# Patient Record
Sex: Male | Born: 1940 | Race: White | Hispanic: No | Marital: Married | Smoking: Former smoker
Health system: Southern US, Community
[De-identification: ages and names within clinical notes are randomized; demographics above are authoritative.]

## PROBLEM LIST (undated history)

## (undated) DIAGNOSIS — R339 Retention of urine, unspecified: Secondary | ICD-10-CM

## (undated) DIAGNOSIS — M7552 Bursitis of left shoulder: Secondary | ICD-10-CM

## (undated) DIAGNOSIS — R1900 Intra-abdominal and pelvic swelling, mass and lump, unspecified site: Secondary | ICD-10-CM

## (undated) DIAGNOSIS — I10 Essential (primary) hypertension: Secondary | ICD-10-CM

## (undated) DIAGNOSIS — M1990 Unspecified osteoarthritis, unspecified site: Secondary | ICD-10-CM

## (undated) DIAGNOSIS — C439 Malignant melanoma of skin, unspecified: Secondary | ICD-10-CM

## (undated) DIAGNOSIS — Z860100 Personal history of colon polyps, unspecified: Secondary | ICD-10-CM

## (undated) DIAGNOSIS — N390 Urinary tract infection, site not specified: Secondary | ICD-10-CM

## (undated) DIAGNOSIS — I639 Cerebral infarction, unspecified: Secondary | ICD-10-CM

## (undated) DIAGNOSIS — R3129 Other microscopic hematuria: Secondary | ICD-10-CM

## (undated) DIAGNOSIS — I472 Ventricular tachycardia, unspecified: Secondary | ICD-10-CM

## (undated) DIAGNOSIS — E785 Hyperlipidemia, unspecified: Secondary | ICD-10-CM

## (undated) DIAGNOSIS — G459 Transient cerebral ischemic attack, unspecified: Secondary | ICD-10-CM

## (undated) DIAGNOSIS — I509 Heart failure, unspecified: Secondary | ICD-10-CM

## (undated) DIAGNOSIS — N400 Enlarged prostate without lower urinary tract symptoms: Secondary | ICD-10-CM

## (undated) DIAGNOSIS — E119 Type 2 diabetes mellitus without complications: Secondary | ICD-10-CM

## (undated) DIAGNOSIS — R0609 Other forms of dyspnea: Secondary | ICD-10-CM

## (undated) DIAGNOSIS — R972 Elevated prostate specific antigen [PSA]: Secondary | ICD-10-CM

## (undated) HISTORY — DX: Heart failure, unspecified: I50.9

## (undated) HISTORY — DX: Essential (primary) hypertension: I10

## (undated) HISTORY — DX: Ventricular tachycardia: I47.2

## (undated) HISTORY — DX: Other forms of dyspnea: R06.09

## (undated) HISTORY — DX: Other microscopic hematuria: R31.29

## (undated) HISTORY — DX: Benign prostatic hyperplasia without lower urinary tract symptoms: N40.0

## (undated) HISTORY — DX: Malignant melanoma of skin, unspecified: C43.9

## (undated) HISTORY — DX: Type 2 diabetes mellitus without complications: E11.9

## (undated) HISTORY — DX: Hyperlipidemia, unspecified: E78.5

## (undated) HISTORY — DX: Transient cerebral ischemic attack, unspecified: G45.9

## (undated) HISTORY — DX: Personal history of colonic polyps: Z86.010

## (undated) HISTORY — PX: JOINT REPLACEMENT: SHX530

## (undated) HISTORY — DX: Personal history of colon polyps, unspecified: Z86.0100

## (undated) HISTORY — DX: Retention of urine, unspecified: R33.9

## (undated) HISTORY — DX: Bursitis of left shoulder: M75.52

## (undated) HISTORY — PX: REPLACEMENT TOTAL KNEE: SUR1224

## (undated) HISTORY — DX: Ventricular tachycardia, unspecified: I47.20

## (undated) HISTORY — PX: VASECTOMY: SHX75

## (undated) HISTORY — DX: Intra-abdominal and pelvic swelling, mass and lump, unspecified site: R19.00

## (undated) HISTORY — PX: CARDIOVERSION: SHX1299

## (undated) HISTORY — DX: Elevated prostate specific antigen (PSA): R97.20

## (undated) HISTORY — DX: Unspecified osteoarthritis, unspecified site: M19.90

---

## 2003-09-20 ENCOUNTER — Other Ambulatory Visit: Payer: Self-pay

## 2005-05-15 ENCOUNTER — Ambulatory Visit: Payer: Self-pay | Admitting: Internal Medicine

## 2005-05-24 ENCOUNTER — Ambulatory Visit: Payer: Self-pay | Admitting: Internal Medicine

## 2005-06-08 ENCOUNTER — Ambulatory Visit: Payer: Self-pay | Admitting: Internal Medicine

## 2005-06-19 ENCOUNTER — Ambulatory Visit: Payer: Self-pay | Admitting: Internal Medicine

## 2005-06-28 ENCOUNTER — Ambulatory Visit: Payer: Self-pay | Admitting: Internal Medicine

## 2005-07-16 ENCOUNTER — Ambulatory Visit: Payer: Self-pay | Admitting: Internal Medicine

## 2005-08-16 ENCOUNTER — Ambulatory Visit: Payer: Self-pay | Admitting: Internal Medicine

## 2005-11-05 ENCOUNTER — Ambulatory Visit: Payer: Self-pay | Admitting: Internal Medicine

## 2005-11-07 ENCOUNTER — Ambulatory Visit: Payer: Self-pay | Admitting: Internal Medicine

## 2005-11-13 ENCOUNTER — Ambulatory Visit: Payer: Self-pay | Admitting: Internal Medicine

## 2006-03-13 ENCOUNTER — Ambulatory Visit: Payer: Self-pay | Admitting: Internal Medicine

## 2006-03-14 ENCOUNTER — Ambulatory Visit: Payer: Self-pay | Admitting: Internal Medicine

## 2006-03-16 ENCOUNTER — Ambulatory Visit: Payer: Self-pay | Admitting: Internal Medicine

## 2006-04-30 ENCOUNTER — Other Ambulatory Visit: Payer: Self-pay

## 2006-04-30 ENCOUNTER — Inpatient Hospital Stay: Payer: Self-pay | Admitting: Internal Medicine

## 2006-07-02 ENCOUNTER — Ambulatory Visit: Payer: Self-pay | Admitting: Internal Medicine

## 2006-07-16 ENCOUNTER — Ambulatory Visit: Payer: Self-pay | Admitting: Internal Medicine

## 2006-10-15 ENCOUNTER — Ambulatory Visit: Payer: Self-pay | Admitting: Internal Medicine

## 2006-10-30 ENCOUNTER — Ambulatory Visit: Payer: Self-pay | Admitting: Internal Medicine

## 2006-11-14 ENCOUNTER — Ambulatory Visit: Payer: Self-pay | Admitting: Internal Medicine

## 2007-02-14 ENCOUNTER — Ambulatory Visit: Payer: Self-pay | Admitting: Internal Medicine

## 2007-02-26 ENCOUNTER — Ambulatory Visit: Payer: Self-pay | Admitting: Internal Medicine

## 2007-03-17 ENCOUNTER — Ambulatory Visit: Payer: Self-pay | Admitting: Internal Medicine

## 2007-06-16 ENCOUNTER — Ambulatory Visit: Payer: Self-pay | Admitting: Internal Medicine

## 2007-06-30 ENCOUNTER — Ambulatory Visit: Payer: Self-pay | Admitting: Internal Medicine

## 2007-07-04 ENCOUNTER — Ambulatory Visit: Payer: Self-pay | Admitting: Unknown Physician Specialty

## 2007-07-17 ENCOUNTER — Ambulatory Visit: Payer: Self-pay | Admitting: Internal Medicine

## 2007-12-15 ENCOUNTER — Ambulatory Visit: Payer: Self-pay | Admitting: Internal Medicine

## 2007-12-29 ENCOUNTER — Ambulatory Visit: Payer: Self-pay | Admitting: Internal Medicine

## 2008-01-14 ENCOUNTER — Ambulatory Visit: Payer: Self-pay | Admitting: Internal Medicine

## 2008-06-07 ENCOUNTER — Ambulatory Visit: Payer: Self-pay | Admitting: Internal Medicine

## 2008-07-06 ENCOUNTER — Ambulatory Visit: Payer: Self-pay | Admitting: Internal Medicine

## 2008-07-07 ENCOUNTER — Ambulatory Visit: Payer: Self-pay | Admitting: Internal Medicine

## 2008-07-16 ENCOUNTER — Ambulatory Visit: Payer: Self-pay | Admitting: Internal Medicine

## 2008-08-16 ENCOUNTER — Ambulatory Visit: Payer: Self-pay | Admitting: Internal Medicine

## 2008-12-14 ENCOUNTER — Ambulatory Visit: Payer: Self-pay | Admitting: Internal Medicine

## 2008-12-28 ENCOUNTER — Ambulatory Visit: Payer: Self-pay | Admitting: Internal Medicine

## 2008-12-31 ENCOUNTER — Ambulatory Visit: Payer: Self-pay | Admitting: Internal Medicine

## 2009-01-13 ENCOUNTER — Ambulatory Visit: Payer: Self-pay | Admitting: Internal Medicine

## 2009-06-15 ENCOUNTER — Ambulatory Visit: Payer: Self-pay | Admitting: Internal Medicine

## 2009-07-01 ENCOUNTER — Ambulatory Visit: Payer: Self-pay | Admitting: Internal Medicine

## 2009-07-16 ENCOUNTER — Ambulatory Visit: Payer: Self-pay | Admitting: Internal Medicine

## 2009-08-16 ENCOUNTER — Ambulatory Visit: Payer: Self-pay | Admitting: Internal Medicine

## 2009-12-14 ENCOUNTER — Ambulatory Visit: Payer: Self-pay | Admitting: Internal Medicine

## 2009-12-28 ENCOUNTER — Ambulatory Visit: Payer: Self-pay | Admitting: Internal Medicine

## 2010-01-13 ENCOUNTER — Ambulatory Visit: Payer: Self-pay | Admitting: Internal Medicine

## 2010-10-30 ENCOUNTER — Ambulatory Visit: Payer: Self-pay | Admitting: Dermatology

## 2010-11-05 ENCOUNTER — Inpatient Hospital Stay (HOSPITAL_COMMUNITY)
Admission: AD | Admit: 2010-11-05 | Discharge: 2010-11-08 | DRG: 282 | Disposition: A | Discharge status: Home / Self Care | Payer: Medicare Other | Source: Other Acute Inpatient Hospital | Attending: Cardiology | Admitting: Cardiology

## 2010-11-05 DIAGNOSIS — I4729 Other ventricular tachycardia: Principal | ICD-10-CM | POA: Diagnosis present

## 2010-11-05 DIAGNOSIS — N400 Enlarged prostate without lower urinary tract symptoms: Secondary | ICD-10-CM | POA: Diagnosis present

## 2010-11-05 DIAGNOSIS — I472 Ventricular tachycardia, unspecified: Principal | ICD-10-CM | POA: Diagnosis present

## 2010-11-05 DIAGNOSIS — M1990 Unspecified osteoarthritis, unspecified site: Secondary | ICD-10-CM | POA: Diagnosis present

## 2010-11-05 DIAGNOSIS — Z7982 Long term (current) use of aspirin: Secondary | ICD-10-CM

## 2010-11-05 DIAGNOSIS — I10 Essential (primary) hypertension: Secondary | ICD-10-CM | POA: Diagnosis present

## 2010-11-05 DIAGNOSIS — E785 Hyperlipidemia, unspecified: Secondary | ICD-10-CM | POA: Diagnosis present

## 2010-11-05 DIAGNOSIS — E119 Type 2 diabetes mellitus without complications: Secondary | ICD-10-CM | POA: Diagnosis present

## 2010-11-05 DIAGNOSIS — Z96659 Presence of unspecified artificial knee joint: Secondary | ICD-10-CM

## 2010-11-05 DIAGNOSIS — I214 Non-ST elevation (NSTEMI) myocardial infarction: Secondary | ICD-10-CM | POA: Diagnosis present

## 2010-11-05 DIAGNOSIS — R079 Chest pain, unspecified: Secondary | ICD-10-CM

## 2010-11-05 LAB — MRSA PCR SCREENING: MRSA by PCR: NEGATIVE

## 2010-11-05 LAB — CARDIAC PANEL(CRET KIN+CKTOT+MB+TROPI)
Relative Index: 3.4 — ABNORMAL HIGH (ref 0.0–2.5)
Troponin I: 0.3 ng/mL — ABNORMAL HIGH (ref 0.00–0.06)

## 2010-11-06 DIAGNOSIS — I059 Rheumatic mitral valve disease, unspecified: Secondary | ICD-10-CM

## 2010-11-06 LAB — COMPREHENSIVE METABOLIC PANEL
BUN: 11 mg/dL (ref 6–23)
CO2: 27 mEq/L (ref 19–32)
Calcium: 8.8 mg/dL (ref 8.4–10.5)
Chloride: 105 mEq/L (ref 96–112)
Creatinine, Ser: 0.84 mg/dL (ref 0.4–1.5)
GFR calc non Af Amer: >60 mL/min (ref >60)
Glucose, Bld: 122 mg/dL — ABNORMAL HIGH (ref 70–99)
Total Bilirubin: 0.9 mg/dL (ref 0.3–1.2)

## 2010-11-06 LAB — CBC
MCH: 32.5 pg (ref 26.0–34.0)
MCHC: 34.6 g/dL (ref 30.0–36.0)
MCV: 93.8 fL (ref 78.0–100.0)
Platelets: 132 10*3/uL — ABNORMAL LOW (ref 150–400)
RDW: 13.3 % (ref 11.5–15.5)
WBC: 6.4 10*3/uL (ref 4.0–10.5)

## 2010-11-06 LAB — GLUCOSE, CAPILLARY
Glucose-Capillary: 133 mg/dL — ABNORMAL HIGH (ref 70–99)
Glucose-Capillary: 154 mg/dL — ABNORMAL HIGH (ref 70–99)

## 2010-11-06 LAB — CARDIAC PANEL(CRET KIN+CKTOT+MB+TROPI)
CK, MB: 2.9 ng/mL (ref 0.3–4.0)
Relative Index: 2.8 — ABNORMAL HIGH (ref 0.0–2.5)
Total CK: 105 U/L (ref 7–232)
Troponin I: 0.23 ng/mL — ABNORMAL HIGH (ref 0.00–0.06)

## 2010-11-06 LAB — MAGNESIUM: Magnesium: 1.9 mg/dL (ref 1.5–2.5)

## 2010-11-07 ENCOUNTER — Inpatient Hospital Stay (HOSPITAL_COMMUNITY): Payer: Medicare Other

## 2010-11-07 LAB — BASIC METABOLIC PANEL
BUN: 11 mg/dL (ref 6–23)
CO2: 29 mEq/L (ref 19–32)
Calcium: 9.3 mg/dL (ref 8.4–10.5)
Chloride: 103 mEq/L (ref 96–112)
Creatinine, Ser: 0.89 mg/dL (ref 0.4–1.5)
GFR calc Af Amer: >60 mL/min (ref >60)

## 2010-11-07 LAB — GLUCOSE, CAPILLARY
Glucose-Capillary: 129 mg/dL — ABNORMAL HIGH (ref 70–99)
Glucose-Capillary: 92 mg/dL (ref 70–99)
Glucose-Capillary: 112 mg/dL — ABNORMAL HIGH (ref 70–99)

## 2010-11-07 LAB — CBC
MCH: 32.3 pg (ref 26.0–34.0)
MCHC: 34.7 g/dL (ref 30.0–36.0)
RDW: 13.2 % (ref 11.5–15.5)

## 2010-11-07 MED ORDER — GADOPENTETATE DIMEGLUMINE 469.01 MG/ML IV SOLN
45.0000 mL | Freq: Once | INTRAVENOUS | Status: AC
  Administered 2010-11-07: 45 mL via INTRAVENOUS

## 2010-11-08 LAB — GLUCOSE, CAPILLARY: Glucose-Capillary: 123 mg/dL — ABNORMAL HIGH (ref 70–99)

## 2010-11-08 NOTE — H&P (Signed)
Austin Warren, Austin Warren             ACCOUNT NO.:  [id number]  MEDICAL RECORD NO.:  [id number]           PATIENT TYPE:  I  LOCATION:  2912                         FACILITY:  [facility]  PHYSICIAN:  Austin Warren. Jens Som, MD, FACCDATE OF BIRTH:  05-Apr-1941  DATE OF ADMISSION:  11/05/2010 DATE OF DISCHARGE:                             HISTORY & PHYSICAL   The patient is a 70 year old male with past medical history of diabetes, hypertension, hyperlipidemia, benign prostatic hypertrophy, transferred from [facility] in [facility] for evaluation of ventricular tachycardia and chest pain.  He has no prior cardiac history.  Yesterday after fishing, the patient was walking up a hill.  He developed substernal chest pain which was described as indigestion.  The pain did not radiate.  It was not pleuritic.  There was associated lightheadedness, but there was no syncope.  There were no palpitations, nausea, or shortness of breath.  There was diaphoresis.  The pain persisted and his wife urged him to go to the emergency room, and when he arrived at [facility] he apparently was in ventricular tachycardia.  Note, I do not have those strips available or an electrocardiogram showing his ventricular tachycardia.  He had cardioversion and was placed on amiodarone.  Followup labs showed a mildly elevated troponin and he is now transferred for further evaluation and management.  Note, he is presently asymptomatic at present.  He denies any significant dyspnea on exertion, orthopnea, PND, or pedal edema.  He has no history of syncope.  His medications at the time of transfer include: 1. Cholecalciferol 1000 units by mouth daily. 2. Ascorbic acid 500 mg p.o. daily. 3. Aspirin 325 mg p.o. daily. 4. Finasteride 5 mg p.o. daily. 5. Fenofibrate 145 mg p.o. daily. 6. Enalapril 20 mg p.o. daily. 7. Flomax 0.4 mg p.o. daily. 8. Enoxaparin 100 mg subcu q.12 h. 9. Colace. 10.Amiodarone at 30 mg per  hour. 11.P.r.n. medications. 12.Metformin 500 mg p.o. b.i.d.  He has an allergy to CIPROFLOXACIN and LEVAQUIN.  SOCIAL HISTORY:  He does not smoke.  He states that he previously consumed 3-4 alcoholic beverages per day but has not consumed any in the past 3 months until yesterday.  His family history is significant for father who had a CVA.  There was no premature coronary disease by report.  His past medical history is significant for diabetes mellitus for approximately 6 months.  He has hypertension and hyperlipidemia.  He also has a history of polyps, benign prostatic hypertrophy with urinary tract infections, arthritis, melanoma which was removed from his arm, and he has had previous total knee replacement.  There is a question of an enlarged abdominal lymph node.  REVIEW OF SYSTEMS:  He denies any headaches or fevers or chills.  There is no productive cough or hemoptysis.  There is no dysphagia, odynophagia, melena, or hematochezia.  There is no dysuria or hematuria. There is no rash or seizure activity.  There is no orthopnea, PND, or pedal edema.  Remaining systems are negative.  Physical examination shows a pulse of 73.  His blood pressure is 115/52. He is well developed and well nourished, no  acute distress.  His skin is warm and dry.  He does not appear to be depressed.  There is no peripheral clubbing.  His back is normal.  His HEENT is normal, normal eyelids.  His neck is supple with normal upstrokes bilaterally.  No bruits heard.  There is no jugular venous distention.  I cannot appreciate thyromegaly.  His chest is clear to auscultation, normal expansion.  His cardiovascular exam reveals a regular rhythm.  Normal S1 and S2.  There are no murmurs, rubs, or gallops noted.  Abdominal exam is nontender, nondistended.  Positive bowel sounds.  No hepatosplenomegaly.  No masses appreciated.  There is no abdominal bruit.  He has 2+ femoral pulses bilaterally.  No bruits.   Extremities show no edema.  I can palpate no cords.  He has 2+ dorsalis pedis pulses bilaterally.  Neurologic exam is grossly intact.  Laboratories from the outside hospital show a sodium of 138 with potassium of 4.0.  BUN and creatinine are 18 and 0.9.  White blood cell count is 8 with a hemoglobin of 15.4, hematocrit of 45.6.  His platelet count is 157.  His D-dimer was normal.  His troponin-I is mildly elevated at 0.027, 0.591, and 0.522.  An electrocardiogram from this morning showed normal sinus rhythm with no ST changes.  Chest x-ray showed cardiac enlargement.  DIAGNOSES: 1. Chest pain - the patient presented to with an episode of     ventricular tachycardia by report requiring cardioversion as well     as chest pain.  His troponin-I is mildly increased, but certainly     this could be related to his cardioversion.  However, given his     ventricular arrhythmias and multiple risk factors he will require     cardiac catheterization.  The risk and benefits have been discussed     and the patient agrees to proceed.  We will also check an     echocardiogram to quantify LV function as well as check     electrolytes given his ventricular cardia.  He will be treated with     aspirin, beta blockade, and he will continue on his amiodarone and     Lovenox.  I will add a statin as well. 2. Ventricular tachycardia - he will continue on his amiodarone, and     we will add low-dose beta blockade.  We need his electrocardiogram     and strips from [facility] showing his ventricular     tachycardia for further identification of his rhythm. 3. Diabetes mellitus - we will check CBCs.  Given his upcoming     catheterization, his metformin will be placed on hold. 4. Hypertension - we will follow his blood pressure and adjust his     regimen as indicated. 5. Benign prostatic hypertrophy.     Austin Warren Jens Som, MD, [facility]     BSC/MEDQ  D:  11/05/2010  T:  11/05/2010  Job:   [id number]  Electronically Signed by [name] MD [facility] on 11/08/2010 01:35:18 PM

## 2010-11-12 ENCOUNTER — Observation Stay: Payer: Self-pay | Admitting: Internal Medicine

## 2010-11-16 NOTE — Cardiovascular Report (Signed)
Austin Warren, Austin Warren             ACCOUNT NO.:  [id number]  MEDICAL RECORD NO.:  [id number]           PATIENT TYPE:  LOCATION:                                 FACILITY:  PHYSICIAN:  Veverly Fells. Excell Seltzer, MD  DATE OF BIRTH:  [birth year]  DATE OF PROCEDURE:  11/06/2010 DATE OF DISCHARGE:                           CARDIAC CATHETERIZATION   PROCEDURES: 1. Left heart catheterization. 2. Selective coronary angiography. 3. Left ventricular angiography.  PROCEDURAL INDICATION:  Mr. [name] is a 70 year old gentleman who presented with chest pain and was in ventricular tachycardia.  He had elevated troponin following defibrillation.  He was initially cardioverted but then degenerated into V-fib and required defibrillation.  The patient has multiple cardiac risk factors and because of presentation with VT, he was referred for cardiac cath to rule out obstructive CAD and to rule out an ischemic substrate.  Risks and indications of the procedure were reviewed with the patient. Informed consent was obtained.  The right wrist was prepped, draped, and anesthetized with 1% lidocaine.  Using the modified Seldinger technique, a 5-French sheath was placed in the right radial artery.  Standard Judkins catheters were used for coronary angiography and left ventriculography.  I initially attempted to use a TIG catheter and I was able to image the left coronary artery, but I could not engage the right coronary artery with that catheter.  A JR-4 was utilized.  A pigtail catheter was used for left ventriculography.  The patient tolerated the procedure well.  There were no immediate complications.  A TR band was used for radial hemostasis.  PROCEDURAL FINDINGS:  Aortic pressure 133/70 with a mean of 96, left ventricular pressure of 128/21.  Left ventriculography shows low normal left ventricular systolic function.  The estimated left ventricular ejection fraction is 50-55%. There is no significant  mitral regurgitation. Coronary angiography:  Left mainstem is angiographically normal and divides into the LAD and left circumflex.  LAD:  The LAD reaches the left ventricular apex.  The vessel supplies a moderate-caliber diagonal branch with no obstructive disease.  The LAD proper also has no obstructive disease.  There is a moderate ramus intermedius branch with no obstructive disease.  The AV groove circumflex is fairly small and supplies two OM branches.  There is no obstructive disease in the left circumflex.  Right coronary artery:  The RCA is a large, dominant vessel.  The vessel was widely patent throughout its course.  The origin of a large posterolateral branch has minor nonobstructive stenosis but the vessel is widely patent throughout, the PDA is widely patent as well.  FINAL ASSESSMENT: 1. Widely patent coronary arteries with minimal nonobstructive disease     of the posterolateral branch. 2. Low normal left ventricular systolic function.  RECOMMENDATIONS:  The patient will have an EP evaluation for definitive treatment of his ventricular tachycardia.  He does not have significant CAD.     Veverly Fells. Excell Seltzer, MD     MDC/MEDQ  D:  11/06/2010  T:  11/07/2010  Job:  [id number]  cc:   Madolyn Frieze. Jens Som, MD, [facility]  Electronically Signed by Tonny Bollman MD on 11/16/2010 10:39:07  AM

## 2010-12-01 ENCOUNTER — Ambulatory Visit (INDEPENDENT_AMBULATORY_CARE_PROVIDER_SITE_OTHER): Payer: Medicare Other | Admitting: Internal Medicine

## 2010-12-01 ENCOUNTER — Encounter: Payer: Self-pay | Admitting: Internal Medicine

## 2010-12-01 DIAGNOSIS — I472 Ventricular tachycardia: Secondary | ICD-10-CM

## 2010-12-01 DIAGNOSIS — R9431 Abnormal electrocardiogram [ECG] [EKG]: Secondary | ICD-10-CM | POA: Insufficient documentation

## 2010-12-01 NOTE — Progress Notes (Signed)
HPI: Austin Warren is a 70 y.o. male Seen in followup for a ventricular tachycardia with a right bundle superior axis relatively narrow QRS complex and positive concordance suggesting a septal origin and possible verapamil sensitivity. Catheterization had demonstrated normal coronary arteries and normal left ventricular function. Signal average Electrocardiogram was markedly abnormal.MRI was normal.  We initially tried him on a beta blocker prescription. He had recurrent ventricular tachycardia. He was put on verapamil. Plans were made for referral to Dr. Johney Frame for catheter ablation. Something happened and that never happened.  However, the patient has had no recurrent VT  He also notes that he had significant alcohol prior to both of these events. He has been abstaining since and has had no more VT Current Outpatient Prescriptions  Medication Sig Dispense Refill  . Ascorbic Acid (VITAMIN C) 500 MG tablet Take 500 mg by mouth daily.        Marland Kitchen aspirin 81 MG tablet Take 81 mg by mouth daily.        . Cholecalciferol (VITAMIN D3) 1000 UNITS CAPS Take 1 capsule by mouth daily.        . enalapril (VASOTEC) 20 MG tablet Take 10 mg by mouth daily.        . fenofibrate (TRICOR) 145 MG tablet Take 145 mg by mouth daily.        . finasteride (PROSCAR) 5 MG tablet Take 5 mg by mouth daily.        . metFORMIN (GLUCOPHAGE) 500 MG tablet Take 500 mg by mouth 2 (two) times daily with a meal.        . Tamsulosin HCl (FLOMAX) 0.4 MG CAPS Take 0.4 mg by mouth daily.        . verapamil (CALAN-SR) 180 MG CR tablet Take 180 mg by mouth at bedtime.        Marland Kitchen DISCONTD: aspirin EC 325 MG EC tablet Take 325 mg by mouth daily.        Marland Kitchen DISCONTD: metoprolol (TOPROL-XL) 50 MG 24 hr tablet Take 50 mg by mouth daily. Start after atenolol for 2 weeks       . DISCONTD: propranolol (INDERAL LA) 80 MG 24 hr capsule Take 80 mg by mouth daily. X 14 days. Start after Toprol         Allergies  Allergen Reactions  . Levaquin  Anaphylaxis    "throat closes"  . Ciprofloxacin     Past Medical History  Diagnosis Date  . Diabetes mellitus   . Hypertension   . Hyperlipidemia   . History of colon polyps   . Benign prostatic hypertrophy   . Arthritis   . Ventricular tachycardia   . Melanoma     under arm  . Osteoarthritis     Past Surgical History  Procedure Date  . Replacement total knee     Family History  Problem Relation Age of Onset  . Stroke Father     History   Social History  . Marital Status: Married    Spouse Name: N/A    Number of Children: N/A  . Years of Education: N/A   Occupational History  . Not on file.   Social History Main Topics  . Smoking status: Never Smoker   . Smokeless tobacco: Never Used  . Alcohol Use: 0.0 oz/week    3-4 Cans of beer per week  . Drug Use: No  . Sexually Active:    Other Topics Concern  . Not on file  Social History Narrative  . No narrative on file    Fourteen point review of systems was negative except as noted in HPI and PMH   PHYSICAL EXAMINATION  Blood pressure 120/60, pulse 65, height 5\' 7"  (1.702 m), weight 216 lb (97.977 kg).   Well developed and obese Caucasian male in no acute distress HENT normal Neck supple with JVP-flat Carotids brisk and full without bruits Back without scoliosis or kyphosis Clear Regular rate and rhythm, no murmurs or gallops Abd-soft with active BS without hepatomegaly or midline pulsation Femoral pulses 2+ distal pulses intact No Clubbing cyanosis edema Skin-warm and clammy A & Oriented CN 3-12 normal   Grossly normal sensory and motor function Affect engaging .  Sinus rhythm at 65 Intervals 0.21/0.10/0.38 Axis is 65 Isolated PVC

## 2010-12-01 NOTE — Assessment & Plan Note (Signed)
The patient has had recurrent ventricular tachycardia on beta blockers but none since initiating verapamil and abstaining from alcohol. We discussed alternatives including proceed with catheter ablation or see how he does on the verapamil. He would like to do the latter and I think that that is reasonable. We'll see him again in 3 months time. He will continue to abstain from alcohol

## 2010-12-01 NOTE — Assessment & Plan Note (Signed)
This is the only evidence that we have structural disease. It does bother me. However, apart from falling it or something else that I would do a partial his MRI and his catheterization

## 2010-12-01 NOTE — Patient Instructions (Signed)
Your physician recommends that you schedule a follow-up appointment in: Change 6/4 appt to end of Sept. 2012

## 2010-12-04 ENCOUNTER — Telehealth: Payer: Self-pay | Admitting: Internal Medicine

## 2010-12-04 NOTE — Telephone Encounter (Signed)
verapamil 180 mg , uses [facility] in [place]

## 2010-12-05 MED ORDER — VERAPAMIL HCL 180 MG PO TBCR: 180.0000 mg | EXTENDED_RELEASE_TABLET | Freq: Every day | ORAL | Status: DC

## 2010-12-05 NOTE — Discharge Summary (Signed)
NAMEJOSHIAH, [name] NO.:  [id number]  MEDICAL RECORD NO.:  [id number]           PATIENT TYPE:  I  LOCATION:  2912                         FACILITY:  [facility]  PHYSICIAN:  Duke Salvia, MD, FACCDATE OF BIRTH:  07-Dec-1940  DATE OF ADMISSION:  11/05/2010 DATE OF DISCHARGE:  11/08/2010                              DISCHARGE SUMMARY   PRIMARY CARDIOLOGIST/ELECTROPHYSIOLOGIST:  Duke Salvia, MD, [facility]  PRIMARY CARE PHYSICIAN:  Delila Pereyra, MD, [facility], [place]  DISCHARGE DIAGNOSES: 1. Ventricular tachycardia.     a.     Widely patent coronaries per cardiac catheterization, November 06, 2010.  Low normal left ventricular systolic function per echo      and cardiac catheterization, normal cardiac MRI, abnormal signal      average ECG suggesting VT origin near annulus given precordial      positive concordance and negative in inferior leads.     b.     Beta blockade trial x3 (atenolol 50 mg p.o. daily x2 weeks,      Toprol 50 mg x2 weeks, Inderal LA 80 mg x2 weeks).     c.     Follow up with Dr. Graciela Husbands, [facility]      office on December 18, 2010 at 2:45 p.m. 2. Non-ST-elevation myocardial infarction secondary to ventricular     tachycardia.     a.     Widely patent coronaries on cardiac catheterization this      admission.  SECONDARY DIAGNOSES: 1. Non-insulin-dependent diabetes mellitus. 2. Hypertension. 3. Benign prostatic hypertrophy. 4. Hyperlipidemia. 5. Arthritis. 6. History of melanoma.     a.     Excision from arm. 7. Osteoarthritis.     a.     Status post total knee arthroplasty.  ALLERGIES AND INTOLERANCES:  LEVOFLOXACIN  (throat closes).  PROCEDURES: 1. EKG, November 05, 2010:  Normal sinus rhythm with no significant ST     changes. 2. Chest x-ray, November 05, 2010:  Cardiac enlargement without acute     processes. 3. 2-D echocardiogram, November 06, 2010:  Left ventricular cavity size     normal, mild left ventricular  hypertrophy, left ventricular     ejection fraction 55-60% with normal wall motion and grade 2     diastolic dysfunction.  Calcified mitral annulus.  Mild-to-moderate     left atrial enlargement.  Pulmonary artery peak pressure 49 mmHg. 4. Cardiac catheterization, November 06, 2010:  Widely patent coronary     arteries with minimal nonobstructive disease of posterolateral     branch.  Low normal left ventricular systolic function. 5. Cardiac MRI, November 07, 2010:  Mild left ventricular hypertrophy     with mild left ventricular enlargement.  Mild posterolateral wall     hypokinesis, no discrete regional wall motion abnormalities,     ejection fraction low normal at 51%.  No hyper enhancement, scar or     infiltration.  Moderate left atrial enlargement.  Normal right     ventricular size and function. 6. Signal average ECG:  Please see discharge diagnoses #1, subsection  A.  HISTORY OF PRESENT ILLNESS:  Austin Warren is a 70 year old Caucasian gentleman with the above-noted medical history who presented to [facility] at [facility] for evaluation of chest discomfort, subsequently found to be in ventricular tachycardia.  The patient was in his usual state of health until the date of his presentation when he was walking uphill after fishing and he developed substernal chest discomfort.  He initially thought this was indigestion but as it continued, his wife eventually persuaded him to present to the [facility] ED for further evaluation.  There he was noted to be in ventricular tachycardia and underwent DC CV to ventricular fibrillation requiring a second shock to normal sinus rhythm.  He was initiated on amiodarone therapy and transferred to [facility] for further evaluation.  The patient denies radiation of his chest discomfort, no pleuritic nature.  He was mildly lightheaded but no syncope, no palpitations, no nausea, no shortness of breath, and no diaphoresis.  He denies  dyspnea on exertion, orthopnea, PND, pedal edema, and has no history of syncope.  HOSPITAL COURSE:  The patient was admitted and continued on amiodarone therapy.  Cardiac enzymes were cycled which showed mild elevation of troponin and MB.  The patient underwent cardiac catheterization as well as 2-D echocardiogram on November 06, 2010 without significant findings. Electrophysiology was consulted and cardiac MRI and signal average ECG were completed.  MRI was negative for significant findings in regards to his ventricular tachycardia but signal average ECG suggested location of ventricular tachycardia to be near annulus given positive QRS complexes in precordial leads and negative in inferior leads.  Amiodarone therapy was discontinued and although verapamil was considered given the patient's right bundle-branch block, a trial of 3 different beta- blockers will be initiated outpatient and then the patient will follow up with his new cardiologist/electrophysiologist, Dr. Sherryl Manges.  The patient has been given 3 separate prescriptions to be taken in 2-week period, please see discharge medical section.  In order to allow forsufficient blood pressure, the patient's ACE inhibitor was decreased. The patient will follow up with his primary care provider regarding the question of why he is currently on a full strength aspirin, especially given his new diagnosis of near clean cardiac cath.  At the time of discharge, the patient was given his new medication list with extensive instructions on how to take beta-blockade therapy, prescriptions, followup instructions, postcath instructions, and all questions and concerns were addressed prior to his leaving the hospital.  DISCHARGE LABORATORY DATA:  WBC is 7.6, HGB 14.2, HCT 40.9, and PLT count is 133.  Pro-time 13.8 and INR 1.04.  Sodium 140, potassium 4.4, chloride 103, bicarb 29, BUN 11, creatinine 0.89, and glucose ranged from 92-154 this admission.   Liver function tests within normal limits. Magnesium 1.9.  First full set of cardiac enzymes, CK 119, MB 4.1 with relative index of 3.4, and troponin of 0.30.  Second full set, CK 105, MB 2.9 with relative index of 2.8, and troponin of 0.23.  FOLLOWUP PLANS AND APPOINTMENTS:  Dr. Sherryl Manges, [facility], [facility] office, December 18, 2010 at 2:45 p.m.  DISCHARGE MEDICATIONS: 1. Atenolol 50 mg p.o. daily x14 days. 2. Toprol-XL 50 mg 1 tablet p.o. daily x2 weeks (start after     atenolol). 3. Inderal LA 80 mg p.o. daily x14 days (start after Toprol). 4. Enalapril 20 mg 1/2 tablet p.o. daily. 5. Aspirin 325 mg 1 tablet p.o. daily (discuss with primary care     physician, question  indication?). 6. Finasteride 5 mg 1 tablet p.o. daily. 7. Flomax 0.4 mg 1 capsule p.o. daily. 8. Metformin 500 mg 1 tablet p.o. b.i.d. 9. TriCor 145 mg 1 tablet p.o. daily. 10.Vitamin C 500 mg 1 tablet p.o. daily. 11.Vitamin D3 1000 units 1 tablet daily.  DURATION OF DISCHARGE ENCOUNTER INCLUDING PHYSICIAN TIME:  35 minutes.     Jarrett Ables, PAC   ______________________________ Duke Salvia, MD, [facility]    MS/MEDQ  D:  11/08/2010  T:  11/09/2010  Job:  [id number]  cc:   Delila Pereyra, M.D.  Electronically Signed by Jarrett Ables PAC on 11/10/2010 02:37:26 PM Electronically Signed by Sherryl Manges MD [facility] on 12/05/2010 04:23:38 PM

## 2010-12-18 ENCOUNTER — Encounter: Payer: Medicare Other | Admitting: Internal Medicine

## 2011-01-15 ENCOUNTER — Ambulatory Visit: Payer: Self-pay | Admitting: Internal Medicine

## 2011-02-01 ENCOUNTER — Telehealth: Payer: Self-pay | Admitting: Internal Medicine

## 2011-02-01 NOTE — Telephone Encounter (Signed)
pts wife rtn call to debra re an appt with allred, they have been out of town and just got back today, he has an appt with dr Graciela Husbands in [place] 7-24 and wasn't sure why or when he needed to see allred

## 2011-02-01 NOTE — Telephone Encounter (Signed)
Patient's wife said [facility] Kidney had left a message regarding pt needing an appointment with Dr. Johney Frame for his arrhythmias and needing a procedure done. Pt's wife would like to know if pt needs to see Dr. Johney Frame before his appointment with Dr. Graciela Husbands on 02/04/11 at 9:15 PM in [place].

## 2011-02-05 NOTE — Telephone Encounter (Signed)
Per Dr. Graciela Husbands, f/u with Dr. Johney Frame regarding VT. Dr. Graciela Husbands called and spoke with Dr. Johney Frame about this patient. Appt is 02/15/11 with Dr. Johney Frame at 2:30pm. Patient verbalizes understanding.

## 2011-02-06 ENCOUNTER — Encounter: Payer: Medicare Other | Admitting: Internal Medicine

## 2011-02-14 ENCOUNTER — Ambulatory Visit: Payer: Self-pay | Admitting: Internal Medicine

## 2011-02-15 ENCOUNTER — Encounter: Payer: Self-pay | Admitting: Internal Medicine

## 2011-02-15 ENCOUNTER — Ambulatory Visit (INDEPENDENT_AMBULATORY_CARE_PROVIDER_SITE_OTHER): Payer: Medicare Other | Admitting: Internal Medicine

## 2011-02-15 ENCOUNTER — Encounter: Payer: Self-pay | Admitting: *Deleted

## 2011-02-15 DIAGNOSIS — Z01810 Encounter for preprocedural cardiovascular examination: Secondary | ICD-10-CM

## 2011-02-15 DIAGNOSIS — I472 Ventricular tachycardia: Secondary | ICD-10-CM

## 2011-02-15 MED ORDER — METOPROLOL TARTRATE 25 MG PO TABS: 12.5000 mg | ORAL_TABLET | Freq: Two times a day (BID) | ORAL | Status: DC

## 2011-02-15 NOTE — Patient Instructions (Signed)
The current medical regimen is effective;  continue present plan and medications.  You have been scheduled for a VT Ablation with Dr Hillis Range on August 29 th at 8:30 am.  Please follow the instruction sheet given.

## 2011-02-15 NOTE — Progress Notes (Signed)
Austin Warren is a pleasant 70 y.o. WM patient with a h/o ideopathic ventricular tachycardia who presents today for further evaluation.  He was initially diagnosed with VT 4/12 after presenting to [facility] in [place] with sustained tachypalpitations and dizziness.  He required cardioversion and was placed on amiodarone.  He was brought to [facility] where he had further workup including cath and cardiac MRI.   Catheterization demonstrated normal coronary arteries and normal left ventricular function. Cardiac MRI was normal. His EKG was reviewed and revealed a RBB/LAHB QRS VT morphology with a relatively narrow QRS.  This was felt to represent ideopathic VT and the patient was treated with verapamil.   He reports doing very well with verapamil until developed recurrent symptomatic VT while on a business trip with his spouse in [place].  He was evaluated at [facility] and again found to have VT.  This VT was felt to be fascicular in etiology.  The patient declined ablation (which was recommended) and was therefore discharged.  He has done well without further VT since that time.  Today, he denies symptoms of palpitations, chest pain, shortness of breath, orthopnea, PND, lower extremity edema, dizziness, presyncope, syncope, or neurologic sequela. The patient is tolerating medications without difficulties and is otherwise without complaint today.   Past Medical History  Diagnosis Date  . Diabetes mellitus   . Hypertension   . Hyperlipidemia   . History of colon polyps   . Benign prostatic hypertrophy   . Arthritis   . Ventricular tachycardia     RBB/LAHB ideopathic VT  . Melanoma     under arm  . Osteoarthritis    Past Surgical History  Procedure Date  . Replacement total knee     Current Outpatient Prescriptions  Medication Sig Dispense Refill  . amoxicillin (AMOXIL) 875 MG tablet As directed for infection      . Ascorbic Acid (VITAMIN C) 500  MG tablet Take 500 mg by mouth daily.        Marland Kitchen aspirin 81 MG tablet Take 81 mg by mouth daily.        . Cholecalciferol (VITAMIN D3) 1000 UNITS CAPS Take 1 capsule by mouth daily.        . enalapril (VASOTEC) 20 MG tablet Take 10 mg by mouth daily.        . fenofibrate (TRICOR) 145 MG tablet Take 145 mg by mouth daily.        . finasteride (PROSCAR) 5 MG tablet Take 5 mg by mouth daily.        . metFORMIN (GLUCOPHAGE) 500 MG tablet Take 500 mg by mouth 2 (two) times daily with a meal.        . metoprolol tartrate (LOPRESSOR) 25 MG tablet Take 12.5 mg by mouth 2 (two) times daily.        . multivitamin (THERAGRAN) per tablet Take 1 tablet by mouth daily.        . Tamsulosin HCl (FLOMAX) 0.4 MG CAPS Take 0.4 mg by mouth daily.        . verapamil (CALAN-SR) 180 MG CR tablet Take 1 tablet (180 mg total) by mouth at bedtime.  30 tablet  12    Allergies  Allergen Reactions  . Levaquin Anaphylaxis    "throat closes"  . Ciprofloxacin     History   Social History  . Marital Status: Married    Spouse Name: N/A    Number of Children: N/A  .  Years of Education: N/A   Occupational History  . Not on file.   Social History Main Topics  . Smoking status: Former Games developer  . Smokeless tobacco: Never Used  . Alcohol Use: 0.0 oz/week    3-4 Cans of beer per week     prior heavy  . Drug Use: No  . Sexually Active: Not on file   Other Topics Concern  . Not on file   Social History Narrative   retired    Family History  Problem Relation Age of Onset  . Stroke Father     ROS- All systems are reviewed and negative except as per the HPI above  Physical Exam: Filed Vitals:   02/15/11 1423  BP: 110/64  Pulse: 67  Height: 5\' 7"  (1.702 m)  Weight: 208 lb (94.348 kg)    GEN- The patient is well appearing, alert and oriented x 3 today.   Head- normocephalic, atraumatic Eyes-  Sclera clear, conjunctiva pink Ears- hearing intact Oropharynx- clear Neck- supple, no JVP Lymph- no  cervical lymphadenopathy Lungs- Clear to ausculation bilaterally, normal work of breathing Heart- Regular rate and rhythm, no murmurs, rubs or gallops, PMI not laterally displaced GI- soft, NT, ND, + BS Extremities- no clubbing, cyanosis, or edema MS- no significant deformity or atrophy Skin- no rash or lesion Psych- euthymic mood, full affect Neuro- strength and sensation are intact  Assessment and Plan:

## 2011-02-15 NOTE — Assessment & Plan Note (Addendum)
The patient has been docuemented to have RBB/LAHB VT with a relatively narrow QRS.  Unfortunately, his EKGs from [facility] are not available in [facility] for review today.  By description, he has ideopathic and likely fascicular/ papillary muscle VT. Therapeutic strategies for VT including medicine and ablation were discussed in detail with the patient today. Risk, benefits, and alternatives to EP study and radiofrequency ablation were also discussed in detail today. These risks include but are not limited to stroke, bleeding, vascular damage, tamponade, perforation, damage to the heart and other structures,AV block requiring pacemaker, worsening renal function, and death. The patient understands these risk and wishes to proceed.  We will therefore proceed with catheter ablation with anesthesia and carto when our schedule will allow. I have instructed the patient to hold verapamil and metoprolol for 72 hours prior to ablation.

## 2011-03-12 ENCOUNTER — Other Ambulatory Visit (INDEPENDENT_AMBULATORY_CARE_PROVIDER_SITE_OTHER): Payer: Medicare Other | Admitting: *Deleted

## 2011-03-12 DIAGNOSIS — I472 Ventricular tachycardia, unspecified: Secondary | ICD-10-CM

## 2011-03-12 DIAGNOSIS — Z01810 Encounter for preprocedural cardiovascular examination: Secondary | ICD-10-CM

## 2011-03-12 LAB — BASIC METABOLIC PANEL
CO2: 26 mEq/L (ref 19–32)
Chloride: 101 mEq/L (ref 96–112)
Potassium: 4.3 mEq/L (ref 3.5–5.1)
Sodium: 138 mEq/L (ref 135–145)

## 2011-03-12 LAB — CBC WITH DIFFERENTIAL/PLATELET
Basophils Absolute: 0 10*3/uL (ref 0.0–0.1)
Eosinophils Absolute: 0.2 10*3/uL (ref 0.0–0.7)
Hemoglobin: 15.5 g/dL (ref 13.0–17.0)
Lymphocytes Relative: 25.2 % (ref 12.0–46.0)
MCHC: 34 g/dL (ref 30.0–36.0)
Monocytes Relative: 8 % (ref 3.0–12.0)
Neutro Abs: 3.3 10*3/uL (ref 1.4–7.7)
Neutrophils Relative %: 61.9 % (ref 43.0–77.0)
RBC: 4.77 Mil/uL (ref 4.22–5.81)
RDW: 14.4 % (ref 11.5–14.6)

## 2011-03-12 LAB — APTT: aPTT: 26.9 s (ref 21.7–28.8)

## 2011-03-12 LAB — PROTIME-INR: INR: 1 ratio (ref 0.8–1.0)

## 2011-03-13 ENCOUNTER — Telehealth: Payer: Self-pay | Admitting: *Deleted

## 2011-03-13 NOTE — Telephone Encounter (Signed)
Please note platelets--are these too low to proceed with ablation ?--they have come up from last draw--nt

## 2011-03-14 ENCOUNTER — Ambulatory Visit (HOSPITAL_COMMUNITY)
Admission: RE | Admit: 2011-03-14 | Discharge: 2011-03-14 | Disposition: A | Discharge status: Home / Self Care | Payer: Medicare Other | Source: Ambulatory Visit | Attending: Internal Medicine | Admitting: Internal Medicine

## 2011-03-14 ENCOUNTER — Other Ambulatory Visit: Payer: Medicare Other | Admitting: *Deleted

## 2011-03-14 DIAGNOSIS — I472 Ventricular tachycardia, unspecified: Secondary | ICD-10-CM | POA: Insufficient documentation

## 2011-03-14 DIAGNOSIS — I10 Essential (primary) hypertension: Secondary | ICD-10-CM | POA: Insufficient documentation

## 2011-03-14 DIAGNOSIS — N400 Enlarged prostate without lower urinary tract symptoms: Secondary | ICD-10-CM | POA: Insufficient documentation

## 2011-03-14 DIAGNOSIS — M1990 Unspecified osteoarthritis, unspecified site: Secondary | ICD-10-CM | POA: Insufficient documentation

## 2011-03-14 DIAGNOSIS — I4729 Other ventricular tachycardia: Secondary | ICD-10-CM | POA: Insufficient documentation

## 2011-03-14 DIAGNOSIS — E119 Type 2 diabetes mellitus without complications: Secondary | ICD-10-CM | POA: Insufficient documentation

## 2011-03-14 LAB — GLUCOSE, CAPILLARY
Glucose-Capillary: 130 mg/dL — ABNORMAL HIGH (ref 70–99)
Glucose-Capillary: 118 mg/dL — ABNORMAL HIGH (ref 70–99)

## 2011-03-22 NOTE — Op Note (Signed)
NAMEARTRELL, LAWLESS NO.:  [id number]  MEDICAL RECORD NO.:  [id number]  LOCATION:  [facility]                         FACILITY:  [facility]  PHYSICIAN:  Hillis Range, MD       DATE OF BIRTH:  09-17-1940  DATE OF PROCEDURE: DATE OF DISCHARGE:                              OPERATIVE REPORT   SURGEON:  Hillis Range, MD  PREPROCEDURE DIAGNOSIS:  Ventricular tachycardia.  POSTPROCEDURE DIAGNOSIS:  Ventricular tachycardia.  PROCEDURES: 1. Comprehensive electrophysiologic study. 2. Coronary sinus pacing and recording. 3. Arterial blood pressure monitoring. 4. Arrhythmia induction with isoproterenol infused.  INTRODUCTION:  Mr. Nazareno is a very pleasant 70 year old gentleman with a history of idiopathic ventricular tachycardia who presents today for EP study and radiofrequency ablation.  He initially developed a right bundle-branch left anterior hemiblock QRS morphology ventricular tachycardia with a narrow QRS in April 2012.  A cardiac MRI revealed no evidence of structural heart disease.  He was felt to have probably verapamil sensitive ventricular tachycardia.  He was placed on verapamil and did very well.  Unfortunately, he subsequently developed recurrent ventricular tachycardia recently while on vacation in [place].  He therefore was placed on metoprolol in addition to his verapamil.  He has done well since that time.  He presents today for EP study and radiofrequency ablation.  DESCRIPTION OF PROCEDURE:  Informed written consent was obtained and the patient was brought to the electrophysiology lab after a 3-day washout of metoprolol and verapamil.  He was adequately sedated with intravenous medications as outlined in the anesthesia report.  His right groin was prepped and draped in the usual sterile fashion by the EP lab staff. Using a percutaneous Seldinger technique, one 6, one 7 and one 8-French hemostasis sheaths were placed in the right common femoral  vein.  An 8- [place] hemostasis sheath was placed in the right common femoral artery for blood pressure monitoring.  A 7-French Biosense Webster decapolar coronary sinus catheter was introduced through the right common femoral vein and advanced into the coronary sinus for recording and pacing from this location.  A 6-French quadripolar Josephson catheter was introduced through the right common femoral vein and advanced into the right ventricle for recording and pacing.  This catheter was then pulled back to the His bundle location.  The patient presented to the electrophysiology lab in sinus rhythm.  His AH interval measured 90 milliseconds with an HV interval of 58 milliseconds.  His PR interval was 199 milliseconds with a QRS duration of 94 milliseconds and a QT interval of 368 milliseconds.  Ventricular pacing was performed which revealed decremental concentric VA conduction with a VA block cycle length of 430 milliseconds with no arrhythmias observed.  Atrial pacing was then performed which revealed decremental AV conduction with an AVWenckebach cycle length of 440 milliseconds.  There was no evidence of PR greater than RR and no tachycardias were observed.  Atrial extra stimulus testing was then performed which revealed decremental AV conduction with an AH jump but no echo beats and no tachycardias observed.  The AV nodal ERP was 500/350 milliseconds.  This maneuver was repeated multiple times, however, despite an AH jump, there were no echo beats and tachycardia  could not be induced.  Rapid atrial pacing was again performed which revealed no evidence of PR greater than RR and no arrhythmias observed.  Ventricular pacing was then performed from the right ventricular base down to a cycle length of 260 milliseconds with no arrhythmias observed.  Ventricular extra stimulus testing was performed which revealed decremental AV conduction with no retrograde jumps, echo beats and no  arrhythmias observed.  Programmed extra stimulus testing was performed from the right ventricular base with a basic cycle length of 500 milliseconds with S1, S2, S3, S4 extrastimuli down to refractoriness (500/240/230/220 milliseconds) with no inducible tachycardia.  Programmed extrastimulus testing was repeated with a basic cycle length of 450 milliseconds with S1, S2, S3, S4 extrastimuli down to refractoriness (450/280/260/200 milliseconds) with no inducible ventricular tachycardia.  The quadripolar catheter was then advanced into the right ventricular apex.  In this location, programmed extrastimulus testing was again performed with a basic cycle length of 500 milliseconds down to S1, S2, S3, S4 extrastimuli refractoriness (500/260/250/210 milliseconds.  Isoproterenol was then infused at 2 mcg per minute.  During isoproterenol infusion, rapid atrial pacing was again performed.  This revealed decremental concentric VA conduction with a VA Wenckebach cycle length of 390 milliseconds.  With rapid ventricular pacing down to a cycle length of 330 milliseconds, no arrhythmias were observed.  Programmed extrastimulus testing was again performed from the right ventricular apex with a basic cycle length of 500 milliseconds with S1, S2, S3, S4 extrastimuli down to refractoriness during isoproterenol infusion (500/280/260/240 milliseconds) with no arrhythmias observed.  The catheter was then pulled back to the right ventricular base and programmed extrastimulus testing was again performed with a basic cycle length of 450 milliseconds during isoproterenol infusion at 1 mcg per minute down to refractoriness (450/290/270/210 milliseconds) with no ventricular tachycardia observed. Isoproterenol was therefore discontinued and allowed to wash out. During isoproterenol washout, atrial pacing was performed which revealed an AV Wenckebach cycle length of 390 milliseconds with no evidence of PR greater  than RR and no arrhythmias observed.  Atrial pacing was continued down to a cycle length of 300 milliseconds with no arrhythmias observed during isoproterenol washout.  Atrial extrastimulus testing was again performed during isoproterenol washout, which revealed an AH jump but no echo beats, no tachycardias observed.  The AV nodal ERP was 500/280 milliseconds.  As the patient did not have inducible ventricular tachycardia today, no ablation could be performed.  The procedure was therefore considered completed.  All catheters were removed and the sheaths were aspirated and flushed.  The sheaths were removed and hemostasis was assured.  There were no early apparent complications.  CONCLUSIONS: 1. Sinus rhythm upon presentation. 2. No evidence of accessory pathways. 3. The patient did have dual AV nodal physiology but no inducible AV     nodal reentrant tachycardia.  He has not clinically had AVNRT     previously and therefore slow pathway modification was not     performed today. 4. No inducible ventricular tachycardia both on and off of     isoproterenol. 5. Because the patient had a noninducible ventricular tachycardia     today, ablation could not be performed. 6. No early apparent complications.     Hillis Range, MD    JA/MEDQ  D:  03/14/2011  T:  03/14/2011  Job:  [id number]  cc:   Duke Salvia, MD, [facility]  Electronically Signed by Hillis Range MD on 03/22/2011 08:53:56 AM

## 2011-04-05 ENCOUNTER — Ambulatory Visit: Payer: Medicare Other | Admitting: Internal Medicine

## 2011-04-16 ENCOUNTER — Ambulatory Visit: Payer: Medicare Other | Admitting: Internal Medicine

## 2011-04-18 ENCOUNTER — Encounter: Payer: Self-pay | Admitting: Internal Medicine

## 2011-04-18 ENCOUNTER — Ambulatory Visit (INDEPENDENT_AMBULATORY_CARE_PROVIDER_SITE_OTHER): Payer: Medicare Other | Admitting: Internal Medicine

## 2011-04-18 VITALS — BP 146/87 | HR 67 | Ht 67.0 in | Wt 222.0 lb

## 2011-04-18 DIAGNOSIS — I472 Ventricular tachycardia, unspecified: Secondary | ICD-10-CM

## 2011-04-18 NOTE — Patient Instructions (Signed)
Your physician wants you to follow-up in: 6 months with Dr. Klein in [place]. You will receive a reminder letter in the mail two months in advance. If you don't receive a letter, please call our office to schedule the follow-up appointment. 

## 2011-04-18 NOTE — Assessment & Plan Note (Signed)
Prior RBB/LAHB VT, likely from posterior papillary muscle,  He presented for EP study 8/12 but unfortunately, VT was noninducible He will continue metoprolol and verapamil  Follow-up with Dr Graciela Husbands in [place]

## 2011-04-18 NOTE — Progress Notes (Signed)
The patient presents today for routine electrophysiology followup.  He presented for EP study/ ablation of VT 03/14/11.  Unfortunately, his VT was not inducible at that time.  HE has had no further arrhythmias since.  Today, he denies symptoms of palpitations, chest pain, shortness of breath, orthopnea, PND, lower extremity edema, dizziness, presyncope, syncope, or neurologic sequela.  The patient feels that he is tolerating medications without difficulties and is otherwise without complaint today.   Past Medical History  Diagnosis Date  . Diabetes mellitus   . Hypertension   . Hyperlipidemia   . History of colon polyps   . Benign prostatic hypertrophy   . Arthritis   . Ventricular tachycardia     RBB/LAHB ideopathic VT, 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  . Melanoma     under arm  . Osteoarthritis    Past Surgical History  Procedure Date  . Replacement total knee     Current Outpatient Prescriptions  Medication Sig Dispense Refill  . Ascorbic Acid (VITAMIN C) 500 MG tablet Take 500 mg by mouth daily.        Marland Kitchen aspirin 81 MG tablet Take 81 mg by mouth daily.        . Cholecalciferol (VITAMIN D3) 1000 UNITS CAPS Take 1 capsule by mouth daily.        . enalapril (VASOTEC) 20 MG tablet Take 20 mg by mouth daily.       . fenofibrate (TRICOR) 145 MG tablet Take 145 mg by mouth daily.        . finasteride (PROSCAR) 5 MG tablet Take 5 mg by mouth daily.        . metFORMIN (GLUCOPHAGE) 500 MG tablet Take 500 mg by mouth 2 (two) times daily with a meal.        . metoprolol tartrate (LOPRESSOR) 25 MG tablet Take 0.5 tablets (12.5 mg total) by mouth 2 (two) times daily.  30 tablet  11  . multivitamin (THERAGRAN) per tablet Take 1 tablet by mouth daily.        . Tamsulosin HCl (FLOMAX) 0.4 MG CAPS Take 0.4 mg by mouth daily.        . verapamil (CALAN-SR) 180 MG CR tablet Take 1 tablet (180 mg total) by mouth at bedtime.  30 tablet  12    Allergies  Allergen Reactions  . Levaquin Anaphylaxis   "throat closes"  . Ciprofloxacin     History   Social History  . Marital Status: Married    Spouse Name: N/A    Number of Children: N/A  . Years of Education: N/A   Occupational History  . Not on file.   Social History Main Topics  . Smoking status: Former Games developer  . Smokeless tobacco: Never Used  . Alcohol Use: 0.0 oz/week    3-4 Cans of beer per week     prior heavy  . Drug Use: No  . Sexually Active: Not on file   Other Topics Concern  . Not on file   Social History Narrative   retired    Family History  Problem Relation Age of Onset  . Stroke Father     ROS-  All systems are reviewed and are negative except as outlined in the HPI above   Physical Exam: Filed Vitals:   04/18/11 1451  BP: 146/87  Pulse: 67  Height: 5\' 7"  (1.702 m)  Weight: 222 lb (100.699 kg)    GEN- The patient is well appearing, alert and oriented x 3 today.  Head- normocephalic, atraumatic Eyes-  Sclera clear, conjunctiva pink Ears- hearing intact Oropharynx- clear Neck- supple, no JVP Lymph- no cervical lymphadenopathy Lungs- Clear to ausculation bilaterally, normal work of breathing Heart- Regular rate and rhythm, no murmurs, rubs or gallops, PMI not laterally displaced GI- soft, NT, ND, + BS Extremities- no clubbing, cyanosis, or edema MS- no significant deformity or atrophy Skin- no rash or lesion Psych- euthymic mood, full affect Neuro- strength and sensation are intact  ekg today reveals sinus rhythm 63 bpm, T wave flattening, otherwise normal ekg  Assessment and Plan:

## 2011-05-03 ENCOUNTER — Ambulatory Visit: Payer: Medicare Other | Admitting: Internal Medicine

## 2011-05-08 ENCOUNTER — Ambulatory Visit (INDEPENDENT_AMBULATORY_CARE_PROVIDER_SITE_OTHER): Payer: Medicare Other | Admitting: Infectious Diseases

## 2011-05-08 ENCOUNTER — Encounter: Payer: Self-pay | Admitting: Infectious Diseases

## 2011-05-08 VITALS — BP 153/84 | HR 78 | Temp 98.2°F | Ht 67.0 in | Wt 236.0 lb

## 2011-05-08 DIAGNOSIS — N390 Urinary tract infection, site not specified: Secondary | ICD-10-CM

## 2011-05-08 MED ORDER — CEFIXIME 400 MG PO TABS: 400.0000 mg | ORAL_TABLET | Freq: Every day | ORAL | Status: AC

## 2011-05-08 NOTE — Assessment & Plan Note (Signed)
He is moderately symptomatic at this point. We discussed several options (oral therapy with trim/sulfa previously gave him a rash vs sunburn) such as oral rx with 3rd generation cephalosporin, IM therapy daily with ceftriaxone, or IV therapy via a PIC or midline. He would like to try the oral therapy. This could be effective given that his bug is sensitive to ceftriaxone (although not a preferred regimen in [facility] guide or [address] guide). My concern is long term that he will develop resistance to this rx. He asks about repeating his UCx at the end of therapy but i suggested we do this only if he is symptomatic. He will return to clinic if he has further symptoms.

## 2011-05-08 NOTE — Progress Notes (Signed)
  Subjective:    Patient ID: Austin Warren, male    DOB: 02/03/1941, 70 y.o.   MRN: [id number]  HPI 70 yo M with a hx of DM2 (for  1 year) and UTI for "years". Has had intermittently for 5 years. Has previously been on amox with relief. Is symptomatic with frequency and burning when it flares. No fever or chills.  He has a hx of BPH with bladder outlet obstruction.    Review of Systems  Constitutional: Negative for fever, chills, activity change and unexpected weight change.  Gastrointestinal: Negative for diarrhea and constipation.  Genitourinary: Positive for dysuria.       Objective:   Physical Exam  Constitutional: He appears well-developed and well-nourished.  Eyes: EOM are normal. Pupils are equal, round, and reactive to light.  Neck: Neck supple.  Cardiovascular: Normal rate, regular rhythm and normal heart sounds.   Pulmonary/Chest: Effort normal and breath sounds normal.  Abdominal: Soft. Bowel sounds are normal. He exhibits no distension. There is no tenderness. There is no rebound and no CVA tenderness.  Lymphadenopathy:    He has no cervical adenopathy.          Assessment & Plan:

## 2011-05-24 ENCOUNTER — Telehealth: Payer: Self-pay | Admitting: Internal Medicine

## 2011-05-24 NOTE — Telephone Encounter (Signed)
Pt wife calling to get surgical clearance for knee surgery. Fax Dr Ernest Pine [phone]

## 2011-05-25 NOTE — Telephone Encounter (Signed)
He should be a t acceptable risk for surgery

## 2011-05-25 NOTE — Telephone Encounter (Signed)
Dr. Graciela Husbands, can we send clearance for knee surgery, pt not on blood thinner per medlist. Thanks.

## 2011-05-28 ENCOUNTER — Encounter: Payer: Self-pay | Admitting: *Deleted

## 2011-05-28 NOTE — Telephone Encounter (Signed)
Attempted to contact pt, LMOM TCB.  Faxed clearance to Dr. Elenor Legato office.

## 2011-05-29 NOTE — Telephone Encounter (Signed)
Attempted to contact pt, LMOM TCB.  

## 2011-05-31 ENCOUNTER — Ambulatory Visit: Payer: Self-pay | Admitting: General Practice

## 2011-06-04 ENCOUNTER — Encounter: Payer: Self-pay | Admitting: Internal Medicine

## 2011-06-04 ENCOUNTER — Telehealth: Payer: Self-pay | Admitting: *Deleted

## 2011-06-04 ENCOUNTER — Ambulatory Visit (INDEPENDENT_AMBULATORY_CARE_PROVIDER_SITE_OTHER): Payer: Medicare Other | Admitting: Internal Medicine

## 2011-06-04 DIAGNOSIS — N390 Urinary tract infection, site not specified: Secondary | ICD-10-CM

## 2011-06-04 MED ORDER — SULFAMETHOXAZOLE-TRIMETHOPRIM 800-160 MG PO TABS: 1.0000 | ORAL_TABLET | Freq: Two times a day (BID) | ORAL | Status: AC

## 2011-06-04 NOTE — Patient Instructions (Signed)
Call Wed if you are improving.

## 2011-06-04 NOTE — Assessment & Plan Note (Addendum)
He is currently having symptoms and he did try a trial of cephalosporins p.o. But has not completely resolved his symptoms. I did discuss the options which include IV therapy or p.o. With sulfa. In further discussion with him he does not seem to be a significant allergy to sulfa and therefore he is going to take Bactrim double strength twice a day for 5 days and if he develops any rash he is told to call us right away and stopped the medications but if he continues to do well he will call in 2 days to let us know that he is improving on the current therapy and I will at that time then send a message to Dr. Ernest Pine, his orthopedist, to let him know that he is being successfully treated for urinary tract infection.  This is an addendum on 1121 2012. The patient did start his Bactrim as prescribed and is having no side effects. His symptoms are improving and therefore his UTI is clearing and there is no contraindication to his surgery next week from an infectious disease standpoint. His orthopedists, Dr. Ernest Pine, will be advised of this.

## 2011-06-04 NOTE — Telephone Encounter (Signed)
He states he has finished the antibiotics as well as the refill on it. Symptoms of uti are back. States he is "uncomfortable" I checked with another rn & asked the front desk to put him in the schedule today even though it is not his usual md. Pt states he is drinking plenty of fluids.

## 2011-06-04 NOTE — Progress Notes (Signed)
  Subjective:    Patient ID: Austin Warren, male    DOB: 10-Dec-1940, 70 y.o.   MRN: [id number]  HPI follow-through to recurrence of his symptoms. He has had issues with UTIs in the past 5 years and was last seen as a new patient October 23 of this year do to his frequent symptoms. He has a definite allergy to fluoroquinolones from Levaquin which causes throat swelling and anaphylaxis but had a reaction to sulfa many years ago that in retrospect he feels it's not consistent with an allergy. He did have some redness of the skin but was also in conjunction with sun exposure and did not have a rash consistent with hives and had no gross swelling or anything else. In regards to his UTIs, he does get a burning sensation in sometimes gets increased frequency. He comes in here now because he is scheduled for left total knee arthropathy next Wednesday, the 28th, and has a current UTI. I have a urine culture available to me however I do not at this time have a UA. He has the same symptoms at this time including burning no fever or chills and he was given a prescription for Cipro but he is allergic to that and so presents here to get more definitive therapy for in time for his surgery    Review of Systems  Constitutional: Negative for fever, chills and activity change.  Gastrointestinal: Negative for nausea and diarrhea.  Genitourinary: Positive for dysuria. Negative for urgency, frequency and hematuria.  Skin: Negative for rash.       Objective:   Physical Exam  Constitutional: He appears well-developed and well-nourished. No distress.  Cardiovascular: Normal rate, regular rhythm and normal heart sounds.   No murmur heard. Pulmonary/Chest: Effort normal and breath sounds normal. No respiratory distress. He has no wheezes.  Abdominal: Soft. Bowel sounds are normal. There is no tenderness.  Musculoskeletal: He exhibits no edema.  Skin: Skin is warm and dry. No erythema.  Psychiatric: He has a normal  mood and affect. His behavior is normal.          Assessment & Plan:

## 2011-06-06 ENCOUNTER — Telehealth: Payer: Self-pay

## 2011-06-06 NOTE — Telephone Encounter (Signed)
Please send a copy of the note, that includes an addendum, to Dr. Ernest Pine, orthopedics, in [place]. His surgery is next week.  Thanks

## 2011-06-06 NOTE — Telephone Encounter (Signed)
Pt states he is feeling better. He would like to remind Dr Luciana Axe to send note to his orthopedist.  Laurell Josephs, RN

## 2011-06-11 ENCOUNTER — Telehealth: Payer: Self-pay | Admitting: *Deleted

## 2011-06-11 NOTE — Telephone Encounter (Signed)
Office note faxed to Dr. Ernest Pine orthopedic in [facility]

## 2011-06-11 NOTE — Telephone Encounter (Signed)
Office note faxed to Dr. Cornelious Bryant [facility]

## 2011-06-13 ENCOUNTER — Inpatient Hospital Stay: Payer: Self-pay | Admitting: General Practice

## 2011-07-22 ENCOUNTER — Emergency Department: Payer: Self-pay | Admitting: Emergency Medicine

## 2011-11-01 ENCOUNTER — Ambulatory Visit (INDEPENDENT_AMBULATORY_CARE_PROVIDER_SITE_OTHER): Payer: Medicare Other | Admitting: Internal Medicine

## 2011-11-01 ENCOUNTER — Encounter: Payer: Self-pay | Admitting: Internal Medicine

## 2011-11-01 VITALS — BP 152/72 | HR 69 | Ht 67.0 in | Wt 217.8 lb

## 2011-11-01 DIAGNOSIS — I472 Ventricular tachycardia: Secondary | ICD-10-CM

## 2011-11-01 DIAGNOSIS — R0989 Other specified symptoms and signs involving the circulatory and respiratory systems: Secondary | ICD-10-CM

## 2011-11-01 DIAGNOSIS — R0602 Shortness of breath: Secondary | ICD-10-CM | POA: Insufficient documentation

## 2011-11-01 DIAGNOSIS — R609 Edema, unspecified: Secondary | ICD-10-CM | POA: Insufficient documentation

## 2011-11-01 DIAGNOSIS — R0609 Other forms of dyspnea: Secondary | ICD-10-CM

## 2011-11-01 DIAGNOSIS — R0600 Dyspnea, unspecified: Secondary | ICD-10-CM

## 2011-11-01 HISTORY — DX: Dyspnea, unspecified: R06.00

## 2011-11-01 HISTORY — DX: Other forms of dyspnea: R06.09

## 2011-11-01 MED ORDER — FUROSEMIDE 20 MG PO TABS: 20.0000 mg | ORAL_TABLET | Freq: Every day | ORAL | Status: DC

## 2011-11-01 NOTE — Patient Instructions (Signed)
PLEASE MAKE APPT TO SEE DR. ANDERSON IN 3 WEEKS PER DR. [facility]  Your physician has requested that you have an echocardiogram DX 786.05 SOB TO BE DONE IN [facility] PER DR. KLEIN. Echocardiography is a painless test that uses sound waves to create images of your heart. It provides your doctor with information about the size and shape of your heart and how well your heart's chambers and valves are working. This procedure takes approximately one hour. There are no restrictions for this procedure.  Your physician has requested that you have a lower extremity venous duplex DX EDEMA THIS IS TO BE DONE IN 1 WEEK IN [facility] PER DR. KLEIN. This test is an ultrasound of the veins in the legs or arms. It looks at venous blood flow that carries blood from the heart to the legs or arms. Allow one hour for a Lower Venous exam. Allow thirty minutes for an Upper Venous exam. There are no restrictions or special instructions.  START LASIX 20 MG DAILY FOR 21 DAYS

## 2011-11-01 NOTE — Assessment & Plan Note (Signed)
He manifest right-sided volume overload. I suspect that this is all diastolic heart failure. Will obtain an echo to look at left ventricular function. He had a normal catheterization in the last couple of years. It is unlikely that he has Q waves or left ventricular dysfunction and that is the issue. The onset of his symptoms is quite remote from his knee surgery so I think the likelihood of pulmonary embolism small. Furthermore his edema is bilateral. We will review the Doppler just to make sure.  He is getting ready to drive to [place]. I think his pretest likelihood is sufficiently small and rhythm studies thereafter but advised him to get up and get out and walk the rest stops

## 2011-11-01 NOTE — Progress Notes (Signed)
  HPI  Austin Warren is a 71 y.o. male Seen in followup for a ventricular tachycardia with a right bundle superior axis relatively narrow QRS complex and positive concordance suggesting a septal origin and possible verapamil sensitivity. Catheterization had demonstrated normal coronary arteries and normal left ventricular function. Signal average Electrocardiogram was markedly abnormal.MRI was normal.  We initially tried him on a beta blocker prescription. He had recurrent ventricular tachycardia. He was put on verapamil.  Complaint of shortness of breath was worse over the last month or so. He has also noted some peripheral edema. This was also noted by Dr. Dareen Piano about a month or so ago.  He denies chest pain. He had a replacement surgery in December. He was on anticoagulation for a week thereafter.       Past Medical History  Diagnosis Date  . Diabetes mellitus   . Hypertension   . Hyperlipidemia   . History of colon polyps   . Benign prostatic hypertrophy   . Arthritis   . Ventricular tachycardia     RBB/LAHB ideopathic VT, 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  . Melanoma     under arm  . Osteoarthritis     Past Surgical History  Procedure Date  . Replacement total knee   . Vasectomy   . Joint replacement     R TKR    Current Outpatient Prescriptions  Medication Sig Dispense Refill  . Ascorbic Acid (VITAMIN C) 500 MG tablet Take 500 mg by mouth daily.        Marland Kitchen aspirin 81 MG tablet Take 81 mg by mouth daily.        . carvedilol (COREG) 6.25 MG tablet Take 6.25 mg by mouth 2 (two) times daily with a meal.      . Cholecalciferol (VITAMIN D3) 1000 UNITS CAPS Take 1 capsule by mouth daily.        . fenofibrate (TRICOR) 145 MG tablet Take 145 mg by mouth daily.        . finasteride (PROSCAR) 5 MG tablet Take 5 mg by mouth daily.        . metFORMIN (GLUCOPHAGE) 500 MG tablet Take 500 mg by mouth 2 (two) times daily with a meal.        . multivitamin (THERAGRAN) per tablet Take  1 tablet by mouth daily.        . Tamsulosin HCl (FLOMAX) 0.4 MG CAPS Take 0.4 mg by mouth daily.        . verapamil (CALAN-SR) 180 MG CR tablet Take 1 tablet (180 mg total) by mouth at bedtime.  30 tablet  12    Allergies  Allergen Reactions  . Levaquin Anaphylaxis    "throat closes"  . Ciprofloxacin     Review of Systems negative except from HPI and PMH  Physical Exam BP 152/72  Pulse 69  Ht 5\' 7"  (1.702 m)  Wt 217 lb 12 oz (98.771 kg)  BMI 34.10 kg/m2 Well developed and well nourished in no acute distress HENT normal E scleral and icterus clear Neck Supple JVP8-10; carotids brisk and full Clear to ausculation Regular rate and rhythm, no murmurs gallops or rub Soft with active bowel sounds No clubbing cyanosis 1+ Edema Alert and oriented, grossly normal motor and sensory function Skin Warm and Dry  Sinus rhythm at 69 of intervals 0.08/24/40 Right axis XCII Low-voltage limb leads   Assessment and  Plan

## 2011-11-01 NOTE — Assessment & Plan Note (Addendum)
He has had no intercurrent ventricular tachycardia which is rare. As an isolated PVC on his ecg

## 2011-11-08 ENCOUNTER — Encounter (INDEPENDENT_AMBULATORY_CARE_PROVIDER_SITE_OTHER): Payer: Medicare Other

## 2011-11-08 DIAGNOSIS — R609 Edema, unspecified: Secondary | ICD-10-CM

## 2011-11-08 DIAGNOSIS — R0602 Shortness of breath: Secondary | ICD-10-CM

## 2011-11-08 DIAGNOSIS — I472 Ventricular tachycardia: Secondary | ICD-10-CM

## 2011-11-22 ENCOUNTER — Other Ambulatory Visit: Payer: Self-pay

## 2011-11-22 ENCOUNTER — Other Ambulatory Visit (INDEPENDENT_AMBULATORY_CARE_PROVIDER_SITE_OTHER): Payer: Medicare Other

## 2011-11-22 DIAGNOSIS — R0602 Shortness of breath: Secondary | ICD-10-CM

## 2011-11-22 DIAGNOSIS — I472 Ventricular tachycardia: Secondary | ICD-10-CM

## 2011-11-27 ENCOUNTER — Other Ambulatory Visit: Payer: Medicare Other

## 2011-12-13 ENCOUNTER — Other Ambulatory Visit: Payer: Self-pay | Admitting: Internal Medicine

## 2012-07-29 ENCOUNTER — Other Ambulatory Visit: Payer: Self-pay | Admitting: Internal Medicine

## 2012-08-29 ENCOUNTER — Ambulatory Visit: Payer: Self-pay | Admitting: Unknown Physician Specialty

## 2012-11-28 DIAGNOSIS — R972 Elevated prostate specific antigen [PSA]: Secondary | ICD-10-CM | POA: Insufficient documentation

## 2013-01-19 ENCOUNTER — Other Ambulatory Visit: Payer: Self-pay | Admitting: Internal Medicine

## 2013-02-17 ENCOUNTER — Other Ambulatory Visit: Payer: Self-pay | Admitting: Internal Medicine

## 2013-03-05 ENCOUNTER — Encounter: Payer: Self-pay | Admitting: Internal Medicine

## 2013-03-05 ENCOUNTER — Ambulatory Visit (INDEPENDENT_AMBULATORY_CARE_PROVIDER_SITE_OTHER): Payer: Medicare Other | Admitting: Internal Medicine

## 2013-03-05 VITALS — BP 154/84 | HR 75 | Ht 67.0 in | Wt 241.5 lb

## 2013-03-05 DIAGNOSIS — G4710 Hypersomnia, unspecified: Secondary | ICD-10-CM

## 2013-03-05 DIAGNOSIS — R9431 Abnormal electrocardiogram [ECG] [EKG]: Secondary | ICD-10-CM | POA: Insufficient documentation

## 2013-03-05 DIAGNOSIS — E669 Obesity, unspecified: Secondary | ICD-10-CM | POA: Insufficient documentation

## 2013-03-05 DIAGNOSIS — R0609 Other forms of dyspnea: Secondary | ICD-10-CM

## 2013-03-05 DIAGNOSIS — I472 Ventricular tachycardia: Secondary | ICD-10-CM

## 2013-03-05 MED ORDER — VERAPAMIL HCL ER 180 MG PO TBCR: 180.0000 mg | EXTENDED_RELEASE_TABLET | Freq: Every day | ORAL | Status: DC

## 2013-03-05 NOTE — Patient Instructions (Signed)
Your physician wants you to follow-up in:  12 months.  You will receive a reminder letter in the mail two months in advance. If you don't receive a letter, please call our office to schedule the follow-up appointment.   

## 2013-03-05 NOTE — Assessment & Plan Note (Signed)
I suspect that he has sleep apnea additionally aggravated by his recent significant 10% weight gain. I suggested he have a sleep study. Improving his energy might help him with exercise.

## 2013-03-05 NOTE — Assessment & Plan Note (Signed)
20 minute discussion on this alone regarding obesity and the need for exercise and different strategies.

## 2013-03-05 NOTE — Assessment & Plan Note (Signed)
This is clinically quiet. We'll continue verapamil.

## 2013-03-05 NOTE — Assessment & Plan Note (Signed)
This is worsening as he gets heavier

## 2013-03-05 NOTE — Assessment & Plan Note (Signed)
I am bothered by the low-voltage EKG in the context of his hypertrophy on echo. Immunofixation and kappa/lambda light chain analysis are probably appropriate and he may well benefit from a fat pad biopsy versus MRI. There are no effusion might be evident on echo

## 2013-03-05 NOTE — Progress Notes (Signed)
Patient Care Team: Lauro Regulus, MD as PCP - General (Unknown Physician Specialty)   HPI  Austin Warren is a 72 y.o. male Seen in followup for a ventricular tachycardia with a right bundle superior axis relatively narrow QRS complex and positive concordance suggesting a septal origin and possible verapamil sensitivity. Catheterization had demonstrated normal coronary arteries and normal left ventricular function. Signal average Electrocardiogram was markedly abnormal.MRI was normal.  Echocardiogram 5/13 2 normal RV and LV function. We initially tried him on a beta blocker prescription. He had recurrent ventricular tachycardia. He was put on verapamil.  He has had no significant recurrences of arrhythmia.  He snores. He has daytime somnolence.  He is prone 25 pounds in the last 16 months   Past Medical History  Diagnosis Date  . Diabetes mellitus   . Hypertension   . Hyperlipidemia   . History of colon polyps   . Benign prostatic hypertrophy   . Arthritis   . Ventricular tachycardia     RBB/LAHB ideopathic VT, 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  . Melanoma     under arm  . Osteoarthritis   . Dyspnea on exertion 11/01/2011    Past Surgical History  Procedure Laterality Date  . Replacement total knee    . Vasectomy    . Joint replacement      R TKR  . Replacement total knee Left     Current Outpatient Prescriptions  Medication Sig Dispense Refill  . Ascorbic Acid (VITAMIN C) 500 MG tablet Take 500 mg by mouth daily.        Marland Kitchen aspirin 81 MG tablet Take 81 mg by mouth daily.        . carvedilol (COREG) 6.25 MG tablet Take 6.25 mg by mouth 2 (two) times daily with a meal.      . Cholecalciferol (VITAMIN D3) 1000 UNITS CAPS Take 1 capsule by mouth daily.        . fenofibrate (TRICOR) 145 MG tablet Take 145 mg by mouth daily.        . finasteride (PROSCAR) 5 MG tablet Take 5 mg by mouth daily.        . metFORMIN (GLUCOPHAGE) 500 MG tablet Take 500 mg by mouth 2 (two) times  daily with a meal.        . multivitamin (THERAGRAN) per tablet Take 1 tablet by mouth daily.        . Tamsulosin HCl (FLOMAX) 0.4 MG CAPS Take 0.4 mg by mouth daily.        Marland Kitchen torsemide (DEMADEX) 10 MG tablet Take 10 mg by mouth daily.       . verapamil (CALAN-SR) 180 MG CR tablet TAKE 1 TABLET AT EVERY BEDTIME  30 tablet  0   No current facility-administered medications for this visit.    Allergies  Allergen Reactions  . Levofloxacin Anaphylaxis    "throat closes"  . Ciprofloxacin     Review of Systems negative except from HPI and PMH  Physical Exam BP 154/84  Pulse 75  Ht 5\' 7"  (1.702 m)  Wt 241 lb 8 oz (109.544 kg)  BMI 37.82 kg/m2 Well developed and morbidly obese in no distress HENT normal E scleral and icterus clear Neck Supple JVP-Can't see Clear to ausculation  Regular rate and rhythm, no murmurs gallops or rub Soft with active bowel sounds No clubbing cyanosis Trace Edema Alert and oriented, grossly normal motor and sensory function Skin Warm and Dry  Sinus rhythm at 73 and  sentinel 20/10/41 Low-voltage limb leads Occasion ventricular ectopic beat  Assessment and  Plan

## 2013-09-04 ENCOUNTER — Emergency Department: Payer: Self-pay | Admitting: Emergency Medicine

## 2013-12-19 DIAGNOSIS — E119 Type 2 diabetes mellitus without complications: Secondary | ICD-10-CM | POA: Insufficient documentation

## 2013-12-19 DIAGNOSIS — R1900 Intra-abdominal and pelvic swelling, mass and lump, unspecified site: Secondary | ICD-10-CM | POA: Insufficient documentation

## 2013-12-19 DIAGNOSIS — N138 Other obstructive and reflux uropathy: Secondary | ICD-10-CM | POA: Insufficient documentation

## 2013-12-19 DIAGNOSIS — N401 Enlarged prostate with lower urinary tract symptoms: Secondary | ICD-10-CM

## 2013-12-19 DIAGNOSIS — E1169 Type 2 diabetes mellitus with other specified complication: Secondary | ICD-10-CM | POA: Insufficient documentation

## 2014-02-23 ENCOUNTER — Other Ambulatory Visit: Payer: Self-pay | Admitting: Internal Medicine

## 2014-03-09 ENCOUNTER — Other Ambulatory Visit: Payer: Self-pay | Admitting: Internal Medicine

## 2014-03-09 ENCOUNTER — Ambulatory Visit (INDEPENDENT_AMBULATORY_CARE_PROVIDER_SITE_OTHER): Payer: Medicare Other | Admitting: Internal Medicine

## 2014-03-09 ENCOUNTER — Encounter: Payer: Self-pay | Admitting: Internal Medicine

## 2014-03-09 VITALS — BP 144/88 | HR 59 | Ht 67.0 in | Wt 232.0 lb

## 2014-03-09 DIAGNOSIS — I4729 Other ventricular tachycardia: Secondary | ICD-10-CM

## 2014-03-09 DIAGNOSIS — I472 Ventricular tachycardia, unspecified: Secondary | ICD-10-CM

## 2014-03-09 DIAGNOSIS — E859 Amyloidosis, unspecified: Secondary | ICD-10-CM

## 2014-03-09 DIAGNOSIS — N289 Disorder of kidney and ureter, unspecified: Secondary | ICD-10-CM

## 2014-03-09 LAB — BASIC METABOLIC PANEL
ANION GAP: 8 (ref 7–16)
BUN: 15 mg/dL (ref 7–18)
CO2: 31 mmol/L (ref 21–32)
CREATININE: 0.9 mg/dL (ref 0.60–1.30)
Calcium, Total: 8.8 mg/dL (ref 8.5–10.1)
Chloride: 104 mmol/L (ref 98–107)
EGFR (African American): >60
EGFR (Non-African Amer.): >60
Glucose: 133 mg/dL — ABNORMAL HIGH (ref 65–99)
Osmolality: 288 (ref 275–301)
Potassium: 4.5 mmol/L (ref 3.5–5.1)
SODIUM: 143 mmol/L (ref 136–145)

## 2014-03-09 NOTE — Progress Notes (Signed)
Patient Care Team: Kirk Ruths, MD as PCP - General (Unknown Physician Specialty)   HPI  Austin Warren is a 73 y.o. male Seen in followup for a ventricular tachycardia with a right bundle superior axis relatively narrow QRS complex and positive concordance suggesting a septal origin and possible verapamil sensitivity. Catheterization had demonstrated normal coronary arteries and normal left ventricular function. Signal average Electrocardiogram was markedly abnormal.  MRI was normal in 2012 Echocardiogram 5/13 2 normal RV and LV function. We initially tried him on a beta blocker prescription. He had recurrent ventricular tachycardia. He was put on verapamil.  He has had no significant recurrences of arrhythmia.       Past Medical History  Diagnosis Date  . Diabetes mellitus   . Hypertension   . Hyperlipidemia   . History of colon polyps   . Benign prostatic hypertrophy   . Arthritis   . Ventricular tachycardia     RBB/LAHB ideopathic VT, 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  . Melanoma     under arm  . Osteoarthritis   . Dyspnea on exertion 11/01/2011    Past Surgical History  Procedure Laterality Date  . Replacement total knee    . Vasectomy    . Joint replacement      R TKR  . Replacement total knee Left     Current Outpatient Prescriptions  Medication Sig Dispense Refill  . Ascorbic Acid (VITAMIN C) 500 MG tablet Take 500 mg by mouth daily.        Marland Kitchen aspirin 81 MG tablet Take 81 mg by mouth daily.        . carvedilol (COREG) 6.25 MG tablet Take 6.25 mg by mouth 2 (two) times daily with a meal.      . Cholecalciferol (VITAMIN D3) 1000 UNITS CAPS Take 1 capsule by mouth daily.        . fenofibrate (TRICOR) 145 MG tablet Take 145 mg by mouth daily.        . finasteride (PROSCAR) 5 MG tablet Take 5 mg by mouth daily.        . metFORMIN (GLUCOPHAGE) 500 MG tablet Take 500 mg by mouth 2 (two) times daily with a meal.        . multivitamin (THERAGRAN) per tablet Take 1  tablet by mouth daily.        . Tamsulosin HCl (FLOMAX) 0.4 MG CAPS Take 0.4 mg by mouth daily.        Marland Kitchen torsemide (DEMADEX) 10 MG tablet Take 10 mg by mouth daily.       . verapamil (CALAN-SR) 180 MG CR tablet TAKE ONE TABLET AT BEDTIME  30 tablet  3   No current facility-administered medications for this visit.    Allergies  Allergen Reactions  . Levofloxacin Anaphylaxis    "throat closes"  . Ciprofloxacin     Review of Systems negative except from HPI and PMH  Physical Exam BP 144/88  Pulse 59  Ht 5\' 7"  (1.702 m)  Wt 232 lb (105.235 kg)  BMI 36.33 kg/m2 Well developed and morbidly obese in no distress HENT normal E scleral and icterus clear Neck Supple JVP-Can't see Clear to ausculation  Regular rate and rhythm, no murmurs gallops or rub Soft with active bowel sounds No clubbing cyanosis Trace Edema Alert and oriented, grossly normal motor and sensory function Skin Warm and mildly diaphoretic  Sinus rhythm at 59  21/1/41 Low-voltage limb leads    Assessment and  Plan  Ventricular  tachycardia  Low-voltage ECG  Obesity   I remain concerned about amyloid. We'll undertake serum free light chains. Will check renal function with the anticipation of receiving he is cardiac MRI. I will discuss with colleagues the role of cath had biopsies  No intercurrent ventricular tachycardia; continue verapamil

## 2014-03-09 NOTE — Patient Instructions (Addendum)
Your physician recommends that you return for lab work in:  Take lab orders to [facility] today  Serum free light chains  BMP    Your physician wants you to follow-up in: 1 year with Dr. Caryl Comes. You will receive a reminder letter in the mail two months in advance. If you don't receive a letter, please call our office to schedule the follow-up appointment.

## 2014-03-10 ENCOUNTER — Other Ambulatory Visit: Payer: Self-pay

## 2014-03-10 DIAGNOSIS — N289 Disorder of kidney and ureter, unspecified: Secondary | ICD-10-CM

## 2014-03-10 LAB — KAPPA/LAMBDA FREE LIGHT CHAINS (ARMC)

## 2014-03-12 ENCOUNTER — Other Ambulatory Visit: Payer: Self-pay

## 2014-03-12 DIAGNOSIS — E859 Amyloidosis, unspecified: Secondary | ICD-10-CM

## 2014-04-23 ENCOUNTER — Telehealth: Payer: Self-pay | Admitting: *Deleted

## 2014-04-23 NOTE — Telephone Encounter (Signed)
Reviewed preliminary results with patient  Will forward to Dr. Caryl Comes for final

## 2014-04-23 NOTE — Telephone Encounter (Signed)
Please call patient with lab results from August.

## 2014-04-30 ENCOUNTER — Ambulatory Visit: Payer: Self-pay | Admitting: Internal Medicine

## 2014-04-30 ENCOUNTER — Telehealth: Payer: Self-pay | Admitting: *Deleted

## 2014-04-30 ENCOUNTER — Other Ambulatory Visit: Payer: Self-pay

## 2014-04-30 DIAGNOSIS — R609 Edema, unspecified: Secondary | ICD-10-CM

## 2014-04-30 DIAGNOSIS — R9431 Abnormal electrocardiogram [ECG] [EKG]: Secondary | ICD-10-CM

## 2014-04-30 DIAGNOSIS — R0609 Other forms of dyspnea: Secondary | ICD-10-CM

## 2014-04-30 LAB — CBC WITH DIFFERENTIAL/PLATELET
BASOS PCT: 0.8 %
Basophil #: 0.1 10*3/uL (ref 0.0–0.1)
EOS PCT: 2.3 %
Eosinophil #: 0.2 10*3/uL (ref 0.0–0.7)
HCT: 48.1 % (ref 40.0–52.0)
HGB: 16 g/dL (ref 13.0–18.0)
LYMPHS PCT: 19.4 %
Lymphocyte #: 1.6 10*3/uL (ref 1.0–3.6)
MCH: 32.8 pg (ref 26.0–34.0)
MCHC: 33.3 g/dL (ref 32.0–36.0)
MCV: 98 fL (ref 80–100)
MONO ABS: 0.9 x10 3/mm (ref 0.2–1.0)
Monocyte %: 10.2 %
Neutrophil #: 5.6 10*3/uL (ref 1.4–6.5)
Neutrophil %: 67.3 %
PLATELETS: 135 10*3/uL — AB (ref 150–440)
RBC: 4.89 10*6/uL (ref 4.40–5.90)
RDW: 13.5 % (ref 11.5–14.5)
WBC: 8.4 10*3/uL (ref 3.8–10.6)

## 2014-04-30 LAB — URINALYSIS, COMPLETE
BILIRUBIN, UR: NEGATIVE
Blood: NEGATIVE
Glucose,UR: NEGATIVE mg/dL (ref 0–75)
KETONE: NEGATIVE
Leukocyte Esterase: NEGATIVE
Nitrite: NEGATIVE
PH: 6 (ref 4.5–8.0)
Protein: NEGATIVE
Specific Gravity: 1.01 (ref 1.003–1.030)
Squamous Epithelial: 1
WBC UR: 2 /HPF (ref 0–5)

## 2014-04-30 NOTE — Telephone Encounter (Signed)
Patient verbalized understanding  

## 2014-04-30 NOTE — Telephone Encounter (Signed)
Message copied by Tracie Harrier on Fri Apr 30, 2014 11:00 AM ------      Message from: Deboraha Sprang      Created: Wed Apr 28, 2014  8:03 AM       Plz let pt know that labs a little abnormal but spoke with the experts who felt unlikely to explain cardiac issues and no further workup indicated x plz check CBC and UA to make sure       ----- Message -----         From: Annia Belt, MD         Sent: 04/27/2014   6:44 PM           To: Deboraha Sprang, MD            Free light chain ratio only slightly above normal  i don!t see a current CBC  Last recorded was in 2012  15 gms.  Looks like normal renal function  i would not pursue amyloid work up unless a 24 hr urine has more than 1 gram of monoclonal protein  Doubt this will be the case w nl GFR  Hope this is helpful      Jannifer Hick      ----- Message -----         From: Deboraha Sprang, MD         Sent: 04/27/2014   9:58 AM           To: Annia Belt, MD            Clair Gulling can i ask you whether such a pts serum light chains with modest cardiac pretestprobability to pursue this abnormality in free Kappa cahins , incl  Fat pad biopsy  Thanks for your answer and willingness to answer      steve             ------

## 2014-05-03 NOTE — Telephone Encounter (Signed)
Patient picked up orders for CBC and UA

## 2014-05-16 DIAGNOSIS — I639 Cerebral infarction, unspecified: Secondary | ICD-10-CM

## 2014-05-16 HISTORY — DX: Cerebral infarction, unspecified: I63.9

## 2014-05-18 ENCOUNTER — Emergency Department (HOSPITAL_COMMUNITY): Payer: Medicare Other

## 2014-05-18 ENCOUNTER — Inpatient Hospital Stay (HOSPITAL_COMMUNITY)
Admission: EM | Admit: 2014-05-18 | Discharge: 2014-05-25 | DRG: 023 | Disposition: A | Discharge status: Skilled Nursing Facility | Payer: Medicare Other | Attending: Neurology | Admitting: Neurology

## 2014-05-18 ENCOUNTER — Encounter (HOSPITAL_COMMUNITY): Payer: Self-pay | Admitting: *Deleted

## 2014-05-18 ENCOUNTER — Inpatient Hospital Stay (HOSPITAL_COMMUNITY): Payer: Medicare Other

## 2014-05-18 ENCOUNTER — Encounter (HOSPITAL_COMMUNITY)
Admission: EM | Disposition: A | Discharge status: Skilled Nursing Facility | Payer: Self-pay | Source: Home / Self Care | Attending: Neurology

## 2014-05-18 ENCOUNTER — Emergency Department (HOSPITAL_COMMUNITY): Payer: Medicare Other | Admitting: Anesthesiology

## 2014-05-18 DIAGNOSIS — Z823 Family history of stroke: Secondary | ICD-10-CM | POA: Diagnosis not present

## 2014-05-18 DIAGNOSIS — I4891 Unspecified atrial fibrillation: Secondary | ICD-10-CM | POA: Diagnosis present

## 2014-05-18 DIAGNOSIS — G8191 Hemiplegia, unspecified affecting right dominant side: Secondary | ICD-10-CM | POA: Diagnosis present

## 2014-05-18 DIAGNOSIS — R319 Hematuria, unspecified: Secondary | ICD-10-CM | POA: Diagnosis not present

## 2014-05-18 DIAGNOSIS — I5031 Acute diastolic (congestive) heart failure: Secondary | ICD-10-CM | POA: Diagnosis present

## 2014-05-18 DIAGNOSIS — Z6836 Body mass index (BMI) 36.0-36.9, adult: Secondary | ICD-10-CM | POA: Diagnosis not present

## 2014-05-18 DIAGNOSIS — R1313 Dysphagia, pharyngeal phase: Secondary | ICD-10-CM | POA: Diagnosis present

## 2014-05-18 DIAGNOSIS — I10 Essential (primary) hypertension: Secondary | ICD-10-CM | POA: Diagnosis present

## 2014-05-18 DIAGNOSIS — Z87891 Personal history of nicotine dependence: Secondary | ICD-10-CM

## 2014-05-18 DIAGNOSIS — I472 Ventricular tachycardia, unspecified: Secondary | ICD-10-CM

## 2014-05-18 DIAGNOSIS — I6350 Cerebral infarction due to unspecified occlusion or stenosis of unspecified cerebral artery: Secondary | ICD-10-CM

## 2014-05-18 DIAGNOSIS — E119 Type 2 diabetes mellitus without complications: Secondary | ICD-10-CM | POA: Diagnosis present

## 2014-05-18 DIAGNOSIS — Z96653 Presence of artificial knee joint, bilateral: Secondary | ICD-10-CM | POA: Diagnosis present

## 2014-05-18 DIAGNOSIS — M1990 Unspecified osteoarthritis, unspecified site: Secondary | ICD-10-CM | POA: Diagnosis present

## 2014-05-18 DIAGNOSIS — R338 Other retention of urine: Secondary | ICD-10-CM | POA: Diagnosis not present

## 2014-05-18 DIAGNOSIS — G9340 Encephalopathy, unspecified: Secondary | ICD-10-CM | POA: Diagnosis present

## 2014-05-18 DIAGNOSIS — E785 Hyperlipidemia, unspecified: Secondary | ICD-10-CM | POA: Diagnosis present

## 2014-05-18 DIAGNOSIS — J9601 Acute respiratory failure with hypoxia: Secondary | ICD-10-CM

## 2014-05-18 DIAGNOSIS — H538 Other visual disturbances: Secondary | ICD-10-CM | POA: Diagnosis present

## 2014-05-18 DIAGNOSIS — Z881 Allergy status to other antibiotic agents status: Secondary | ICD-10-CM | POA: Diagnosis not present

## 2014-05-18 DIAGNOSIS — J9600 Acute respiratory failure, unspecified whether with hypoxia or hypercapnia: Secondary | ICD-10-CM | POA: Diagnosis present

## 2014-05-18 DIAGNOSIS — R1311 Dysphagia, oral phase: Secondary | ICD-10-CM | POA: Diagnosis present

## 2014-05-18 DIAGNOSIS — R29810 Facial weakness: Secondary | ICD-10-CM | POA: Diagnosis present

## 2014-05-18 DIAGNOSIS — I639 Cerebral infarction, unspecified: Secondary | ICD-10-CM | POA: Insufficient documentation

## 2014-05-18 DIAGNOSIS — Z85820 Personal history of malignant melanoma of skin: Secondary | ICD-10-CM | POA: Diagnosis not present

## 2014-05-18 DIAGNOSIS — I63412 Cerebral infarction due to embolism of left middle cerebral artery: Secondary | ICD-10-CM | POA: Diagnosis present

## 2014-05-18 DIAGNOSIS — I63031 Cerebral infarction due to thrombosis of right carotid artery: Secondary | ICD-10-CM

## 2014-05-18 DIAGNOSIS — I429 Cardiomyopathy, unspecified: Secondary | ICD-10-CM | POA: Diagnosis present

## 2014-05-18 DIAGNOSIS — I4821 Permanent atrial fibrillation: Secondary | ICD-10-CM | POA: Insufficient documentation

## 2014-05-18 DIAGNOSIS — E669 Obesity, unspecified: Secondary | ICD-10-CM | POA: Diagnosis present

## 2014-05-18 DIAGNOSIS — J811 Chronic pulmonary edema: Secondary | ICD-10-CM

## 2014-05-18 DIAGNOSIS — N401 Enlarged prostate with lower urinary tract symptoms: Secondary | ICD-10-CM | POA: Diagnosis present

## 2014-05-18 DIAGNOSIS — R4701 Aphasia: Secondary | ICD-10-CM | POA: Diagnosis present

## 2014-05-18 DIAGNOSIS — Z7982 Long term (current) use of aspirin: Secondary | ICD-10-CM

## 2014-05-18 DIAGNOSIS — Z01818 Encounter for other preprocedural examination: Secondary | ICD-10-CM

## 2014-05-18 DIAGNOSIS — K5900 Constipation, unspecified: Secondary | ICD-10-CM | POA: Diagnosis not present

## 2014-05-18 HISTORY — PX: RADIOLOGY WITH ANESTHESIA: SHX6223

## 2014-05-18 LAB — POCT I-STAT 3, ART BLOOD GAS (G3+)
ACID-BASE DEFICIT: 3 mmol/L — AB (ref 0.0–2.0)
Bicarbonate: 23.3 mEq/L (ref 20.0–24.0)
O2 Saturation: 97 %
PH ART: 7.335 — AB (ref 7.350–7.450)
TCO2: 25 mmol/L (ref 0–100)
pCO2 arterial: 43.3 mmHg (ref 35.0–45.0)
pO2, Arterial: 90 mmHg (ref 80.0–100.0)

## 2014-05-18 LAB — I-STAT CHEM 8, ED
BUN: 18 mg/dL (ref 6–23)
CREATININE: 1 mg/dL (ref 0.50–1.35)
Calcium, Ion: 1.16 mmol/L (ref 1.13–1.30)
Chloride: 102 mEq/L (ref 96–112)
Glucose, Bld: 117 mg/dL — ABNORMAL HIGH (ref 70–99)
HCT: 46 % (ref 39.0–52.0)
Hemoglobin: 15.6 g/dL (ref 13.0–17.0)
POTASSIUM: 3.9 meq/L (ref 3.7–5.3)
Sodium: 138 mEq/L (ref 137–147)
TCO2: 23 mmol/L (ref 0–100)

## 2014-05-18 LAB — COMPREHENSIVE METABOLIC PANEL
ALBUMIN: 3.9 g/dL (ref 3.5–5.2)
ALT: 41 U/L (ref 0–53)
AST: 38 U/L — AB (ref 0–37)
Alkaline Phosphatase: 44 U/L (ref 39–117)
Anion gap: 14 (ref 5–15)
BUN: 18 mg/dL (ref 6–23)
CHLORIDE: 101 meq/L (ref 96–112)
CO2: 24 meq/L (ref 19–32)
Calcium: 9.4 mg/dL (ref 8.4–10.5)
Creatinine, Ser: 0.87 mg/dL (ref 0.50–1.35)
GFR calc Af Amer: >90 mL/min (ref >90)
GFR, EST NON AFRICAN AMERICAN: 82 mL/min — AB (ref >90)
Glucose, Bld: 115 mg/dL — ABNORMAL HIGH (ref 70–99)
Potassium: 4.3 mEq/L (ref 3.7–5.3)
SODIUM: 139 meq/L (ref 137–147)
Total Bilirubin: 0.5 mg/dL (ref 0.3–1.2)
Total Protein: 6.9 g/dL (ref 6.0–8.3)

## 2014-05-18 LAB — ETHANOL: Alcohol, Ethyl (B): <11 mg/dL (ref 0–11)

## 2014-05-18 LAB — CBG MONITORING, ED: GLUCOSE-CAPILLARY: 107 mg/dL — AB (ref 70–99)

## 2014-05-18 LAB — CBC
HCT: 43.7 % (ref 39.0–52.0)
Hemoglobin: 14.8 g/dL (ref 13.0–17.0)
MCH: 32.5 pg (ref 26.0–34.0)
MCHC: 33.9 g/dL (ref 30.0–36.0)
MCV: 96 fL (ref 78.0–100.0)
PLATELETS: 167 10*3/uL (ref 150–400)
RBC: 4.55 MIL/uL (ref 4.22–5.81)
RDW: 12.8 % (ref 11.5–15.5)
WBC: 6.2 10*3/uL (ref 4.0–10.5)

## 2014-05-18 LAB — DIFFERENTIAL
BASOS ABS: 0 10*3/uL (ref 0.0–0.1)
Basophils Relative: 1 % (ref 0–1)
EOS PCT: 3 % (ref 0–5)
Eosinophils Absolute: 0.2 10*3/uL (ref 0.0–0.7)
Lymphocytes Relative: 21 % (ref 12–46)
Lymphs Abs: 1.3 10*3/uL (ref 0.7–4.0)
Monocytes Absolute: 0.5 10*3/uL (ref 0.1–1.0)
Monocytes Relative: 8 % (ref 3–12)
NEUTROS ABS: 4.2 10*3/uL (ref 1.7–7.7)
Neutrophils Relative %: 67 % (ref 43–77)

## 2014-05-18 LAB — I-STAT TROPONIN, ED: Troponin i, poc: 0.03 ng/mL (ref 0.00–0.08)

## 2014-05-18 LAB — GLUCOSE, CAPILLARY: Glucose-Capillary: 143 mg/dL — ABNORMAL HIGH (ref 70–99)

## 2014-05-18 LAB — PROTIME-INR
INR: 1.11 (ref 0.00–1.49)
PROTHROMBIN TIME: 14.4 s (ref 11.6–15.2)

## 2014-05-18 LAB — APTT: APTT: 30 s (ref 24–37)

## 2014-05-18 LAB — TRIGLYCERIDES: Triglycerides: 183 mg/dL — ABNORMAL HIGH (ref <150)

## 2014-05-18 SURGERY — RADIOLOGY WITH ANESTHESIA
Anesthesia: General

## 2014-05-18 MED ORDER — TAMSULOSIN HCL 0.4 MG PO CAPS: 0.4000 mg | ORAL_CAPSULE | Freq: Every day | ORAL | Status: DC

## 2014-05-18 MED ORDER — SODIUM CHLORIDE 0.9 % IV SOLN
INTRAVENOUS | Status: DC
  Administered 2014-05-19 (×2): via INTRAVENOUS

## 2014-05-18 MED ORDER — ACETAMINOPHEN 650 MG RE SUPP
650.0000 mg | Freq: Four times a day (QID) | RECTAL | Status: DC | PRN
Start: 2014-05-18 — End: 2014-05-21

## 2014-05-18 MED ORDER — INSULIN ASPART 100 UNIT/ML ~~LOC~~ SOLN
0.0000 [IU] | SUBCUTANEOUS | Status: DC
  Administered 2014-05-18 – 2014-05-20 (×8): 2 [IU] via SUBCUTANEOUS

## 2014-05-18 MED ORDER — IOHEXOL 300 MG/ML  SOLN
125.0000 mL | Freq: Once | INTRAMUSCULAR | Status: AC | PRN
  Administered 2014-05-18: 125 mL via INTRAVENOUS

## 2014-05-18 MED ORDER — NITROGLYCERIN 1 MG/10 ML FOR IR/CATH LAB
INTRA_ARTERIAL | Status: AC
  Filled 2014-05-18: qty 10

## 2014-05-18 MED ORDER — LIDOCAINE HCL 1 % IJ SOLN
INTRAMUSCULAR | Status: AC
  Filled 2014-05-18: qty 20

## 2014-05-18 MED ORDER — SUCCINYLCHOLINE CHLORIDE 20 MG/ML IJ SOLN
INTRAMUSCULAR | Status: DC | PRN
  Administered 2014-05-18: 140 mg via INTRAVENOUS

## 2014-05-18 MED ORDER — FENTANYL CITRATE 0.05 MG/ML IJ SOLN
INTRAMUSCULAR | Status: DC | PRN
  Administered 2014-05-18: 50 ug via INTRAVENOUS
  Administered 2014-05-18 (×2): 100 ug via INTRAVENOUS

## 2014-05-18 MED ORDER — VECURONIUM BROMIDE 10 MG IV SOLR
INTRAVENOUS | Status: DC | PRN
  Administered 2014-05-18: 5 mg via INTRAVENOUS
  Administered 2014-05-18: 10 mg via INTRAVENOUS

## 2014-05-18 MED ORDER — FINASTERIDE 5 MG PO TABS: 5.0000 mg | ORAL_TABLET | Freq: Every day | ORAL | Status: DC

## 2014-05-18 MED ORDER — ACETAMINOPHEN 325 MG PO TABS: 650.0000 mg | ORAL_TABLET | ORAL | Status: DC | PRN

## 2014-05-18 MED ORDER — CHLORHEXIDINE GLUCONATE 0.12 % MT SOLN
15.0000 mL | Freq: Two times a day (BID) | OROMUCOSAL | Status: DC
  Administered 2014-05-18 – 2014-05-20 (×5): 15 mL via OROMUCOSAL
  Filled 2014-05-18 (×5): qty 15

## 2014-05-18 MED ORDER — FENTANYL CITRATE 0.05 MG/ML IJ SOLN: 50.0000 ug | INTRAMUSCULAR | Status: DC | PRN

## 2014-05-18 MED ORDER — PANTOPRAZOLE SODIUM 40 MG IV SOLR
40.0000 mg | Freq: Every day | INTRAVENOUS | Status: DC
  Administered 2014-05-18 – 2014-05-20 (×3): 40 mg via INTRAVENOUS
  Filled 2014-05-18 (×3): qty 40

## 2014-05-18 MED ORDER — ALTEPLASE 30 MG/30 ML FOR INTERV. RAD
1.0000 mg | INTRA_ARTERIAL | Status: AC | PRN
  Administered 2014-05-18: 5 mg via INTRA_ARTERIAL
  Administered 2014-05-18: 7 mg via INTRA_ARTERIAL
  Filled 2014-05-18: qty 30

## 2014-05-18 MED ORDER — PROPOFOL 10 MG/ML IV BOLUS
INTRAVENOUS | Status: DC | PRN
  Administered 2014-05-18: 200 mg via INTRAVENOUS

## 2014-05-18 MED ORDER — CHLORHEXIDINE GLUCONATE 0.12 % MT SOLN: 15.0000 mL | Freq: Two times a day (BID) | OROMUCOSAL | Status: DC

## 2014-05-18 MED ORDER — ACETAMINOPHEN 500 MG PO TABS: 1000.0000 mg | ORAL_TABLET | Freq: Four times a day (QID) | ORAL | Status: DC | PRN

## 2014-05-18 MED ORDER — METFORMIN HCL 500 MG PO TABS: 500.0000 mg | ORAL_TABLET | Freq: Two times a day (BID) | ORAL | Status: DC

## 2014-05-18 MED ORDER — PANTOPRAZOLE SODIUM 40 MG IV SOLR: 40.0000 mg | Freq: Every day | INTRAVENOUS | Status: DC

## 2014-05-18 MED ORDER — SENNOSIDES-DOCUSATE SODIUM 8.6-50 MG PO TABS
1.0000 | ORAL_TABLET | Freq: Every evening | ORAL | Status: DC | PRN
  Filled 2014-05-18: qty 1

## 2014-05-18 MED ORDER — LIDOCAINE HCL (CARDIAC) 20 MG/ML IV SOLN
INTRAVENOUS | Status: DC | PRN
  Administered 2014-05-18: 50 mg via INTRAVENOUS

## 2014-05-18 MED ORDER — LABETALOL HCL 5 MG/ML IV SOLN: 10.0000 mg | INTRAVENOUS | Status: DC | PRN

## 2014-05-18 MED ORDER — STROKE: EARLY STAGES OF RECOVERY BOOK
Freq: Once | Status: DC
  Filled 2014-05-18: qty 1

## 2014-05-18 MED ORDER — SODIUM CHLORIDE 0.9 % IV SOLN
INTRAVENOUS | Status: DC | PRN
  Administered 2014-05-18: 16:00:00 via INTRAVENOUS

## 2014-05-18 MED ORDER — ALTEPLASE 30 MG/30 ML FOR INTERV. RAD
1.0000 mg | INTRA_ARTERIAL | Status: AC | PRN
  Filled 2014-05-18: qty 30

## 2014-05-18 MED ORDER — ALTEPLASE (STROKE) FULL DOSE INFUSION
90.0000 mg | Freq: Once | INTRAVENOUS | Status: AC
  Administered 2014-05-18: 90 mg via INTRAVENOUS
  Filled 2014-05-18: qty 90

## 2014-05-18 MED ORDER — DEXTROSE 5 % IV SOLN
10.0000 mg | INTRAVENOUS | Status: DC | PRN
Start: 2014-05-18 — End: 2014-05-18
  Administered 2014-05-18: 5 ug/min via INTRAVENOUS

## 2014-05-18 MED ORDER — CEFAZOLIN SODIUM-DEXTROSE 2-3 GM-% IV SOLR
INTRAVENOUS | Status: DC | PRN
  Administered 2014-05-18: 2 g via INTRAVENOUS

## 2014-05-18 MED ORDER — CETYLPYRIDINIUM CHLORIDE 0.05 % MT LIQD: 7.0000 mL | Freq: Four times a day (QID) | OROMUCOSAL | Status: DC

## 2014-05-18 MED ORDER — CETYLPYRIDINIUM CHLORIDE 0.05 % MT LIQD
7.0000 mL | Freq: Four times a day (QID) | OROMUCOSAL | Status: DC
  Administered 2014-05-18 – 2014-05-21 (×10): 7 mL via OROMUCOSAL

## 2014-05-18 MED ORDER — CEFAZOLIN SODIUM-DEXTROSE 2-3 GM-% IV SOLR
INTRAVENOUS | Status: AC
  Filled 2014-05-18: qty 50

## 2014-05-18 MED ORDER — ACETAMINOPHEN 650 MG RE SUPP: 650.0000 mg | RECTAL | Status: DC | PRN

## 2014-05-18 MED ORDER — NITROGLYCERIN 5 MG/ML IV SOLN
1.5000 mg | INTRAVENOUS | Status: AC
  Administered 2014-05-18: 2.5 mg via INTRA_ARTERIAL
  Administered 2014-05-18 (×2): 25 ug via INTRA_ARTERIAL

## 2014-05-18 MED ORDER — SODIUM CHLORIDE 0.9 % IV SOLN: INTRAVENOUS | Status: DC

## 2014-05-18 MED ORDER — NICARDIPINE HCL IN NACL 20-0.86 MG/200ML-% IV SOLN
5.0000 mg/h | INTRAVENOUS | Status: DC
  Administered 2014-05-18 – 2014-05-19 (×2): 5 mg/h via INTRAVENOUS
  Filled 2014-05-18 (×2): qty 200

## 2014-05-18 MED ORDER — PROPOFOL 10 MG/ML IV EMUL
0.0000 ug/kg/min | INTRAVENOUS | Status: DC
  Administered 2014-05-18: 45 ug/kg/min via INTRAVENOUS
  Administered 2014-05-18: 35 ug/kg/min via INTRAVENOUS
  Administered 2014-05-19 (×2): 20 ug/kg/min via INTRAVENOUS
  Administered 2014-05-19: 35 ug/kg/min via INTRAVENOUS
  Filled 2014-05-18 (×5): qty 100

## 2014-05-18 MED ORDER — TORSEMIDE 10 MG PO TABS: 10.0000 mg | ORAL_TABLET | Freq: Every day | ORAL | Status: DC

## 2014-05-18 MED ORDER — VERAPAMIL HCL ER 180 MG PO TBCR: 180.0000 mg | EXTENDED_RELEASE_TABLET | Freq: Every day | ORAL | Status: DC

## 2014-05-18 MED ORDER — PROPOFOL INFUSION 10 MG/ML OPTIME
INTRAVENOUS | Status: DC | PRN
  Administered 2014-05-18: 25 ug/kg/min via INTRAVENOUS

## 2014-05-18 MED ORDER — FENOFIBRATE 54 MG PO TABS: 54.0000 mg | ORAL_TABLET | Freq: Every day | ORAL | Status: DC

## 2014-05-18 MED ORDER — CARVEDILOL 6.25 MG PO TABS: 6.2500 mg | ORAL_TABLET | Freq: Two times a day (BID) | ORAL | Status: DC

## 2014-05-18 MED ORDER — ONDANSETRON HCL 4 MG/2ML IJ SOLN: 4.0000 mg | Freq: Four times a day (QID) | INTRAMUSCULAR | Status: DC | PRN

## 2014-05-18 NOTE — Procedures (Signed)
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

## 2014-05-18 NOTE — H&P (Signed)
History and physical     HPI:                                                                                                                                         Austin Warren is an 73 y.o. male who was normal this AM .  He went to the bank at 12:30 and noted he had blurred vision.  When he came home he mentioned this to his wife and she called his PCP. They were told to take his BP. As they were taking his BP he noted he could not get up from the chair and was unable to move his right side.  EMS was called and patient was brought to ED as a code stroke. On arrival he showed a left gaze gaze preference, right facial droop, right sided plegia.  Initial CT head showed no acute infarct but did demonstrate a large dense left MCA sign.  IR was contacted.  IV tPA was started and patient was brought back to IR for cerebral angiogram. NIH stroke score was 23. EKG showed atrial fibrillation, which had not previously been known.  Date last known well: Date: 05/18/2014 Time last known well: Time: 12:30 tPA Given: Yes  Past Medical History  Diagnosis Date  . Diabetes mellitus   . Hypertension   . Hyperlipidemia   . History of colon polyps   . Benign prostatic hypertrophy   . Arthritis   . Ventricular tachycardia     RBB/LAHB ideopathic VT, 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  . Melanoma     under arm  . Osteoarthritis   . Dyspnea on exertion 11/01/2011    Past Surgical History  Procedure Laterality Date  . Replacement total knee    . Vasectomy    . Joint replacement      R TKR  . Replacement total knee Left     Family History  Problem Relation Age of Onset  . Stroke Father    Social History:  reports that he has quit smoking. He has never used smokeless tobacco. He reports that he drinks alcohol. He reports that he does not use illicit drugs.  Allergies:  Allergies  Allergen Reactions  . Levofloxacin  Anaphylaxis    "throat closes"  . Ciprofloxacin     Medications:  Current Facility-Administered Medications  Medication Dose Route Frequency Provider Last Rate Last Dose  . alteplase (ACTIVASE) 1 mg/mL infusion 90 mg  90 mg Intravenous Once Blanch Media, MD 90 mL/hr at 05/18/14 1536 90 mg at 05/18/14 1536   Current Outpatient Prescriptions  Medication Sig Dispense Refill  . Ascorbic Acid (VITAMIN C) 500 MG tablet Take 500 mg by mouth daily.      Marland Kitchen aspirin 81 MG tablet Take 81 mg by mouth daily.      . carvedilol (COREG) 6.25 MG tablet Take 6.25 mg by mouth 2 (two) times daily with a meal.    . Cholecalciferol (VITAMIN D3) 1000 UNITS CAPS Take 1 capsule by mouth daily.      . fenofibrate (TRICOR) 145 MG tablet Take 145 mg by mouth daily.      . finasteride (PROSCAR) 5 MG tablet Take 5 mg by mouth daily.      . metFORMIN (GLUCOPHAGE) 500 MG tablet Take 500 mg by mouth 2 (two) times daily with a meal.      . multivitamin (THERAGRAN) per tablet Take 1 tablet by mouth daily.      . Tamsulosin HCl (FLOMAX) 0.4 MG CAPS Take 0.4 mg by mouth daily.      Marland Kitchen torsemide (DEMADEX) 10 MG tablet Take 10 mg by mouth daily.     . verapamil (CALAN-SR) 180 MG CR tablet TAKE ONE TABLET AT BEDTIME 30 tablet 3     ROS:                                                                                                                                       History obtained from the patient and wife  General ROS: negative for - chills, fatigue, fever, night sweats, weight gain or weight loss Psychological ROS: negative for - behavioral disorder, hallucinations, memory difficulties, mood swings or suicidal ideation Ophthalmic ROS: negative for - blurry vision, double vision, eye pain or loss of vision ENT ROS: negative for - epistaxis, nasal discharge, oral lesions, sore throat, tinnitus or  vertigo Allergy and Immunology ROS: negative for - hives or itchy/watery eyes Hematological and Lymphatic ROS: negative for - bleeding problems, bruising or swollen lymph nodes Endocrine ROS: negative for - galactorrhea, hair pattern changes, polydipsia/polyuria or temperature intolerance Respiratory ROS: negative for - cough, hemoptysis, shortness of breath or wheezing Cardiovascular ROS: negative for - chest pain, dyspnea on exertion, edema or irregular heartbeat Gastrointestinal ROS: negative for - abdominal pain, diarrhea, hematemesis, nausea/vomiting or stool incontinence Genito-Urinary ROS: negative for - dysuria, hematuria, incontinence or urinary frequency/urgency Musculoskeletal ROS: negative for - joint swelling or muscular weakness Neurological ROS: as noted in HPI Dermatological ROS: negative for rash and skin lesion changes  Physical Examination: Blood pressure 155/90, pulse 74, resp. rate 20, weight 107 kg (235 lb 14.3 oz), SpO2 99 %.  HEENT-  Normocephalic, no lesions, without obvious abnormality.  Normal external eye and conjunctiva.  Normal TM's bilaterally.  Normal auditory canals and external ears. Normal external nose, mucus membranes and septum.  Normal pharynx. Neck supple with no masses, nodes, nodules or enlargement. Cardiovascular - irregularly irregular rhythm, S1, S2 normal and no S3 or S4 Lungs - chest clear, no wheezing, rales, normal symmetric air entry, Heart exam - S1, S2 normal, no murmur, no gallop, rate regular Abdomen - soft, non-tender; bowel sounds normal; no masses,  no organomegaly Extremities - no joint deformities, effusion, or inflammation; pitting edema of both feet   Neurologic Examination:                                                                                                      Weight 109.1 kg (240 lb 8.4 oz). General:NAD Mental Status: Alert, oriented to hospital.  Speech dysarthric with evidence of both receptive and expressive  aphasia.  Able to follow commands on the left. Cranial Nerves: II: Discs flat bilaterally; dense right hemianopsia, pupils equal, round, reactive to light and accommodation III,IV, VI: ptosis not present, forced left gaze prefence V,VII: smile asymmetric on the right, facial light touch sensation decreased on the right VIII: hearing normal bilaterally IX,X: gag reflex present XI: bilateral shoulder shrug XII: midline tongue extension without atrophy or fasciculations  Motor: Right : Upper extremity   0/5    Left:     Upper extremity   5/5  Lower extremity   0/5     Lower extremity   5/5 Tone and bulk:normal tone throughout; no atrophy noted Sensory: Pinprick and light touch decreased on the right Deep Tendon Reflexes:  Right: Upper Extremity   Left: Upper extremity   biceps (C-5 to C-6) 2/4   biceps (C-5 to C-6) 2/4 tricep (C7) 2/4    triceps (C7) 2/4 Brachioradialis (C6) 2/4  Brachioradialis (C6) 2/4  Lower Extremity Lower Extremity  quadriceps (L-2 to L-4) 1/4   quadriceps (L-2 to L-4) 1/4 Achilles (S1) 0/4   Achilles (S1) 0/4  Plantars: Right: mute   Left: downgoing Cerebellar: normal finger-to-nose on the left,  normal heel-to-shin test on the left Gait: not tested CV: pulses palpable throughout    Lab Results: Basic Metabolic Panel:  Recent Labs Lab 05/18/14 1516  NA 138  K 3.9  CL 102  GLUCOSE 117*  BUN 18  CREATININE 1.00    Liver Function Tests: No results for input(s): AST, ALT, ALKPHOS, BILITOT, PROT, ALBUMIN in the last 168 hours. No results for input(s): LIPASE, AMYLASE in the last 168 hours. No results for input(s): AMMONIA in the last 168 hours.  CBC:  Recent Labs Lab 05/18/14 1504 05/18/14 1516  WBC 6.2  --   NEUTROABS 4.2  --   HGB 14.8 15.6  HCT 43.7 46.0  MCV 96.0  --   PLT 167  --     Cardiac Enzymes: No results for input(s): CKTOTAL, CKMB, CKMBINDEX, TROPONINI in the last 168 hours.  Lipid Panel: No results for input(s): CHOL,  TRIG, HDL, CHOLHDL, VLDL, LDLCALC in the last 168 hours.  CBG:  No results for input(s): GLUCAP in the last 168 hours.  Microbiology: Results for orders placed or performed during the hospital encounter of 11/05/10  MRSA PCR Screening     Status: None   Collection Time: 11/05/10  4:00 PM  Result Value Ref Range Status   MRSA by PCR  NEGATIVE Final    NEGATIVE        The GeneXpert MRSA Assay (FDA approved for NASAL specimens only), is one component of a comprehensive MRSA colonization surveillance program. It is not intended to diagnose MRSA infection nor to guide or monitor treatment for MRSA infections.    Coagulation Studies: No results for input(s): LABPROT, INR in the last 72 hours.  Imaging: No results found.    Etta Quill PA-C [facility] [phone]  05/18/2014, 3:34 PM   Assessment: 73 y.o. 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  tPA was initiated and patient was brought back to IR for cerebral angiogram and revascularization procedures.   Stroke Risk Factors - diabetes mellitus, hyperlipidemia and hypertension, atrial fibrillation  Plan:  1. Admission to intensive care unit following interventional radiology procedures 2. Consult CCM for ventilator and medical management 3. Continued telemetry monitoring 4. Prophylaxis after 24 hours to be determined by Stroke Team 5. Hemoglobin A1c and fasting lipid panel 6. 2-D echocardiogram 7. Physical therapy, occupational therapy and SLP consults  Patient's admission evaluation and management required complex decision-making, as well as emergency and administration of thrombolytic therapy with TPA, consultation with interventional radiologist, family counseling and admission to neuro intensive care unit. Total critical care time was 90 minutes.  Rush Farmer M.D. [facility] [phone]

## 2014-05-18 NOTE — Transfer of Care (Signed)
Immediate Anesthesia Transfer of Care Note  Patient: Austin Warren  Procedure(s) Performed: Procedure(s): RADIOLOGY WITH ANESTHESIA (N/A)  Patient Location: PACU  Anesthesia Type:General  Level of Consciousness: sedated  Airway & Oxygen Therapy: Patient remains intubated per anesthesia plan and Patient placed on Ventilator (see vital sign flow sheet for setting)  Post-op Assessment: Report given to PACU RN and Post -op Vital signs reviewed and stable  Post vital signs: Reviewed and stable  Complications: No apparent anesthesia complications

## 2014-05-18 NOTE — Anesthesia Postprocedure Evaluation (Signed)
Anesthesia Post Note  Patient: Austin Warren  Procedure(s) Performed: Procedure(s) (LRB): RADIOLOGY WITH ANESTHESIA (N/A)  Anesthesia type: general  Patient location: PACU  Post pain: Pain level controlled  Post assessment: Patient's Cardiovascular Status Stable  Last Vitals:  Filed Vitals:   05/18/14 2001  BP:   Pulse:   Temp: 36.6 C  Resp:     Post vital signs: Reviewed and stable  Level of consciousness: sedated  Complications: No apparent anesthesia complications

## 2014-05-18 NOTE — Sedation Documentation (Signed)
Right groin accessed by Dr Estanislado Pandy

## 2014-05-18 NOTE — Sedation Documentation (Signed)
Procedure complete, pt transported to CT scan via stretcher.  After CT scan, pt transported to 3100 and report given to ICU nurse.  Pulses palpated +2 bilateral pedal and post tib pulses.  Bilateral groin sites, C/D/I at this time, sheaths still in place.  Family updated regarding pt status per Dr D.

## 2014-05-18 NOTE — Progress Notes (Signed)
1747- Complete recannulization.

## 2014-05-18 NOTE — Sedation Documentation (Addendum)
Peripheral edema noted 2+

## 2014-05-18 NOTE — Progress Notes (Signed)
1734- recannulization of Left MCA

## 2014-05-18 NOTE — ED Notes (Signed)
After leaving bank, pt. C/o blurred vision; after getting home, pt. Developed rt. Sided weakness and garbled speech. vss enroute.

## 2014-05-18 NOTE — Consult Note (Signed)
PULMONARY / CRITICAL CARE MEDICINE   Name: Austin Warren MRN: [id number] DOB: 27-Jan-1941    ADMISSION DATE:  05/18/2014 CONSULTATION DATE:  05/18/2014  REFERRING MD :  Austin Warren  CHIEF COMPLAINT:  CVA s/p IR revascularization  INITIAL PRESENTATION: 73 y.o. Austin Warren brought to [facility] ED on 11/3 as code stroke.  In ED, found to have dense left MCA sign.  tPA administered and pt taken to IR for revascularization.  Pt returned to ICU on vent.     STUDIES:  CT Head 11/3 >>> dense left MCA sign. CT Head 11/3 (post IR) >>> Echo 11/4 >>>  SIGNIFICANT EVENTS: 11/3 - underwent revascularization by IR, admitted to neuro ICU   HISTORY OF PRESENT ILLNESS:  Pt is encephalopathic; therefore, this HPI is obtained from chart review. Austin Warren is a 73 y.o. M with PMH as outlined below, who was brought to [facility] ED via EMS on 11/3 for sudden onset blurred vision and right sided weakness.  Pt apparently went to the bank and while there, began having blurred vision.  Upon returning home, wife called PCP who instructed them to take pt's BP.  While taking BP, pt's symptoms progressed to right sided weakness, right facial droop, dysarthria.  EMS was dispatched. In ED, CT showed no acute infarct but did demonstrate large dense left MCA sign.  IV tPA was administered and pt was taken to IR for cerebral angiogram and revascularization.  He was intubated for the procedure and later returned to the ICU on the ventilator.  PCCM was consulted.   PAST MEDICAL HISTORY :   has a past medical history of Diabetes mellitus; Hypertension; Hyperlipidemia; History of colon polyps; Benign prostatic hypertrophy; Arthritis; Ventricular tachycardia; Melanoma; Osteoarthritis; and Dyspnea on exertion (11/01/2011).  has past surgical history that includes Replacement total knee; Vasectomy; Joint replacement; and Replacement total knee (Left). Prior to Admission medications   Medication Sig Start Date End Date Taking? Authorizing Provider   Ascorbic Acid (VITAMIN C) 500 MG tablet Take 500 mg by mouth daily.      Historical Provider, MD  aspirin 81 MG tablet Take 81 mg by mouth daily.      Historical Provider, MD  carvedilol (COREG) 6.25 MG tablet Take 6.25 mg by mouth 2 (two) times daily with a meal.    Historical Provider, MD  Cholecalciferol (VITAMIN D3) 1000 UNITS CAPS Take 1 capsule by mouth daily.      Historical Provider, MD  fenofibrate (TRICOR) 145 MG tablet Take 145 mg by mouth daily.      Historical Provider, MD  finasteride (PROSCAR) 5 MG tablet Take 5 mg by mouth daily.      Historical Provider, MD  metFORMIN (GLUCOPHAGE) 500 MG tablet Take 500 mg by mouth 2 (two) times daily with a meal.      Historical Provider, MD  multivitamin (THERAGRAN) per tablet Take 1 tablet by mouth daily.      Historical Provider, MD  Tamsulosin HCl (FLOMAX) 0.4 MG CAPS Take 0.4 mg by mouth daily.      Historical Provider, MD  torsemide (DEMADEX) 10 MG tablet Take 10 mg by mouth daily.  02/17/13   Historical Provider, MD  verapamil (CALAN-SR) 180 MG CR tablet TAKE ONE TABLET AT BEDTIME 02/23/14   Deboraha Sprang, MD   Allergies  Allergen Reactions  . Levofloxacin Anaphylaxis    "throat closes"  . Ciprofloxacin     FAMILY HISTORY:  Family History  Problem Relation Age of Onset  . Stroke  Father     SOCIAL HISTORY:  reports that he has quit smoking. He has never used smokeless tobacco. He reports that he drinks alcohol. He reports that he does not use illicit drugs.  REVIEW OF SYSTEMS:  Unable to obtain as pt is encephalopathic.  SUBJECTIVE: S/p complete revascularization of left ICA terminus, left MCA and left ACA.  VITAL SIGNS: Pulse Rate:  [74] 74 (11/03 1525) Resp:  [20] 20 (11/03 1525) BP: (155)/(90) 155/90 mmHg (11/03 1525) SpO2:  [99 %] 99 % (11/03 1525) Weight:  [107 kg (235 lb 14.3 oz)-109.1 kg (240 lb 8.4 oz)] 107 kg (235 lb 14.3 oz) (11/03 1525) HEMODYNAMICS:   VENTILATOR SETTINGS:   INTAKE / OUTPUT: Intake/Output       11/02 0701 - 11/03 0700 11/03 0701 - 11/04 0700   I.V. (mL/kg)  500 (4.7)   Total Intake(mL/kg)  500 (4.7)   Net   +500          PHYSICAL EXAMINATION: General: WDWN male, sedated on vent. Neuro: Sedated. HEENT: [name]/AT. PERRL, sclerae anicteric. Cardiovascular: RRR, no M/R/G.  Lungs: Respirations even and unlabored.  CTA bilaterally, No W/R/R.  Abdomen: BS x 4, soft, NT/ND.  Musculoskeletal: No gross deformities, no edema.  Skin: Intact, warm, no rashes.  LABS:  CBC  Recent Labs Lab 05/18/14 1504 05/18/14 1516  WBC 6.2  --   HGB 14.8 15.6  HCT 43.7 46.0  PLT 167  --    Coag's  Recent Labs Lab 05/18/14 1504  APTT 30  INR 1.11   BMET  Recent Labs Lab 05/18/14 1504 05/18/14 1516  NA 139 138  K 4.3 3.9  CL 101 102  CO2 24  --   BUN 18 18  CREATININE 0.87 1.00  GLUCOSE 115* 117*   Electrolytes  Recent Labs Lab 05/18/14 1504  CALCIUM 9.4   Sepsis Markers No results for input(s): LATICACIDVEN, PROCALCITON, O2SATVEN in the last 168 hours. ABG No results for input(s): PHART, PCO2ART, PO2ART in the last 168 hours. Liver Enzymes  Recent Labs Lab 05/18/14 1504  AST 38*  ALT 41  ALKPHOS 44  BILITOT 0.5  ALBUMIN 3.9   Cardiac Enzymes No results for input(s): TROPONINI, PROBNP in the last 168 hours. Glucose  Recent Labs Lab 05/18/14 1511  GLUCAP 107*    Imaging No results found.   ASSESSMENT / PLAN:  NEUROLOGIC A:   Acute CVA - s/p complete revascularization of total occlusion of left ICA / MCA / ACA Acute encephalopathy - due to above + sedation P:   Additional management per neuro. Sedation:  Propofol gtt / Fentanyl PRN. RASS goal: 0 to -1. Daily WUA.  PULMONARY OETT 11/3 >>> A: Acute respiratory failure - in setting of acute CVA P:   Full mechanical support, wean as able. VAP bundle. SBT in AM if able. ABG and CXR in AM.  CARDIOVASCULAR A:  New onset A.Fib ? - rate controlled Hx HTN, HLD, Vtach P:  Cards consult  in AM. No anticoagulation for now (pt received tPA). BP goals per neuro. Hold outpatient aspirin, carvedilol, fenofibrate, torsemide, verapamil.  RENAL A:   No acute issues P:   NS @ 75. BMP in AM.  GASTROINTESTINAL A:   GI prophylaxis Nutrition P:   SUP: Pantoprazole. NPO. SLP eval in AM if extubated. TF if remains NPO > 24 hours.  HEMATOLOGIC A:   VTE Prophylaxis P:  SCD's only. CBC in AM.  INFECTIOUS A:   No evidence of infection  P:   Monitor clinically.  ENDOCRINE A:   DM P:   CBG's q4hr. SSI. Hold outpatient metformin.   Family updated: At bedside.  Interdisciplinary Family Meeting v Palliative Care Meeting:  Due by: 11/10.   Montey Hora, [place] Pgr: [phone]  or [phone] 05/18/2014, 6:45 PM  Intubated for IR procedure, hemodynamic recommendations per IR, PCCM will manage vent, anticipate extubation in AM.  CC time 40 min.  Patient seen and examined, agree with above note.  I dictated the care and orders written for this patient under my direction.  Rush Farmer, MD [phone]

## 2014-05-18 NOTE — Progress Notes (Signed)
Right groin accessed by Dr. Estanislado Pandy

## 2014-05-18 NOTE — Anesthesia Procedure Notes (Signed)
Procedure Name: Intubation Date/Time: 05/18/2014 4:19 PM Performed by: Maude Leriche D Pre-anesthesia Checklist: Patient identified, Emergency Drugs available, Suction available, Patient being monitored and Timeout performed Patient Re-evaluated:Patient Re-evaluated prior to inductionOxygen Delivery Method: Circle system utilized Preoxygenation: Pre-oxygenation with 100% oxygen Intubation Type: IV induction, Rapid sequence and Cricoid Pressure applied Laryngoscope Size: Mac and 3 Grade View: Grade III Tube type: Subglottic suction tube Tube size: 7.5 mm Number of attempts: 2 (DL x 2. First attempt by MDA esophageal intubation- recognized quickly. Second DL by MDA successful with blue stylet) Airway Equipment and Method: Stylet and Bougie stylet Placement Confirmation: ETT inserted through vocal cords under direct vision,  positive ETCO2 and breath sounds checked- equal and bilateral Secured at: 24 cm Tube secured with: Tape Dental Injury: Teeth and Oropharynx as per pre-operative assessment

## 2014-05-18 NOTE — ED Provider Notes (Signed)
The patient presents to the emergency room with symptoms of an acute stroke.  A code stroke was initiated in the field by EMS. The stroke team was evaluating the patient in the emergency department on arrival Physical Exam  Wt 240 lb 8.4 oz (109.1 kg)  Physical Exam  Constitutional: No distress.  obese  HENT:  Head: Normocephalic and atraumatic.  Right Ear: External ear normal.  Left Ear: External ear normal.  Eyes: Conjunctivae are normal. Right eye exhibits no discharge. Left eye exhibits no discharge. No scleral icterus.  Neck: Neck supple. No tracheal deviation present.  Cardiovascular: Normal rate.   Pulmonary/Chest: Effort normal. No stridor. No respiratory distress.  Musculoskeletal: He exhibits no edema.  Neurological: He is alert. Cranial nerve deficit: no gross deficits.  Patient has a dense right-sided hemiparesis with aphasia and neglect, please see the full evaluation from the stroke team  Skin: Skin is warm and dry. No rash noted.  Psychiatric: He has a normal mood and affect.  Nursing note and vitals reviewed.   ED Course  Procedures  MDM Patient presented to the emergency department with an acute stroke.  Patient is a code stroke  candidate. The plan is for administration of TPA and possible vascular intervention. Patient appears to have a clot within the left MCA.  Further care per the Stroke team.  I saw and evaluated the patient, reviewed the resident's note and I agree with the findings and plan.          Dorie Rank, MD 05/18/14 [phone]

## 2014-05-18 NOTE — Sedation Documentation (Signed)
Code Stroke, to IR, anesthesia present.

## 2014-05-18 NOTE — Anesthesia Preprocedure Evaluation (Signed)
Anesthesia Evaluation  Patient identified by MRN, date of birth, ID bandPreop documentation limited or incomplete due to emergent nature of procedure.  Airway Mallampati: III  TM Distance: >3 FB   Mouth opening: Limited Mouth Opening  Dental  (+) Teeth Intact   Pulmonary former smoker,          Cardiovascular hypertension,     Neuro/Psych    GI/Hepatic   Endo/Other  diabetes  Renal/GU      Musculoskeletal   Abdominal   Peds  Hematology   Anesthesia Other Findings   Reproductive/Obstetrics                             Anesthesia Physical Anesthesia Plan  ASA: III and emergent  Anesthesia Plan: General   Post-op Pain Management:    Induction: Intravenous and Cricoid pressure planned  Airway Management Planned: Oral ETT  Additional Equipment:   Intra-op Plan:   Post-operative Plan: Post-operative intubation/ventilation  Informed Consent: I have reviewed the patients History and Physical, chart, labs and discussed the procedure including the risks, benefits and alternatives for the proposed anesthesia with the patient or authorized representative who has indicated his/her understanding and acceptance.   History available from chart only and Only emergency history available  Plan Discussed with: Anesthesiologist, CRNA and Surgeon  Anesthesia Plan Comments:         Anesthesia Quick Evaluation

## 2014-05-18 NOTE — Progress Notes (Signed)
Pt to room 3M12. Stable on vent. Propofol infusing. Pupils 2 sluggish. Left groin oozing blood. Pt in A Fib. Box notified of arrival and need for consultation.

## 2014-05-18 NOTE — ED Provider Notes (Signed)
CSN: [id number]     Arrival date & time 05/18/14  1507 History   First MD Initiated Contact with Patient 05/18/14 1525     No chief complaint on file.    (Consider location/radiation/quality/duration/timing/severity/associated sxs/prior Treatment) Patient is a 73 y.o. male presenting with Acute Neurological Problem. The history is provided by the patient and the EMS personnel. No language interpreter was used.  Cerebrovascular Accident This is a new problem. The current episode started today. The problem occurs constantly. The problem has been unchanged. Associated symptoms include a visual change and weakness. Pertinent negatives include no chest pain or nausea. Nothing aggravates the symptoms. He has tried nothing for the symptoms.    Past Medical History  Diagnosis Date  . Diabetes mellitus   . Hypertension   . Hyperlipidemia   . History of colon polyps   . Benign prostatic hypertrophy   . Arthritis   . Ventricular tachycardia     RBB/LAHB ideopathic VT, 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  . Melanoma     under arm  . Osteoarthritis   . Dyspnea on exertion 11/01/2011   Past Surgical History  Procedure Laterality Date  . Replacement total knee    . Vasectomy    . Joint replacement      R TKR  . Replacement total knee Left    Family History  Problem Relation Age of Onset  . Stroke Father    History  Substance Use Topics  . Smoking status: Former Research scientist (life sciences)  . Smokeless tobacco: Never Used  . Alcohol Use: 0.0 oz/week    3-4 Cans of beer per week     Comment: prior heavy    Review of Systems  HENT: Negative for trouble swallowing.   Eyes: Positive for visual disturbance.  Respiratory: Negative for chest tightness and shortness of breath.   Cardiovascular: Negative for chest pain.  Gastrointestinal: Negative for nausea.  Musculoskeletal: Positive for gait problem.  Neurological: Positive for speech difficulty and weakness.  All other systems reviewed and are  negative.     Allergies  Levofloxacin and Ciprofloxacin  Home Medications   Prior to Admission medications   Medication Sig Start Date End Date Taking? Authorizing Provider  Ascorbic Acid (VITAMIN C) 500 MG tablet Take 500 mg by mouth daily.      Historical Provider, MD  aspirin 81 MG tablet Take 81 mg by mouth daily.      Historical Provider, MD  carvedilol (COREG) 6.25 MG tablet Take 6.25 mg by mouth 2 (two) times daily with a meal.    Historical Provider, MD  Cholecalciferol (VITAMIN D3) 1000 UNITS CAPS Take 1 capsule by mouth daily.      Historical Provider, MD  fenofibrate (TRICOR) 145 MG tablet Take 145 mg by mouth daily.      Historical Provider, MD  finasteride (PROSCAR) 5 MG tablet Take 5 mg by mouth daily.      Historical Provider, MD  metFORMIN (GLUCOPHAGE) 500 MG tablet Take 500 mg by mouth 2 (two) times daily with a meal.      Historical Provider, MD  multivitamin (THERAGRAN) per tablet Take 1 tablet by mouth daily.      Historical Provider, MD  Tamsulosin HCl (FLOMAX) 0.4 MG CAPS Take 0.4 mg by mouth daily.      Historical Provider, MD  torsemide (DEMADEX) 10 MG tablet Take 10 mg by mouth daily.  02/17/13   Historical Provider, MD  verapamil (CALAN-SR) 180 MG CR tablet TAKE ONE TABLET AT BEDTIME  02/23/14   Deboraha Sprang, MD   Wt 240 lb 8.4 oz (109.1 kg) Physical Exam  Constitutional: He appears well-developed.  HENT:  Head: Atraumatic.  Eyes: Pupils are equal, round, and reactive to light.  Left gaze preference  Neck: Neck supple. Carotid bruit is not present.  Cardiovascular: Normal rate.  An irregular rhythm present.  No murmur heard. Pulmonary/Chest: Effort normal and breath sounds normal.  Abdominal: Soft.  Neurological: GCS eye subscore is 4. GCS verbal subscore is 3. GCS motor subscore is 6.  RUE/RLE 0/5 LUE/LLE 5/5  Skin: Skin is warm.  Vitals reviewed.   ED Course  Procedures (including critical care time) Labs Review Labs Reviewed  COMPREHENSIVE  METABOLIC PANEL - Abnormal; Notable for the following:    Glucose, Bld 115 (*)    AST 38 (*)    GFR calc non Af Amer 82 (*)    All other components within normal limits  URINALYSIS, ROUTINE W REFLEX MICROSCOPIC - Abnormal; Notable for the following:    Hgb urine dipstick SMALL (*)    All other components within normal limits  CBC WITH DIFFERENTIAL - Abnormal; Notable for the following:    RBC 4.17 (*)    Platelets 135 (*)    Neutrophils Relative % 83 (*)    Lymphocytes Relative 10 (*)    All other components within normal limits  BASIC METABOLIC PANEL - Abnormal; Notable for the following:    Glucose, Bld 146 (*)    Calcium 7.9 (*)    All other components within normal limits  TRIGLYCERIDES - Abnormal; Notable for the following:    Triglycerides 183 (*)    All other components within normal limits  GLUCOSE, CAPILLARY - Abnormal; Notable for the following:    Glucose-Capillary 143 (*)    All other components within normal limits  LIPID PANEL - Abnormal; Notable for the following:    HDL 38 (*)    All other components within normal limits  GLUCOSE, CAPILLARY - Abnormal; Notable for the following:    Glucose-Capillary 132 (*)    All other components within normal limits  GLUCOSE, CAPILLARY - Abnormal; Notable for the following:    Glucose-Capillary 122 (*)    All other components within normal limits  GLUCOSE, CAPILLARY - Abnormal; Notable for the following:    Glucose-Capillary 128 (*)    All other components within normal limits  I-STAT CHEM 8, ED - Abnormal; Notable for the following:    Glucose, Bld 117 (*)    All other components within normal limits  CBG MONITORING, ED - Abnormal; Notable for the following:    Glucose-Capillary 107 (*)    All other components within normal limits  POCT I-STAT 3, ART BLOOD GAS (G3+) - Abnormal; Notable for the following:    pH, Arterial 7.335 (*)    Acid-base deficit 3.0 (*)    All other components within normal limits  MRSA PCR  SCREENING  ETHANOL  PROTIME-INR  APTT  CBC  DIFFERENTIAL  URINE RAPID DRUG SCREEN (HOSP PERFORMED)  URINE MICROSCOPIC-ADD ON  HEMOGLOBIN A1C  BLOOD GAS, ARTERIAL  MAGNESIUM  I-STAT TROPOININ, ED  I-STAT TROPOININ, ED    Imaging Review Ct Head Wo Contrast  05/18/2014   CLINICAL DATA:  Followup evaluation status post interventional procedure  EXAM: CT HEAD WITHOUT CONTRAST  TECHNIQUE: Contiguous axial images were obtained from the base of the skull through the vertex without intravenous contrast.  COMPARISON:  CT brain 06/07/2014  FINDINGS: Ventricles and  sulci are prominent, compatible with atrophy. No definite evidence for acute intracranial hemorrhage, mass lesion or significant mass effect. Increased density along the falx and tentorium favored to represent sequelae of contrast administration. Minimal hypodensity within the left basal ganglia region however relative preservation of the gray-white differentiation within the left MCA territory. Orbits are unremarkable. Ethmoid sinus mucosal thickening. Mastoid air cells are unremarkable. Calvarium is intact.  IMPRESSION: Suggestion of minimal hypodensity within the left basal ganglia region possibly representing age indeterminate ischemic change. Overall however, there is relative preservation of the gray-white differentiation within the left MCA territory without suggestion of a large infarct visible at this time.  Increased density along the falx and tentorium is favored to represent sequelae of contrast administration.   Electronically Signed   By: Lovey Newcomer M.D.   On: 05/18/2014 19:37   Ct Head Wo Contrast  05/18/2014   CLINICAL DATA:  Code stroke.  Right-sided weakness.  Blurred vision  EXAM: CT HEAD WITHOUT CONTRAST  TECHNIQUE: Contiguous axial images were obtained from the base of the skull through the vertex without intravenous contrast.  COMPARISON:  CT head 09/04/2013  FINDINGS: Generalized atrophy. Mild chronic microvascular ischemic  change in the periventricular white matter.  Negative for acute infarct. Negative for hemorrhage or mass. Calvarium intact.  Increased density in the left terminal internal carotid artery and left MCA most compatible with thrombosis. No evidence of acute infarct.  IMPRESSION: Atrophy and minimal chronic microvascular ischemia. No acute abnormality.  Dense left MCA compatible with thrombosis.  Critical Value/emergent results were called by telephone at the time of interpretation on 05/18/2014 at 3:34 pm to Dr. Wallie Char , who verbally acknowledged these results.   Electronically Signed   By: Franchot Gallo M.D.   On: 05/18/2014 15:36   Portable Chest Xray  05/19/2014   CLINICAL DATA:  Intubated patient  EXAM: PORTABLE CHEST - 1 VIEW  COMPARISON:  Chest x-ray dated November 12, 2010  FINDINGS: The lungs are mildly hypoinflated. There is increased density in the left upper lobe. The cardiac silhouette is enlarged. The pulmonary vascularity is not engorged. There is no pleural effusion or pneumothorax. The endotracheal tube tip lies approximately 3.5 cm above the crotch of the carina. The observed bony thorax is unremarkable.  IMPRESSION: Left upper lobe infiltrate or atelectasis. Cardiomegaly without evidence of pulmonary edema.   Electronically Signed   By: David  Martinique   On: 05/19/2014 08:30     EKG Interpretation None      MDM   Final diagnoses:  Blurred vision  Stroke    73 y/o male code stroke. Last normal 12:30 pm. Onset of visual disturbance, then R sided weakness and R facial droop. Airway stable on arrival, taken to CT. CT with M1 occlusion. Neurology consented for tPA. Also noting new onset atrial fibrillation on EKG. Pt admitted to Neurology    Amparo Bristol, MD 05/19/14 1026

## 2014-05-18 NOTE — Sedation Documentation (Signed)
Report off to Vladimir Faster RN

## 2014-05-18 NOTE — Code Documentation (Signed)
73yo male arriving to [facility] via [place] at [phone].  EMS reports that the patient went to the bank and began having blurred vision. He returned home where he developed right sided weakness.  EMS activated Code Stroke.  Patient cleared by Dr. Vanita Panda on arrival and labs obtained.  Patient taken to CT.  Patient weighed and pharmacy notified to mix tPA at 1520.  Initial NIHSS 23, see documentation for details and times.  Patient with right hemianopsia and left partial gaze, right hemiplegia, right facial droop, aphasia and dysarthria, right side sensory loss and neglect.  IR notified that patient is an interventional candidate.  Pharmacy delivered tPA to bedside at 1529.  ED RN attempted foley, but unable to pass, condom cath placed.  Patient's wife reports that he was LKW at 1215 before the blurred vision occurred.  Patient had no weakness at that time.  Patient was at home playing computer games when he informed his wife that he could not get up from the chair.  Dr. Nicole Kindred discussed plan of care with patient's wife.  tPA started at 1536.  Patient transported to IR with IR staff at 1541.  Handoff with IR staff.

## 2014-05-19 ENCOUNTER — Encounter (HOSPITAL_COMMUNITY): Payer: Self-pay | Admitting: Interventional Radiology

## 2014-05-19 ENCOUNTER — Inpatient Hospital Stay (HOSPITAL_COMMUNITY): Payer: Medicare Other

## 2014-05-19 DIAGNOSIS — J9601 Acute respiratory failure with hypoxia: Secondary | ICD-10-CM

## 2014-05-19 DIAGNOSIS — I639 Cerebral infarction, unspecified: Secondary | ICD-10-CM | POA: Insufficient documentation

## 2014-05-19 DIAGNOSIS — I059 Rheumatic mitral valve disease, unspecified: Secondary | ICD-10-CM

## 2014-05-19 LAB — URINALYSIS, ROUTINE W REFLEX MICROSCOPIC
BILIRUBIN URINE: NEGATIVE
Glucose, UA: NEGATIVE mg/dL
KETONES UR: NEGATIVE mg/dL
Leukocytes, UA: NEGATIVE
Nitrite: NEGATIVE
PROTEIN: NEGATIVE mg/dL
SPECIFIC GRAVITY, URINE: 1.027 (ref 1.005–1.030)
UROBILINOGEN UA: 0.2 mg/dL (ref 0.0–1.0)
pH: 6 (ref 5.0–8.0)

## 2014-05-19 LAB — GLUCOSE, CAPILLARY
GLUCOSE-CAPILLARY: 122 mg/dL — AB (ref 70–99)
Glucose-Capillary: 120 mg/dL — ABNORMAL HIGH (ref 70–99)
Glucose-Capillary: 121 mg/dL — ABNORMAL HIGH (ref 70–99)
Glucose-Capillary: 125 mg/dL — ABNORMAL HIGH (ref 70–99)
Glucose-Capillary: 128 mg/dL — ABNORMAL HIGH (ref 70–99)
Glucose-Capillary: 132 mg/dL — ABNORMAL HIGH (ref 70–99)

## 2014-05-19 LAB — CBC WITH DIFFERENTIAL/PLATELET
BASOS PCT: 0 % (ref 0–1)
Basophils Absolute: 0 10*3/uL (ref 0.0–0.1)
Eosinophils Absolute: 0 10*3/uL (ref 0.0–0.7)
Eosinophils Relative: 0 % (ref 0–5)
HCT: 39.9 % (ref 39.0–52.0)
HEMOGLOBIN: 13.4 g/dL (ref 13.0–17.0)
Lymphocytes Relative: 10 % — ABNORMAL LOW (ref 12–46)
Lymphs Abs: 0.9 10*3/uL (ref 0.7–4.0)
MCH: 32.1 pg (ref 26.0–34.0)
MCHC: 33.6 g/dL (ref 30.0–36.0)
MCV: 95.7 fL (ref 78.0–100.0)
MONOS PCT: 7 % (ref 3–12)
Monocytes Absolute: 0.6 10*3/uL (ref 0.1–1.0)
NEUTROS ABS: 7.1 10*3/uL (ref 1.7–7.7)
Neutrophils Relative %: 83 % — ABNORMAL HIGH (ref 43–77)
Platelets: 135 10*3/uL — ABNORMAL LOW (ref 150–400)
RBC: 4.17 MIL/uL — ABNORMAL LOW (ref 4.22–5.81)
RDW: 12.7 % (ref 11.5–15.5)
WBC: 8.6 10*3/uL (ref 4.0–10.5)

## 2014-05-19 LAB — BASIC METABOLIC PANEL
Anion gap: 12 (ref 5–15)
BUN: 11 mg/dL (ref 6–23)
CHLORIDE: 102 meq/L (ref 96–112)
CO2: 23 mEq/L (ref 19–32)
CREATININE: 0.6 mg/dL (ref 0.50–1.35)
Calcium: 7.9 mg/dL — ABNORMAL LOW (ref 8.4–10.5)
GFR calc non Af Amer: >90 mL/min (ref >90)
Glucose, Bld: 146 mg/dL — ABNORMAL HIGH (ref 70–99)
Potassium: 3.8 mEq/L (ref 3.7–5.3)
Sodium: 137 mEq/L (ref 137–147)

## 2014-05-19 LAB — RAPID URINE DRUG SCREEN, HOSP PERFORMED
AMPHETAMINES: NOT DETECTED
BARBITURATES: NOT DETECTED
Benzodiazepines: NOT DETECTED
COCAINE: NOT DETECTED
Opiates: NOT DETECTED
TETRAHYDROCANNABINOL: NOT DETECTED

## 2014-05-19 LAB — LIPID PANEL
CHOL/HDL RATIO: 3.3 ratio
Cholesterol: 124 mg/dL (ref 0–200)
HDL: 38 mg/dL — ABNORMAL LOW (ref >39)
LDL CALC: 58 mg/dL (ref 0–99)
Triglycerides: 142 mg/dL (ref <150)
VLDL: 28 mg/dL (ref 0–40)

## 2014-05-19 LAB — MAGNESIUM: Magnesium: 1.8 mg/dL (ref 1.5–2.5)

## 2014-05-19 LAB — HEMOGLOBIN A1C
Hgb A1c MFr Bld: 6.3 % — ABNORMAL HIGH (ref <5.7)
MEAN PLASMA GLUCOSE: 134 mg/dL — AB (ref <117)

## 2014-05-19 LAB — MRSA PCR SCREENING: MRSA by PCR: NEGATIVE

## 2014-05-19 LAB — URINE MICROSCOPIC-ADD ON

## 2014-05-19 MED ORDER — ASPIRIN 300 MG RE SUPP
300.0000 mg | Freq: Every day | RECTAL | Status: DC
  Filled 2014-05-19 (×2): qty 1

## 2014-05-19 MED ORDER — ASPIRIN 300 MG RE SUPP
300.0000 mg | Freq: Every day | RECTAL | Status: DC
  Administered 2014-05-19 – 2014-05-20 (×2): 300 mg via RECTAL
  Filled 2014-05-19: qty 1

## 2014-05-19 NOTE — Progress Notes (Signed)
STROKE TEAM PROGRESS NOTE   HISTORY Austin WAGGLE is an 73 y.o. male who was normal this AM 05/18/2014. He went to the bank at 12:30 and noted he had blurred vision. When he came home he mentioned this to his wife and she called his PCP. They were told to take his BP. As they were taking his BP he noted he could not get up from the chair and was unable to move his right side. EMS was called and patient was brought to ED as a code stroke. On arrival he showed a left gaze gaze preference, right facial droop, right sided plegia. Initial CT head showed no acute infarct but did demonstrate a large dense left MCA sign. IR was contacted. IV tPA was started and patient was brought back to IR for cerebral angiogram. NIH stroke score was 23. EKG showed atrial fibrillation, which had not previously been known. Patient was administered IV TPA followed by cerebral angio in IR where he was found to have a T occlusion with complete revascularization of Lt ICA terminus, Lt MCA and Lt ACA with 12mg  of IA TPA, 1 pass with Trevoprovue, and 1 pass with Solitaire withTICI 3 revascularization. He was admitted to the neuro ICU for further evaluation and treatment.   SUBJECTIVE (INTERVAL HISTORY) His wife and daughter is at the bedside.  Overall he feels his condition is gradually improving.  BP adequately controlled overnight and neuro exam stable.   OBJECTIVE Temp:  [97.2 F (36.2 C)-97.9 F (36.6 C)] 97.5 F (36.4 C) (11/04 0830) Pulse Rate:  [40-117] 67 (11/04 0830) Cardiac Rhythm:  [-] Atrial fibrillation (11/03 2000) Resp:  [14-21] 16 (11/04 0831) BP: (85-155)/(55-90) 112/64 mmHg (11/04 0831) SpO2:  [93 %-100 %] 99 % (11/04 0831) Arterial Line BP: (97-145)/(54-75) 125/61 mmHg (11/04 0830) FiO2 (%):  [40 %-50 %] 40 % (11/04 0831) Weight:  [107 kg (235 lb 14.3 oz)-109.3 kg (240 lb 15.4 oz)] 109.3 kg (240 lb 15.4 oz) (11/03 2000)   Recent Labs Lab 05/18/14 1511 05/18/14 2032 05/18/14 2348  05/19/14 0347 05/19/14 0748  GLUCAP 107* 143* 132* 122* 128*    Recent Labs Lab 05/18/14 1504 05/18/14 1516 05/19/14 0510  NA 139 138 137  K 4.3 3.9 3.8  CL 101 102 102  CO2 24  --  23  GLUCOSE 115* 117* 146*  BUN 18 18 11   CREATININE 0.87 1.00 0.60  CALCIUM 9.4  --  7.9*    Recent Labs Lab 05/18/14 1504  AST 38*  ALT 41  ALKPHOS 44  BILITOT 0.5  PROT 6.9  ALBUMIN 3.9    Recent Labs Lab 05/18/14 1504 05/18/14 1516 05/19/14 0510  WBC 6.2  --  8.6  NEUTROABS 4.2  --  7.1  HGB 14.8 15.6 13.4  HCT 43.7 46.0 39.9  MCV 96.0  --  95.7  PLT 167  --  135*   No results for input(s): CKTOTAL, CKMB, CKMBINDEX, TROPONINI in the last 168 hours.  Recent Labs  05/18/14 1504  LABPROT 14.4  INR 1.11    Recent Labs  05/19/14 0605  COLORURINE YELLOW  LABSPEC 1.027  PHURINE 6.0  GLUCOSEU NEGATIVE  HGBUR SMALL*  BILIRUBINUR NEGATIVE  KETONESUR NEGATIVE  PROTEINUR NEGATIVE  UROBILINOGEN 0.2  NITRITE NEGATIVE  LEUKOCYTESUR NEGATIVE       Component Value Date/Time   CHOL 124 05/19/2014 0510   TRIG 142 05/19/2014 0510   HDL 38* 05/19/2014 0510   CHOLHDL 3.3 05/19/2014 0510   VLDL  28 05/19/2014 0510   LDLCALC 58 05/19/2014 0510   No results found for: HGBA1C    Component Value Date/Time   LABOPIA NONE DETECTED 05/19/2014 0605   COCAINSCRNUR NONE DETECTED 05/19/2014 0605   LABBENZ NONE DETECTED 05/19/2014 0605   AMPHETMU NONE DETECTED 05/19/2014 0605   THCU NONE DETECTED 05/19/2014 0605   LABBARB NONE DETECTED 05/19/2014 0605     Recent Labs Lab 05/18/14 1504  ETH <11    Ct Head Wo Contrast 05/18/2014 Suggestion of minimal hypodensity within the left basal ganglia region possibly representing age indeterminate ischemic change. Overall however, there is relative preservation of the gray-white differentiation within the left MCA territory without suggestion of a large infarct visible at this time. Increased density along the falx and tentorium is  favored to represent sequelae of contrast administration.  05/18/2014 Atrophy and minimal chronic microvascular ischemia. No acute abnormality. Dense left MCA compatible with thrombosis.   Cerebral Angio  05/18/2014 S/P bilateral common carotid arteriograms , followed by complete revascularization of T occlusion of Lt ICA terminus ,Lt MCA and Lt ACA with 12mg  of superselective IA TPA , and xi pass with Trevoprovue 4 x 30 mm ,and x 1 pass with Solitaire FR 4 mm x 40 mm with TICI 3 revascularization   PHYSICAL EXAM Elderly Caucasian male intubated sedated on propofol drip  Afebrile. Head is nontraumatic. Neck is supple without bruit.  . Cardiac exam no murmur or gallop. Lungs are clear to auscultation. Distal pulses are well felt. Bilateral groin arterial sheaths present. Neurological Exam ; drowsy but can be aroused. Opens eyes. Extraocular movements are sluggish but full range. Blinks to threat on both sides. Mild right lower facial asymmetry. Tongue midline. Follows commands consistently in midline and on the left but not on the right side. Moves left upper extremity against gravity without drift. Both lower extremity exam is limited due to arterial groin sheaths but has antigravity strength on the left. Right lower extremity is weak but can move side to side at least. Right upper extremity is flaccid hypotonic and plegic. Decreased sensation on the right hemibody. Right plantar upgoing left downgoing. Coordination and gait cannot be tested. NIHSS 23 ASSESSMENT/PLAN Austin Warren is a 73 y.o. male presenting with left gaze preference, right facial droop, right sided plegia. He did receive IV t-PA 05/18/2014 at 1536. He was then taken to IR for a bilateral cerebral angio where he was found to have a T occlusion with complete revascularization of Lt ICA terminus, Lt MCA and Lt ACA with 12mg  of IA TPA, 1 pass with Trevoprovue, and 1 pass with Solitaire with resultantTICI 3  revascularization.  Stroke: Dominant left MCA infarct s/p IV tPA, IA tPA and mechanical revasculaization of T occlusion. Infarct embolic secondary to newly diagnosed atrial fibrillation  S/P bilateral common carotid arteriograms , followed by  Resultant VDRF, left gaze preference, right hemiparesis  MRI pending  Canceled MRA and Carotid Doppler given cerebral angio  2D Echo pending  SCDs for VTE prophylaxis  Diet NPO time specified   aspirin 81 mg orally every day prior to admission, now on no antithrombotics as within 24h of tPA  Ongoing aggressive risk factor management  Therapy recommendations:  pending   Disposition:  pending   Hypertension  Home meds:   Verapamil, coreg  Stable  Goal per Dr. Estanislado Pandy post intervention  Hyperlipidemia  Home meds:  Tricor, order resumed in hospital, however pt unable to swallow at this time  LDL 58, at  goal < 70  Continue statin at discharge  Diabetes  Home meds:  Glucophage  HgbA1c pending  goal < 7.0  Other Stroke Risk Factors Advanced age ETOH use . Obesity, Body mass index is 36.65 kg/(m^2).  Marland Kitchen Family hx stroke (father)  Other Active Problems  BPH - on mult home meds. Has foley now.  Ventricular tachycardia  Other Pertinent History  Hx melanoma  Hospital day # 1  Plan Dc groin sheaths then mobilize up in bed. Check MRI or Ct this afternoon and consider extubation if tolerated. Aspirin if no bleed. Anticoagulation with NOAC in a few days when able to swallow. I had a long discussion with patient and his wife and daughter regarding his stroke, examination findings, personally reviewed and discussed imaging studies, plan for further evaluation, treatment and answered questions. This patient is critically ill and at significant risk of neurological worsening, death and care requires constant monitoring of vital signs, hemodynamics,respiratory and cardiac monitoring,review of multiple databases, neurological  assessment, discussion with family, other specialists and medical decision making of high complexity.I have made any additions or clarifications directly to the above note.  I spent 30 minutes of neurocritical care time  in the care of  this patient. Antony Contras, MD   To contact Stroke Continuity provider, please refer to [email or web address] After hours, contact General Neurology

## 2014-05-19 NOTE — Progress Notes (Signed)
[facility] Progress Note Patient Name: Austin Warren DOB: 1941-07-07 MRN: [id number]   Date of Service  05/19/2014  HPI/Events of Note  Tolerating 5/5, good TV, MRI reviewed  eICU Interventions  Extubated to Page, breathing OK on rpt camera chk     Intervention Category Major Interventions: Respiratory failure - evaluation and management  ALVA,RAKESH V. 05/19/2014, 4:55 PM

## 2014-05-19 NOTE — Progress Notes (Signed)
SLP Cancellation Note  Patient Details Name: Austin Warren MRN: [id number] DOB: 16-Aug-1940   Cancelled treatment:       Reason Eval/Treat Not Completed: Patient not medically ready. Pt is intubated, will follow for readiness.    Finlay Godbee, Katherene Ponto 05/19/2014, 7:20 AM

## 2014-05-19 NOTE — Procedures (Signed)
Extubation Procedure Note  Patient Details:   Name: LIONEL WOODBERRY DOB: 1941/07/14 MRN: [id number]   Airway Documentation:     Evaluation  O2 sats: stable throughout Complications: No apparent complications Patient did tolerate procedure well. Bilateral Breath Sounds: Clear, Diminished Suctioning: Oral, Airway Yes  Pt was extubated to 4 LPM nasal cannula with humidification. Positive cuff leak before extubation. Pt had a good strong productive cough. Pt has clear; diminished BBS. Pt did have slight wheezing in upper airway but no stridor noted. Pt tolerated well. RT will continue to monitor. RN at bedside.  Omaree Fuqua M 05/19/2014, 4:54 PM

## 2014-05-19 NOTE — Consult Note (Signed)
Cardiologist:  Caryl Comes.  Last seen 03/09/14 Reason for Consult:  New Afib Referring Physician:   JEFFREY GRAEFE is an 73 y.o. male.  HPI:   The patient is a 73 yo male with a history of VT.  He had a cardiac cath which revealed normal coronary arteries and LV function.  He was initially started on beta blocker but was changed to verapamil without significant recurrence.  His history also includes DM, HTN, HLD, melanoma, OA, BPH.  His echocardiogram this admission reveals EF of 50-55%, Normal wall motion, mild concentric hypertrophy, mild AI, mild LA dilation, peak PA pressure 51mHg.   He presentd with a stroke.  CT head revealed dense left MCA compatible with thrombosis.  He was given IV tPA.   We are asked to evaluate for new atrial fib.  Review of telemetry reveals he is mostly in atrial flutter with some afib.  He also had a 32 beat run of wide complex VT at 0659hrs this morning.  His wife was present and answered questions.  She said prior to him developing blurry vision yesterday, which then progressed to severe weakness, he did not complain of anything in particular.  He did get SOB but no more than usual. No N,V, CP or dizziness.    Past Medical History  Diagnosis Date  . Diabetes mellitus   . Hypertension   . Hyperlipidemia   . History of colon polyps   . Benign prostatic hypertrophy   . Arthritis   . Ventricular tachycardia     RBB/LAHB ideopathic VT, 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  . Melanoma     under arm  . Osteoarthritis   . Dyspnea on exertion 11/01/2011    Past Surgical History  Procedure Laterality Date  . Replacement total knee    . Vasectomy    . Joint replacement      R TKR  . Replacement total knee Left     Family History  Problem Relation Age of Onset  . Stroke Father     Social History:  reports that he has quit smoking. He has never used smokeless tobacco. He reports that he drinks alcohol. He reports that he does not use illicit drugs.  Allergies:    Allergies  Allergen Reactions  . Ciprofloxacin Anaphylaxis  . Levofloxacin Anaphylaxis    "throat closes"  . Sulfa Antibiotics Rash    Medications: Scheduled Meds: .  stroke: mapping our early stages of recovery book   Does not apply Once  . antiseptic oral rinse  7 mL Mouth Rinse QID  . chlorhexidine  15 mL Mouth Rinse BID  . insulin aspart  0-15 Units Subcutaneous 6 times per day  . pantoprazole (PROTONIX) IV  40 mg Intravenous Daily   Continuous Infusions: . sodium chloride 75 mL/hr at 05/19/14 0603  . sodium chloride    . niCARDipine 5 mg/hr (05/19/14 0949)  . propofol 20 mcg/kg/min (05/19/14 0923)   PRN Meds:.acetaminophen **OR** acetaminophen, fentaNYL, labetalol, ondansetron (ZOFRAN) IV, senna-docusate  Results for orders placed or performed during the hospital encounter of 05/18/14 (from the past 48 hour(s))  Ethanol     Status: None   Collection Time: 05/18/14  3:04 PM  Result Value Ref Range   Alcohol, Ethyl (B) <11 0 - 11 mg/dL    Comment:        LOWEST DETECTABLE LIMIT FOR SERUM ALCOHOL IS 11 mg/dL FOR MEDICAL PURPOSES ONLY   Protime-INR     Status: None  Collection Time: 05/18/14  3:04 PM  Result Value Ref Range   Prothrombin Time 14.4 11.6 - 15.2 seconds   INR 1.11 0.00 - 1.49  APTT     Status: None   Collection Time: 05/18/14  3:04 PM  Result Value Ref Range   aPTT 30 24 - 37 seconds  CBC     Status: None   Collection Time: 05/18/14  3:04 PM  Result Value Ref Range   WBC 6.2 4.0 - 10.5 K/uL   RBC 4.55 4.22 - 5.81 MIL/uL   Hemoglobin 14.8 13.0 - 17.0 g/dL   HCT 43.7 39.0 - 52.0 %   MCV 96.0 78.0 - 100.0 fL   MCH 32.5 26.0 - 34.0 pg   MCHC 33.9 30.0 - 36.0 g/dL   RDW 12.8 11.5 - 15.5 %   Platelets 167 150 - 400 K/uL  Differential     Status: None   Collection Time: 05/18/14  3:04 PM  Result Value Ref Range   Neutrophils Relative % 67 43 - 77 %   Neutro Abs 4.2 1.7 - 7.7 K/uL   Lymphocytes Relative 21 12 - 46 %   Lymphs Abs 1.3 0.7 - 4.0  K/uL   Monocytes Relative 8 3 - 12 %   Monocytes Absolute 0.5 0.1 - 1.0 K/uL   Eosinophils Relative 3 0 - 5 %   Eosinophils Absolute 0.2 0.0 - 0.7 K/uL   Basophils Relative 1 0 - 1 %   Basophils Absolute 0.0 0.0 - 0.1 K/uL  Comprehensive metabolic panel     Status: Abnormal   Collection Time: 05/18/14  3:04 PM  Result Value Ref Range   Sodium 139 137 - 147 mEq/L   Potassium 4.3 3.7 - 5.3 mEq/L   Chloride 101 96 - 112 mEq/L   CO2 24 19 - 32 mEq/L   Glucose, Bld 115 (H) 70 - 99 mg/dL   BUN 18 6 - 23 mg/dL   Creatinine, Ser 0.87 0.50 - 1.35 mg/dL   Calcium 9.4 8.4 - 10.5 mg/dL   Total Protein 6.9 6.0 - 8.3 g/dL   Albumin 3.9 3.5 - 5.2 g/dL   AST 38 (H) 0 - 37 U/L   ALT 41 0 - 53 U/L   Alkaline Phosphatase 44 39 - 117 U/L   Total Bilirubin 0.5 0.3 - 1.2 mg/dL   GFR calc non Af Amer 82 (L) >90 mL/min   GFR calc Af Amer >90 >90 mL/min    Comment: (NOTE) The eGFR has been calculated using the CKD EPI equation. This calculation has not been validated in all clinical situations. eGFR's persistently <90 mL/min signify possible Chronic Kidney Disease.    Anion gap 14 5 - 15  CBG monitoring, ED     Status: Abnormal   Collection Time: 05/18/14  3:11 PM  Result Value Ref Range   Glucose-Capillary 107 (H) 70 - 99 mg/dL  I-Stat Troponin, ED (not at [facility])     Status: None   Collection Time: 05/18/14  3:14 PM  Result Value Ref Range   Troponin i, poc 0.03 0.00 - 0.08 ng/mL   Comment 3            Comment: Due to the release kinetics of cTnI, a negative result within the first hours of the onset of symptoms does not rule out myocardial infarction with certainty. If myocardial infarction is still suspected, repeat the test at appropriate intervals.   I-Stat Chem 8, ED  Status: Abnormal   Collection Time: 05/18/14  3:16 PM  Result Value Ref Range   Sodium 138 137 - 147 mEq/L   Potassium 3.9 3.7 - 5.3 mEq/L   Chloride 102 96 - 112 mEq/L   BUN 18 6 - 23 mg/dL   Creatinine, Ser 1.00  0.50 - 1.35 mg/dL   Glucose, Bld 117 (H) 70 - 99 mg/dL   Calcium, Ion 1.16 1.13 - 1.30 mmol/L   TCO2 23 0 - 100 mmol/L   Hemoglobin 15.6 13.0 - 17.0 g/dL   HCT 46.0 39.0 - 52.0 %  I-STAT 3, arterial blood gas (G3+)     Status: Abnormal   Collection Time: 05/18/14  8:11 PM  Result Value Ref Range   pH, Arterial 7.335 (L) 7.350 - 7.450   pCO2 arterial 43.3 35.0 - 45.0 mmHg   pO2, Arterial 90.0 80.0 - 100.0 mmHg   Bicarbonate 23.3 20.0 - 24.0 mEq/L   TCO2 25 0 - 100 mmol/L   O2 Saturation 97.0 %   Acid-base deficit 3.0 (H) 0.0 - 2.0 mmol/L   Patient temperature 97.5 F    Collection site ARTERIAL LINE    Drawn by RT    Sample type ARTERIAL   Glucose, capillary     Status: Abnormal   Collection Time: 05/18/14  8:32 PM  Result Value Ref Range   Glucose-Capillary 143 (H) 70 - 99 mg/dL  Triglycerides     Status: Abnormal   Collection Time: 05/18/14  9:07 PM  Result Value Ref Range   Triglycerides 183 (H) <150 mg/dL  MRSA PCR Screening     Status: None   Collection Time: 05/18/14  9:08 PM  Result Value Ref Range   MRSA by PCR NEGATIVE NEGATIVE    Comment:        The GeneXpert MRSA Assay (FDA approved for NASAL specimens only), is one component of a comprehensive MRSA colonization surveillance program. It is not intended to diagnose MRSA infection nor to guide or monitor treatment for MRSA infections.   Glucose, capillary     Status: Abnormal   Collection Time: 05/18/14 11:48 PM  Result Value Ref Range   Glucose-Capillary 132 (H) 70 - 99 mg/dL  Glucose, capillary     Status: Abnormal   Collection Time: 05/19/14  3:47 AM  Result Value Ref Range   Glucose-Capillary 122 (H) 70 - 99 mg/dL  CBC WITH DIFFERENTIAL     Status: Abnormal   Collection Time: 05/19/14  5:10 AM  Result Value Ref Range   WBC 8.6 4.0 - 10.5 K/uL   RBC 4.17 (L) 4.22 - 5.81 MIL/uL   Hemoglobin 13.4 13.0 - 17.0 g/dL   HCT 39.9 39.0 - 52.0 %   MCV 95.7 78.0 - 100.0 fL   MCH 32.1 26.0 - 34.0 pg   MCHC  33.6 30.0 - 36.0 g/dL   RDW 12.7 11.5 - 15.5 %   Platelets 135 (L) 150 - 400 K/uL   Neutrophils Relative % 83 (H) 43 - 77 %   Neutro Abs 7.1 1.7 - 7.7 K/uL   Lymphocytes Relative 10 (L) 12 - 46 %   Lymphs Abs 0.9 0.7 - 4.0 K/uL   Monocytes Relative 7 3 - 12 %   Monocytes Absolute 0.6 0.1 - 1.0 K/uL   Eosinophils Relative 0 0 - 5 %   Eosinophils Absolute 0.0 0.0 - 0.7 K/uL   Basophils Relative 0 0 - 1 %   Basophils Absolute 0.0 0.0 - 0.1  K/uL  Basic metabolic panel     Status: Abnormal   Collection Time: 05/19/14  5:10 AM  Result Value Ref Range   Sodium 137 137 - 147 mEq/L   Potassium 3.8 3.7 - 5.3 mEq/L   Chloride 102 96 - 112 mEq/L   CO2 23 19 - 32 mEq/L   Glucose, Bld 146 (H) 70 - 99 mg/dL   BUN 11 6 - 23 mg/dL   Creatinine, Ser 0.60 0.50 - 1.35 mg/dL    Comment: DELTA CHECK NOTED   Calcium 7.9 (L) 8.4 - 10.5 mg/dL   GFR calc non Af Amer >90 >90 mL/min   GFR calc Af Amer >90 >90 mL/min    Comment: (NOTE) The eGFR has been calculated using the CKD EPI equation. This calculation has not been validated in all clinical situations. eGFR's persistently <90 mL/min signify possible Chronic Kidney Disease.    Anion gap 12 5 - 15  Lipid panel     Status: Abnormal   Collection Time: 05/19/14  5:10 AM  Result Value Ref Range   Cholesterol 124 0 - 200 mg/dL   Triglycerides 142 <150 mg/dL   HDL 38 (L) >39 mg/dL   Total CHOL/HDL Ratio 3.3 RATIO   VLDL 28 0 - 40 mg/dL   LDL Cholesterol 58 0 - 99 mg/dL    Comment:        Total Cholesterol/HDL:CHD Risk Coronary Heart Disease Risk Table                     Men   Women  1/2 Average Risk   3.4   3.3  Average Risk       5.0   4.4  2 X Average Risk   9.6   7.1  3 X Average Risk  23.4   11.0        Use the calculated Patient Ratio above and the CHD Risk Table to determine the patient's CHD Risk.        ATP III CLASSIFICATION (LDL):  <100     mg/dL   Optimal  100-129  mg/dL   Near or Above                    Optimal  130-159   mg/dL   Borderline  160-189  mg/dL   High  >190     mg/dL   Very High   Urine Drug Screen     Status: None   Collection Time: 05/19/14  6:05 AM  Result Value Ref Range   Opiates NONE DETECTED NONE DETECTED   Cocaine NONE DETECTED NONE DETECTED   Benzodiazepines NONE DETECTED NONE DETECTED   Amphetamines NONE DETECTED NONE DETECTED   Tetrahydrocannabinol NONE DETECTED NONE DETECTED   Barbiturates NONE DETECTED NONE DETECTED    Comment:        DRUG SCREEN FOR MEDICAL PURPOSES ONLY.  IF CONFIRMATION IS NEEDED FOR ANY PURPOSE, NOTIFY LAB WITHIN 5 DAYS.        LOWEST DETECTABLE LIMITS FOR URINE DRUG SCREEN Drug Class       Cutoff (ng/mL) Amphetamine      1000 Barbiturate      200 Benzodiazepine   678 Tricyclics       938 Opiates          300 Cocaine          300 THC              50  Urinalysis, Routine w reflex microscopic     Status: Abnormal   Collection Time: 05/19/14  6:05 AM  Result Value Ref Range   Color, Urine YELLOW YELLOW   APPearance CLEAR CLEAR   Specific Gravity, Urine 1.027 1.005 - 1.030   pH 6.0 5.0 - 8.0   Glucose, UA NEGATIVE NEGATIVE mg/dL   Hgb urine dipstick SMALL (A) NEGATIVE   Bilirubin Urine NEGATIVE NEGATIVE   Ketones, ur NEGATIVE NEGATIVE mg/dL   Protein, ur NEGATIVE NEGATIVE mg/dL   Urobilinogen, UA 0.2 0.0 - 1.0 mg/dL   Nitrite NEGATIVE NEGATIVE   Leukocytes, UA NEGATIVE NEGATIVE  Urine microscopic-add on     Status: None   Collection Time: 05/19/14  6:05 AM  Result Value Ref Range   Squamous Epithelial / LPF RARE RARE   RBC / HPF 7-10 <3 RBC/hpf   Bacteria, UA RARE RARE  Glucose, capillary     Status: Abnormal   Collection Time: 05/19/14  7:48 AM  Result Value Ref Range   Glucose-Capillary 128 (H) 70 - 99 mg/dL   Comment 1 Notify RN    Comment 2 Documented in Chart     Ct Head Wo Contrast  05/18/2014   CLINICAL DATA:  Followup evaluation status post interventional procedure  EXAM: CT HEAD WITHOUT CONTRAST  TECHNIQUE: Contiguous axial  images were obtained from the base of the skull through the vertex without intravenous contrast.  COMPARISON:  CT brain 06/07/2014  FINDINGS: Ventricles and sulci are prominent, compatible with atrophy. No definite evidence for acute intracranial hemorrhage, mass lesion or significant mass effect. Increased density along the falx and tentorium favored to represent sequelae of contrast administration. Minimal hypodensity within the left basal ganglia region however relative preservation of the gray-white differentiation within the left MCA territory. Orbits are unremarkable. Ethmoid sinus mucosal thickening. Mastoid air cells are unremarkable. Calvarium is intact.  IMPRESSION: Suggestion of minimal hypodensity within the left basal ganglia region possibly representing age indeterminate ischemic change. Overall however, there is relative preservation of the gray-white differentiation within the left MCA territory without suggestion of a large infarct visible at this time.  Increased density along the falx and tentorium is favored to represent sequelae of contrast administration.   Electronically Signed   By: Lovey Newcomer M.D.   On: 05/18/2014 19:37   Ct Head Wo Contrast  05/18/2014   CLINICAL DATA:  Code stroke.  Right-sided weakness.  Blurred vision  EXAM: CT HEAD WITHOUT CONTRAST  TECHNIQUE: Contiguous axial images were obtained from the base of the skull through the vertex without intravenous contrast.  COMPARISON:  CT head 09/04/2013  FINDINGS: Generalized atrophy. Mild chronic microvascular ischemic change in the periventricular white matter.  Negative for acute infarct. Negative for hemorrhage or mass. Calvarium intact.  Increased density in the left terminal internal carotid artery and left MCA most compatible with thrombosis. No evidence of acute infarct.  IMPRESSION: Atrophy and minimal chronic microvascular ischemia. No acute abnormality.  Dense left MCA compatible with thrombosis.  Critical Value/emergent  results were called by telephone at the time of interpretation on 05/18/2014 at 3:34 pm to Dr. Wallie Char , who verbally acknowledged these results.   Electronically Signed   By: Franchot Gallo M.D.   On: 05/18/2014 15:36   Portable Chest Xray  05/19/2014   CLINICAL DATA:  Intubated patient  EXAM: PORTABLE CHEST - 1 VIEW  COMPARISON:  Chest x-ray dated November 12, 2010  FINDINGS: The lungs are mildly hypoinflated. There is increased density  in the left upper lobe. The cardiac silhouette is enlarged. The pulmonary vascularity is not engorged. There is no pleural effusion or pneumothorax. The endotracheal tube tip lies approximately 3.5 cm above the crotch of the carina. The observed bony thorax is unremarkable.  IMPRESSION: Left upper lobe infiltrate or atelectasis. Cardiomegaly without evidence of pulmonary edema.   Electronically Signed   By: David  [place]   On: 05/19/2014 08:30    Review of Systems  Unable to perform ROS: medical condition   Blood pressure 112/64, pulse 67, temperature 97.5 F (36.4 C), temperature source Axillary, resp. rate 16, height 5' 8"  (1.727 m), weight 240 lb 15.4 oz (109.3 kg), SpO2 99 %. Physical Exam  Constitutional: He appears well-developed.  obese  HENT:  Head: Normocephalic and atraumatic.  Cardiovascular: Normal rate, S1 normal and S2 normal.  An irregularly irregular rhythm present.  No murmur heard. Pulses:      Radial pulses are 2+ on the right side, and 2+ on the left side.  Respiratory: He has no wheezes. He has no rales.  +Rhonchi  GI: Soft. Bowel sounds are normal. He exhibits no distension.  Musculoskeletal: He exhibits no edema.  Neurological:  Intubated and sedated  Skin: Skin is warm and dry.  He has been intercurrently extubated with RHP and some speech difficulties  Assessment/Plan: Active Problems:   Atrial fibrillation, new onset   CVA (cerebral vascular accident)   Essential hypertension   Diabetes mellitus   Stroke    VT  Plan:   73 yo male with a history of VT controlled on verapamil, DM, HTN, HLD, melanoma, OA, BPH.  He had a cardiac cath April 2012 which revealed normal coronary arteries and LV function.  Presented with CVA.   His echocardiogram this admission reveals EF of 50-55%, normal wall motion.  He is mostly in atrial flutter(~5:1 on the EKG) but now more like 8:1.  Ventricular rate is controlled-70's.  He also had a 32 beat run of wide VT at 0659hrs this morning.  Will check a mag.  On IV Nicardipine.   CHADSVASC = 6.  MRI pending.  He will need anticoagulation with NOAC and results will determine when.  TEE if cardioversion.    BP controlled.    Tarri Fuller, [place] 05/19/2014, 9:04 AM   We should resume verapamil when possible  NOACs per neuro Sx of SOB which have been present for bourt aa month may be 2/2 afib and his functional status may drive need for cardioversion prior to 3 weeks of anticoagulation, althugh i doubt it

## 2014-05-19 NOTE — Progress Notes (Signed)
  Echocardiogram 2D Echocardiogram has been performed.  Darlina Sicilian M 05/19/2014, 9:41 AM

## 2014-05-19 NOTE — Progress Notes (Signed)
05/19/14 1015 Interventional Radiology here at bedside at remove bilateral groin sheaths.  Pt id'ed by name and DOB.  Rt 9Fr groin sheath pulled at 1015, ExoSeal used and pressure held for 34mins until hemostasis achieved.  V-Pad applied with tegaderm, pressure dressing and sandbag also applied.  Lt 4Fr groin sheath pulled at 1045, V-Pad applied , pressure held for 20 mins hemostasis achieved, tegaderm applied along with pressure dressing.  Bethany RN at bedside. DP distal pulses +3 bilat.  jkc/kgh

## 2014-05-19 NOTE — Progress Notes (Signed)
Referring Phy sician(s): Dr Nicole Kindred  Subjective:  CVA L ICA; L MCA; L ACA recanalization IA tpa/clot retrieval Intubated Responding to my voice follows some commands No movement on Rt for me  Allergies: Ciprofloxacin; Levofloxacin; and Sulfa antibiotics  Medications: Prior to Admission medications   Medication Sig Start Date End Date Taking? Authorizing Provider  Ascorbic Acid (VITAMIN C) 500 MG tablet Take 500 mg by mouth daily.     Yes Historical Provider, MD  aspirin EC 81 MG tablet Take 81 mg by mouth daily.   Yes Historical Provider, MD  carvedilol (COREG) 6.25 MG tablet Take 6.25 mg by mouth 2 (two) times daily with a meal.   Yes Historical Provider, MD  Cholecalciferol (VITAMIN D3) 1000 UNITS CAPS Take 1,000 Units by mouth daily.    Yes Historical Provider, MD  fenofibrate (TRICOR) 145 MG tablet Take 145 mg by mouth daily.     Yes Historical Provider, MD  finasteride (PROSCAR) 5 MG tablet Take 5 mg by mouth daily.     Yes Historical Provider, MD  metFORMIN (GLUCOPHAGE) 500 MG tablet Take 500 mg by mouth 2 (two) times daily with a meal.     Yes Historical Provider, MD  Multiple Vitamin (MULTIVITAMIN WITH MINERALS) TABS tablet Take 1 tablet by mouth daily. One A Day 50+   Yes Historical Provider, MD  Tamsulosin HCl (FLOMAX) 0.4 MG CAPS Take 0.4 mg by mouth daily after breakfast.    Yes Historical Provider, MD  torsemide (DEMADEX) 10 MG tablet Take 10 mg by mouth daily.  02/17/13  Yes Historical Provider, MD  verapamil (CALAN-SR) 180 MG CR tablet Take 180 mg by mouth at bedtime.   Yes Historical Provider, MD    Review of Systems  Vital Signs: BP 121/75 mmHg  Pulse 69  Temp(Src) 97.5 F (36.4 C) (Axillary)  Resp 18  Ht 5\' 8"  (1.727 m)  Wt 109.3 kg (240 lb 15.4 oz)  BMI 36.65 kg/m2  SpO2 95%  Physical Exam  Abdominal:  B groin sites with sheaths intact B soft; NT; no bleeding Clean and dry No hematoma   Musculoskeletal:  L side has movement to command Both  foot and arm R side has no movement that I can see B feet with 2+ pulses  Opens eyes to command intubated     Imaging: Ct Head Wo Contrast  05/18/2014   CLINICAL DATA:  Followup evaluation status post interventional procedure  EXAM: CT HEAD WITHOUT CONTRAST  TECHNIQUE: Contiguous axial images were obtained from the base of the skull through the vertex without intravenous contrast.  COMPARISON:  CT brain 06/07/2014  FINDINGS: Ventricles and sulci are prominent, compatible with atrophy. No definite evidence for acute intracranial hemorrhage, mass lesion or significant mass effect. Increased density along the falx and tentorium favored to represent sequelae of contrast administration. Minimal hypodensity within the left basal ganglia region however relative preservation of the gray-white differentiation within the left MCA territory. Orbits are unremarkable. Ethmoid sinus mucosal thickening. Mastoid air cells are unremarkable. Calvarium is intact.  IMPRESSION: Suggestion of minimal hypodensity within the left basal ganglia region possibly representing age indeterminate ischemic change. Overall however, there is relative preservation of the gray-white differentiation within the left MCA territory without suggestion of a large infarct visible at this time.  Increased density along the falx and tentorium is favored to represent sequelae of contrast administration.   Electronically Signed   By: Lovey Newcomer M.D.   On: 05/18/2014 19:37   Ct Head  Wo Contrast  05/18/2014   CLINICAL DATA:  Code stroke.  Right-sided weakness.  Blurred vision  EXAM: CT HEAD WITHOUT CONTRAST  TECHNIQUE: Contiguous axial images were obtained from the base of the skull through the vertex without intravenous contrast.  COMPARISON:  CT head 09/04/2013  FINDINGS: Generalized atrophy. Mild chronic microvascular ischemic change in the periventricular white matter.  Negative for acute infarct. Negative for hemorrhage or mass. Calvarium  intact.  Increased density in the left terminal internal carotid artery and left MCA most compatible with thrombosis. No evidence of acute infarct.  IMPRESSION: Atrophy and minimal chronic microvascular ischemia. No acute abnormality.  Dense left MCA compatible with thrombosis.  Critical Value/emergent results were called by telephone at the time of interpretation on 05/18/2014 at 3:34 pm to Dr. Wallie Char , who verbally acknowledged these results.   Electronically Signed   By: Franchot Gallo M.D.   On: 05/18/2014 15:36    Labs:  CBC:  Recent Labs  05/18/14 1504 05/18/14 1516 05/19/14 0510  WBC 6.2  --  8.6  HGB 14.8 15.6 13.4  HCT 43.7 46.0 39.9  PLT 167  --  135*    COAGS:  Recent Labs  05/18/14 1504  INR 1.11  APTT 30    BMP:  Recent Labs  05/18/14 1504 05/18/14 1516 05/19/14 0510  NA 139 138 137  K 4.3 3.9 3.8  CL 101 102 102  CO2 24  --  23  GLUCOSE 115* 117* 146*  BUN 18 18 11   CALCIUM 9.4  --  7.9*  CREATININE 0.87 1.00 0.60  GFRNONAA 82*  --  >90  GFRAA >90  --  >90    LIVER FUNCTION TESTS:  Recent Labs  05/18/14 1504  BILITOT 0.5  AST 38*  ALT 41  ALKPHOS 44  PROT 6.9  ALBUMIN 3.9    Assessment and Plan:  CVA Post recanalization of L ICA;L MCA; L ACA artery after IA tpa and clot retrieval in IR Pt on vent Following movement commands on Left No movement on Rt Will report to Dr Estanislado Pandy    I spent a total of 15 minutes face to face in clinical consultation/evaluation, greater than 50% of which was counseling/coordinating care for IA tpa;clot retrieval with recanalization of L ICA; L MCA; L ACA  Signed: Shery Waun[place]a A 05/19/2014, 8:01 AM

## 2014-05-19 NOTE — Progress Notes (Addendum)
PULMONARY / CRITICAL CARE MEDICINE   Name: Austin Warren MRN: [id number] DOB: [birth year]    ADMISSION DATE:  05/18/2014 CONSULTATION DATE:  05/19/2014  REFERRING MD :  Austin Warren  CHIEF COMPLAINT:  CVA s/p IR revascularization  INITIAL PRESENTATION: 73 y.o. Austin Warren brought to [facility] ED on 11/3 as code stroke.  In ED, found to have dense left MCA sign.  tPA administered and pt taken to IR for revascularization.  Pt returned to ICU on vent.     STUDIES:  CT Head 11/3 >>> dense left MCA sign. CT Head 11/3 (post IR) >>> suggestion of minimal hypodensity within the left basal ganglia region possibly representing age indeterminate ischemic change; left MCA gray white differentiation Echo 11/4 >>> CT head 11/4 afternoon >>  SIGNIFICANT EVENTS: 11/3 - underwent revascularization by IR, admitted to neuro ICU   SUBJECTIVE: Bladder scan shows 750cc urine in bladder, no foley, some hematuria, otherwise no acute events  VITAL SIGNS: Temp:  [97.5 F (36.4 C)-97.9 F (36.6 C)] 97.6 F (36.4 C) (11/03 2350) Pulse Rate:  [52-117] 69 (11/04 0333) Resp:  [14-21] 19 (11/04 0333) BP: (85-155)/(55-90) 145/73 mmHg (11/04 0333) SpO2:  [93 %-100 %] 97 % (11/04 0333) Arterial Line BP: (97-140)/(54-75) 120/60 mmHg (11/04 0230) FiO2 (%):  [40 %-50 %] 40 % (11/04 0333) Weight:  [107 kg (235 lb 14.3 oz)-109.3 kg (240 lb 15.4 oz)] 109.3 kg (240 lb 15.4 oz) (11/03 2000) HEMODYNAMICS:   VENTILATOR SETTINGS: Vent Mode:  [-] PRVC FiO2 (%):  [40 %-50 %] 40 % Set Rate:  [14 bmp] 14 bmp Vt Set:  [500 mL-550 mL] 500 mL PEEP:  [5 cmH20] 5 cmH20 Plateau Pressure:  [20 cmH20-25 cmH20] 23 cmH20 INTAKE / OUTPUT: Intake/Output      11/03 0701 - 11/04 0700   I.V. (mL/kg) 1227.6 (11.2)   Total Intake(mL/kg) 1227.6 (11.2)   Blood 100   Total Output 100   Net +1127.6         PHYSICAL EXAMINATION: General: sedated on vent, moves L arm and leg purposefully HEENT NCAT, ETT PULM: CTA B CV: Irreg irreg AB: BS+ soft,  nontender Ext: warm Neuro: moves L arm and leg spontaneously, moves R foot to pain (twitch)  LABS:  CBC  Recent Labs Lab 05/18/14 1504 05/18/14 1516  WBC 6.2  --   HGB 14.8 15.6  HCT 43.7 46.0  PLT 167  --    Coag's  Recent Labs Lab 05/18/14 1504  APTT 30  INR 1.11   BMET  Recent Labs Lab 05/18/14 1504 05/18/14 1516  NA 139 138  K 4.3 3.9  CL 101 102  CO2 24  --   BUN 18 18  CREATININE 0.87 1.00  GLUCOSE 115* 117*   Electrolytes  Recent Labs Lab 05/18/14 1504  CALCIUM 9.4   Sepsis Markers No results for input(s): LATICACIDVEN, PROCALCITON, O2SATVEN in the last 168 hours. ABG  Recent Labs Lab 05/18/14 2011  PHART 7.335*  PCO2ART 43.3  PO2ART 90.0   Liver Enzymes  Recent Labs Lab 05/18/14 1504  AST 38*  ALT 41  ALKPHOS 44  BILITOT 0.5  ALBUMIN 3.9   Cardiac Enzymes No results for input(s): TROPONINI, PROBNP in the last 168 hours. Glucose  Recent Labs Lab 05/18/14 1511 05/18/14 2032 05/18/14 2348 05/19/14 0347  GLUCAP 107* 143* 132* 122*    Imaging Ct Head Wo Contrast  05/18/2014   CLINICAL DATA:  Followup evaluation status post interventional procedure  EXAM: CT HEAD WITHOUT CONTRAST  TECHNIQUE: Contiguous axial images were obtained from the base of the skull through the vertex without intravenous contrast.  COMPARISON:  CT brain 06/07/2014  FINDINGS: Ventricles and sulci are prominent, compatible with atrophy. No definite evidence for acute intracranial hemorrhage, mass lesion or significant mass effect. Increased density along the falx and tentorium favored to represent sequelae of contrast administration. Minimal hypodensity within the left basal ganglia region however relative preservation of the gray-white differentiation within the left MCA territory. Orbits are unremarkable. Ethmoid sinus mucosal thickening. Mastoid air cells are unremarkable. Calvarium is intact.  IMPRESSION: Suggestion of minimal hypodensity within the left  basal ganglia region possibly representing age indeterminate ischemic change. Overall however, there is relative preservation of the gray-white differentiation within the left MCA territory without suggestion of a large infarct visible at this time.  Increased density along the falx and tentorium is favored to represent sequelae of contrast administration.   Electronically Signed   By: Lovey Newcomer M.D.   On: 05/18/2014 19:37   Ct Head Wo Contrast  05/18/2014   CLINICAL DATA:  Code stroke.  Right-sided weakness.  Blurred vision  EXAM: CT HEAD WITHOUT CONTRAST  TECHNIQUE: Contiguous axial images were obtained from the base of the skull through the vertex without intravenous contrast.  COMPARISON:  CT head 09/04/2013  FINDINGS: Generalized atrophy. Mild chronic microvascular ischemic change in the periventricular white matter.  Negative for acute infarct. Negative for hemorrhage or mass. Calvarium intact.  Increased density in the left terminal internal carotid artery and left MCA most compatible with thrombosis. No evidence of acute infarct.  IMPRESSION: Atrophy and minimal chronic microvascular ischemia. No acute abnormality.  Dense left MCA compatible with thrombosis.  Critical Value/emergent results were called by telephone at the time of interpretation on 05/18/2014 at 3:34 pm to Dr. Wallie Warren , who verbally acknowledged these results.   Electronically Signed   By: Franchot Gallo M.D.   On: 05/18/2014 15:36     ASSESSMENT / PLAN:  NEUROLOGIC A:   Acute CVA - s/p complete revascularization of total occlusion of left ICA / MCA / ACA Acute encephalopathy - due to above + sedation P:   Additional management per neuro. Sedation:  Propofol gtt / Fentanyl PRN. RASS goal: 0 to -1. CXR this AM  PULMONARY OETT 11/3 >>> A: Acute respiratory failure - in setting of acute CVA P:   Full mechanical support, wean as able. VAP bundle. SBT when OK by neurology (will re-assess after 11/4 CT  head)  CARDIOVASCULAR A:  New onset A.Fib, rate controlled Hx HTN, HLD, Vtach P:  Hold b-blocker considering normal HR No anticoagulation for now (pt received tPA). BP goals per neuro. Hold outpatient aspirin, carvedilol, fenofibrate, torsemide, verapamil.  RENAL A:   No acute issues P:   NS @ 75. BMP in AM.  GASTROINTESTINAL A:   GI prophylaxis Nutrition P:   SUP: Pantoprazole. NPO. SLP eval in AM if extubated. TF if remains extubated after CT head today  HEMATOLOGIC A:   VTE Prophylaxis P:  SCD's only. CBC in AM.  INFECTIOUS A:   No evidence of infection P:   Monitor clinically.  ENDOCRINE A:   DM P:   CBG's q4hr. SSI. Hold outpatient metformin.  Family updated: At bedside on 11/4  Interdisciplinary Family Meeting v Palliative Care Meeting:  Due by: 11/10.  CC time 35 minutes  Roselie Awkward, MD [place] [facility] Pager: [phone] Cell: [phone] If no response, call [phone]

## 2014-05-19 NOTE — Progress Notes (Signed)
OT Cancellation Note  Patient Details Name: Austin Warren MRN: [id number] DOB: 1941/02/18   Cancelled Treatment:    Reason Eval/Treat Not Completed: Patient not medically ready (Remains on bedrest.  Will continue to follow.)  Malka So 05/19/2014, 1:20 PM

## 2014-05-19 NOTE — Progress Notes (Signed)
PT Cancellation Note  Patient Details Name: Austin Warren MRN: [id number] DOB: [birth year]   Cancelled Treatment:    Reason Eval/Treat Not Completed: Patient not medically ready.  Pt on bedrest until 1648.  Please advance activity order if pt will be appropriate for mobility tomorrow.  Will f/u tomorrow.     Catalia Massett, Thornton Papas 05/19/2014, 11:46 AM

## 2014-05-19 NOTE — Progress Notes (Signed)
[place] Progress Note Patient Name: Austin Warren DOB: Nov 29, 1940 MRN: [id number]   Date of Service  05/19/2014  HPI/Events of Note  Bladder scan > 750 cc urine It has been approx 12 hrs since received tPA  eICU Interventions  Foley catheter ordered     Intervention Category Major Interventions: Other:  Merton Border 05/19/2014, 3:29 AM

## 2014-05-19 NOTE — Progress Notes (Signed)
62fr Coude cath inserted at 530am. Patient stable, tolerated output noted.

## 2014-05-19 NOTE — Progress Notes (Signed)
UR completed.  Drequan Ironside, RN BSN [facility] CCM Trauma/Neuro ICU Case Manager [phone]  

## 2014-05-19 NOTE — Progress Notes (Signed)
32 beat run of VTach. Patient asymptomatic with history of VTach. Dr. Lake Bells notified. No new orders. Cardiology to be consulted. Will continue to monitor.

## 2014-05-20 ENCOUNTER — Inpatient Hospital Stay (HOSPITAL_COMMUNITY): Payer: Medicare Other

## 2014-05-20 DIAGNOSIS — I5031 Acute diastolic (congestive) heart failure: Secondary | ICD-10-CM

## 2014-05-20 DIAGNOSIS — I6350 Cerebral infarction due to unspecified occlusion or stenosis of unspecified cerebral artery: Secondary | ICD-10-CM

## 2014-05-20 LAB — CBC WITH DIFFERENTIAL/PLATELET
BASOS PCT: 0 % (ref 0–1)
Basophils Absolute: 0 10*3/uL (ref 0.0–0.1)
Eosinophils Absolute: 0 10*3/uL (ref 0.0–0.7)
Eosinophils Relative: 0 % (ref 0–5)
HEMATOCRIT: 43.3 % (ref 39.0–52.0)
HEMOGLOBIN: 14.3 g/dL (ref 13.0–17.0)
LYMPHS ABS: 0.8 10*3/uL (ref 0.7–4.0)
Lymphocytes Relative: 9 % — ABNORMAL LOW (ref 12–46)
MCH: 32.7 pg (ref 26.0–34.0)
MCHC: 33 g/dL (ref 30.0–36.0)
MCV: 99.1 fL (ref 78.0–100.0)
MONOS PCT: 10 % (ref 3–12)
Monocytes Absolute: 1 10*3/uL (ref 0.1–1.0)
NEUTROS PCT: 81 % — AB (ref 43–77)
Neutro Abs: 7.6 10*3/uL (ref 1.7–7.7)
Platelets: 144 10*3/uL — ABNORMAL LOW (ref 150–400)
RBC: 4.37 MIL/uL (ref 4.22–5.81)
RDW: 13.1 % (ref 11.5–15.5)
WBC: 9.4 10*3/uL (ref 4.0–10.5)

## 2014-05-20 LAB — BASIC METABOLIC PANEL
Anion gap: 13 (ref 5–15)
BUN: 8 mg/dL (ref 6–23)
CHLORIDE: 105 meq/L (ref 96–112)
CO2: 23 meq/L (ref 19–32)
Calcium: 8.2 mg/dL — ABNORMAL LOW (ref 8.4–10.5)
Creatinine, Ser: 0.64 mg/dL (ref 0.50–1.35)
GFR calc non Af Amer: >90 mL/min (ref >90)
GLUCOSE: 135 mg/dL — AB (ref 70–99)
POTASSIUM: 3.6 meq/L — AB (ref 3.7–5.3)
Sodium: 141 mEq/L (ref 137–147)

## 2014-05-20 LAB — GLUCOSE, CAPILLARY
GLUCOSE-CAPILLARY: 117 mg/dL — AB (ref 70–99)
GLUCOSE-CAPILLARY: 119 mg/dL — AB (ref 70–99)
GLUCOSE-CAPILLARY: 139 mg/dL — AB (ref 70–99)
GLUCOSE-CAPILLARY: 140 mg/dL — AB (ref 70–99)
Glucose-Capillary: 112 mg/dL — ABNORMAL HIGH (ref 70–99)
Glucose-Capillary: 123 mg/dL — ABNORMAL HIGH (ref 70–99)

## 2014-05-20 LAB — MAGNESIUM: Magnesium: 1.8 mg/dL (ref 1.5–2.5)

## 2014-05-20 LAB — PHOSPHORUS: Phosphorus: 2.7 mg/dL (ref 2.3–4.6)

## 2014-05-20 MED ORDER — LEVALBUTEROL HCL 0.63 MG/3ML IN NEBU
0.6300 mg | INHALATION_SOLUTION | Freq: Four times a day (QID) | RESPIRATORY_TRACT | Status: DC | PRN
  Administered 2014-05-20: 0.63 mg via RESPIRATORY_TRACT

## 2014-05-20 MED ORDER — TAMSULOSIN HCL 0.4 MG PO CAPS
0.4000 mg | ORAL_CAPSULE | Freq: Every day | ORAL | Status: DC
  Administered 2014-05-21 – 2014-05-25 (×5): 0.4 mg via ORAL
  Filled 2014-05-20 (×5): qty 1

## 2014-05-20 MED ORDER — INSULIN ASPART 100 UNIT/ML ~~LOC~~ SOLN
0.0000 [IU] | Freq: Three times a day (TID) | SUBCUTANEOUS | Status: DC
  Administered 2014-05-21: 2 [IU] via SUBCUTANEOUS
  Administered 2014-05-21 (×2): 3 [IU] via SUBCUTANEOUS
  Administered 2014-05-22 – 2014-05-25 (×5): 2 [IU] via SUBCUTANEOUS

## 2014-05-20 MED ORDER — FUROSEMIDE 10 MG/ML IJ SOLN
40.0000 mg | Freq: Once | INTRAMUSCULAR | Status: AC
  Administered 2014-05-20: 40 mg via INTRAVENOUS
  Filled 2014-05-20: qty 4

## 2014-05-20 MED ORDER — ATORVASTATIN CALCIUM 10 MG PO TABS
20.0000 mg | ORAL_TABLET | Freq: Every day | ORAL | Status: DC
  Administered 2014-05-21 – 2014-05-24 (×4): 20 mg via ORAL
  Filled 2014-05-20 (×2): qty 2
  Filled 2014-05-20: qty 1
  Filled 2014-05-20 (×2): qty 2

## 2014-05-20 MED ORDER — ASPIRIN EC 325 MG PO TBEC
325.0000 mg | DELAYED_RELEASE_TABLET | Freq: Every day | ORAL | Status: DC
  Administered 2014-05-21 – 2014-05-23 (×3): 325 mg via ORAL
  Filled 2014-05-20 (×3): qty 1

## 2014-05-20 MED ORDER — ADULT MULTIVITAMIN W/MINERALS CH
1.0000 | ORAL_TABLET | Freq: Every day | ORAL | Status: DC
  Administered 2014-05-21 – 2014-05-25 (×5): 1 via ORAL
  Filled 2014-05-20 (×5): qty 1

## 2014-05-20 MED ORDER — PANTOPRAZOLE SODIUM 40 MG PO TBEC
40.0000 mg | DELAYED_RELEASE_TABLET | Freq: Every day | ORAL | Status: DC
  Administered 2014-05-21 – 2014-05-25 (×5): 40 mg via ORAL
  Filled 2014-05-20 (×5): qty 1

## 2014-05-20 MED ORDER — LEVALBUTEROL HCL 0.63 MG/3ML IN NEBU
INHALATION_SOLUTION | RESPIRATORY_TRACT | Status: AC
  Filled 2014-05-20: qty 3

## 2014-05-20 MED ORDER — FINASTERIDE 5 MG PO TABS
5.0000 mg | ORAL_TABLET | Freq: Every day | ORAL | Status: DC
  Administered 2014-05-21 – 2014-05-25 (×5): 5 mg via ORAL
  Filled 2014-05-20 (×5): qty 1

## 2014-05-20 MED ORDER — METFORMIN HCL 500 MG PO TABS
500.0000 mg | ORAL_TABLET | Freq: Two times a day (BID) | ORAL | Status: DC
  Administered 2014-05-21 – 2014-05-25 (×9): 500 mg via ORAL
  Filled 2014-05-20 (×11): qty 1

## 2014-05-20 MED ORDER — CARVEDILOL 6.25 MG PO TABS
6.2500 mg | ORAL_TABLET | Freq: Two times a day (BID) | ORAL | Status: DC
  Administered 2014-05-21 – 2014-05-25 (×10): 6.25 mg via ORAL
  Filled 2014-05-20 (×12): qty 1

## 2014-05-20 MED ORDER — TORSEMIDE 20 MG PO TABS
10.0000 mg | ORAL_TABLET | Freq: Every day | ORAL | Status: DC
  Administered 2014-05-21 – 2014-05-22 (×2): 10 mg via ORAL
  Filled 2014-05-20: qty 0.5
  Filled 2014-05-20: qty 1
  Filled 2014-05-20: qty 0.5

## 2014-05-20 NOTE — Progress Notes (Signed)
Patient Name: Austin Warren      SUBJECTIVE:* SOB and CXR consistent with CHF and this correlates with his antecedent symptoms; receioed lasix last night  More alert;  With aphasia variable answers as to SOB   Hx of verapamil sensitive VT but also with MRI with DE  Past Medical History  Diagnosis Date  . Diabetes mellitus   . Hypertension   . Hyperlipidemia   . History of colon polyps   . Benign prostatic hypertrophy   . Arthritis   . Ventricular tachycardia     RBB/LAHB ideopathic VT, 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  . Melanoma     under arm  . Osteoarthritis   . Dyspnea on exertion 11/01/2011    Scheduled Meds:  Scheduled Meds: .  stroke: mapping our early stages of recovery book   Does not apply Once  . antiseptic oral rinse  7 mL Mouth Rinse QID  . aspirin  300 mg Rectal Daily  . chlorhexidine  15 mL Mouth Rinse BID  . insulin aspart  0-15 Units Subcutaneous 6 times per day  . pantoprazole (PROTONIX) IV  40 mg Intravenous Daily   Continuous Infusions: . sodium chloride 10 mL/hr at 05/20/14 0131  . sodium chloride    . niCARDipine Stopped (05/19/14 1100)   acetaminophen **OR** acetaminophen, labetalol, levalbuterol, ondansetron (ZOFRAN) IV, senna-docusate    PHYSICAL EXAM Filed Vitals:   05/20/14 0346 05/20/14 0400 05/20/14 0500 05/20/14 0600  BP:  123/92 136/80 134/75  Pulse:  56 97 92  Temp: 98.4 F (36.9 C)     TempSrc: Oral     Resp:  26 19 25   Height:      Weight:      SpO2:  94% 95% 94%  Well developed and nourished in nio obvious resp diistress HENT normal Neck supple   unale to discern JVP Clear Irregularly irregular rate and rhythm with controled  ventricular response, no murmurs or gallops Abd-soft with active BS without hepatomegaly No Clubbing cyanosis edema Skin-warm and dry A & Oriented  Grossly normal sensory and motor function  TELEMETRY: Reviewed telemetry pt in afib:    Intake/Output Summary (Last 24 hours) at  05/20/14 0653 Last data filed at 05/20/14 0600  Gross per 24 hour  Intake   1308 ml  Output   1755 ml  Net   -447 ml    LABS: Basic Metabolic Panel:  Recent Labs Lab 05/18/14 1504 05/18/14 1516 05/19/14 0510 05/19/14 1100 05/20/14 0235 05/20/14 0239  NA 139 138 137  --  141  --   K 4.3 3.9 3.8  --  3.6*  --   CL 101 102 102  --  105  --   CO2 24  --  23  --  23  --   GLUCOSE 115* 117* 146*  --  135*  --   BUN 18 18 11   --  8  --   CREATININE 0.87 1.00 0.60  --  0.64  --   CALCIUM 9.4  --  7.9*  --  8.2*  --   MG  --   --   --  1.8  --  1.8  PHOS  --   --   --   --   --  2.7   Cardiac Enzymes: No results for input(s): CKTOTAL, CKMB, CKMBINDEX, TROPONINI in the last 72 hours. CBC:  Recent Labs Lab 05/18/14 1504 05/18/14 1516 05/19/14 0510 05/20/14 0235  WBC  6.2  --  8.6 9.4  NEUTROABS 4.2  --  7.1 7.6  HGB 14.8 15.6 13.4 14.3  HCT 43.7 46.0 39.9 43.3  MCV 96.0  --  95.7 99.1  PLT 167  --  135* 144*   PROTIME:  Recent Labs  05/18/14 1504  LABPROT 14.4  INR 1.11   Liver Function Tests:  Recent Labs  05/18/14 1504  AST 38*  ALT 41  ALKPHOS 44  BILITOT 0.5  PROT 6.9  ALBUMIN 3.9   No results for input(s): LIPASE, AMYLASE in the last 72 hours. BNP: BNP (last 3 results) No results for input(s): PROBNP in the last 8760 hours. D-Dimer: No results for input(s): DDIMER in the last 72 hours. Hemoglobin A1C:  Recent Labs  05/19/14 0510  HGBA1C 6.3*   Fasting Lipid Panel:  Recent Labs  05/19/14 0510  CHOL 124  HDL 38*  LDLCALC 58  TRIG 142  CHOLHDL 3.3   T    ASSESSMENT AND PLAN:  Active Problems:   Atrial fibrillation, new onset   CVA (cerebral vascular accident)   Essential hypertension   Diabetes mellitus   Stroke   Acute diastolic CHF (congestive heart failure)  Previously described VT (in house)  Aberration Newly identified mild cardiomypathy withsuggestion of wallmotion abnormality Defer testing for now Would add ACE if  BP allows     anticoag per neuro Verapamil when able to take PO for VT Lasix IV x one more dose    Signed, Virl Axe MD  05/20/2014

## 2014-05-20 NOTE — Evaluation (Addendum)
Clinical/Bedside Swallow Evaluation Patient Details  Name: Austin Warren MRN: [id number] Date of Birth: 1941-02-12  Today's Date: 05/20/2014 Time: 0626-9485 SLP Time Calculation (min): 42 min  Past Medical History:  Past Medical History  Diagnosis Date  . Diabetes mellitus   . Hypertension   . Hyperlipidemia   . History of colon polyps   . Benign prostatic hypertrophy   . Arthritis   . Ventricular tachycardia     RBB/LAHB ideopathic VT, 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  . Melanoma     under arm  . Osteoarthritis   . Dyspnea on exertion 11/01/2011   Past Surgical History:  Past Surgical History  Procedure Laterality Date  . Replacement total knee    . Vasectomy    . Joint replacement      R TKR  . Replacement total knee Left   . Radiology with anesthesia N/A 05/18/2014    Procedure: RADIOLOGY WITH ANESTHESIA;  Surgeon: Rob Hickman, MD;  Location: [facility];  Service: Radiology;  Laterality: N/A;   HPI:  Austin Warren is an 73 y.o. male who arrived via EMS as a code stroke with left gaze preference, right facial droop, right sided plegia. Patient was administered IV TPA followed by cerebral angio in IR where he was found to have a T occlusion with complete revascularization of Lt ICA terminus, Lt MCA and Lt ACA with 12mg  of IA TPA. MRI shows areas of acute infarction remain following revascularization of the LEFT ICA, ACA, and MCA, involving the medial LEFT temporal lobe uncus and LEFT basal ganglia. CXR shows cardiac enlargement with increasing pulmonary vascular congestion   Assessment / Plan / Recommendation Clinical Impression  Pt is at an increased risk of silent aspiration due to recent CVA and intubation. His swallow appears to be WNL with ice chips and puree, but signs of aspiration were evident with thin (delayed cough). Due to risk of decreased airway protection, concern for neuromuscular impairment, and impaired respiratory function, recommend FEES to objectively  evaluate swallowing function and make appropriate diet recommendation.    Aspiration Risk  Moderate    Diet Recommendation NPO        Other  Recommendations Oral Care Recommendations: Oral care Q4 per protocol   Follow Up Recommendations  Inpatient Rehab    Frequency and Duration min 2x/week      Pertinent Vitals/Pain none    SLP Swallow Goals     Swallow Study Prior Functional Status  Cognitive/Linguistic Baseline: Within functional limits Type of Home: House  Lives With: Spouse Available Help at Discharge: Family    General Date of Onset: 05/18/14 HPI: Austin Warren is an 73 y.o. male who arrived via EMS as a code stroke with left gaze preference, right facial droop, right sided plegia. Patient was administered IV TPA followed by cerebral angio in IR where he was found to have a T occlusion with complete revascularization of Lt ICA terminus, Lt MCA and Lt ACA with 12mg  of IA TPA. MRI shows areas of acute infarction remain following revascularization of the LEFT ICA, ACA, and MCA, involving the medial LEFT temporal lobe uncus and LEFT basal ganglia. CXR shows cardiac enlargement with increasing pulmonary vascular congestion Type of Study: Bedside swallow evaluation Diet Prior to this Study: NPO Temperature Spikes Noted: No Respiratory Status: Nasal cannula History of Recent Intubation: Yes Length of Intubations (days): 1 days Date extubated: 05/19/14 Behavior/Cognition: Alert;Cooperative;Pleasant mood Oral Cavity - Dentition: Adequate natural dentition Self-Feeding Abilities: Able to feed self  Patient Positioning: Upright in bed Baseline Vocal Quality: Wet;Hoarse Volitional Cough: Weak;Congested Volitional Swallow: Able to elicit    Oral/Motor/Sensory Function Overall Oral Motor/Sensory Function: Impaired (difficulty assessing due to aphasia and apraxia)   Ice Chips Ice chips: Within functional limits Presentation: Spoon;Self Fed   Thin Liquid Thin Liquid:  Impaired Presentation: Cup;Straw;Self Fed Pharyngeal  Phase Impairments: Suspected delayed Swallow;Cough - Delayed    Nectar Thick     Honey Thick     Puree Puree: Within functional limits Presentation: Spoon   Solid   GO            [name] 05/20/2014,10:34 AM

## 2014-05-20 NOTE — Progress Notes (Signed)
[place] Progress Note Patient Name: Austin Warren DOB: 12/12/1940 MRN: [id number]   Date of Service  05/20/2014  HPI/Events of Note  Rn concerned as pt complained of mild SOB, remains rate controlled, am pcxr edema,,small voolumes,. Linear rul infiltrate / atx likely mores o so   eICU Interventions  Camera eval: no distress, speaking well rn reports wheezing  xop x 1, kvo, pcxr May need lasix     Intervention Category Intermediate Interventions: Respiratory distress - evaluation and management  FEINSTEIN,DANIEL J. 05/20/2014, 1:33 AM

## 2014-05-20 NOTE — Progress Notes (Signed)
STROKE TEAM PROGRESS NOTE   HISTORY Austin Warren is an 73 y.o. male who was normal this AM 05/18/2014. He went to the bank at 12:30 and noted he had blurred vision. When he came home he mentioned this to his wife and she called his PCP. They were told to take his BP. As they were taking his BP he noted he could not get up from the chair and was unable to move his right side. EMS was called and patient was brought to ED as a code stroke. On arrival he showed a left gaze gaze preference, right facial droop, right sided plegia. Initial CT head showed no acute infarct but did demonstrate a large dense left MCA sign. IR was contacted. IV tPA was started and patient was brought back to IR for cerebral angiogram. NIH stroke score was 23. EKG showed atrial fibrillation, which had not previously been known. Patient was administered IV TPA followed by cerebral angio in IR where he was found to have a T occlusion with complete revascularization of Lt ICA terminus, Lt MCA and Lt ACA with 12mg  of IA TPA, 1 pass with Trevoprovue, and 1 pass with Solitaire withTICI 3 revascularization. He was admitted to the neuro ICU for further evaluation and treatment.   SUBJECTIVE (INTERVAL HISTORY) His wife and daughter are at the bedside.  Overall he feels his condition is gradually improving.  BP adequately controlled overnight and extubated last night. MRI shows nonhemorrhagic left uncus,basal ganglia and periventricular patchy infarcts.   OBJECTIVE Temp:  [97.7 F (36.5 C)-98.4 F (36.9 C)] 97.9 F (36.6 C) (11/05 0738) Pulse Rate:  [44-103] 71 (11/05 0900) Cardiac Rhythm:  [-] Atrial fibrillation (11/05 0800) Resp:  [15-37] 29 (11/05 0900) BP: (110-156)/(61-103) 145/85 mmHg (11/05 0900) SpO2:  [91 %-100 %] 91 % (11/05 0900) Arterial Line BP: (113-119)/(54-63) 119/63 mmHg (11/04 1030) FiO2 (%):  [40 %] 40 % (11/04 1630)   Recent Labs Lab 05/19/14 1611 05/19/14 1919 05/20/14 0039 05/20/14 0344  05/20/14 0736  GLUCAP 120* 121* 117* 119* 140*    Recent Labs Lab 05/18/14 1504 05/18/14 1516 05/19/14 0510 05/19/14 1100 05/20/14 0235 05/20/14 0239  NA 139 138 137  --  141  --   K 4.3 3.9 3.8  --  3.6*  --   CL 101 102 102  --  105  --   CO2 24  --  23  --  23  --   GLUCOSE 115* 117* 146*  --  135*  --   BUN 18 18 11   --  8  --   CREATININE 0.87 1.00 0.60  --  0.64  --   CALCIUM 9.4  --  7.9*  --  8.2*  --   MG  --   --   --  1.8  --  1.8  PHOS  --   --   --   --   --  2.7    Recent Labs Lab 05/18/14 1504  AST 38*  ALT 41  ALKPHOS 44  BILITOT 0.5  PROT 6.9  ALBUMIN 3.9    Recent Labs Lab 05/18/14 1504 05/18/14 1516 05/19/14 0510 05/20/14 0235  WBC 6.2  --  8.6 9.4  NEUTROABS 4.2  --  7.1 7.6  HGB 14.8 15.6 13.4 14.3  HCT 43.7 46.0 39.9 43.3  MCV 96.0  --  95.7 99.1  PLT 167  --  135* 144*   No results for input(s): CKTOTAL, CKMB, CKMBINDEX, TROPONINI in the  last 168 hours.  Recent Labs  05/18/14 1504  LABPROT 14.4  INR 1.11    Recent Labs  05/19/14 0605  COLORURINE YELLOW  LABSPEC 1.027  PHURINE 6.0  GLUCOSEU NEGATIVE  HGBUR SMALL*  BILIRUBINUR NEGATIVE  KETONESUR NEGATIVE  PROTEINUR NEGATIVE  UROBILINOGEN 0.2  NITRITE NEGATIVE  LEUKOCYTESUR NEGATIVE       Component Value Date/Time   CHOL 124 05/19/2014 0510   TRIG 142 05/19/2014 0510   HDL 38* 05/19/2014 0510   CHOLHDL 3.3 05/19/2014 0510   VLDL 28 05/19/2014 0510   LDLCALC 58 05/19/2014 0510   Lab Results  Component Value Date   HGBA1C 6.3* 05/19/2014      Component Value Date/Time   LABOPIA NONE DETECTED 05/19/2014 0605   COCAINSCRNUR NONE DETECTED 05/19/2014 0605   LABBENZ NONE DETECTED 05/19/2014 0605   AMPHETMU NONE DETECTED 05/19/2014 0605   THCU NONE DETECTED 05/19/2014 0605   LABBARB NONE DETECTED 05/19/2014 0605     Recent Labs Lab 05/18/14 1504  ETH <11    Ct Head Wo Contrast 05/18/2014 Suggestion of minimal hypodensity within the left basal ganglia  region possibly representing age indeterminate ischemic change. Overall however, there is relative preservation of the gray-white differentiation within the left MCA territory without suggestion of a large infarct visible at this time. Increased density along the falx and tentorium is favored to represent sequelae of contrast administration.  05/18/2014 Atrophy and minimal chronic microvascular ischemia. No acute abnormality. Dense left MCA compatible with thrombosis.   Cerebral Angio  05/18/2014 S/P bilateral common carotid arteriograms , followed by complete revascularization of T occlusion of Lt ICA terminus ,Lt MCA and Lt ACA with 12mg  of superselective IA TPA , and xi pass with Trevoprovue 4 x 30 mm ,and x 1 pass with Solitaire FR 4 mm x 40 mm with TICI 3 revascularization   PHYSICAL EXAM Elderly Caucasian male    Afebrile. Head is nontraumatic. Neck is supple without bruit.  . Cardiac exam no murmur or gallop. Lungs are clear to auscultation. Distal pulses are well felt. Bilateral groin arterial sheaths present. Neurological Exam ; Awake alert mild expressive aphasia with nonfluent speech with naming ,comprehension and repitition difficulties. Follows 2 step commands. Extraocular movements are  full range. Blinks to threat on both sides. Mild right lower facial weakness. Tongue midline.Mild right hemiparesis 3/5. With mild drift. Good left sided strengthc. Decreased sensation on the right hemibody. Right plantar upgoing left downgoing. Coordination and gait cannot be tested. NIHSS 8 ASSESSMENT/PLAN Austin Warren is a 73 y.o. male presenting with left gaze preference, right facial droop, right sided plegia. He did receive IV t-PA 05/18/2014 at 1536. He was then taken to IR for a bilateral cerebral angio where he was found to have a T occlusion with complete revascularization of Lt ICA terminus, Lt MCA and Lt ACA with 12mg  of IA TPA, 1 pass with Trevoprovue, and 1 pass with  Solitaire with resultantTICI 3 revascularization.  Stroke: Dominant left MCA infarct s/p IV tPA, IA tPA and mechanical revasculaization of T occlusion. Infarct embolic secondary to newly diagnosed atrial fibrillation  S/P bilateral common carotid arteriograms , followed by  Resultant VDRF, left gaze preference, right hemiparesis MRI  Areas of acute infarction remain following revascularization of the LEFT ICA, ACA, and MCA, involving the medial LEFT temporal lobe  uncus and LEFT basal ganglia. No hemorrhagic transformation  Canceled MRA and Carotid Doppler given cerebral angio  2D Echo Left ventricle: The cavity size was  mildly dilated. Wall thickness was increased in a pattern of moderate LVH. There was mild concentric hypertrophy. Systolic function was mildly reduced. The estimated ejection fraction was in the range of 45% to 50%. There is hypokinesis of the inferior and apical myocardium.  SCDs for VTE prophylaxis  Diet NPO time specified   aspirin 81 mg orally every day prior to admission, now on no antithrombotics as within 24h of tPA  Ongoing aggressive risk factor management  Therapy recommendations:  pending   Disposition:  pending   Hypertension  Home meds:   Verapamil, coreg  Stable  Goal per Dr. Estanislado Pandy post intervention  Hyperlipidemia  Home meds:  Tricor, order resumed in hospital, however pt unable to swallow at this time  LDL 58, at goal < 70  Continue statin at discharge  Diabetes  Home meds:  Glucophage  HgbA1c pending  goal < 7.0  Other Stroke Risk Factors Advanced age ETOH use . Obesity, Body mass index is 36.65 kg/(m^2).  Marland Kitchen Family hx stroke (father)  Other Active Problems  BPH - on mult home meds. Has foley now.  Ventricular tachycardia  Other Pertinent History  Hx melanoma  Hospital day # 2  Plan Mobilize out of bed. PT/OT/ST consults Anticoagulation with NOAC in a few days when able to swallow. I had a long  discussion with patient and his wife and daughter regarding his stroke, examination findings, personally reviewed and discussed imaging studies, plan for further evaluation, treatment and answered questions. This patient is critically ill and at significant risk of neurological worsening, death and care requires constant monitoring of vital signs, hemodynamics,respiratory and cardiac monitoring,review of multiple databases, neurological assessment, discussion with family, other specialists and medical decision making of high complexity.I have made any additions or clarifications directly to the above note.  I spent 30 minutes of neurocritical care time  in the care of  this patient. Antony Contras, MD   To contact Stroke Continuity provider, please refer to [email or web address] After hours, contact General Neurology

## 2014-05-20 NOTE — Evaluation (Signed)
Physical Therapy Evaluation Patient Details Name: LEMARCUS BAGGERLY MRN: [id number] DOB: September 14, 1940 Today's Date: 05/20/2014   History of Present Illness  73 y.o. [name] brought to [facility] ED on 11/3 as code stroke.  In ED, found to have dense left MCA sign.  tPA administered and pt taken to IR for revascularization.  Pt returned to ICU on vent, extubated 11/4.  Clinical Impression  Patient demonstrates deficits in functional mobility as indicated below. Will need continued skilled PT to address deficits and maximize function. Will see as indicated and progress as tolerated. Recommend CIR consult. At this time patient with increased expressive difficulties, poor historian.  VS assessed throughout session, BP 735H systolic.    Follow Up Recommendations CIR;Supervision/Assistance - 24 hour    Equipment Recommendations  Other (comment) (TBD)    Recommendations for Other Services Rehab consult     Precautions / Restrictions Precautions Precautions: Fall Precaution Comments: expressive difficulties Restrictions Weight Bearing Restrictions: No      Mobility  Bed Mobility Overal bed mobility: Needs Assistance Bed Mobility: Supine to Sit     Supine to sit: Mod assist        Transfers Overall transfer level: Needs assistance Equipment used: Rolling walker (2 wheeled) Transfers: Sit to/from [facility] Sit to Stand: Min assist;+2 physical assistance Stand pivot transfers: Min assist;+2 physical assistance          Ambulation/Gait Ambulation/Gait assistance: Min assist;+2 physical assistance Ambulation Distance (Feet): 8 Feet         General Gait Details: pivotal steps to chair  Stairs            Wheelchair Mobility    Modified Rankin (Stroke Patients Only)       Balance Overall balance assessment: Needs assistance Sitting-balance support: Feet supported Sitting balance-Leahy Scale: Fair Sitting balance - Comments: difficulty accepting  challenge     Standing balance-Leahy Scale: Poor Standing balance comment: assist for stability                             Pertinent Vitals/Pain Pain Assessment: No/denies pain    Home Living Family/patient expects to be discharged to:: Inpatient rehab   Available Help at Discharge: Family Type of Home: House                Prior Function                 Hand Dominance   Dominant Hand: Right    Extremity/Trunk Assessment   Upper Extremity Assessment: Defer to OT evaluation             RLE Deficits / Details: slight weakness noted during functional mobility        Communication   Communication: Expressive difficulties  Cognition Arousal/Alertness: Awake/alert Behavior During Therapy: WFL for tasks assessed/performed Overall Cognitive Status: Impaired/Different from baseline Area of Impairment: Orientation;Memory;Safety/judgement;Awareness;Problem solving Orientation Level: Disoriented to;Place;Time;Situation   Memory: Decreased short-term memory   Safety/Judgement: Decreased awareness of safety Awareness: Intellectual Problem Solving: Slow processing;Decreased initiation;Difficulty sequencing;Requires verbal cues;Requires tactile cues      General Comments      Exercises        Assessment/Plan    PT Assessment Patient needs continued PT services  PT Diagnosis Difficulty walking;Abnormality of gait;Altered mental status   PT Problem List Decreased strength;Decreased activity tolerance;Decreased balance;Decreased mobility;Decreased coordination;Decreased cognition;Decreased safety awareness  PT Treatment Interventions DME instruction;Gait training;Functional mobility training;Therapeutic activities;Therapeutic exercise;Balance training;Patient/family education  PT Goals (Current goals can be found in the Care Plan section) Acute Rehab PT Goals Patient Stated Goal: to get up PT Goal Formulation: With patient Time For Goal  Achievement: 06/03/14 Potential to Achieve Goals: Good    Frequency Min 3X/week   Barriers to discharge        Co-evaluation               End of Session Equipment Utilized During Treatment: Gait belt Activity Tolerance: Patient tolerated treatment well Patient left: in chair;with call bell/phone within reach Nurse Communication: Mobility status         Time: 1420-1446 PT Time Calculation (min): 26 min   Charges:   PT Evaluation $Initial PT Evaluation Tier I: 1 Procedure PT Treatments $Therapeutic Activity: 23-37 mins   PT G CodesDuncan Dull 05/20/2014, 4:10 PM Alben Deeds, Elizabeth DPT  [phone]

## 2014-05-20 NOTE — Procedures (Signed)
Objective Swallowing Evaluation: Fiberoptic Endoscopic Evaluation of Swallowing  Patient Details  Name: Austin Warren MRN: [id number] Date of Birth: 09/19/1940  Today's Date: 05/20/2014 Time: 1310-1345 SLP Time Calculation (min): 35 min  Past Medical History:  Past Medical History  Diagnosis Date  . Diabetes mellitus   . Hypertension   . Hyperlipidemia   . History of colon polyps   . Benign prostatic hypertrophy   . Arthritis   . Ventricular tachycardia     RBB/LAHB ideopathic VT, 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  . Melanoma     under arm  . Osteoarthritis   . Dyspnea on exertion 11/01/2011   Past Surgical History:  Past Surgical History  Procedure Laterality Date  . Replacement total knee    . Vasectomy    . Joint replacement      R TKR  . Replacement total knee Left   . Radiology with anesthesia N/A 05/18/2014    Procedure: RADIOLOGY WITH ANESTHESIA;  Surgeon: Rob Hickman, MD;  Location: [place];  Service: Radiology;  Laterality: N/A;   HPI:  Austin Warren is an 73 y.o. male who arrived via EMS as a code stroke with left gaze preference, right facial droop, right sided plegia. Patient was administered IV TPA followed by cerebral angio in IR where he was found to have a T occlusion with complete revascularization of Lt ICA terminus, Lt MCA and Lt ACA with 12mg  of IA TPA. MRI shows areas of acute infarction remain following revascularization of the LEFT ICA, ACA, and MCA, involving the medial LEFT temporal lobe uncus and LEFT basal ganglia. CXR shows cardiac enlargement with increasing pulmonary vascular congestion     Assessment / Plan / Recommendation Clinical Impression  Dysphagia Diagnosis: Mild oral phase dysphagia;Moderate pharyngeal phase dysphagia Clinical impression: Pt demonstrates a mild oral dysphagia with oral residuals present with large thin bolus and puree that fall to airway post swallow. There is an intermittent delay in swallow, evident with larger  thin boluses and resulting in aspiration with late sensation, only with straw x1. Pt again did have some spillage of oral residuals post swallow warranting cues for a second swallow if possible. Pt does have difficulty following oral motor commands. Recommend pt intiaite a dys 3 thin diet with full suerpvision for small sips and a second swallow. SLP will follow for tolerance.     Treatment Recommendation  Therapy as outlined in treatment plan below    Diet Recommendation Dysphagia 3 (Mechanical Soft);Thin liquid   Liquid Administration via: Cup;No straw Medication Administration: Whole meds with puree Supervision: Patient able to self feed;Full supervision/cueing for compensatory strategies Compensations: Slow rate;Small sips/bites;Multiple dry swallows after each bite/sip Postural Changes and/or Swallow Maneuvers: Seated upright 90 degrees;Upright 30-60 min after meal    Other  Recommendations Oral Care Recommendations: Oral care BID   Follow Up Recommendations  Inpatient Rehab    Frequency and Duration min 2x/week  2 weeks   Pertinent Vitals/Pain NA    SLP Swallow Goals     General Date of Onset: 05/18/14 HPI: Austin Warren is an 73 y.o. male who arrived via EMS as a code stroke with left gaze preference, right facial droop, right sided plegia. Patient was administered IV TPA followed by cerebral angio in IR where he was found to have a T occlusion with complete revascularization of Lt ICA terminus, Lt MCA and Lt ACA with 12mg  of IA TPA. MRI shows areas of acute infarction remain following revascularization of the LEFT  ICA, ACA, and MCA, involving the medial LEFT temporal lobe uncus and LEFT basal ganglia. CXR shows cardiac enlargement with increasing pulmonary vascular congestion Type of Study: Fiberoptic Endoscopic Evaluation of Swallowing Reason for Referral: Objectively evaluate swallowing function Diet Prior to this Study: NPO Temperature Spikes Noted: No Respiratory  Status: Nasal cannula History of Recent Intubation: Yes Length of Intubations (days): 1 days Date extubated: 05/19/14 Behavior/Cognition: Alert;Cooperative;Pleasant mood Oral Cavity - Dentition: Adequate natural dentition Self-Feeding Abilities: Able to feed self;Needs assist Patient Positioning: Upright in bed Baseline Vocal Quality: Wet;Hoarse Volitional Cough: Weak;Congested Volitional Swallow: Able to elicit Anatomy:  (mild ulcerations on tip of epiglottis) Pharyngeal Secretions: Normal    Reason for Referral Objectively evaluate swallowing function   Oral Phase Oral Preparation/Oral Phase Oral Phase: Impaired Oral - Thin Oral - Thin Cup: Within functional limits Oral - Thin Straw: Lingual/palatal residue Oral - Solids Oral - Puree: Lingual/palatal residue   Pharyngeal Phase Pharyngeal Phase Pharyngeal Phase: Impaired Pharyngeal - Thin Pharyngeal - Thin Cup: Within functional limits Pharyngeal - Thin Straw: Delayed swallow initiation;Penetration/Aspiration before swallow;Penetration/Aspiration after swallow;Moderate aspiration Penetration/Aspiration details (thin straw): Material enters airway, passes BELOW cords without attempt by patient to eject out (silent aspiration);Material enters airway, passes BELOW cords then ejected out;Material does not enter airway Pharyngeal - Solids Pharyngeal - Puree: Pharyngeal residue - valleculae;Pharyngeal residue - pyriform sinuses Pharyngeal - Mechanical Soft: Within functional limits  Cervical Esophageal Phase    GO             Herbie Baltimore, MA CCC-SLP [phone]  Lynann Beaver 05/20/2014, 2:33 PM

## 2014-05-20 NOTE — Plan of Care (Signed)
Problem: Consults Goal: Ischemic Stroke Patient Education See Patient Education Module for education specifics.  Outcome: Progressing Goal: Diabetes Guidelines if Diabetic/Glucose > 140 If diabetic or lab glucose is > 140 mg/dl - Initiate Diabetes/Hyperglycemia Guidelines & Document Interventions  Outcome: Progressing  Problem: tPA Day Progression Outcomes-Only if tPA administered Goal: Inclusion criteria for tPA STANDARD: < 3hours from symptoms onset: - Diagnosis of ischemic stroke causing measureable neurological deficit - Neurological signs shold not be minor & isolated - Symptoms of stroke should not be sugestive of SAH - No head trauma or prior stroke in previous 3 months - No gastrointestinal or urinary tract hemorrhage in previous 21 days - No arterial puncture at a noncompressible site in previous 7 days - BP not elevated [systolic <771 mmHg, diastolic < 165 mmHg] - Not taking oral anticoagulant or if being taken INR < or equal 1.7 - Platelet count > or equal 100,000 mm3 - No seizure w/postictal residual neurological impairments - Pt/family understand potential risks/benefits from treatment - Caution in pts with NIHSS > 22 - Seizure at stroke onset eligible for tPA if residual impairments due to stroke and not the seizures. - Neurological signs should not be clearing spontaneously - Exercise caution in treating pt with major deficits - Symptoms onset < 3hrs before beginning treatment - No MI in previous 3 months - No major surgery in previous 14 days - No history previous intracranial hemorrhage - No evidence active bleeding/acute trauma (fracture) on examinations - If receiving heparin in previous 48 hrs, aPTT must be normal range - Abnormal Blood Glucose < 50 or > 400 mg/dL - CT does not show multilobar infarction [hypodensity >1/3 cerebral hemisphere] EXTENDED: 3-4.5 hrous from symptom onset - Age > 80 years - History of prior stroke and diabetes - Any anticoagulant  use prior to admission (even if INR < 1.7) - NIHSS > 25"  Outcome: Completed/Met Date Met:  05/20/14

## 2014-05-20 NOTE — Plan of Care (Signed)
Problem: SLP Dysphagia Goals Goal: Patient will utilize recommended strategies Patient will utilize recommended strategies during swallow to increase swallowing safety with  Outcome: Progressing     

## 2014-05-20 NOTE — Progress Notes (Signed)
0130 hit Elink button. Patient complaining of difficulty breathing. Patient has weak cough and unable to clear throat. Tried incentive spirometer, but patient was unable to take deep breaths. Orders given for breathing treatment and stat chest xray.

## 2014-05-20 NOTE — Plan of Care (Signed)
Problem: tPA Day Progression Outcomes-Only if tPA administered Goal: Pre tPA NIH Stroke Scale documented Outcome: Completed/Met Date Met:  05/20/14 Goal: Pre tPA BP < 185/110 - see orders for mgmt Outcome: Completed/Met Date Met:  05/20/14 Goal: Pre tPA two IV sites established Outcome: Completed/Met Date Met:  05/20/14 Goal: Pre tPA foley catheter inserted Outcome: Not Met (add Reason) ED was unable to insert a foley Goal: Pre tPA panda/NGT inserted if need anticipated Outcome: Not Applicable Date Met:  98/26/41 Goal: Post tPA no anticoagulants/antiplatelets 24 hrs Outcome: Completed/Met Date Met:  05/20/14 Goal: Post tPA VS, neuro checks Outcome: Completed/Met Date Met:  05/20/14 Goal: Post tPA keep BP </equal 180/105 - see orders for mgmt Outcome: Completed/Met Date Met:  05/20/14 Goal: Post tPA call MD if neuro decline - plan for STAT CT Outcome: Completed/Met Date Met:  05/20/14 Goal: Post tPA CT brain w/o contrast 24 hrs post tPA Outcome: Completed/Met Date Met:  05/20/14 Goal: Post tPA bedrest for 24 hours Outcome: Completed/Met Date Met:  05/20/14

## 2014-05-20 NOTE — Evaluation (Signed)
Speech Language Pathology Evaluation Patient Details Name: Austin Warren MRN: [id number] DOB: 05/05/1941 Today's Date: 05/20/2014 Time: 8315-1761 SLP Time Calculation (min): 42 min  Problem List:  Patient Active Problem List   Diagnosis Date Noted  . Acute diastolic CHF (congestive heart failure) 05/20/2014  . Stroke   . Atrial fibrillation, new onset 05/18/2014  . CVA (cerebral vascular accident) 05/18/2014  . Essential hypertension 05/18/2014  . Diabetes mellitus 05/18/2014  . Acute ischemic stroke   . low-voltage ECG 03/05/2013  . Hypersomnolence 03/05/2013  . Morbid obesity 03/05/2013  . Obesity (BMI 30-39.9) 03/05/2013  . Dyspnea on exertion 11/01/2011  . Edema 11/01/2011  . Ventricular tachycardia 12/01/2010  . Abnormal  signal averaged electrocardiogram 12/01/2010   Past Medical History:  Past Medical History  Diagnosis Date  . Diabetes mellitus   . Hypertension   . Hyperlipidemia   . History of colon polyps   . Benign prostatic hypertrophy   . Arthritis   . Ventricular tachycardia     RBB/LAHB ideopathic VT, 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  . Melanoma     under arm  . Osteoarthritis   . Dyspnea on exertion 11/01/2011   Past Surgical History:  Past Surgical History  Procedure Laterality Date  . Replacement total knee    . Vasectomy    . Joint replacement      R TKR  . Replacement total knee Left   . Radiology with anesthesia N/A 05/18/2014    Procedure: RADIOLOGY WITH ANESTHESIA;  Surgeon: Rob Hickman, MD;  Location: [facility];  Service: Radiology;  Laterality: N/A;   HPI:  Austin Warren is an 73 y.o. male who arrived via EMS as a code stroke with left gaze preference, right facial droop, right sided plegia. Patient was administered IV TPA followed by cerebral angio in IR where he was found to have a T occlusion with complete revascularization of Lt ICA terminus, Lt MCA and Lt ACA with 12mg  of IA TPA. MRI shows areas of acute infarction remain  following revascularization of the LEFT ICA, ACA, and MCA, involving the medial LEFT temporal lobe uncus and LEFT basal ganglia. 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 He has difficulty following commands due to apraxia. SLP provided maximum verbal, visual, and contextual cueing for word finding and repetition of single words which the pt was not able to produce. Given contextual cueing pt is able to complete functional tasks with minimal verbal direction. Recommend speech therapy services to focus on expressive and receptive language deficits.     SLP Assessment  Patient needs continued Speech Lanaguage Pathology Services    Follow Up Recommendations  Inpatient Rehab    Frequency and Duration min 2x/week  2 weeks   Pertinent Vitals/Pain Pain Assessment: No/denies pain   SLP Goals  Potential to Achieve Goals: Good  SLP Evaluation Prior Functioning  Cognitive/Linguistic Baseline: Within functional limits Type of Home: House  Lives With: Spouse Available Help at Discharge: Family   Cognition  Overall Cognitive Status: Difficult to assess Arousal/Alertness: Awake/alert Orientation Level: Oriented to person;Oriented to place;Oriented to time;Oriented to situation Attention: Focused;Sustained;Selective Focused Attention: Appears intact Sustained Attention: Appears intact Selective Attention: Appears intact Memory:  (difficult to asses due to language deficits) Awareness: Impaired Awareness Impairment: Anticipatory impairment Problem Solving: Appears intact    Comprehension  Auditory Comprehension Overall Auditory Comprehension: Impaired Yes/No Questions:  Not tested Commands: Impaired One Step Basic Commands: 0-24% accurate Conversation: Simple Visual Recognition/Discrimination Discrimination: Exceptions to  [facility] Common Objects: Unable to indentify Reading Comprehension Reading Status: Within funtional limits    Expression Expression Primary Mode of Expression: Verbal Verbal Expression Overall Verbal Expression: Impaired Initiation: No impairment Automatic Speech: Month of year Level of Generative/Spontaneous Verbalization: Phrase Repetition: Impaired Level of Impairment: Word level Naming: Impairment Responsive: 0-25% accurate Confrontation: Impaired Common Objects: Unable to indentify Verbal Errors: Semantic paraphasias;Phonemic paraphasias;Aware of errors;Not aware of errors (aware of some errors) Pragmatics: No impairment Written Expression Dominant Hand: Right Written Expression: Exceptions to [employer] Copy Ability: Letter Self Formulation Ability: Letter   Oral / Motor Oral Motor/Sensory Function Overall Oral Motor/Sensory Function: Impaired (weakness, unable to assess due to inability to follow comman) Motor Speech Overall Motor Speech: Impaired Respiration: Within functional limits Phonation: Hoarse;Wet Resonance: Within functional limits Articulation: Impaired Level of Impairment: Word Intelligibility: Intelligible Motor Planning: Impaired Level of Impairment: Word Motor Speech Errors: Aware;Groping for words;Consistent   GO     [name] 05/20/2014, 10:14 AM

## 2014-05-20 NOTE — Plan of Care (Signed)
Problem: Consults Goal: Ischemic Stroke Patient Education See Patient Education Module for education specifics.  Outcome: Completed/Met Date Met:  05/20/14 Goal: Skin Care Protocol Initiated - if Braden Score 18 or less If consults are not indicated, leave blank or document N/A  Outcome: Completed/Met Date Met:  05/20/14 Goal: Nutrition Consult-if indicated Outcome: Progressing Goal: Diabetes Guidelines if Diabetic/Glucose > 140 If diabetic or lab glucose is > 140 mg/dl - Initiate Diabetes/Hyperglycemia Guidelines & Document Interventions  Outcome: Completed/Met Date Met:  05/20/14  Problem: tPA Day Progression Outcomes-Only if tPA administered Goal: Post tPA fall precautions Outcome: Completed/Met Date Met:  05/20/14 Goal: Post tPA monitor for S/S of bleeding Outcome: Completed/Met Date Met:  05/20/14 Goal: Post tPA image without hemorrhage Outcome: Completed/Met Date Met:  05/20/14 Goal: Post tPA no S/S of hemorrhage elsewhere Outcome: Completed/Met Date Met:  05/20/14 Goal: Post tPA neurologically at baseline or improved CRITERIA FOR NEUROLOGICALLY AT BASELINE OR IMPROVED: - NO S/S OF INC. ICP - AWAKE, ALERT, ORIENTED X3 - SPEECH CLEAR, APPROPRIATE - PERRL - EOMS, BLINK INTACT - FACE SYMMETRICAL - TONGUE/TRACH MIDLINE - GRIPS, PUSH/PULL EQUAL - NO PRONATOR DRIFT - DORSIPLANTAR FLEXION EQUAL - MOVES ALL EXTREMITIES - SENSATION INTACT - NO NUCHAL RIGIDITY OR PHOTOPHOBIA  Outcome: Progressing Goal: Post tPA neuro decline/bleeding complications guide BLEEDING COMPLICATION GUIDE: - STOP TPA - NOTIFY MD ASAP - IF COMPRESSIBLE, HOLD DIRECT PRESSURE - IF NONCOMPRESSIBLE OR UNCONTROLLED BLEEDS, ANTICIPATE MD MAY ORDER STAT:  --- 5 UNITS CRYOPRECIPITATE,  --- 2-3 UNITS FFP IF NEEDED,  --- PLATELET TRANSFUSION IF NEEDED,  --- PRBCS IF NEEDED  Outcome: Completed/Met Date Met:  05/20/14

## 2014-05-20 NOTE — Progress Notes (Signed)
Rehab Admissions Coordinator Note:  Patient was screened by Savir Blanke L for appropriateness for an Inpatient Acute Rehab Consult.  At this time, we are recommending Inpatient Rehab consult.  Trejuan Matherne, PT Rehabilitation Admissions Coordinator [phone]  

## 2014-05-20 NOTE — Progress Notes (Signed)
PULMONARY / CRITICAL CARE MEDICINE   Name: Austin DAUGHTRIDGE MRN: [id number] DOB: 06/25/1941    ADMISSION DATE:  05/18/2014 CONSULTATION DATE:  05/20/2014  REFERRING MD :  Nicole Kindred  CHIEF COMPLAINT:  CVA s/p IR revascularization  INITIAL PRESENTATION: 73 y.o. Austin Warren brought to [facility] ED on 11/3 as code stroke.  In ED, found to have dense left MCA sign.  tPA administered and pt taken to IR for revascularization.  Pt returned to ICU on vent.     STUDIES:  CT Head 11/3 >>> dense left MCA sign. CT Head 11/3 (post IR) >>> suggestion of minimal hypodensity within the left basal ganglia region possibly representing age indeterminate ischemic change; left MCA gray white differentiation Echo 11/4 >>> LVEF 45-50%, inferior and apical akinesis, mild MR, LAE, PA pressure up 74mmHg  SIGNIFICANT EVENTS: 11/3 - underwent revascularization by IR, admitted to neuro ICU 11/4 extubated  SUBJECTIVE: extubated yesterday moving R side more  VITAL SIGNS: Temp:  [97.7 F (36.5 C)-98.4 F (36.9 C)] 97.9 F (36.6 C) (11/05 0738) Pulse Rate:  [44-106] 106 (11/05 1000) Resp:  [15-37] 22 (11/05 1000) BP: (118-156)/(61-103) 143/76 mmHg (11/05 1000) SpO2:  [91 %-100 %] 94 % (11/05 1000) FiO2 (%):  [40 %] 40 % (11/04 1630) HEMODYNAMICS:   VENTILATOR SETTINGS: Vent Mode:  [-] PSV;CPAP FiO2 (%):  [40 %] 40 % Set Rate:  [14 bmp] 14 bmp Vt Set:  [550 mL] 550 mL PEEP:  [5 cmH20] 5 cmH20 Pressure Support:  [5 cmH20] 5 cmH20 Plateau Pressure:  [13 cmH20] 13 cmH20 INTAKE / OUTPUT: Intake/Output      11/04 0701 - 11/05 0700 11/05 0701 - 11/06 0700   I.V. (mL/kg) 1318 (12.1) 30 (0.3)   Total Intake(mL/kg) 1318 (12.1) 30 (0.3)   Urine (mL/kg/hr) 4479 (1.7) 1400 (3.4)   Blood     Total Output 4479 1400   Net -3161 -1370          PHYSICAL EXAMINATION: General: awake, interactive, following commands HEENT NCAT, EOMi, OP clear PULM: CTA B CV: Irreg irreg AB: BS+ soft, nontender Ext: warm Neuro: left upper and  lower ext 5/5, R side> 3/5 arm and leg strength LABS:  CBC  Recent Labs Lab 05/18/14 1504 05/18/14 1516 05/19/14 0510 05/20/14 0235  WBC 6.2  --  8.6 9.4  HGB 14.8 15.6 13.4 14.3  HCT 43.7 46.0 39.9 43.3  PLT 167  --  135* 144*   Coag's  Recent Labs Lab 05/18/14 1504  APTT 30  INR 1.11   BMET  Recent Labs Lab 05/18/14 1504 05/18/14 1516 05/19/14 0510 05/20/14 0235  NA 139 138 137 141  K 4.3 3.9 3.8 3.6*  CL 101 102 102 105  CO2 24  --  23 23  BUN 18 18 11 8   CREATININE 0.87 1.00 0.60 0.64  GLUCOSE 115* 117* 146* 135*   Electrolytes  Recent Labs Lab 05/18/14 1504 05/19/14 0510 05/19/14 1100 05/20/14 0235 05/20/14 0239  CALCIUM 9.4 7.9*  --  8.2*  --   MG  --   --  1.8  --  1.8  PHOS  --   --   --   --  2.7   Sepsis Markers No results for input(s): LATICACIDVEN, PROCALCITON, O2SATVEN in the last 168 hours. ABG  Recent Labs Lab 05/18/14 2011  PHART 7.335*  PCO2ART 43.3  PO2ART 90.0   Liver Enzymes  Recent Labs Lab 05/18/14 1504  AST 38*  ALT 41  ALKPHOS 44  BILITOT 0.5  ALBUMIN 3.9   Cardiac Enzymes No results for input(s): TROPONINI, PROBNP in the last 168 hours. Glucose  Recent Labs Lab 05/19/14 1141 05/19/14 1611 05/19/14 1919 05/20/14 0039 05/20/14 0344 05/20/14 0736  GLUCAP 125* 120* 121* 117* 119* 140*    Imaging Mr Brain Wo Contrast  05/19/2014   CLINICAL DATA:  LEFT MCA infarct status post IV tPA and mechanical revascularization. Embolus secondary to newly diagnosed atrial fibrillation. RIGHT hemiparesis. Initial encounter.  EXAM: MRI HEAD WITHOUT CONTRAST  TECHNIQUE: Multiplanar, multiecho pulse sequences of the brain and surrounding structures were obtained without intravenous contrast.  COMPARISON:  CT head 05/18/2014.  FINDINGS: There is restricted diffusion in the LEFT basal ganglia, LEFT periventricular white matter, and medial LEFT temporal lobe (uncus) consistent with areas of acute infarction related to LEFT MCA  occlusion with subsequent revascularization. No insular, frontal, lateral temporal, or parietal cortical infarction. No RIGHT hemisphere, brainstem or cerebellar acute infarction.  Generalized atrophy. Mild white matter signal abnormality, probable chronic microvascular ischemic change. No hemorrhagic transformation. Flow voids proximally have been reestablished including LEFT MCA M1 segment. No midline shift. Calvarium intact. Minor sinus fluid.  IMPRESSION: Areas of acute infarction remain following revascularization of the LEFT ICA, ACA, and MCA, involving the medial LEFT temporal lobe uncus and LEFT basal ganglia. No hemorrhagic transformation. No other areas of convexity cortical infarction are observed.  Atrophy and small vessel disease, similar to priors.   Electronically Signed   By: Rolla Flatten M.D.   On: 05/19/2014 15:42   Portable Chest Xray  05/19/2014   CLINICAL DATA:  Intubated patient  EXAM: PORTABLE CHEST - 1 VIEW  COMPARISON:  Chest x-ray dated November 12, 2010  FINDINGS: The lungs are mildly hypoinflated. There is increased density in the left upper lobe. The cardiac silhouette is enlarged. The pulmonary vascularity is not engorged. There is no pleural effusion or pneumothorax. The endotracheal tube tip lies approximately 3.5 cm above the crotch of the carina. The observed bony thorax is unremarkable.  IMPRESSION: Left upper lobe infiltrate or atelectasis. Cardiomegaly without evidence of pulmonary edema.   Electronically Signed   By: David  Martinique   On: 05/19/2014 08:30     ASSESSMENT / PLAN:  NEUROLOGIC A:   Acute CVA - s/p complete revascularization of total occlusion of left ICA / MCA / ACA Acute encephalopathy - resolved P:   Per neurology  PULMONARY OETT 11/3 >>>11/4 A: Acute respiratory failure - resolved P:   Aspiration precautions O2 as needed for O2 > 92% Incentive spirometry Flutter  CARDIOVASCULAR A:  New onset A.Fib, rate controlled Hx HTN, HLD, Vtach P:   Per neurology and cardiology Lasix x1 today  RENAL A:   No acute issues P:   KVO fluids BMP in AM.  GASTROINTESTINAL A:   GI prophylaxis Nutrition P:   SLP eval  HEMATOLOGIC A:   VTE Prophylaxis P:  SCD's only. Cbc intermittently   INFECTIOUS A:   No evidence of infection P:   Monitor clinically.  ENDOCRINE A:   DM P:   CBG's q4hr. SSI. Hold outpatient metformin.  Family updated: At bedside on 11/5   Interdisciplinary Family Meeting v Palliative Care Meeting:  Due by: 11/10.  PCCM will sign off, call if questions

## 2014-05-21 ENCOUNTER — Encounter (HOSPITAL_COMMUNITY): Payer: Self-pay | Admitting: *Deleted

## 2014-05-21 LAB — CBC WITH DIFFERENTIAL/PLATELET
Basophils Absolute: 0 10*3/uL (ref 0.0–0.1)
Basophils Relative: 0 % (ref 0–1)
EOS ABS: 0 10*3/uL (ref 0.0–0.7)
Eosinophils Relative: 0 % (ref 0–5)
HCT: 43.9 % (ref 39.0–52.0)
HEMOGLOBIN: 14.8 g/dL (ref 13.0–17.0)
LYMPHS ABS: 1.3 10*3/uL (ref 0.7–4.0)
Lymphocytes Relative: 12 % (ref 12–46)
MCH: 32.7 pg (ref 26.0–34.0)
MCHC: 33.7 g/dL (ref 30.0–36.0)
MCV: 96.9 fL (ref 78.0–100.0)
MONOS PCT: 11 % (ref 3–12)
Monocytes Absolute: 1.2 10*3/uL — ABNORMAL HIGH (ref 0.1–1.0)
NEUTROS ABS: 8.2 10*3/uL — AB (ref 1.7–7.7)
NEUTROS PCT: 77 % (ref 43–77)
Platelets: 165 10*3/uL (ref 150–400)
RBC: 4.53 MIL/uL (ref 4.22–5.81)
RDW: 13.1 % (ref 11.5–15.5)
WBC: 10.7 10*3/uL — AB (ref 4.0–10.5)

## 2014-05-21 LAB — BASIC METABOLIC PANEL
ANION GAP: 18 — AB (ref 5–15)
BUN: 13 mg/dL (ref 6–23)
CHLORIDE: 100 meq/L (ref 96–112)
CO2: 24 mEq/L (ref 19–32)
Calcium: 8.8 mg/dL (ref 8.4–10.5)
Creatinine, Ser: 0.65 mg/dL (ref 0.50–1.35)
Glucose, Bld: 125 mg/dL — ABNORMAL HIGH (ref 70–99)
POTASSIUM: 3.2 meq/L — AB (ref 3.7–5.3)
SODIUM: 142 meq/L (ref 137–147)

## 2014-05-21 LAB — GLUCOSE, CAPILLARY
Glucose-Capillary: 160 mg/dL — ABNORMAL HIGH (ref 70–99)
Glucose-Capillary: 148 mg/dL — ABNORMAL HIGH (ref 70–99)
Glucose-Capillary: 153 mg/dL — ABNORMAL HIGH (ref 70–99)
Glucose-Capillary: 110 mg/dL — ABNORMAL HIGH (ref 70–99)

## 2014-05-21 MED ORDER — VERAPAMIL HCL ER 180 MG PO TBCR
180.0000 mg | EXTENDED_RELEASE_TABLET | Freq: Every day | ORAL | Status: DC
  Administered 2014-05-21 – 2014-05-24 (×4): 180 mg via ORAL
  Filled 2014-05-21 (×5): qty 1

## 2014-05-21 MED ORDER — HEPARIN SODIUM (PORCINE) 5000 UNIT/ML IJ SOLN
5000.0000 [IU] | Freq: Three times a day (TID) | INTRAMUSCULAR | Status: DC
  Administered 2014-05-21 – 2014-05-24 (×8): 5000 [IU] via SUBCUTANEOUS
  Filled 2014-05-21 (×7): qty 1

## 2014-05-21 MED ORDER — POTASSIUM CHLORIDE CRYS ER 20 MEQ PO TBCR
40.0000 meq | EXTENDED_RELEASE_TABLET | ORAL | Status: AC
Start: 2014-05-21 — End: 2014-05-21
  Administered 2014-05-21 (×3): 40 meq via ORAL
  Filled 2014-05-21 (×3): qty 2

## 2014-05-21 NOTE — Progress Notes (Signed)
Physical Therapy Treatment Patient Details Name: Austin Warren MRN: [id number] DOB: 07-17-1940 Today's Date: 05/21/2014    History of Present Illness 73 y.o. Austin Warren brought to [facility] ED on 11/3 as code stroke.  In ED, found to have dense left MCA sign.  tPA administered and pt taken to IR for revascularization.  Pt returned to ICU on vent, extubated 11/4.    PT Comments    Pt having difficulty following directions today and needs verbal and visual cueing.  Pt insistent that he is 73 yrs old despite orienting to birth date and today's date.  Feel pt will need CIR at D/C to maximize independence prior to returning to home.    Follow Up Recommendations  CIR     Equipment Recommendations   (TBD)    Recommendations for Other Services       Precautions / Restrictions Precautions Precautions: Fall Precaution Comments: expressive difficulties Restrictions Weight Bearing Restrictions: No    Mobility  Bed Mobility Overal bed mobility: Needs Assistance Bed Mobility: Supine to Sit     Supine to sit: Min assist;HOB elevated     General bed mobility comments: pt attempts  to use bed rail, though over shoots with R UE.  Needs A bringing trunk up to sitting.    Transfers Overall transfer level: Needs assistance Equipment used: None Transfers: Sit to/from [facility] Sit to Stand: Min assist Stand pivot transfers: Min assist       General transfer comment: pt with R LE weakness and needs cueing to get closer to chair prior to sitting.  pt with posterior lean needing MinA to correct LOB.    Ambulation/Gait                 Stairs            Wheelchair Mobility    Modified Rankin (Stroke Patients Only)       Balance Overall balance assessment: Needs assistance Sitting-balance support: Feet supported;No upper extremity supported Sitting balance-Leahy Scale: Fair Sitting balance - Comments: difficulty accepting challenge Postural control: Posterior  lean Standing balance support: During functional activity Standing balance-Leahy Scale: Poor Standing balance comment: pt leans posteriorly and needs MinA to prevent LOB.                      Cognition Arousal/Alertness: Awake/alert Behavior During Therapy: WFL for tasks assessed/performed Overall Cognitive Status: Impaired/Different from baseline Area of Impairment: Orientation;Attention;Memory;Following commands;Safety/judgement;Awareness;Problem solving Orientation Level: Disoriented to;Time;Situation Current Attention Level: Sustained Memory: Decreased short-term memory Following Commands: Follows one step commands inconsistently Safety/Judgement: Decreased awareness of safety;Decreased awareness of deficits Awareness: Intellectual Problem Solving: Slow processing;Decreased initiation;Requires verbal cues;Difficulty sequencing;Requires tactile cues General Comments: pt having difficulty following simple directions even when given demonstration.  pt insistant that he is only 73yrs old.      Exercises      General Comments        Pertinent Vitals/Pain Pain Assessment: No/denies pain    Home Living                      Prior Function            PT Goals (current goals can now be found in the care plan section) Acute Rehab PT Goals Patient Stated Goal: to get up PT Goal Formulation: With patient Time For Goal Achievement: 06/03/14 Potential to Achieve Goals: Good Progress towards PT goals: Progressing toward goals    Frequency  Min 3X/week  PT Plan Current plan remains appropriate    Co-evaluation             End of Session Equipment Utilized During Treatment: Gait belt;Oxygen Activity Tolerance: Patient tolerated treatment well Patient left: in chair;with call bell/phone within reach;with nursing/sitter in room;with family/visitor present     Time: 7169-6789 PT Time Calculation (min): 25 min  Charges:  $Therapeutic Activity: 23-37  mins                    G CodesCatarina Hartshorn, [place] 05/21/2014, 8:40 AM

## 2014-05-21 NOTE — Progress Notes (Signed)
STROKE TEAM PROGRESS NOTE   HISTORY Austin Warren is an 73 y.o. male who was normal this AM 05/18/2014. He went to the bank at 12:30 and noted he had blurred vision. When he came home he mentioned this to his wife and she called his PCP. They were told to take his BP. As they were taking his BP he noted he could not get up from the chair and was unable to move his right side. EMS was called and patient was brought to ED as a code stroke. On arrival he showed a left gaze gaze preference, right facial droop, right sided plegia. Initial CT head showed no acute infarct but did demonstrate a large dense left MCA sign. IR was contacted. IV tPA was started and patient was brought back to IR for cerebral angiogram. NIH stroke score was 23. EKG showed atrial fibrillation, which had not previously been known. Patient was administered IV TPA followed by cerebral angio in IR where he was found to have a T occlusion with complete revascularization of Lt ICA terminus, Lt MCA and Lt ACA with 12mg  of IA TPA, 1 pass with Trevoprovue, and 1 pass with Solitaire withTICI 3 revascularization. He was admitted to the neuro ICU for further evaluation and treatment.   SUBJECTIVE (INTERVAL HISTORY) No family is at the bedside.  Overall his condition is gradually improving.  S/p tPA and IR, extubated 2 days ago. BP adequately controlled and MRI showed left BG and temporal small stroke.     OBJECTIVE Temp:  [97.4 F (36.3 C)-99.2 F (37.3 C)] 98.6 F (37 C) (11/06 1831) Pulse Rate:  [55-108] 82 (11/06 1831) Cardiac Rhythm:  [-] Atrial fibrillation (11/06 1400) Resp:  [16-24] 20 (11/06 1831) BP: (133-157)/(67-109) 141/67 mmHg (11/06 1831) SpO2:  [92 %-100 %] 98 % (11/06 1831)   Recent Labs Lab 05/20/14 1139 05/20/14 1719 05/20/14 2146 05/21/14 0834 05/21/14 1120  GLUCAP 139* 112* 123* 160* 148*    Recent Labs Lab 05/18/14 1504 05/18/14 1516 05/19/14 0510 05/19/14 1100 05/20/14 0235  05/20/14 0239 05/21/14 0148  NA 139 138 137  --  141  --  142  K 4.3 3.9 3.8  --  3.6*  --  3.2*  CL 101 102 102  --  105  --  100  CO2 24  --  23  --  23  --  24  GLUCOSE 115* 117* 146*  --  135*  --  125*  BUN 18 18 11   --  8  --  13  CREATININE 0.87 1.00 0.60  --  0.64  --  0.65  CALCIUM 9.4  --  7.9*  --  8.2*  --  8.8  MG  --   --   --  1.8  --  1.8  --   PHOS  --   --   --   --   --  2.7  --     Recent Labs Lab 05/18/14 1504  AST 38*  ALT 41  ALKPHOS 44  BILITOT 0.5  PROT 6.9  ALBUMIN 3.9    Recent Labs Lab 05/18/14 1504 05/18/14 1516 05/19/14 0510 05/20/14 0235 05/21/14 0148  WBC 6.2  --  8.6 9.4 10.7*  NEUTROABS 4.2  --  7.1 7.6 8.2*  HGB 14.8 15.6 13.4 14.3 14.8  HCT 43.7 46.0 39.9 43.3 43.9  MCV 96.0  --  95.7 99.1 96.9  PLT 167  --  135* 144* 165   No results for  input(s): CKTOTAL, CKMB, CKMBINDEX, TROPONINI in the last 168 hours. No results for input(s): LABPROT, INR in the last 72 hours.  Recent Labs  05/19/14 0605  COLORURINE YELLOW  LABSPEC 1.027  PHURINE 6.0  GLUCOSEU NEGATIVE  HGBUR SMALL*  BILIRUBINUR NEGATIVE  KETONESUR NEGATIVE  PROTEINUR NEGATIVE  UROBILINOGEN 0.2  NITRITE NEGATIVE  LEUKOCYTESUR NEGATIVE       Component Value Date/Time   CHOL 124 05/19/2014 0510   TRIG 142 05/19/2014 0510   HDL 38* 05/19/2014 0510   CHOLHDL 3.3 05/19/2014 0510   VLDL 28 05/19/2014 0510   LDLCALC 58 05/19/2014 0510   Lab Results  Component Value Date   HGBA1C 6.3* 05/19/2014      Component Value Date/Time   LABOPIA NONE DETECTED 05/19/2014 0605   COCAINSCRNUR NONE DETECTED 05/19/2014 0605   LABBENZ NONE DETECTED 05/19/2014 0605   AMPHETMU NONE DETECTED 05/19/2014 0605   THCU NONE DETECTED 05/19/2014 0605   LABBARB NONE DETECTED 05/19/2014 0605     Recent Labs Lab 05/18/14 1504  ETH <11    Ct Head Wo Contrast 05/18/2014 Suggestion of minimal hypodensity within the left basal ganglia region possibly representing age  indeterminate ischemic change. Overall however, there is relative preservation of the gray-white differentiation within the left MCA territory without suggestion of a large infarct visible at this time. Increased density along the falx and tentorium is favored to represent sequelae of contrast administration.  05/18/2014 Atrophy and minimal chronic microvascular ischemia. No acute abnormality. Dense left MCA compatible with thrombosis.   Cerebral Angio  05/18/2014 S/P bilateral common carotid arteriograms , followed by complete revascularization of T occlusion of Lt ICA terminus ,Lt MCA and Lt ACA with 12mg  of superselective IA TPA , and xi pass with Trevoprovue 4 x 30 mm ,and x 1 pass with Solitaire FR 4 mm x 40 mm with TICI 3 revascularization  MRI head Areas of acute infarction remain following revascularization of the LEFT ICA, ACA, and MCA, involving the medial LEFT temporal lobe uncus and LEFT basal ganglia. No hemorrhagic transformation. No other areas of convexity cortical infarction are observed. Atrophy and small vessel disease, similar to priors.  2D echo - Left ventricle: The cavity size was mildly dilated. Wall thickness was increased in a pattern of moderate LVH. There was mild concentric hypertrophy. Systolic function was mildly reduced. The estimated ejection fraction was in the range of 45% to 50%. There is hypokinesis of the inferior and apical myocardium. Doppler parameters are consistent with high ventricular filling pressure. - Mitral valve: Calcified annulus. Mildly thickened leaflets . There was mild regurgitation. - Left atrium: The atrium was mildly dilated. - Right ventricle: The cavity size was at the upper limits of normal. Wall thickness was mildly increased. - Right atrium: The atrium was mildly dilated. - Pulmonary arteries: Systolic pressure was mildly to moderately increased. PA peak pressure: 47 mm Hg (S).  PHYSICAL  EXAM  Temp:  [97.4 F (36.3 C)-99.2 F (37.3 C)] 98.6 F (37 C) (11/06 1831) Pulse Rate:  [55-108] 82 (11/06 1831) Resp:  [16-24] 20 (11/06 1831) BP: (133-157)/(67-109) 141/67 mmHg (11/06 1831) SpO2:  [92 %-100 %] 98 % (11/06 1831)  General - Well nourished, well developed, in no apparent distress.  Ophthalmologic - not able to see through.  Cardiovascular - Irregularly irregular rate and rhythm   Mental Status -  Level of arousal and orientation to place, and person were intact, but not to time. Language including mild expressive aphasia and difficulty with  naming but intact repetition and comprehension.  Cranial Nerves II - XII - II - Visual field intact OU. III, IV, VI - Extraocular movements intact. V - Facial sensation intact bilaterally. VII - right facial droop. VIII - Hearing & vestibular intact bilaterally. X - Palate elevates symmetrically. XI - Chin turning & shoulder shrug intact bilaterally. XII - Tongue protrusion intact.  Motor Strength - The patient's strength was right UE 4-/5 with drift and right LE 4/5 with mild drift, LUE and LLE 5/5.  Bulk was normal and fasciculations were absent.   Motor Tone - Muscle tone was assessed at the neck and appendages and was normal.  Reflexes - The patient's reflexes were decreased in all extremities and he had right babinski.  Sensory - Light touch, temperature/pinprick were assessed and were normal.    Coordination - The patient had normal movements in the hands with no ataxia or dysmetria.  Tremor was absent.  Gait and Station - not tested.   ASSESSMENT/PLAN [name] is a 73 y.o. male with PMH of DM, HTN, HLD, VT, BPH presenting with left gaze preference, right facial droop, right sided plegia. He did receive IV t-PA 05/18/2014 at 1536. He was then taken to IR for a bilateral cerebral angio where he was found to have a T occlusion with complete revascularization of Lt ICA terminus, Lt MCA and Lt ACA with  12mg  of IA TPA, 1 pass with Trevoprovue, and 1 pass with Solitaire with resultantTICI 3 revascularization.  Stroke: Dominant left MCA infarct s/p IV tPA, IA tPA and mechanical revasculaization of T occlusion. Infarct embolic secondary to newly diagnosed atrial fibrillation  S/P bilateral common carotid arteriograms , followed by  Resultant right facial droop, dysarthria and right hemiparesis  MRI- acute infarction remain following revascularization of the LEFT ICA, ACA, and MCA, involving the medial LEFT temporal lobe uncus and LEFT basal ganglia. No hemorrhagic transformation  Canceled MRA and Carotid Doppler given cerebral angio  2D Echo ejection fraction was in the range of 45 to 50%.   Heparin subq for VTE prophylaxis  DIET DYS 3   aspirin 81 mg orally every day prior to admission, now on ASA 325mg   Due to new onset afib, will need NOAC. Will start NOAC on Monday to avoid high risk of hemorrhagic transformation.  Ongoing aggressive risk factor management  Therapy recommendations:  CIR consult  Disposition:  CIR pending for bed   New onset afib - on ASA 325mg  - start NOAC on Monday to avoid risk of hemorrhagic transformation - tele monitor - cardiology on board - resume coreg, verapamil, and torsemide  BPH - had urinary retention - put on special foley catheter - remove today - watch for urinary retention - resume tamsulosin and proscar  Hypertension  Home meds:   Verapamil, coreg  Stable  Resume home meds  Permissive hypertension for 24-48 hours and then gradually normalized in 5-7 days.  Hyperlipidemia  Home meds:  Tricor, order resumed in hospital, however pt unable to swallow at this time  LDL 58, at goal < 70  On lipitor 20mg   Continue statin at discharge  Diabetes  Home meds:  Glucophage  HgbA1c 6.3 on the  goal < 6.5  SSI  Other Stroke Risk Factors Advanced age ETOH use . Obesity, Body mass index is 36.65 kg/(m^2).  Marland Kitchen Family hx  stroke (father)  Other Active Problems  Ventricular tachycardia - resumed verapamil  Other Pertinent History  Hx melanoma  Hospital day #  [place], MD PhD Stroke Neurology 05/21/2014 7:51 PM     To contact Stroke Continuity provider, please refer to [email or web address] After hours, contact General Neurology

## 2014-05-21 NOTE — Progress Notes (Signed)
Rehab admissions - Evaluated for possible admission.  Currently rehab beds are full.  Awaiting OT eval and then I will open the case with [facility] to request acute inpatient rehab admission.  I will follow up Monday am for progress and plans.  Call me for questions.  #[phone]

## 2014-05-21 NOTE — Progress Notes (Signed)
Pt arrived to 4N15. Alert and united x 4. Pt placed on tele box 16. Oriented to unit. Call bell and phone within reach. Bed alarm on. Will continue to monitor.

## 2014-05-21 NOTE — Plan of Care (Signed)
Problem: tPA Day Progression Outcomes-Only if tPA administered Goal: Post tPA neurologically at baseline or improved CRITERIA FOR NEUROLOGICALLY AT BASELINE OR IMPROVED: - NO S/S OF INC. ICP - AWAKE, ALERT, ORIENTED X3 - SPEECH CLEAR, APPROPRIATE - PERRL - EOMS, BLINK INTACT - FACE SYMMETRICAL - TONGUE/TRACH MIDLINE - GRIPS, PUSH/PULL EQUAL - NO PRONATOR DRIFT - DORSIPLANTAR FLEXION EQUAL - MOVES ALL EXTREMITIES - SENSATION INTACT - NO NUCHAL RIGIDITY OR PHOTOPHOBIA  Outcome: Progressing  Problem: Acute Treatment Outcomes Goal: BP within ordered parameters Outcome: Completed/Met Date Met:  05/21/14 Goal: tPA Patient w/o S&S of bleeding Outcome: Completed/Met Date Met:  05/21/14  Problem: Progression Outcomes Goal: Communication method established Outcome: Progressing Goal: If vent dependent, tolerates weaning Outcome: Not Applicable Date Met:  00/16/42 Goal: Rehab Team goals identified Outcome: Progressing Goal: Progressive activity as tolerated Outcome: Progressing Goal: Tolerating diet/TF at goal rate Outcome: Progressing

## 2014-05-21 NOTE — Consult Note (Signed)
Physical Medicine and Rehabilitation Consult   Reason for Consult: Right sided weakness, left gaze preference, right facial droop Referring Physician: Dr. Leonie Man   HPI: Austin Warren is a 73 y.o. male with history of DM type 2, V tach, HTN, who was admitted on 05/18/14 with right sided weakness, right facial droop and left gaze preference. CT of head with large dense L-MCA sign and he received IV tPA and underwent cerebral angio with complete revascularization of occlusion of L-ICA terminus, L -MCA and L-ACA recanalization with IA tPA and clot retrieval. Intubated post procedure thorough 11/04.  A Flutter noted on telemetry and Dr. Caryl Comes consulted for input. He recommended NOAC, verapamil when able to take po's as well as questioned need for cardioversion.  Acute diastolic CHF treated with IV diuresis. 2D echo with EF 45-50% with hypokinesis of inferior and apical myocardium, moderate LVH and mild MVR.   MRI brain done yesterday revealing areas of acute infarct involving medial LEFT temporal lobe uncus and LEFT basal ganglia and no hemorrhagic transformation. Speech therapy evaluation done yesterday revealing expressive and receptive deficits with apraxia, paraphasias and poor awareness of errors. FEES with mild oral dysphagia and patient started on Dysphagia 3 with thin liquids. Patient with resultant right hemiparesis, left gaze preference, right facial droop as well as aphasia. PT evaluation done and CIR recommended by MD and rehab team.    Review of Systems  HENT: Negative for hearing loss.   Eyes: Positive for blurred vision.  Respiratory: Positive for shortness of breath (for past few months). Negative for hemoptysis.   Cardiovascular: Negative for chest pain and palpitations.  Gastrointestinal: Negative for heartburn, nausea and abdominal pain.  Genitourinary: Positive for urgency (due to BPH) and frequency.  Musculoskeletal: Negative for myalgias and back pain.  Neurological:  Positive for speech change and focal weakness. Negative for dizziness, tingling and headaches.     Past Medical History  Diagnosis Date  . Diabetes mellitus   . Hypertension   . Hyperlipidemia   . History of colon polyps   . Benign prostatic hypertrophy   . Arthritis   . Ventricular tachycardia     RBB/LAHB ideopathic VT, 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  . Melanoma     under arm  . Osteoarthritis   . Dyspnea on exertion 11/01/2011    Past Surgical History  Procedure Laterality Date  . Replacement total knee    . Vasectomy    . Joint replacement      R TKR  . Replacement total knee Left   . Radiology with anesthesia N/A 05/18/2014    Procedure: RADIOLOGY WITH ANESTHESIA;  Surgeon: Rob Hickman, MD;  Location: [facility];  Service: Radiology;  Laterality: N/A;    Family History  Problem Relation Age of Onset  . Stroke Father     Social History:   Married. Independent PTA. He reports that he has quit smoking. He has never used smokeless tobacco. He reports that he drinks alcohol 3-4 cans of beer. He reports that he does not use illicit drugs.    Allergies  Allergen Reactions  . Ciprofloxacin Anaphylaxis  . Levofloxacin Anaphylaxis    "throat closes"  . Sulfa Antibiotics Rash    Medications Prior to Admission  Medication Sig Dispense Refill  . Ascorbic Acid (VITAMIN C) 500 MG tablet Take 500 mg by mouth daily.      . carvedilol (COREG) 6.25 MG tablet Take 6.25 mg by mouth 2 (two) times daily with  a meal.    . Cholecalciferol (VITAMIN D3) 1000 UNITS CAPS Take 1,000 Units by mouth daily.     . fenofibrate (TRICOR) 145 MG tablet Take 145 mg by mouth daily.      . finasteride (PROSCAR) 5 MG tablet Take 5 mg by mouth daily.      . metFORMIN (GLUCOPHAGE) 500 MG tablet Take 500 mg by mouth 2 (two) times daily with a meal.      . Multiple Vitamin (MULTIVITAMIN WITH MINERALS) TABS tablet Take 1 tablet by mouth daily. One A Day 50+    . Tamsulosin HCl (FLOMAX) 0.4 MG CAPS Take  0.4 mg by mouth daily after breakfast.     . torsemide (DEMADEX) 10 MG tablet Take 10 mg by mouth daily.     . verapamil (CALAN-SR) 180 MG CR tablet Take 180 mg by mouth at bedtime.    . [DISCONTINUED] aspirin EC 81 MG tablet Take 81 mg by mouth daily.    . [DISCONTINUED] verapamil (CALAN-SR) 180 MG CR tablet TAKE ONE TABLET AT BEDTIME (Patient not taking: Reported on 05/18/2014) 30 tablet 3    Home: Home Living Family/patient expects to be discharged to:: Inpatient rehab Available Help at Discharge: Family Type of Home: House  Lives With: Spouse  Functional History:   Functional Status:  Mobility: Bed Mobility Overal bed mobility: Needs Assistance Bed Mobility: Supine to Sit Supine to sit: Mod assist Transfers Overall transfer level: Needs assistance Equipment used: Rolling walker (2 wheeled) Transfers: Sit to/from Stand, [employer] Transfers Sit to Stand: Min assist, +2 physical assistance Stand pivot transfers: Min assist, +2 physical assistance Ambulation/Gait Ambulation/Gait assistance: Min assist, +2 physical assistance Ambulation Distance (Feet): 8 Feet General Gait Details: pivotal steps to chair    ADL:    Cognition: Cognition Overall Cognitive Status: Impaired/Different from baseline Arousal/Alertness: Awake/alert Orientation Level: Oriented X4 Attention: Focused, Sustained, Selective Focused Attention: Appears intact Sustained Attention: Appears intact Selective Attention: Appears intact Memory:  (difficult to asses due to language deficits) Awareness: Impaired Awareness Impairment: Anticipatory impairment Problem Solving: Appears intact Cognition Arousal/Alertness: Awake/alert Behavior During Therapy: WFL for tasks assessed/performed Overall Cognitive Status: Impaired/Different from baseline Area of Impairment: Orientation, Memory, Safety/judgement, Awareness, Problem solving Orientation Level: Disoriented to, Place, Time, Situation Memory: Decreased  short-term memory Safety/Judgement: Decreased awareness of safety Awareness: Intellectual Problem Solving: Slow processing, Decreased initiation, Difficulty sequencing, Requires verbal cues, Requires tactile cues  Blood pressure 151/93, pulse 93, temperature 99.2 F (37.3 C), temperature source Oral, resp. rate 22, height 5\' 8"  (1.727 m), weight 109.3 kg (240 lb 15.4 oz), SpO2 98 %. Physical Exam  Nursing note and vitals reviewed. Constitutional: He is oriented to person, place, and time. He appears well-developed and well-nourished.  HENT:  Head: Normocephalic and atraumatic.  Eyes: Conjunctivae are normal. Pupils are equal, round, and reactive to light.  Neck: Normal range of motion. Neck supple.  Cardiovascular: An irregularly irregular rhythm present. Tachycardia present.   Respiratory: Effort normal. No accessory muscle usage. No respiratory distress. He has decreased breath sounds in the right lower field and the left lower field. He has no wheezes.  GI: Soft. Bowel sounds are normal. He exhibits no distension. There is no tenderness.  Musculoskeletal: He exhibits no edema or tenderness.  Neurological: He is alert and oriented to person, place, and time.  Right facial weakness. Mild dysarthria with slow measured speech. Expressive deficits with occasional paraphrasia's. Able to follow one step commands but breaks down with two step and occasional perseverative  behaviors.  Right inattention with question right field cut. Right hemiparesis with sensory deficits.    Skin: Skin is warm and dry.    Results for orders placed or performed during the hospital encounter of 05/18/14 (from the past 24 hour(s))  Glucose, capillary     Status: Abnormal   Collection Time: 05/20/14 11:39 AM  Result Value Ref Range   Glucose-Capillary 139 (H) 70 - 99 mg/dL   Comment 1 Notify RN    Comment 2 Documented in Chart   Glucose, capillary     Status: Abnormal   Collection Time: 05/20/14  5:19 PM    Result Value Ref Range   Glucose-Capillary 112 (H) 70 - 99 mg/dL   Comment 1 Notify RN    Comment 2 Documented in Chart   Glucose, capillary     Status: Abnormal   Collection Time: 05/20/14  9:46 PM  Result Value Ref Range   Glucose-Capillary 123 (H) 70 - 99 mg/dL  Basic metabolic panel     Status: Abnormal   Collection Time: 05/21/14  1:48 AM  Result Value Ref Range   Sodium 142 137 - 147 mEq/L   Potassium 3.2 (L) 3.7 - 5.3 mEq/L   Chloride 100 96 - 112 mEq/L   CO2 24 19 - 32 mEq/L   Glucose, Bld 125 (H) 70 - 99 mg/dL   BUN 13 6 - 23 mg/dL   Creatinine, Ser 0.65 0.50 - 1.35 mg/dL   Calcium 8.8 8.4 - 10.5 mg/dL   GFR calc non Af Amer >90 >90 mL/min   GFR calc Af Amer >90 >90 mL/min   Anion gap 18 (H) 5 - 15  CBC with Differential     Status: Abnormal   Collection Time: 05/21/14  1:48 AM  Result Value Ref Range   WBC 10.7 (H) 4.0 - 10.5 K/uL   RBC 4.53 4.22 - 5.81 MIL/uL   Hemoglobin 14.8 13.0 - 17.0 g/dL   HCT 43.9 39.0 - 52.0 %   MCV 96.9 78.0 - 100.0 fL   MCH 32.7 26.0 - 34.0 pg   MCHC 33.7 30.0 - 36.0 g/dL   RDW 13.1 11.5 - 15.5 %   Platelets 165 150 - 400 K/uL   Neutrophils Relative % 77 43 - 77 %   Neutro Abs 8.2 (H) 1.7 - 7.7 K/uL   Lymphocytes Relative 12 12 - 46 %   Lymphs Abs 1.3 0.7 - 4.0 K/uL   Monocytes Relative 11 3 - 12 %   Monocytes Absolute 1.2 (H) 0.1 - 1.0 K/uL   Eosinophils Relative 0 0 - 5 %   Eosinophils Absolute 0.0 0.0 - 0.7 K/uL   Basophils Relative 0 0 - 1 %   Basophils Absolute 0.0 0.0 - 0.1 K/uL   Mr Brain Wo Contrast  05/19/2014   CLINICAL DATA:  LEFT MCA infarct status post IV tPA and mechanical revascularization. Embolus secondary to newly diagnosed atrial fibrillation. RIGHT hemiparesis. Initial encounter.  EXAM: MRI HEAD WITHOUT CONTRAST  TECHNIQUE: Multiplanar, multiecho pulse sequences of the brain and surrounding structures were obtained without intravenous contrast.  COMPARISON:  CT head 05/18/2014.  FINDINGS: There is restricted  diffusion in the LEFT basal ganglia, LEFT periventricular white matter, and medial LEFT temporal lobe (uncus) consistent with areas of acute infarction related to LEFT MCA occlusion with subsequent revascularization. No insular, frontal, lateral temporal, or parietal cortical infarction. No RIGHT hemisphere, brainstem or cerebellar acute infarction.  Generalized atrophy. Mild white matter signal abnormality, probable  chronic microvascular ischemic change. No hemorrhagic transformation. Flow voids proximally have been reestablished including LEFT MCA M1 segment. No midline shift. Calvarium intact. Minor sinus fluid.  IMPRESSION: Areas of acute infarction remain following revascularization of the LEFT ICA, ACA, and MCA, involving the medial LEFT temporal lobe uncus and LEFT basal ganglia. No hemorrhagic transformation. No other areas of convexity cortical infarction are observed.  Atrophy and small vessel disease, similar to priors.   Electronically Signed   By: Rolla Flatten M.D.   On: 05/19/2014 15:42   Dg Chest Port 1 View  05/20/2014   CLINICAL DATA:  Pulmonary edema.  Shortness of breath.  EXAM: PORTABLE CHEST - 1 VIEW  COMPARISON:  05/19/2014  FINDINGS: Interval removal of endotracheal tube. Shallow inspiration. Cardiac enlargement with pulmonary vascular congestion and perihilar airspace and interstitial edema. Changes are increasing since prior study. Probable small bilateral pleural effusions. No pneumothorax. Calcified and tortuous aorta.  IMPRESSION: Cardiac enlargement with increasing pulmonary vascular congestion and perihilar edema since previous study. Probable small bilateral pleural effusions.   Electronically Signed   By: Lucienne Capers M.D.   On: 05/20/2014 01:51    Assessment/Plan: Diagnosis: left MCA infarct 1. Does the need for close, 24 hr/day medical supervision in concert with the patient's rehab needs make it unreasonable for this patient to be served in a less intensive setting?  Yes 2. Co-Morbidities requiring supervision/potential complications: dm, htn afib 3. Due to bladder management, bowel management, safety, skin/wound care, disease management, medication administration, pain management and patient education, does the patient require 24 hr/day rehab nursing? Yes 4. Does the patient require coordinated care of a physician, rehab nurse, PT (1-2 hrs/day, 5 days/week), OT (1-2 hrs/day, 5 days/week) and SLP (1-2 hrs/day, 5 days/week) to address physical and functional deficits in the context of the above medical diagnosis(es)? Yes Addressing deficits in the following areas: balance, endurance, locomotion, strength, transferring, bowel/bladder control, bathing, dressing, feeding, grooming, toileting, cognition, speech, language, swallowing and psychosocial support 5. Can the patient actively participate in an intensive therapy program of at least 3 hrs of therapy per day at least 5 days per week? Yes 6. The potential for patient to make measurable gains while on inpatient rehab is excellent 7. Anticipated functional outcomes upon discharge from inpatient rehab are supervision and min assist  with PT, supervision and min assist with OT, supervision, min assist and mod assist with SLP. 8. Estimated rehab length of stay to reach the above functional goals is: 20-25 days 9. Does the patient have adequate social supports to accommodate these discharge functional goals? Yes 10. Anticipated D/C setting: Home 11. Anticipated post D/C treatments: HH therapy and Outpatient therapy 12. Overall Rehab/Functional Prognosis: excellent  RECOMMENDATIONS: This patient's condition is appropriate for continued rehabilitative care in the following setting: CIR Patient has agreed to participate in recommended program. n/a Note that insurance prior authorization may be required for reimbursement for recommended care.  Comment: Rehab Admissions Coordinator to follow up.  Thanks,  Mcgrail Staggers, MD, Mellody Drown     05/21/2014

## 2014-05-21 NOTE — Progress Notes (Signed)
Speech Language Pathology Treatment: Dysphagia;Cognitive-Linquistic  Patient Details Name: Austin Warren MRN: [id number] DOB: [birth year] Today's Date: 05/21/2014 Time: 0836-     Assessment / Plan / Recommendation Clinical Impression  Pt seen with am meal, requires moderate cues to correct oral apraxic and aphasic word finding errors. Pt still particularly struggling with commands for compensatory strategies for swallowing - throat clear or second swallow when oral residue or wet vocal quality noted. Provided visual feedback with mirror for improved awareness.  Increased cueing often results in increased difficulty, though with max cues pt corrected semantic paraphasias x3. Receptive language with phrase and conversation improving. Pt also responding at phrase level with 75% accuracy. Recommend f/u with CIR. Continue diet.    HPI HPI: Austin Warren is an 73 y.o. male who arrived via EMS as a code stroke with left gaze preference, right facial droop, right sided plegia. Patient was administered IV TPA followed by cerebral angio in IR where he was found to have a T occlusion with complete revascularization of Lt ICA terminus, Lt MCA and Lt ACA with 12mg  of IA TPA. MRI shows areas of acute infarction remain following revascularization of the LEFT ICA, ACA, and MCA, involving the medial LEFT temporal lobe uncus and LEFT basal ganglia. CXR shows cardiac enlargement with increasing pulmonary vascular congestion   Pertinent Vitals Pain Assessment: No/denies pain  SLP Plan  Continue with current plan of care    Recommendations Diet recommendations: Dysphagia 3 (mechanical soft);Thin liquid Liquids provided via: Cup;No straw Medication Administration: Whole meds with liquid Supervision: Patient able to self feed;Staff to assist with self feeding;Full supervision/cueing for compensatory strategies Compensations: Slow rate;Small sips/bites;Check for pocketing;Multiple dry swallows after each  bite/sip Postural Changes and/or Swallow Maneuvers: Seated upright 90 degrees;Upright 30-60 min after meal              General recommendations: Rehab consult Oral Care Recommendations: Oral care BID Follow up Recommendations: Inpatient Rehab Plan: Continue with current plan of care    GO    [facility], MA CCC-SLP [phone]  Austin Warren 05/21/2014, 8:51 AM

## 2014-05-21 NOTE — Progress Notes (Signed)
Referring Physician(s): Dr Nicole Kindred  Subjective:  CVA L IVA; MCA; ACA iatpa and clot retrieval 11/3 Doing well Moving Rt side better daily Plan for Rehab transfer soon  Allergies: Ciprofloxacin; Levofloxacin; and Sulfa antibiotics  Medications: Prior to Admission medications   Medication Sig Start Date End Date Taking? Authorizing Provider  Ascorbic Acid (VITAMIN C) 500 MG tablet Take 500 mg by mouth daily.     Yes Historical Provider, MD  carvedilol (COREG) 6.25 MG tablet Take 6.25 mg by mouth 2 (two) times daily with a meal.   Yes Historical Provider, MD  Cholecalciferol (VITAMIN D3) 1000 UNITS CAPS Take 1,000 Units by mouth daily.    Yes Historical Provider, MD  fenofibrate (TRICOR) 145 MG tablet Take 145 mg by mouth daily.     Yes Historical Provider, MD  finasteride (PROSCAR) 5 MG tablet Take 5 mg by mouth daily.     Yes Historical Provider, MD  metFORMIN (GLUCOPHAGE) 500 MG tablet Take 500 mg by mouth 2 (two) times daily with a meal.     Yes Historical Provider, MD  Multiple Vitamin (MULTIVITAMIN WITH MINERALS) TABS tablet Take 1 tablet by mouth daily. One A Day 50+   Yes Historical Provider, MD  Tamsulosin HCl (FLOMAX) 0.4 MG CAPS Take 0.4 mg by mouth daily after breakfast.    Yes Historical Provider, MD  torsemide (DEMADEX) 10 MG tablet Take 10 mg by mouth daily.  02/17/13  Yes Historical Provider, MD  verapamil (CALAN-SR) 180 MG CR tablet Take 180 mg by mouth at bedtime.   Yes Historical Provider, MD    Review of Systems  Vital Signs: BP 151/93 mmHg  Pulse 93  Temp(Src) 97.8 F (36.6 C) (Axillary)  Resp 22  Ht 5\' 8"  (1.727 m)  Wt 109.3 kg (240 lb 15.4 oz)  BMI 36.65 kg/m2  SpO2 98%  Physical Exam  Constitutional: He appears well-developed and well-nourished.  A/O Appropriate Slight Rt sided facial droop Sluggish slightly--sleepy   Musculoskeletal:  Lt arm and leg with good movement and sensation Good strength Moves to command well  Rt arm leg both move  to command Much weaker than left---but better     Imaging: Ct Head Wo Contrast  05/18/2014   CLINICAL DATA:  Followup evaluation status post interventional procedure  EXAM: CT HEAD WITHOUT CONTRAST  TECHNIQUE: Contiguous axial images were obtained from the base of the skull through the vertex without intravenous contrast.  COMPARISON:  CT brain 06/07/2014  FINDINGS: Ventricles and sulci are prominent, compatible with atrophy. No definite evidence for acute intracranial hemorrhage, mass lesion or significant mass effect. Increased density along the falx and tentorium favored to represent sequelae of contrast administration. Minimal hypodensity within the left basal ganglia region however relative preservation of the gray-white differentiation within the left MCA territory. Orbits are unremarkable. Ethmoid sinus mucosal thickening. Mastoid air cells are unremarkable. Calvarium is intact.  IMPRESSION: Suggestion of minimal hypodensity within the left basal ganglia region possibly representing age indeterminate ischemic change. Overall however, there is relative preservation of the gray-white differentiation within the left MCA territory without suggestion of a large infarct visible at this time.  Increased density along the falx and tentorium is favored to represent sequelae of contrast administration.   Electronically Signed   By: Lovey Newcomer M.D.   On: 05/18/2014 19:37   Ct Head Wo Contrast  05/18/2014   CLINICAL DATA:  Code stroke.  Right-sided weakness.  Blurred vision  EXAM: CT HEAD WITHOUT CONTRAST  TECHNIQUE: Contiguous  axial images were obtained from the base of the skull through the vertex without intravenous contrast.  COMPARISON:  CT head 09/04/2013  FINDINGS: Generalized atrophy. Mild chronic microvascular ischemic change in the periventricular white matter.  Negative for acute infarct. Negative for hemorrhage or mass. Calvarium intact.  Increased density in the left terminal internal carotid  artery and left MCA most compatible with thrombosis. No evidence of acute infarct.  IMPRESSION: Atrophy and minimal chronic microvascular ischemia. No acute abnormality.  Dense left MCA compatible with thrombosis.  Critical Value/emergent results were called by telephone at the time of interpretation on 05/18/2014 at 3:34 pm to Dr. Wallie Char , who verbally acknowledged these results.   Electronically Signed   By: Franchot Gallo M.D.   On: 05/18/2014 15:36   Mr Brain Wo Contrast  05/19/2014   CLINICAL DATA:  LEFT MCA infarct status post IV tPA and mechanical revascularization. Embolus secondary to newly diagnosed atrial fibrillation. RIGHT hemiparesis. Initial encounter.  EXAM: MRI HEAD WITHOUT CONTRAST  TECHNIQUE: Multiplanar, multiecho pulse sequences of the brain and surrounding structures were obtained without intravenous contrast.  COMPARISON:  CT head 05/18/2014.  FINDINGS: There is restricted diffusion in the LEFT basal ganglia, LEFT periventricular white matter, and medial LEFT temporal lobe (uncus) consistent with areas of acute infarction related to LEFT MCA occlusion with subsequent revascularization. No insular, frontal, lateral temporal, or parietal cortical infarction. No RIGHT hemisphere, brainstem or cerebellar acute infarction.  Generalized atrophy. Mild white matter signal abnormality, probable chronic microvascular ischemic change. No hemorrhagic transformation. Flow voids proximally have been reestablished including LEFT MCA M1 segment. No midline shift. Calvarium intact. Minor sinus fluid.  IMPRESSION: Areas of acute infarction remain following revascularization of the LEFT ICA, ACA, and MCA, involving the medial LEFT temporal lobe uncus and LEFT basal ganglia. No hemorrhagic transformation. No other areas of convexity cortical infarction are observed.  Atrophy and small vessel disease, similar to priors.   Electronically Signed   By: Rolla Flatten M.D.   On: 05/19/2014 15:42   Dg Chest  Port 1 View  05/20/2014   CLINICAL DATA:  Pulmonary edema.  Shortness of breath.  EXAM: PORTABLE CHEST - 1 VIEW  COMPARISON:  05/19/2014  FINDINGS: Interval removal of endotracheal tube. Shallow inspiration. Cardiac enlargement with pulmonary vascular congestion and perihilar airspace and interstitial edema. Changes are increasing since prior study. Probable small bilateral pleural effusions. No pneumothorax. Calcified and tortuous aorta.  IMPRESSION: Cardiac enlargement with increasing pulmonary vascular congestion and perihilar edema since previous study. Probable small bilateral pleural effusions.   Electronically Signed   By: Lucienne Capers M.D.   On: 05/20/2014 01:51   Portable Chest Xray  05/19/2014   CLINICAL DATA:  Intubated patient  EXAM: PORTABLE CHEST - 1 VIEW  COMPARISON:  Chest x-ray dated November 12, 2010  FINDINGS: The lungs are mildly hypoinflated. There is increased density in the left upper lobe. The cardiac silhouette is enlarged. The pulmonary vascularity is not engorged. There is no pleural effusion or pneumothorax. The endotracheal tube tip lies approximately 3.5 cm above the crotch of the carina. The observed bony thorax is unremarkable.  IMPRESSION: Left upper lobe infiltrate or atelectasis. Cardiomegaly without evidence of pulmonary edema.   Electronically Signed   By: David  [place]   On: 05/19/2014 08:30    Labs:  CBC:  Recent Labs  05/18/14 1504 05/18/14 1516 05/19/14 0510 05/20/14 0235 05/21/14 0148  WBC 6.2  --  8.6 9.4 10.7*  HGB 14.8 15.6 13.4  14.3 14.8  HCT 43.7 46.0 39.9 43.3 43.9  PLT 167  --  135* 144* 165    COAGS:  Recent Labs  05/18/14 1504  INR 1.11  APTT 30    BMP:  Recent Labs  05/18/14 1504 05/18/14 1516 05/19/14 0510 05/20/14 0235 05/21/14 0148  NA 139 138 137 141 142  K 4.3 3.9 3.8 3.6* 3.2*  CL 101 102 102 105 100  CO2 24  --  23 23 24   GLUCOSE 115* 117* 146* 135* 125*  BUN 18 18 11 8 13   CALCIUM 9.4  --  7.9* 8.2* 8.8    CREATININE 0.87 1.00 0.60 0.64 0.65  GFRNONAA 82*  --  >90 >90 >90  GFRAA >90  --  >90 >90 >90    LIVER FUNCTION TESTS:  Recent Labs  05/18/14 1504  BILITOT 0.5  AST 38*  ALT 41  ALKPHOS 44  PROT 6.9  ALBUMIN 3.9    Assessment and Plan:  CVA L ICA;MCA;ACA ia tpa and clot retrieval 11/3 in IR with Dr Estanislado Pandy Better daily Plan for Rehab transfer soon   I spent a total of 15 minutes face to face in clinical consultation/evaluation, greater than 50% of which was counseling/coordinating care for CVA; ia tpa/clot retrieval 11/3  Signed: TURPIN,PAMELA A 05/21/2014, 9:35 AM

## 2014-05-21 NOTE — Progress Notes (Signed)
Transfer order received. Report given to Manus Gunning, Therapist, sports. Patient transferred to room 4N15 via wheelchair with RN with no difficulties. Wife at bedside updated of patients new room. Selisa Tensley, [place] Marie, RN 05/21/2014 1450

## 2014-05-22 LAB — URINE MICROSCOPIC-ADD ON

## 2014-05-22 LAB — URINALYSIS, ROUTINE W REFLEX MICROSCOPIC
BILIRUBIN URINE: NEGATIVE
GLUCOSE, UA: NEGATIVE mg/dL
Ketones, ur: 15 mg/dL — AB
Nitrite: NEGATIVE
PH: 5.5 (ref 5.0–8.0)
Protein, ur: 30 mg/dL — AB
Specific Gravity, Urine: 1.02 (ref 1.005–1.030)
Urobilinogen, UA: 1 mg/dL (ref 0.0–1.0)

## 2014-05-22 LAB — GLUCOSE, CAPILLARY
GLUCOSE-CAPILLARY: 129 mg/dL — AB (ref 70–99)
Glucose-Capillary: 120 mg/dL — ABNORMAL HIGH (ref 70–99)
Glucose-Capillary: 126 mg/dL — ABNORMAL HIGH (ref 70–99)

## 2014-05-22 MED ORDER — POTASSIUM CHLORIDE CRYS ER 20 MEQ PO TBCR
20.0000 meq | EXTENDED_RELEASE_TABLET | Freq: Every day | ORAL | Status: DC
  Administered 2014-05-22 – 2014-05-25 (×4): 20 meq via ORAL
  Filled 2014-05-22 (×4): qty 1

## 2014-05-22 NOTE — Progress Notes (Signed)
STROKE TEAM PROGRESS NOTE   HISTORY Austin Warren is an 73 y.o. male who was normal this AM 05/18/2014. He went to the bank at 12:30 and noted he had blurred vision. When he came home he mentioned this to his wife and she called his PCP. They were told to take his BP. As they were taking his BP he noted he could not get up from the chair and was unable to move his right side. EMS was called and patient was brought to ED as a code stroke. On arrival he showed a left gaze gaze preference, right facial droop, right sided plegia. Initial CT head showed no acute infarct but did demonstrate a large dense left MCA sign. IR was contacted. IV tPA was started and patient was brought back to IR for cerebral angiogram. NIH stroke score was 23. EKG showed atrial fibrillation, which had not previously been known. Patient was administered IV TPA followed by cerebral angio in IR where he was found to have a T occlusion with complete revascularization of Lt ICA terminus, Lt MCA and Lt ACA with 12mg  of IA TPA, 1 pass with Trevoprovue, and 1 pass with Solitaire withTICI 3 revascularization. He was admitted to the neuro ICU for further evaluation and treatment.   SUBJECTIVE (INTERVAL HISTORY) The patient's wife was at the bedside. The patient has been experiencing urinary retention. He has a history of BPH.    OBJECTIVE Temp:  [97.4 F (36.3 C)-98.8 F (37.1 C)] 98.3 F (36.8 C) (11/07 0558) Pulse Rate:  [55-107] 73 (11/07 0558) Cardiac Rhythm:  [-] Atrial fibrillation;Atrial flutter (11/06 2000) Resp:  [16-24] 18 (11/07 0558) BP: (123-157)/(67-97) 138/91 mmHg (11/07 0558) SpO2:  [94 %-100 %] 97 % (11/07 0558)   Recent Labs Lab 05/21/14 0834 05/21/14 1120 05/21/14 1718 05/21/14 2147 05/22/14 0601  GLUCAP 160* 148* 153* 110* 120*    Recent Labs Lab 05/18/14 1504 05/18/14 1516 05/19/14 0510 05/19/14 1100 05/20/14 0235 05/20/14 0239 05/21/14 0148  NA 139 138 137  --  141  --  142  K  4.3 3.9 3.8  --  3.6*  --  3.2*  CL 101 102 102  --  105  --  100  CO2 24  --  23  --  23  --  24  GLUCOSE 115* 117* 146*  --  135*  --  125*  BUN 18 18 11   --  8  --  13  CREATININE 0.87 1.00 0.60  --  0.64  --  0.65  CALCIUM 9.4  --  7.9*  --  8.2*  --  8.8  MG  --   --   --  1.8  --  1.8  --   PHOS  --   --   --   --   --  2.7  --     Recent Labs Lab 05/18/14 1504  AST 38*  ALT 41  ALKPHOS 44  BILITOT 0.5  PROT 6.9  ALBUMIN 3.9    Recent Labs Lab 05/18/14 1504 05/18/14 1516 05/19/14 0510 05/20/14 0235 05/21/14 0148  WBC 6.2  --  8.6 9.4 10.7*  NEUTROABS 4.2  --  7.1 7.6 8.2*  HGB 14.8 15.6 13.4 14.3 14.8  HCT 43.7 46.0 39.9 43.3 43.9  MCV 96.0  --  95.7 99.1 96.9  PLT 167  --  135* 144* 165   No results for input(s): CKTOTAL, CKMB, CKMBINDEX, TROPONINI in the last 168 hours. No results for input(s):  LABPROT, INR in the last 72 hours. No results for input(s): COLORURINE, LABSPEC, Rake, GLUCOSEU, HGBUR, BILIRUBINUR, KETONESUR, PROTEINUR, UROBILINOGEN, NITRITE, LEUKOCYTESUR in the last 72 hours.  Invalid input(s): APPERANCEUR     Component Value Date/Time   CHOL 124 05/19/2014 0510   TRIG 142 05/19/2014 0510   HDL 38* 05/19/2014 0510   CHOLHDL 3.3 05/19/2014 0510   VLDL 28 05/19/2014 0510   LDLCALC 58 05/19/2014 0510   Lab Results  Component Value Date   HGBA1C 6.3* 05/19/2014      Component Value Date/Time   LABOPIA NONE DETECTED 05/19/2014 0605   COCAINSCRNUR NONE DETECTED 05/19/2014 0605   LABBENZ NONE DETECTED 05/19/2014 0605   AMPHETMU NONE DETECTED 05/19/2014 0605   THCU NONE DETECTED 05/19/2014 0605   LABBARB NONE DETECTED 05/19/2014 0605     Recent Labs Lab 05/18/14 1504  ETH <11    Ct Head Wo Contrast 05/18/2014  Suggestion of minimal hypodensity within the left basal ganglia region possibly representing age indeterminate ischemic change. Overall however, there is relative preservation of the gray-white differentiation within the  left MCA territory without suggestion of a large infarct visible at this time. Increased density along the falx and tentorium is favored to represent sequelae of contrast administration.  05/18/2014  Atrophy and minimal chronic microvascular ischemia. No acute abnormality. Dense left MCA compatible with thrombosis.   Cerebral Angio  05/18/2014  S/P bilateral common carotid arteriograms , followed by complete revascularization of T occlusion of Lt ICA terminus ,Lt MCA and Lt ACA with 12mg  of superselective IA TPA , and xi pass with Trevoprovue 4 x 30 mm ,and x 1 pass with Solitaire FR 4 mm x 40 mm with TICI 3 revascularization  MRI head Areas of acute infarction remain following revascularization of the LEFT ICA, ACA, and MCA, involving the medial LEFT temporal lobe uncus and LEFT basal ganglia. No hemorrhagic transformation. No other areas of convexity cortical infarction are observed. Atrophy and small vessel disease, similar to priors.  2D echo - Left ventricle: The cavity size was mildly dilated. Wall thickness was increased in a pattern of moderate LVH. There was mild concentric hypertrophy. Systolic function was mildly reduced. The estimated ejection fraction was in the range of 45% to 50%. There is hypokinesis of the inferior and apical myocardium. Doppler parameters are consistent with high ventricular filling pressure. - Mitral valve: Calcified annulus. Mildly thickened leaflets . There was mild regurgitation. - Left atrium: The atrium was mildly dilated. - Right ventricle: The cavity size was at the upper limits of normal. Wall thickness was mildly increased. - Right atrium: The atrium was mildly dilated. - Pulmonary arteries: Systolic pressure was mildly to moderately increased. PA peak pressure: 47 mm Hg (S).  PHYSICAL EXAM  Temp:  [97.4 F (36.3 C)-98.8 F (37.1 C)] 98.3 F (36.8 C) (11/07 0558) Pulse Rate:  [55-107] 73 (11/07 0558) Resp:   [16-24] 18 (11/07 0558) BP: (123-157)/(67-97) 138/91 mmHg (11/07 0558) SpO2:  [94 %-100 %] 97 % (11/07 0558)  General - Well nourished, well developed, in no apparent distress.  Ophthalmologic - not able to see through.  Cardiovascular - Irregularly irregular rate and rhythm   Mental Status -  Level of arousal and orientation to place, and person were intact, but not to time. Language including mild expressive aphasia and difficulty with naming but intact repetition and comprehension.  Cranial Nerves II - XII - II - Visual field intact OU. III, IV, VI - Extraocular movements intact. V -  Facial sensation intact bilaterally. VII - right facial droop. VIII - Hearing & vestibular intact bilaterally. X - Palate elevates symmetrically. XI - Chin turning & shoulder shrug intact bilaterally. XII - Tongue protrusion intact.  Motor Strength - The patient's strength was right UE 4-/5 with drift and right LE 4/5 with mild drift, LUE and LLE 5/5.  Bulk was normal and fasciculations were absent.   Motor Tone - Muscle tone was assessed at the neck and appendages and was normal.  Reflexes - The patient's reflexes were decreased in all extremities and he had right babinski.  Sensory - Light touch, temperature/pinprick were assessed and were normal.    Coordination - The patient had normal movements in the hands with no ataxia or dysmetria.  Tremor was absent.  Gait and Station - not tested.   ASSESSMENT/PLAN Austin Warren is a 73 y.o. male with PMH of DM, HTN, HLD, VT, BPH and new-onset atrial fibrillation presenting with left gaze preference, right facial droop, right sided plegia. He did receive IV t-PA 05/18/2014 at 1536. He was then taken to IR for a bilateral cerebral angio where he was found to have a T occlusion with complete revascularization of Lt ICA terminus, Lt MCA and Lt ACA with 12mg  of IA TPA, 1 pass with Trevoprovue, and 1 pass with Solitaire with resultantTICI 3  revascularization.  Stroke: Dominant left MCA infarct s/p IV tPA, IA tPA and mechanical revasculaization of T occlusion. Infarct embolic secondary to newly diagnosed atrial fibrillation  S/P bilateral common carotid arteriograms , followed by  Resultant right facial droop, dysarthria and right hemiparesis  MRI- acute infarction remain following revascularization of the LEFT ICA, ACA, and MCA, involving the medial LEFT temporal lobe uncus and LEFT basal ganglia. No hemorrhagic transformation  Canceled MRA and Carotid Doppler given cerebral angio  2D Echo ejection fraction was in the range of 45 to 50%.   Heparin subq for VTE prophylaxis  DIET DYS 3   aspirin 81 mg orally every day prior to admission, now on ASA 325mg   Due to new onset afib, will need NOAC. Will start NOAC on Monday to avoid high risk of hemorrhagic transformation.  Ongoing aggressive risk factor management  Therapy recommendations:  CIR consult  Disposition:  CIR pending for bed   New onset afib - on ASA 325mg  - start NOAC on Monday to avoid risk of hemorrhagic transformation - tele monitor - cardiology on board - resume coreg, verapamil, and torsemide  BPH - had urinary retention - put on special foley catheter - remove today - watch for urinary retention - resume tamsulosin and proscar - patient required coud catheter today for urinary retention. 565 mL were obtained. Foley left in place for now. - urinalysis pending  Hypertension  Home meds:   Verapamil, coreg  Stable  Resume home meds  Permissive hypertension for 24-48 hours and then gradually normalized in 5-7 days.  Hyperlipidemia  Home meds:  Tricor, order resumed in hospital, however pt unable to swallow at this time  LDL 58, at goal < 70  On lipitor 20mg   Continue statin at discharge  Diabetes  Home meds:  Glucophage  HgbA1c 6.3 on the  goal < 6.5  SSI  Other Stroke Risk Factors Advanced age ETOH use . Obesity,  Body mass index is 36.65 kg/(m^2).  Marland Kitchen Family hx stroke (father)  Other Active Problems  Ventricular tachycardia - resumed verapamil  Other Pertinent History  Hx melanoma  Hospital day # 4  Mikey Bussing PA-C [facility] Pager [phone] 05/22/2014, 8:55 AM     To contact Stroke Continuity provider, please refer to [email or web address] After hours, contact General Neurology

## 2014-05-22 NOTE — Plan of Care (Signed)
Problem: tPA Day Progression Outcomes-Only if tPA administered Goal: Other tPA Day Goals/Outcomes Outcome: Completed/Met Date Met:  05/22/14

## 2014-05-22 NOTE — Progress Notes (Signed)
Patient reported to have not been urinating much, bladder scanned patient, 565 ml obtained. Tried in and out and was not successful. MD notified.

## 2014-05-22 NOTE — Plan of Care (Signed)
Problem: Progression Outcomes Goal: Communication method established Outcome: Completed/Met Date Met:  05/22/14

## 2014-05-22 NOTE — Plan of Care (Signed)
Problem: Progression Outcomes Goal: Tolerating diet/TF at goal rate Outcome: Completed/Met Date Met:  05/22/14     

## 2014-05-22 NOTE — Plan of Care (Signed)
Problem: Progression Outcomes Goal: Pain controlled Outcome: Completed/Met Date Met:  05/22/14

## 2014-05-22 NOTE — Progress Notes (Signed)
Primary cardiologist: Dr. Virl Axe  Seen for followup: Cardiomyopathy, history of VT, atrial fibrillation  Subjective:    Right sided weakness persists. No chest pain or palpitations.  Objective:   Temp:  [97.4 F (36.3 C)-98.8 F (37.1 C)] 98.3 F (36.8 C) (11/07 0558) Pulse Rate:  [55-107] 73 (11/07 0558) Resp:  [16-24] 18 (11/07 0558) BP: (123-157)/(67-97) 138/91 mmHg (11/07 0558) SpO2:  [94 %-100 %] 97 % (11/07 0558) Last BM Date: 05/21/14  Filed Weights   05/18/14 1500 05/18/14 1525 05/18/14 2000  Weight: 240 lb 8.4 oz (109.1 kg) 235 lb 14.3 oz (107 kg) 240 lb 15.4 oz (109.3 kg)    Intake/Output Summary (Last 24 hours) at 05/22/14 0759 Last data filed at 05/21/14 1200  Gross per 24 hour  Intake    120 ml  Output    650 ml  Net   -530 ml    Telemetry: Atrial fibrillation with PVCs versus aberration.  Exam:  General: Obese, no distress.  Lungs: Clear, nonlabored.  Cardiac: Irregularly irregular.  Extremities: Trace edema.   Lab Results:  Basic Metabolic Panel:  Recent Labs Lab 05/19/14 0510 05/19/14 1100 05/20/14 0235 05/20/14 0239 05/21/14 0148  NA 137  --  141  --  142  K 3.8  --  3.6*  --  3.2*  CL 102  --  105  --  100  CO2 23  --  23  --  24  GLUCOSE 146*  --  135*  --  125*  BUN 11  --  8  --  13  CREATININE 0.60  --  0.64  --  0.65  CALCIUM 7.9*  --  8.2*  --  8.8  MG  --  1.8  --  1.8  --     Liver Function Tests:  Recent Labs Lab 05/18/14 1504  AST 38*  ALT 41  ALKPHOS 44  BILITOT 0.5  PROT 6.9  ALBUMIN 3.9    CBC:  Recent Labs Lab 05/19/14 0510 05/20/14 0235 05/21/14 0148  WBC 8.6 9.4 10.7*  HGB 13.4 14.3 14.8  HCT 39.9 43.3 43.9  MCV 95.7 99.1 96.9  PLT 135* 144* 165    Coagulation:  Recent Labs Lab 05/18/14 1504  INR 1.11     Medications:   Scheduled Medications: .  stroke: mapping our early stages of recovery book   Does not apply Once  . aspirin EC  325 mg Oral Daily  . atorvastatin   20 mg Oral q1800  . carvedilol  6.25 mg Oral BID WC  . finasteride  5 mg Oral Daily  . heparin subcutaneous  5,000 Units Subcutaneous 3 times per day  . insulin aspart  0-15 Units Subcutaneous TID WC  . metFORMIN  500 mg Oral BID WC  . multivitamin with minerals  1 tablet Oral Daily  . pantoprazole  40 mg Oral Daily  . tamsulosin  0.4 mg Oral Daily  . torsemide  10 mg Oral Daily  . verapamil  180 mg Oral QHS    Infusions: . sodium chloride      PRN Medications: labetalol, levalbuterol, ondansetron (ZOFRAN) IV, senna-docusate   Assessment:   1. New onset atrial fibrillation, rate controlled. Starting NOAC on Monday per Neurology.  2. History of verapamil sensitive VT - actually on both beta blocker and calcium channel blocker per Dr. Olin Pia last note August 2015.  3. Cardiomyopathy, LVEF 45-50% with described wall motion abnormalities by recent echocardiogram. Previously normal LVEF  and normal coronaries (2012). No further ischemic testing right now. Troponin I negative.  4. LDL 58 on statin.  5. Left MCA infarct.   Plan/Discussion:    Continue Coreg, Verapamil, Lipitor, Demadex. Needs potassium supplement. Change from ASA to NOAC on Monday. Consider ACE-I or ARB later if blood pressure allows.   Satira Sark, M.D., F.A.C.C.

## 2014-05-22 NOTE — Plan of Care (Signed)
Problem: Progression Outcomes Goal: Progressive activity as tolerated Outcome: Completed/Met Date Met:  05/22/14

## 2014-05-22 NOTE — Progress Notes (Signed)
Pt was due to void at 1900. Unable to void. Bladder scanned 346ml. Was about to do in and out cath when we found patient to have dried blood around penis and moderate amount of bruising on scrotum. On call was paged and was given orders to in and out cath. We were unable to get urine return due to enlarged prostate. Shortly after, patient had an incontinent episode and also passed quarter sized clots. Pt is also currently still bleeding a small amount through urethra. MD was made aware. No new further orders. Will hold putting in caude cath until reassesment tomorrow.

## 2014-05-22 NOTE — Plan of Care (Signed)
Problem: tPA Day Progression Outcomes-Only if tPA administered Goal: Post tPA neurologically at baseline or improved CRITERIA FOR NEUROLOGICALLY AT BASELINE OR IMPROVED: - NO S/S OF INC. ICP - AWAKE, ALERT, ORIENTED X3 - SPEECH CLEAR, APPROPRIATE - PERRL - EOMS, BLINK INTACT - FACE SYMMETRICAL - TONGUE/TRACH MIDLINE - GRIPS, PUSH/PULL EQUAL - NO PRONATOR DRIFT - DORSIPLANTAR FLEXION EQUAL - MOVES ALL EXTREMITIES - SENSATION INTACT - NO NUCHAL RIGIDITY OR PHOTOPHOBIA  Outcome: Completed/Met Date Met:  05/22/14     

## 2014-05-23 LAB — BASIC METABOLIC PANEL
Anion gap: 17 — ABNORMAL HIGH (ref 5–15)
BUN: 22 mg/dL (ref 6–23)
CALCIUM: 9 mg/dL (ref 8.4–10.5)
CO2: 21 mEq/L (ref 19–32)
Chloride: 102 mEq/L (ref 96–112)
Creatinine, Ser: 0.62 mg/dL (ref 0.50–1.35)
Glucose, Bld: 120 mg/dL — ABNORMAL HIGH (ref 70–99)
Potassium: 3.8 mEq/L (ref 3.7–5.3)
Sodium: 140 mEq/L (ref 137–147)

## 2014-05-23 LAB — GLUCOSE, CAPILLARY
GLUCOSE-CAPILLARY: 103 mg/dL — AB (ref 70–99)
Glucose-Capillary: 104 mg/dL — ABNORMAL HIGH (ref 70–99)
Glucose-Capillary: 119 mg/dL — ABNORMAL HIGH (ref 70–99)
Glucose-Capillary: 120 mg/dL — ABNORMAL HIGH (ref 70–99)
Glucose-Capillary: 107 mg/dL — ABNORMAL HIGH (ref 70–99)

## 2014-05-23 MED ORDER — TORSEMIDE 10 MG PO TABS
10.0000 mg | ORAL_TABLET | Freq: Every day | ORAL | Status: DC
  Administered 2014-05-23 – 2014-05-25 (×3): 10 mg via ORAL
  Filled 2014-05-23 (×4): qty 1

## 2014-05-23 NOTE — Progress Notes (Signed)
STROKE TEAM PROGRESS NOTE   HISTORY ZADOK HOLAWAY is an 73 y.o. male who was normal this AM 05/18/2014. He went to the bank at 12:30 and noted he had blurred vision. When he came home he mentioned this to his wife and she called his PCP. They were told to take his BP. As they were taking his BP he noted he could not get up from the chair and was unable to move his right side. EMS was called and patient was brought to ED as a code stroke. On arrival he showed a left gaze gaze preference, right facial droop, right sided plegia. Initial CT head showed no acute infarct but did demonstrate a large dense left MCA sign. IR was contacted. IV tPA was started and patient was brought back to IR for cerebral angiogram. NIH stroke score was 23. EKG showed atrial fibrillation, which had not previously been known. Patient was administered IV TPA followed by cerebral angio in IR where he was found to have a T occlusion with complete revascularization of Lt ICA terminus, Lt MCA and Lt ACA with 12mg  of IA TPA, 1 pass with Trevoprovue, and 1 pass with Solitaire withTICI 3 revascularization. He was admitted to the neuro ICU for further evaluation and treatment.   SUBJECTIVE (INTERVAL HISTORY) The patient's wife was at the bedside. The nursing staff was able to use a coud catheter yesterday to reinsert the Foley. His nurse reports that he still has some blood oozing from the urethra. Urinalysis sent. The patient has a urologist in [place]; however, he may need a urology consult here. The plan is for inpatient rehabilitation - possibly Monday.   OBJECTIVE Temp:  [97.4 F (36.3 C)-98.6 F (37 C)] 98.5 F (36.9 C) (11/08 1408) Pulse Rate:  [51-85] 79 (11/08 1408) Cardiac Rhythm:  [-] Atrial fibrillation;Atrial flutter (11/08 0800) Resp:  [20] 20 (11/08 1408) BP: (109-143)/(52-84) 109/52 mmHg (11/08 1408) SpO2:  [95 %-100 %] 100 % (11/08 1408)   Recent Labs Lab 05/22/14 1159 05/22/14 1628  05/22/14 2143 05/23/14 0601 05/23/14 1146  GLUCAP 129* 126* 104* 119* 120*    Recent Labs Lab 05/18/14 1504 05/18/14 1516 05/19/14 0510 05/19/14 1100 05/20/14 0235 05/20/14 0239 05/21/14 0148 05/23/14 0625  NA 139 138 137  --  141  --  142 140  K 4.3 3.9 3.8  --  3.6*  --  3.2* 3.8  CL 101 102 102  --  105  --  100 102  CO2 24  --  23  --  23  --  24 21  GLUCOSE 115* 117* 146*  --  135*  --  125* 120*  BUN 18 18 11   --  8  --  13 22  CREATININE 0.87 1.00 0.60  --  0.64  --  0.65 0.62  CALCIUM 9.4  --  7.9*  --  8.2*  --  8.8 9.0  MG  --   --   --  1.8  --  1.8  --   --   PHOS  --   --   --   --   --  2.7  --   --     Recent Labs Lab 05/18/14 1504  AST 38*  ALT 41  ALKPHOS 44  BILITOT 0.5  PROT 6.9  ALBUMIN 3.9    Recent Labs Lab 05/18/14 1504 05/18/14 1516 05/19/14 0510 05/20/14 0235 05/21/14 0148  WBC 6.2  --  8.6 9.4 10.7*  NEUTROABS 4.2  --  7.1 7.6 8.2*  HGB 14.8 15.6 13.4 14.3 14.8  HCT 43.7 46.0 39.9 43.3 43.9  MCV 96.0  --  95.7 99.1 96.9  PLT 167  --  135* 144* 165   No results for input(s): CKTOTAL, CKMB, CKMBINDEX, TROPONINI in the last 168 hours. No results for input(s): LABPROT, INR in the last 72 hours.  Recent Labs  05/22/14 1908  COLORURINE AMBER*  LABSPEC 1.020  PHURINE 5.5  GLUCOSEU NEGATIVE  HGBUR LARGE*  BILIRUBINUR NEGATIVE  KETONESUR 15*  PROTEINUR 30*  UROBILINOGEN 1.0  NITRITE NEGATIVE  LEUKOCYTESUR SMALL*       Component Value Date/Time   CHOL 124 05/19/2014 0510   TRIG 142 05/19/2014 0510   HDL 38* 05/19/2014 0510   CHOLHDL 3.3 05/19/2014 0510   VLDL 28 05/19/2014 0510   LDLCALC 58 05/19/2014 0510   Lab Results  Component Value Date   HGBA1C 6.3* 05/19/2014      Component Value Date/Time   LABOPIA NONE DETECTED 05/19/2014 0605   COCAINSCRNUR NONE DETECTED 05/19/2014 0605   LABBENZ NONE DETECTED 05/19/2014 0605   AMPHETMU NONE DETECTED 05/19/2014 0605   THCU NONE DETECTED 05/19/2014 0605   LABBARB NONE  DETECTED 05/19/2014 0605     Recent Labs Lab 05/18/14 1504  ETH <11    Ct Head Wo Contrast 05/18/2014  Suggestion of minimal hypodensity within the left basal ganglia region possibly representing age indeterminate ischemic change. Overall however, there is relative preservation of the gray-white differentiation within the left MCA territory without suggestion of a large infarct visible at this time. Increased density along the falx and tentorium is favored to represent sequelae of contrast administration.  05/18/2014  Atrophy and minimal chronic microvascular ischemia. No acute abnormality. Dense left MCA compatible with thrombosis.   Cerebral Angio  05/18/2014  S/P bilateral common carotid arteriograms , followed by complete revascularization of T occlusion of Lt ICA terminus ,Lt MCA and Lt ACA with 12mg  of superselective IA TPA , and xi pass with Trevoprovue 4 x 30 mm ,and x 1 pass with Solitaire FR 4 mm x 40 mm with TICI 3 revascularization  MRI head Areas of acute infarction remain following revascularization of the LEFT ICA, ACA, and MCA, involving the medial LEFT temporal lobe uncus and LEFT basal ganglia. No hemorrhagic transformation. No other areas of convexity cortical infarction are observed. Atrophy and small vessel disease, similar to priors.  2D echo - Left ventricle: The cavity size was mildly dilated. Wall thickness was increased in a pattern of moderate LVH. There was mild concentric hypertrophy. Systolic function was mildly reduced. The estimated ejection fraction was in the range of 45% to 50%. There is hypokinesis of the inferior and apical myocardium. Doppler parameters are consistent with high ventricular filling pressure. - Mitral valve: Calcified annulus. Mildly thickened leaflets . There was mild regurgitation. - Left atrium: The atrium was mildly dilated. - Right ventricle: The cavity size was at the upper limits of normal.  Wall thickness was mildly increased. - Right atrium: The atrium was mildly dilated. - Pulmonary arteries: Systolic pressure was mildly to moderately increased. PA peak pressure: 47 mm Hg (S).  PHYSICAL EXAM  Temp:  [97.4 F (36.3 C)-98.6 F (37 C)] 98.5 F (36.9 C) (11/08 1408) Pulse Rate:  [51-85] 79 (11/08 1408) Resp:  [20] 20 (11/08 1408) BP: (109-143)/(52-84) 109/52 mmHg (11/08 1408) SpO2:  [95 %-100 %] 100 % (11/08 1408)  General - Well nourished, well developed, in no apparent distress.  Ophthalmologic -  not able to see through.  Cardiovascular - Irregularly irregular rate and rhythm   Mental Status -  Level of arousal and orientation to place, and person were intact, but not to time. Language including mild expressive aphasia and difficulty with naming but intact repetition and comprehension.  Cranial Nerves II - XII - II - Visual field intact OU. III, IV, VI - Extraocular movements intact. V - Facial sensation intact bilaterally. VII - right facial droop. VIII - Hearing & vestibular intact bilaterally. X - Palate elevates symmetrically. XI - Chin turning & shoulder shrug intact bilaterally. XII - Tongue protrusion intact.  Motor Strength - The patient's strength was right UE 4-/5 with drift and right LE 4/5 with mild drift, LUE and LLE 5/5.  Bulk was normal and fasciculations were absent.   Motor Tone - Muscle tone was assessed at the neck and appendages and was normal.  Reflexes - The patient's reflexes were decreased in all extremities and he had right babinski.  Sensory - Light touch, temperature/pinprick were assessed and were normal.    Coordination - The patient had normal movements in the hands with no ataxia or dysmetria.  Tremor was absent.  Gait and Station - not tested.   ASSESSMENT/PLAN Mr. BRAELYN BORDONARO is a 73 y.o. male with PMH of DM, HTN, HLD, VT, BPH and new-onset atrial fibrillation presenting with left gaze preference, right facial  droop, right sided plegia. He did receive IV t-PA 05/18/2014 at 1536. He was then taken to IR for a bilateral cerebral angio where he was found to have a T occlusion with complete revascularization of Lt ICA terminus, Lt MCA and Lt ACA with 12mg  of IA TPA, 1 pass with Trevoprovue, and 1 pass with Solitaire with resultantTICI 3 revascularization.  Stroke: Dominant left MCA infarct s/p IV tPA, IA tPA and mechanical revasculaization of T occlusion. Infarct embolic secondary to newly diagnosed atrial fibrillation  S/P bilateral common carotid arteriograms , followed by  Resultant right facial droop, dysarthria and right hemiparesis  MRI- acute infarction remain following revascularization of the LEFT ICA, ACA, and MCA, involving the medial LEFT temporal lobe uncus and LEFT basal ganglia. No hemorrhagic transformation  Canceled MRA and Carotid Doppler given cerebral angio  2D Echo ejection fraction was in the range of 45 to 50%.   Heparin subq for VTE prophylaxis  DIET DYS 3   aspirin 81 mg orally every day prior to admission, now on ASA 325mg   Due to new onset afib, will need NOAC. Will start NOAC on Monday to avoid high risk of hemorrhagic transformation.  Ongoing aggressive risk factor management  Therapy recommendations:  CIR consult  Disposition:  CIR pending for bed   New onset afib - on ASA 325mg  - start NOAC on Monday to avoid risk of hemorrhagic transformation - tele monitor - cardiology on board - resume coreg, verapamil, and torsemide  BPH - had urinary retention - put on special foley catheter - remove today - watch for urinary retention - resume tamsulosin and proscar - patient required coud catheter today for urinary retention. 565 mL were obtained. Foley left in place for now. - urinalysis consistent with hematuria but not infection. Suspect traumatic Foley insertion.  Hypertension  Home meds:   Verapamil, coreg  Stable  Resume home meds  Permissive  hypertension for 24-48 hours and then gradually normalized in 5-7 days.  Hyperlipidemia  Home meds:  Tricor, order resumed in hospital, however pt unable to swallow at this time  LDL 58, at goal < 70  On lipitor 20mg   Continue statin at discharge  Diabetes  Home meds:  Glucophage  HgbA1c 6.3 on the  goal < 6.5  SSI  Other Stroke Risk Factors Advanced age ETOH use . Obesity, Body mass index is 36.65 kg/(m^2).  Marland Kitchen Family hx stroke (father)  Other Active Problems  Ventricular tachycardia - resumed verapamil - occasional PVCs noted on telemetry but no wide complex tachycardia.  Other Pertinent History  Hx melanoma  Hospital day # [facility] Pager [phone] 05/23/2014, 4:24 PM     To contact Stroke Continuity provider, please refer to [email or web address] After hours, contact General Neurology

## 2014-05-23 NOTE — Plan of Care (Signed)
Problem: Progression Outcomes Goal: Educational plan initiated Outcome: Completed/Met Date Met:  05/23/14

## 2014-05-23 NOTE — Progress Notes (Signed)
Primary cardiologist: Dr. Virl Axe  Seen for followup: Cardiomyopathy, history of VT, atrial fibrillation  Subjective:    Right sided weakness. Has had some trouble with urination.  Objective:   Temp:  [97.4 F (36.3 C)-98.6 F (37 C)] 97.9 F (36.6 C) (11/08 0707) Pulse Rate:  [51-85] 51 (11/08 0707) Resp:  [18-20] 20 (11/08 0707) BP: (117-143)/(60-87) 133/63 mmHg (11/08 0707) SpO2:  [95 %-100 %] 95 % (11/08 0707) Last BM Date: 05/21/14  Filed Weights   05/18/14 1500 05/18/14 1525 05/18/14 2000  Weight: 240 lb 8.4 oz (109.1 kg) 235 lb 14.3 oz (107 kg) 240 lb 15.4 oz (109.3 kg)    Intake/Output Summary (Last 24 hours) at 05/23/14 0729 Last data filed at 05/23/14 0714  Gross per 24 hour  Intake      0 ml  Output   1125 ml  Net  -1125 ml    Telemetry: Atrial fibrillation with occasional PVCs versus aberration.  Exam:  General: Obese, no distress.  Lungs: Clear, nonlabored.  Cardiac: Irregularly irregular.  Extremities: Trace edema.   Lab Results:  Basic Metabolic Panel:  Recent Labs Lab 05/19/14 1100 05/20/14 0235 05/20/14 0239 05/21/14 0148 05/23/14 0625  NA  --  141  --  142 140  K  --  3.6*  --  3.2* 3.8  CL  --  105  --  100 102  CO2  --  23  --  24 21  GLUCOSE  --  135*  --  125* 120*  BUN  --  8  --  13 22  CREATININE  --  0.64  --  0.65 0.62  CALCIUM  --  8.2*  --  8.8 9.0  MG 1.8  --  1.8  --   --     Liver Function Tests:  Recent Labs Lab 05/18/14 1504  AST 38*  ALT 41  ALKPHOS 44  BILITOT 0.5  PROT 6.9  ALBUMIN 3.9    CBC:  Recent Labs Lab 05/19/14 0510 05/20/14 0235 05/21/14 0148  WBC 8.6 9.4 10.7*  HGB 13.4 14.3 14.8  HCT 39.9 43.3 43.9  MCV 95.7 99.1 96.9  PLT 135* 144* 165    Coagulation:  Recent Labs Lab 05/18/14 1504  INR 1.11     Medications:   Scheduled Medications: .  stroke: mapping our early stages of recovery book   Does not apply Once  . aspirin EC  325 mg Oral Daily  .  atorvastatin  20 mg Oral q1800  . carvedilol  6.25 mg Oral BID WC  . finasteride  5 mg Oral Daily  . heparin subcutaneous  5,000 Units Subcutaneous 3 times per day  . insulin aspart  0-15 Units Subcutaneous TID WC  . metFORMIN  500 mg Oral BID WC  . multivitamin with minerals  1 tablet Oral Daily  . pantoprazole  40 mg Oral Daily  . potassium chloride  20 mEq Oral Daily  . tamsulosin  0.4 mg Oral Daily  . torsemide  10 mg Oral Daily  . verapamil  180 mg Oral QHS    Infusions: . sodium chloride      PRN Medications: labetalol, levalbuterol, ondansetron (ZOFRAN) IV, senna-docusate   Assessment:   1. New onset atrial fibrillation, rate controlled. Starting NOAC on Monday per Neurology.  2. History of verapamil sensitive VT - actually on both beta blocker and calcium channel blocker per Dr. Olin Pia last note August 2015. Occasional PVCs versus aberration.  3.  Cardiomyopathy, LVEF 45-50% with described wall motion abnormalities by recent echocardiogram. Previously normal LVEF and normal coronaries (2012). No further ischemic testing right now. Troponin I negative.  4. LDL 58 on statin.  5. Left MCA infarct.   Plan/Discussion:    Potassium now normal on supplements. Continue Coreg, Verapamil, Lipitor, Demadex, KCL. Change from ASA to NOAC on Monday. Consider ACE-I or ARB later if blood pressure allows.   Satira Sark, M.D., F.A.C.C.

## 2014-05-24 DIAGNOSIS — I472 Ventricular tachycardia: Secondary | ICD-10-CM

## 2014-05-24 LAB — GLUCOSE, CAPILLARY
Glucose-Capillary: 114 mg/dL — ABNORMAL HIGH (ref 70–99)
Glucose-Capillary: 128 mg/dL — ABNORMAL HIGH (ref 70–99)
Glucose-Capillary: 142 mg/dL — ABNORMAL HIGH (ref 70–99)
Glucose-Capillary: 101 mg/dL — ABNORMAL HIGH (ref 70–99)

## 2014-05-24 MED ORDER — APIXABAN 5 MG PO TABS
5.0000 mg | ORAL_TABLET | Freq: Two times a day (BID) | ORAL | Status: DC
  Administered 2014-05-24 – 2014-05-25 (×3): 5 mg via ORAL
  Filled 2014-05-24 (×3): qty 1

## 2014-05-24 NOTE — Evaluation (Signed)
Occupational Therapy Evaluation Patient Details Name: ATHEN RIEL MRN: [id number] DOB: 23-Jan-1941 Today's Date: 05/24/2014    History of Present Illness 73 y.o. [name] brought to [facility] ED on 11/3 as code stroke.  In ED, found to have dense left MCA sign.  tPA administered and pt taken to IR for revascularization.  Pt returned to ICU on vent, extubated 11/4.   Clinical Impression   PT admitted with L MCA. Pt currently with functional limitiations due to the deficits listed below (see OT problem list). PTA independent now with expressive deficits and STM deficits Pt will benefit from skilled OT to increase their independence and safety with adls and balance to allow discharge CIR. OT to follow acutely for adl retraining with dynamic standing balance. Pt could greatly benefit from CIR for balance, cognitive and language deficits.   Family requesting [facility] if denied CIR due to [facility] -- placement. Wife educated on OT role at [facility] due to questions asked during session.   Follow Up Recommendations  CIR    Equipment Recommendations       Recommendations for Other Services Rehab consult     Precautions / Restrictions Precautions Precautions: Fall Precaution Comments: expressive difficulties      Mobility Bed Mobility Overal bed mobility: Needs Assistance Bed Mobility: Supine to Sit     Supine to sit: Supervision     General bed mobility comments: requires use of bed rail  Transfers Overall transfer level: Needs assistance   Transfers: Sit to/from Stand Sit to Stand: Min guard         General transfer comment: pt with bil LE brace against bed surface and needed min (A) to correct    Balance             Standing balance-Leahy Scale: Fair                              ADL Overall ADL's : Needs assistance/impaired     Grooming: Oral care;Wash/dry face;Minimal assistance;Standing Grooming Details (indicate cue type and reason): requires L UE on  sink for support with hip flexion. pt unable to static stand without UE support. Pt pocketing oral secretions with lack of awareness. pt with tooth paste on face and needed cues to use mirror to correct                 Toilet Transfer: Minimal assistance;Ambulation   Toileting- Clothing Manipulation and Hygiene: Maximal assistance;Sit to/from stand Toileting - Clothing Manipulation Details (indicate cue type and reason): pt unable to problem solve where to put dirty toilet paper. Pt needed (A) to complete peri hygiene. Pt x2 incontinent events in bed per Tech.     Functional mobility during ADLs: Minimal assistance General ADL Comments: Pt demonstrates cogntiive balance and speech deficits this session. pt unable to report wifes name. pt needed max cues to rotate neck to the right for visual attention to task. pt with decr neck rotation. Pt unable x2 attempts beginning and end of session to state wifes name. pt able to select correct name with 2 options. Pt demonstrates sequencing deficits     Vision Eye Alignment: Impaired (comment)   Ocular Range of Motion: Impaired-to be further tested in functional context Tracking/Visual Pursuits: Impaired - to be further tested in functional context;Requires cues, head turns, or add eye shifts to track   Convergence: Impaired (comment)   Depth Perception: Overshoots Additional Comments: Pt demostrates deficits with  follow commands. pt with focused attention. pt needed auditory input to track fingers in all 4 quadrants and verbal cues to redirect to task. Vision to be futher assessed due to cognitive deficits. Pt unable to locate wash cloth to the L during sink task.    Perception     Praxis      Pertinent Vitals/Pain Pain Assessment: No/denies pain     Hand Dominance Right   Extremity/Trunk Assessment Upper Extremity Assessment Upper Extremity Assessment: LUE deficits/detail;Difficult to assess due to impaired cognition;RUE  deficits/detail RUE Deficits / Details: decr fine motor and return demo of pointer finger isolation RUE Coordination: decreased gross motor;decreased fine motor LUE Deficits / Details: shoulder flexion 3 out 5   Lower Extremity Assessment Lower Extremity Assessment: Defer to PT evaluation   Cervical / Trunk Assessment Cervical / Trunk Assessment: Normal   Communication Communication Communication: Expressive difficulties   Cognition Arousal/Alertness: Awake/alert Behavior During Therapy: WFL for tasks assessed/performed Overall Cognitive Status: Impaired/Different from baseline Area of Impairment: Orientation;Attention;Memory;Following commands;Safety/judgement;Awareness;Problem solving Orientation Level: Disoriented to;Place;Time Current Attention Level: Sustained Memory: Decreased recall of precautions;Decreased short-term memory Following Commands: Follows one step commands inconsistently Safety/Judgement: Decreased awareness of safety;Decreased awareness of deficits Awareness: Intellectual Problem Solving: Slow processing;Difficulty sequencing General Comments: Pt unable to follow finger to nose test. Pt with poor return demo with visual demo of single digit isolation for "pointer" index finger. pt unabel to sustain. Pt demonstrates poor 2 step command. Pt with lack of awareness to pocketing during oral care, lack of sensation to L side of mouth, decr sequence with oral care, lack of awareness to incontinence of bowel   General Comments       Exercises       Shoulder Instructions      Home Living Family/patient expects to be discharged to:: Inpatient rehab                                        Prior Functioning/Environment Level of Independence: Independent             OT Diagnosis: Generalized weakness;Cognitive deficits   OT Problem List: Decreased strength;Decreased activity tolerance;Impaired balance (sitting and/or standing);Impaired  vision/perception;Decreased coordination;Decreased cognition;Decreased safety awareness;Decreased knowledge of use of DME or AE;Decreased knowledge of precautions   OT Treatment/Interventions: Therapeutic exercise;Self-care/ADL training;DME and/or AE instruction;Therapeutic activities;Cognitive remediation/compensation;Visual/perceptual remediation/compensation;Patient/family education;Balance training    OT Goals(Current goals can be found in the care plan section) Acute Rehab OT Goals Patient Stated Goal: none stated this session OT Goal Formulation: With patient/family Time For Goal Achievement: 06/07/14 Potential to Achieve Goals: Good  OT Frequency: Min 2X/week   Barriers to D/C:            Co-evaluation              End of Session Equipment Utilized During Treatment: Gait belt Nurse Communication: Mobility status;Precautions  Activity Tolerance: Patient tolerated treatment well Patient left: in chair;with call bell/phone within reach;with chair alarm set;with family/visitor present   Time: 3016-0109 OT Time Calculation (min): 25 min Charges:  OT General Charges $OT Visit: 1 Procedure OT Evaluation $Initial OT Evaluation Tier I: 1 Procedure OT Treatments $Self Care/Home Management : 8-22 mins G-Codes:    Peri Maris 06-03-14, 4:43 PM  Pager: [phone]

## 2014-05-24 NOTE — Progress Notes (Signed)
STROKE TEAM PROGRESS NOTE   HISTORY Austin Warren is an 72 y.o. male who was normal this AM 05/18/2014. He went to the bank at 12:30 and noted he had blurred vision. When he came home he mentioned this to his wife and she called his PCP. They were told to take his BP. As they were taking his BP he noted he could not get up from the chair and was unable to move his right side. EMS was called and patient was brought to ED as a code stroke. On arrival he showed a left gaze gaze preference, right facial droop, right sided plegia. Initial CT head showed no acute infarct but did demonstrate a large dense left MCA sign. IR was contacted. IV tPA was started and patient was brought back to IR for cerebral angiogram. NIH stroke score was 23. EKG showed atrial fibrillation, which had not previously been known. Patient was administered IV TPA followed by cerebral angio in IR where he was found to have a T occlusion with complete revascularization of Lt ICA terminus, Lt MCA and Lt ACA with 12mg  of IA TPA, 1 pass with Trevoprovue, and 1 pass with Solitaire withTICI 3 revascularization. He was admitted to the neuro ICU for further evaluation and treatment.   SUBJECTIVE (INTERVAL HISTORY) His wife is at the bedside. She reports he thinks he could pee if he did not have the catheter in. He also complains of constipation. Dr. Erlinda Hong discussed diagnosis, prognosis,  treatment options and plan of care with pt and wife. Will have urology consult regarding urinary retention s/p coude catheter.    OBJECTIVE Recent Labs Lab 05/23/14 0601 05/23/14 1146 05/23/14 1640 05/23/14 2128 05/24/14 0646  GLUCAP 119* 120* 107* 103* 114*    Recent Labs Lab 05/18/14 1504 05/18/14 1516 05/19/14 0510 05/19/14 1100 05/20/14 0235 05/20/14 0239 05/21/14 0148 05/23/14 0625  NA 139 138 137  --  141  --  142 140  K 4.3 3.9 3.8  --  3.6*  --  3.2* 3.8  CL 101 102 102  --  105  --  100 102  CO2 24  --  23  --  23  --  24  21  GLUCOSE 115* 117* 146*  --  135*  --  125* 120*  BUN 18 18 11   --  8  --  13 22  CREATININE 0.87 1.00 0.60  --  0.64  --  0.65 0.62  CALCIUM 9.4  --  7.9*  --  8.2*  --  8.8 9.0  MG  --   --   --  1.8  --  1.8  --   --   PHOS  --   --   --   --   --  2.7  --   --     Recent Labs Lab 05/18/14 1504  AST 38*  ALT 41  ALKPHOS 44  BILITOT 0.5  PROT 6.9  ALBUMIN 3.9    Recent Labs Lab 05/18/14 1504 05/18/14 1516 05/19/14 0510 05/20/14 0235 05/21/14 0148  WBC 6.2  --  8.6 9.4 10.7*  NEUTROABS 4.2  --  7.1 7.6 8.2*  HGB 14.8 15.6 13.4 14.3 14.8  HCT 43.7 46.0 39.9 43.3 43.9  MCV 96.0  --  95.7 99.1 96.9  PLT 167  --  135* 144* 165   No results for input(s): CKTOTAL, CKMB, CKMBINDEX, TROPONINI in the last 168 hours. No results for input(s): LABPROT, INR in the last 72  hours.  Recent Labs  05/22/14 1908  COLORURINE AMBER*  LABSPEC 1.020  PHURINE 5.5  GLUCOSEU NEGATIVE  HGBUR LARGE*  BILIRUBINUR NEGATIVE  KETONESUR 15*  PROTEINUR 30*  UROBILINOGEN 1.0  NITRITE NEGATIVE  LEUKOCYTESUR SMALL*       Component Value Date/Time   CHOL 124 05/19/2014 0510   TRIG 142 05/19/2014 0510   HDL 38* 05/19/2014 0510   CHOLHDL 3.3 05/19/2014 0510   VLDL 28 05/19/2014 0510   LDLCALC 58 05/19/2014 0510   Lab Results  Component Value Date   HGBA1C 6.3* 05/19/2014      Component Value Date/Time   LABOPIA NONE DETECTED 05/19/2014 0605   COCAINSCRNUR NONE DETECTED 05/19/2014 0605   LABBENZ NONE DETECTED 05/19/2014 0605   AMPHETMU NONE DETECTED 05/19/2014 0605   THCU NONE DETECTED 05/19/2014 0605   LABBARB NONE DETECTED 05/19/2014 0605     Recent Labs Lab 05/18/14 1504  ETH <11    Ct Head Wo Contrast 05/18/2014  Suggestion of minimal hypodensity within the left basal ganglia region possibly representing age indeterminate ischemic change. Overall however, there is relative preservation of the gray-white differentiation within the left MCA territory without  suggestion of a large infarct visible at this time. Increased density along the falx and tentorium is favored to represent sequelae of contrast administration.  05/18/2014  Atrophy and minimal chronic microvascular ischemia. No acute abnormality. Dense left MCA compatible with thrombosis.   Cerebral Angio  05/18/2014  S/P bilateral common carotid arteriograms , followed by complete revascularization of T occlusion of Lt ICA terminus ,Lt MCA and Lt ACA with 12mg  of superselective IA TPA , and xi pass with Trevoprovue 4 x 30 mm ,and x 1 pass with Solitaire FR 4 mm x 40 mm with TICI 3 revascularization  MRI head Areas of acute infarction remain following revascularization of the LEFT ICA, ACA, and MCA, involving the medial LEFT temporal lobe uncus and LEFT basal ganglia. No hemorrhagic transformation. No other areas of convexity cortical infarction are observed. Atrophy and small vessel disease, similar to priors.  2D echo - Left ventricle: The cavity size was mildly dilated. Wall thickness was increased in a pattern of moderate LVH. There was mild concentric hypertrophy. Systolic function was mildly reduced. The estimated ejection fraction was in the range of 45% to 50%. There is hypokinesis of the inferior and apical myocardium. Doppler parameters are consistent with high ventricular filling pressure. - Mitral valve: Calcified annulus. Mildly thickened leaflets. There was mild regurgitation. - Left atrium: The atrium was mildly dilated. - Right ventricle: The cavity size was at the upper limits ofnormal. Wall thickness was mildly increased. - Right atrium: The atrium was mildly dilated. - Pulmonary arteries: Systolic pressure was mildly to moderatelyincreased. PA peak pressure: 47 mm Hg (S).   PHYSICAL EXAM  Temp:  [97.2 F (36.2 C)-98.6 F (37 C)] 97.9 F (36.6 C) (11/09 0926) Pulse Rate:  [66-79] 72 (11/09 0926) Resp:  [17-22] 17 (11/09 0926) BP: (109-140)/(52-68) 140/68  mmHg (11/09 0926) SpO2:  [97 %-100 %] 100 % (11/09 0926)  General - Well nourished, well developed, in no apparent distress.  Ophthalmologic - not able to see through.  Cardiovascular - Irregularly irregular rate and rhythm   Mental Status -  Level of arousal and orientation to place, and person were intact, but not to time. Language including mild expressive aphasia and difficulty with naming but intact repetition and comprehension.  Cranial Nerves II - XII - II - Visual field intact OU.  III, IV, VI - Extraocular movements intact. V - Facial sensation intact bilaterally. VII - right facial droop. VIII - Hearing & vestibular intact bilaterally. X - Palate elevates symmetrically. XI - Chin turning & shoulder shrug intact bilaterally. XII - Tongue protrusion intact.  Motor Strength - The patient's strength was right UE 4-/5 with drift and right LE 4/5 with mild drift, LUE and LLE 5/5.  Bulk was normal and fasciculations were absent.   Motor Tone - Muscle tone was assessed at the neck and appendages and was normal.  Reflexes - The patient's reflexes were decreased in all extremities and he had right babinski.  Sensory - Light touch, temperature/pinprick were assessed and were normal.    Coordination - The patient had normal movements in the hands with no ataxia or dysmetria.  Tremor was absent.  Gait and Station - not tested.   ASSESSMENT/PLAN [name] is a 73 y.o. male with PMH of DM, HTN, HLD, VT, BPH and new-onset atrial fibrillation presenting with left gaze preference, right facial droop, right sided plegia. He did receive IV t-PA 05/18/2014 at 1536. He was then taken to IR for a bilateral cerebral angio where he was found to have a T occlusion with complete revascularization of Lt ICA terminus, Lt MCA and Lt ACA with 12mg  of IA TPA, 1 pass with Trevoprovue, and 1 pass with Solitaire with resultantTICI 3 revascularization.  Stroke: Dominant left MCA infarct s/p  IV tPA, IA tPA and mechanical revasculaization of T occlusion. Infarct embolic secondary to newly diagnosed atrial fibrillation  S/P bilateral common carotid arteriograms , followed by  Resultant right facial droop, dysarthria and right hemiparesis  MRI- acute infarction remain following revascularization of the LEFT ICA, ACA, and MCA, involving the medial LEFT temporal lobe uncus and LEFT basal ganglia. No hemorrhagic transformation  Canceled MRA and Carotid Doppler given cerebral angio  2D Echo ejection fraction was in the range of 45 to 50%.   Heparin subq for VTE prophylaxis  DIET DYS 3 thin liquids  aspirin 81 mg orally every day prior to admission, currently on ASA 325mg . Due to new onset afib, will need NOAC. Started Eliquis 5 mg bid today, d/c sq heparin and aspirin.   Ongoing aggressive risk factor management  Therapy recommendations:  CIR consult. OT consult pending  Disposition:  CIR pending for bed and insurance approval. xfer not anticipated today.  New onset afib - on ASA 325mg  - started Eliquis 5 mg bid today. Delay in administration to avoid risk of hemorrhagic transformation - tele monitor - cardiology on board - resumed coreg, verapamil, and torsemide. Hold off ACEI or ARB now due to low BP  BPH - had urinary retention - resumed tamsulosin and proscar - patient required coud catheter Sunday for urinary retention. 565 mL were obtained. Foley left in place. - urinalysis consistent with hematuria but not infection. Suspect traumatic Foley insertion. - will discuss with urology for recommendations/consult  Hypertension  Home meds:   Verapamil, coreg  Stable  Resume home meds  Permissive hypertension for 24-48 hours and then gradually normalized in 5-7 days.  Hyperlipidemia  Home meds:  Tricor, order resumed in hospital, however pt unable to swallow at this time  LDL 58, at goal < 70  On lipitor 20mg   Continue statin at  discharge  Diabetes  Home meds:  Glucophage  HgbA1c 6.3 on the  goal < 6.5  SSI  Other Stroke Risk Factors Advanced age ETOH use . Obesity, Body  mass index is 36.65 kg/(m^2).  Marland Kitchen Family hx stroke (father)  Other Active Problems  Ventricular tachycardia - resumed verapamil - occasional PVCs noted on telemetry but no wide complex tachycardia.  Other Pertinent History  Hx melanoma  Hospital day # 6  [facility] BIBY, MSN, APRN, ANVP-BC, AGPCNP-BC [facility] Pager: [phone] 05/24/2014 10:04 AM   I, the attending vascular neurologist, have personally obtained a history, examined the patient, evaluated laboratory data, individually viewed imaging studies, and formulated the assessment and plan of care.  I have made any additions or clarifications directly to the above note and agree with the findings and plan as currently documented.   Rosalin Hawking, MD PhD Stroke Neurology 05/24/2014 9:49 PM   To contact Stroke Continuity provider, please refer to [email or web address] After hours, contact General Neurology

## 2014-05-24 NOTE — Progress Notes (Signed)
Physical Therapy Treatment Patient Details Name: Austin Warren MRN: [id number] DOB: [birth year] Today's Date: 05/24/2014    History of Present Illness 73 y.o. Austin Warren brought to [facility] ED on 11/3 as code stroke.  In ED, found to have dense left MCA sign.  tPA administered and pt taken to IR for revascularization.  Pt returned to ICU on vent, extubated 11/4.    PT Comments    Patient is progressing well with straight forward mobility however when challenged with high level balance patient requires assistance to complete if he can complete them at all without risk of falling (see BERG in balance section). Patient continues to have issues with communication and recalling information. Patient is planning for OT eval later today and is awaiting to see if CIR will have a bed available. Will continue with current POC  Follow Up Recommendations  CIR     Equipment Recommendations   (TBD)    Recommendations for Other Services       Precautions / Restrictions Precautions Precautions: Fall Precaution Comments: expressive difficulties    Mobility  Bed Mobility         Supine to sit: Min guard     General bed mobility comments: Minguard for safety with use of rails  Transfers Overall transfer level: Needs assistance Equipment used: None Transfers: Sit to/from Stand Sit to Stand: Min assist         General transfer comment: Min A to ensure balance and anterior weight shift forward over BOS  Ambulation/Gait Ambulation/Gait assistance: Min assist Ambulation Distance (Feet): 320 Feet Assistive device: None     Gait velocity interpretation: Below normal speed for age/gender General Gait Details: Patient does well with straightways but requires assistance to ensure balance with turns. See balance section for BERG testing. No buckling of R LE noted this session with ambulation   Stairs            Wheelchair Mobility    Modified Rankin (Stroke Patients Only) Modified Rankin  (Stroke Patients Only) Pre-Morbid Rankin Score: No symptoms Modified Rankin: Moderately severe disability     Balance     Sitting balance-Leahy Scale: Fair   Postural control: Posterior lean   Standing balance-Leahy Scale: Fair                      Cognition Arousal/Alertness: Awake/alert Behavior During Therapy: WFL for tasks assessed/performed Overall Cognitive Status: Impaired/Different from baseline Area of Impairment: Orientation;Attention;Memory;Following commands;Safety/judgement;Awareness;Problem solving       Following Commands: Follows one step commands consistently;Follows one step commands with increased time;Follows multi-step commands inconsistently     Problem Solving: Slow processing;Decreased initiation;Difficulty sequencing;Requires verbal cues General Comments: Patient able to follow simple commands this session with increased time but continues to have issues with processing long instructions or communicating more complex interactions. Patient going back between it being 1967 to 2047. Unable to stated president. When asked to stick out tougue, patient grasping his nose    Exercises      General Comments        Pertinent Vitals/Pain Pain Assessment: No/denies pain    Home Living                      Prior Function            PT Goals (current goals can now be found in the care plan section) Progress towards PT goals: Progressing toward goals    Frequency  Min 3X/week  PT Plan Current plan remains appropriate    Co-evaluation             End of Session Equipment Utilized During Treatment: Gait belt;Oxygen Activity Tolerance: Patient tolerated treatment well Patient left: in chair;with call bell/phone within reach;with chair alarm set;with family/visitor present     Time: 1031-1105 PT Time Calculation (min): 34 min  Charges:  $Gait Training: 8-22 mins $Therapeutic Activity: 8-22 mins                    G  Codes:      Austin Warren 05/24/2014, 12:36 PM  05/24/2014 Austin Warren [phone] pager [phone] office

## 2014-05-24 NOTE — Progress Notes (Signed)
UR COMPLETED  

## 2014-05-24 NOTE — Progress Notes (Signed)
Patient Name: Austin Warren Date of Encounter: 05/24/2014   Principal Problem:   CVA (cerebral vascular accident) Active Problems:   Atrial fibrillation, new onset   Diabetes mellitus   Acute diastolic CHF (congestive heart failure)   Ventricular tachycardia   Essential hypertension   Morbid obesity   Obesity (BMI 30-39.9)   SUBJECTIVE  No chest pain or sob.  R sided strength improving.  Maintaining afib - rate controlled  - 60's to 70's.  CURRENT MEDS .  stroke: mapping our early stages of recovery book   Does not apply Once  . aspirin EC  325 mg Oral Daily  . atorvastatin  20 mg Oral q1800  . carvedilol  6.25 mg Oral BID WC  . finasteride  5 mg Oral Daily  . heparin subcutaneous  5,000 Units Subcutaneous 3 times per day  . insulin aspart  0-15 Units Subcutaneous TID WC  . metFORMIN  500 mg Oral BID WC  . multivitamin with minerals  1 tablet Oral Daily  . pantoprazole  40 mg Oral Daily  . potassium chloride  20 mEq Oral Daily  . tamsulosin  0.4 mg Oral Daily  . torsemide  10 mg Oral Daily  . verapamil  180 mg Oral QHS    OBJECTIVE  Filed Vitals:   05/23/14 2000 05/24/14 0038 05/24/14 0400 05/24/14 0926  BP: 128/65 124/62 114/58 140/68  Pulse: 76 66 72 72  Temp: 97.2 F (36.2 C) 98.1 F (36.7 C) 98.6 F (37 C) 97.9 F (36.6 C)  TempSrc: Oral Oral Oral Oral  Resp: 20 20 22 17   Height:      Weight:      SpO2: 98% 97% 97% 100%    Intake/Output Summary (Last 24 hours) at 05/24/14 1014 Last data filed at 05/24/14 0530  Gross per 24 hour  Intake      0 ml  Output   1200 ml  Net  -1200 ml   Filed Weights   05/18/14 1500 05/18/14 1525 05/18/14 2000  Weight: 240 lb 8.4 oz (109.1 kg) 235 lb 14.3 oz (107 kg) 240 lb 15.4 oz (109.3 kg)    PHYSICAL EXAM  General: Pleasant, NAD. Neuro: Alert and oriented X 3. Moves all extremities spontaneously - strength 3/5 on right, 4/5 left. Psych: Normal affect. HEENT:  Normal  Neck: Supple without bruits or  JVD. Lungs:  Resp regular and unlabored, diminished breath sounds bilat. Heart: IR, IR, no s3, s4, or murmurs. Abdomen: Soft, non-tender, non-distended, BS + x 4.  Extremities: No clubbing, cyanosis or edema. DP/PT/Radials 2+ and equal bilaterally.  Accessory Clinical Findings  Basic Metabolic Panel  Recent Labs  05/23/14 0625  NA 140  K 3.8  CL 102  CO2 21  GLUCOSE 120*  BUN 22  CREATININE 0.62  CALCIUM 9.0   TELE  Afib, 60's to 70's, occas pvc's/couplets.  2D Echocardiogram 11.4.2015  Study Conclusions  - Left ventricle: The cavity size was mildly dilated. Wall   thickness was increased in a pattern of moderate LVH. There was   mild concentric hypertrophy. Systolic function was mildly   reduced. The estimated ejection fraction was in the range of 45%   to 50%. There is hypokinesis of the inferior and apical   myocardium. Doppler parameters are consistent with high   ventricular filling pressure. - Mitral valve: Calcified annulus. Mildly thickened leaflets .   There was mild regurgitation. - Left atrium: The atrium was mildly dilated. - Right ventricle: The  cavity size was at the upper limits of   normal. Wall thickness was mildly increased. - Right atrium: The atrium was mildly dilated. - Pulmonary arteries: Systolic pressure was mildly to moderately   increased. PA peak pressure: 47 mm Hg (S).  Impressions:  - No cardiac source of emboli was indentified. _____________   ASSESSMENT AND PLAN  1. Left MCA infarct: s/p IV TPA and complete revascularization of Lt ICA terminus, Lt MCA and Lt ACA with 12mg  of IA TPA, 1 pass with Trevoprovue, and 1 pass with Solitaire withTICI 3 revascularization.  Neurology following.  Plan to start NOAC today 2/2 afib.  2.  New onset atrial fibrillation:  Rate controlled on bb/verapamil. Eliquis to start today.  D/C ASA.  3. History of verapamil sensitive VT:  On both beta blocker and calcium channel blocker per Dr. Olin Pia  last note August 2015. Occasional PVCs on tele.  4. Cardiomyopathy:  LVEF 45-50% with described wall motion abnormalities by recent echocardiogram. Previously normal LVEF and normal coronaries (2012). No further ischemic testing right now. Troponin I negative.  Cont BB.  Will consider addition of acei/arb as outpt if BP's stable.  5. HL:  LDL 58 on statin.   Signed, Murray Hodgkins NP  Patient examined chart reviewed  Rhythm stable on bb/verapamil.  Eliquis to start today ok with neuro.  Rate control Is fine Exam with slight right sided weakness.    Jenkins Rouge

## 2014-05-24 NOTE — Progress Notes (Signed)
Rehab admissions - i met with patient and his wife this am.  They would like inpatient rehab admission.  We are waiting on an OT consult and then can send all information to insurance carrier seeking inpatient rehab approval.  Currently rehab beds are full.  I explained this to patient/Wife.  They are from [place].  If CIR is not an option, the are interested in the village at [facility] in [place] (they want the SNF associated with [facility] in [place]).  I will follow up again tomorrow for bed availability and plans.  Call me for questions.  #[phone]

## 2014-05-25 ENCOUNTER — Encounter: Payer: Self-pay | Admitting: Internal Medicine

## 2014-05-25 DIAGNOSIS — E1159 Type 2 diabetes mellitus with other circulatory complications: Secondary | ICD-10-CM

## 2014-05-25 LAB — GLUCOSE, CAPILLARY
GLUCOSE-CAPILLARY: 106 mg/dL — AB (ref 70–99)
GLUCOSE-CAPILLARY: 140 mg/dL — AB (ref 70–99)

## 2014-05-25 MED ORDER — LISINOPRIL 5 MG PO TABS
5.0000 mg | ORAL_TABLET | Freq: Every day | ORAL | Status: DC
  Administered 2014-05-25: 5 mg via ORAL
  Filled 2014-05-25: qty 1

## 2014-05-25 MED ORDER — LISINOPRIL 5 MG PO TABS: 5.0000 mg | ORAL_TABLET | Freq: Every day | ORAL | Status: DC

## 2014-05-25 MED ORDER — APIXABAN 5 MG PO TABS: 5.0000 mg | ORAL_TABLET | Freq: Two times a day (BID) | ORAL | Status: DC

## 2014-05-25 MED ORDER — ATORVASTATIN CALCIUM 20 MG PO TABS: 20.0000 mg | ORAL_TABLET | Freq: Every day | ORAL | Status: DC

## 2014-05-25 NOTE — Progress Notes (Signed)
Clinical Social Worker facilitated patient discharge including contacting patient family and facility to confirm patient discharge plans.  Clinical information faxed to facility and family agreeable with plan.  RN reported she will arrange ambulance transport via [facility] to [facility] at [facility] in [place] room [address].  RN to call report prior to discharge.  Clinical Social Worker will sign off for now as social work intervention is no longer needed. Please consult [place] again if new need arises.  Glendon Axe, MSW, LCSWA [phone] 05/25/2014 4:31 PM

## 2014-05-25 NOTE — Clinical Social Work Placement (Addendum)
Clinical Social Work Department CLINICAL SOCIAL WORK PLACEMENT NOTE 05/25/2014  Patient:  Austin Warren, Austin Warren  Account Number:  [id number] Admit date:  05/18/2014  Clinical Social Worker:  Glendon Axe, CLINICAL SOCIAL WORKER  Date/time:  05/25/2014 10:14 AM  Clinical Social Work is seeking post-discharge placement for this patient at the following level of care:   SKILLED NURSING   (*CSW will update this form in Epic as items are completed)   05/25/2014  Patient/family provided with [place] Department of Clinical Social Work's list of facilities offering this level of care within the geographic area requested by the patient (or if unable, by the patient's family).  05/25/2014  Patient/family informed of their freedom to choose among providers that offer the needed level of care, that participate in Medicare, Medicaid or managed care program needed by the patient, have an available bed and are willing to accept the patient.  05/25/2014  Patient/family informed of [facility]' ownership interest in [facility], as well as of the fact that they are under no obligation to receive care at this facility.  PASARR submitted to EDS on 05/25/2014 PASARR number received on 05/25/2014  FL2 transmitted to all facilities in geographic area requested by pt/family on  05/25/2014 FL2 transmitted to all facilities within larger geographic area on 05/25/2014  Patient informed that his/her managed care company has contracts with or will negotiate with  certain facilities, including the following:   YES     Patient/family informed of bed offers received:  05/25/2014 Patient chooses bed at [facility] at the [place] in [place].  Physician recommends and patient chooses bed at    Patient to be transferred to [facility] at the [facility] in [place]  on 05/25/2014 Patient to be transferred to facility by [facility] (RN will arrange transportation) Patient and family notified of  transfer on 05/25/2014 Name of family member notified: Pt's wife, Austin Warren at bedside.   The following physician request were entered in Epic:   Additional Comments:  Glendon Axe, MSW, LCSWA [phone] 05/25/2014 10:15 AM

## 2014-05-25 NOTE — Addendum Note (Signed)
Addendum  created 05/25/14 1621 by Tiajuana Amass, MD   Modules edited: Anesthesia Attestations

## 2014-05-25 NOTE — Progress Notes (Signed)
STROKE TEAM PROGRESS NOTE   HISTORY Austin Warren is an 73 y.o. male who was normal this AM 05/18/2014. He went to the bank at 12:30 and noted he had blurred vision. When he came home he mentioned this to his wife and she called his PCP. They were told to take his BP. As they were taking his BP he noted he could not get up from the chair and was unable to move his right side. EMS was called and patient was brought to ED as a code stroke. On arrival he showed a left gaze gaze preference, right facial droop, right sided plegia. Initial CT head showed no acute infarct but did demonstrate a large dense left MCA sign. IR was contacted. IV tPA was started and patient was brought back to IR for cerebral angiogram. NIH stroke score was 23. EKG showed atrial fibrillation, which had not previously been known. Patient was administered IV TPA followed by cerebral angio in IR where he was found to have a T occlusion with complete revascularization of Lt ICA terminus, Lt MCA and Lt ACA with 12mg  of IA TPA, 1 pass with Trevoprovue, and 1 pass with Solitaire withTICI 3 revascularization. He was admitted to the neuro ICU for further evaluation and treatment.   SUBJECTIVE (INTERVAL HISTORY) Patient up in chair at bedside. Awaiting disposition. Urology consulted and will come to see him this pm. Keep the coude catheter in place.    OBJECTIVE  Recent Labs Lab 05/24/14 0646 05/24/14 1141 05/24/14 1657 05/24/14 2146 05/25/14 0630  GLUCAP 114* 128* 142* 101* 106*    Recent Labs Lab 05/18/14 1504 05/18/14 1516 05/19/14 0510 05/19/14 1100 05/20/14 0235 05/20/14 0239 05/21/14 0148 05/23/14 0625  NA 139 138 137  --  141  --  142 140  K 4.3 3.9 3.8  --  3.6*  --  3.2* 3.8  CL 101 102 102  --  105  --  100 102  CO2 24  --  23  --  23  --  24 21  GLUCOSE 115* 117* 146*  --  135*  --  125* 120*  BUN 18 18 11   --  8  --  13 22  CREATININE 0.87 1.00 0.60  --  0.64  --  0.65 0.62  CALCIUM 9.4  --   7.9*  --  8.2*  --  8.8 9.0  MG  --   --   --  1.8  --  1.8  --   --   PHOS  --   --   --   --   --  2.7  --   --     Recent Labs Lab 05/18/14 1504  AST 38*  ALT 41  ALKPHOS 44  BILITOT 0.5  PROT 6.9  ALBUMIN 3.9    Recent Labs Lab 05/18/14 1504 05/18/14 1516 05/19/14 0510 05/20/14 0235 05/21/14 0148  WBC 6.2  --  8.6 9.4 10.7*  NEUTROABS 4.2  --  7.1 7.6 8.2*  HGB 14.8 15.6 13.4 14.3 14.8  HCT 43.7 46.0 39.9 43.3 43.9  MCV 96.0  --  95.7 99.1 96.9  PLT 167  --  135* 144* 165   No results for input(s): CKTOTAL, CKMB, CKMBINDEX, TROPONINI in the last 168 hours. No results for input(s): LABPROT, INR in the last 72 hours.  Recent Labs  05/22/14 [place] 1.020  PHURINE 5.5  [place]  15*  PROTEINUR 30*  UROBILINOGEN 1.0  NITRITE NEGATIVE  LEUKOCYTESUR SMALL*       Component Value Date/Time   CHOL 124 05/19/2014 0510   TRIG 142 05/19/2014 0510   HDL 38* 05/19/2014 0510   CHOLHDL 3.3 05/19/2014 0510   VLDL 28 05/19/2014 0510   LDLCALC 58 05/19/2014 0510   Lab Results  Component Value Date   HGBA1C 6.3* 05/19/2014      Component Value Date/Time   LABOPIA NONE DETECTED 05/19/2014 0605   COCAINSCRNUR NONE DETECTED 05/19/2014 0605   LABBENZ NONE DETECTED 05/19/2014 0605   AMPHETMU NONE DETECTED 05/19/2014 0605   THCU NONE DETECTED 05/19/2014 0605   LABBARB NONE DETECTED 05/19/2014 0605     Recent Labs Lab 05/18/14 1504  ETH <11    Ct Head Wo Contrast 05/18/2014  Suggestion of minimal hypodensity within the left basal ganglia region possibly representing age indeterminate ischemic change. Overall however, there is relative preservation of the gray-white differentiation within the left MCA territory without suggestion of a large infarct visible at this time. Increased density along the falx and tentorium is favored to represent sequelae of contrast administration.   05/18/2014  Atrophy and minimal chronic microvascular ischemia. No acute abnormality. Dense left MCA compatible with thrombosis.   Cerebral Angio  05/18/2014  S/P bilateral common carotid arteriograms , followed by complete revascularization of T occlusion of Lt ICA terminus ,Lt MCA and Lt ACA with 12mg  of superselective IA TPA , and xi pass with Trevoprovue 4 x 30 mm ,and x 1 pass with Solitaire FR 4 mm x 40 mm with TICI 3 revascularization  MRI head Areas of acute infarction remain following revascularization of the LEFT ICA, ACA, and MCA, involving the medial LEFT temporal lobe uncus and LEFT basal ganglia. No hemorrhagic transformation. No other areas of convexity cortical infarction are observed. Atrophy and small vessel disease, similar to priors.  2D echo - Left ventricle: The cavity size was mildly dilated. Wall thickness was increased in a pattern of moderate LVH. There was mild concentric hypertrophy. Systolic function was mildly reduced. The estimated ejection fraction was in the range of 45% to 50%. There is hypokinesis of the inferior and apical myocardium. Doppler parameters are consistent with high ventricular filling pressure. - Mitral valve: Calcified annulus. Mildly thickened leaflets. There was mild regurgitation. - Left atrium: The atrium was mildly dilated. - Right ventricle: The cavity size was at the upper limits ofnormal. Wall thickness was mildly increased. - Right atrium: The atrium was mildly dilated. - Pulmonary arteries: Systolic pressure was mildly to moderatelyincreased. PA peak pressure: 47 mm Hg (S).   PHYSICAL EXAM  Temp:  [97.9 F (36.6 C)-98.4 F (36.9 C)] 97.9 F (36.6 C) (11/10 2703) Pulse Rate:  [63-77] 64 (11/10 0638) Resp:  [16-22] 20 (11/10 0638) BP: (116-140)/(60-79) 127/79 mmHg (11/10 0638) SpO2:  [95 %-100 %] 100 % (11/10 5009)  General - Well nourished, well developed, in no apparent distress.  Ophthalmologic - not able to  see through.  Cardiovascular - Irregularly irregular rate and rhythm   Mental Status -  Level of arousal and orientation to place, and person were intact, but not to time. Language including mild expressive aphasia and difficulty with naming but intact repetition and comprehension.  Cranial Nerves II - XII - II - Visual field intact OU. III, IV, VI - Extraocular movements intact. V - Facial sensation intact bilaterally. VII - right facial droop. VIII - Hearing & vestibular intact bilaterally. X -  Palate elevates symmetrically. XI - Chin turning & shoulder shrug intact bilaterally. XII - Tongue protrusion intact.  Motor Strength - The patient's strength was right UE 4-/5 with drift and right LE 4/5 with mild drift, LUE and LLE 5/5.  Bulk was normal and fasciculations were absent.   Motor Tone - Muscle tone was assessed at the neck and appendages and was normal.  Reflexes - The patient's reflexes were decreased in all extremities and he had right babinski.  Sensory - Light touch, temperature/pinprick were assessed and were normal.    Coordination - The patient had normal movements in the hands with no ataxia or dysmetria.  Tremor was absent.  Gait and Station - not tested.   ASSESSMENT/PLAN Austin Warren is a 73 y.o. male with PMH of DM, HTN, HLD, VT, BPH and new-onset atrial fibrillation presenting with left gaze preference, right facial droop, right sided plegia. He did receive IV t-PA 05/18/2014 at 1536. He was then taken to IR for a bilateral cerebral angio where he was found to have a T occlusion with complete revascularization of Lt ICA terminus, Lt MCA and Lt ACA with 12mg  of IA TPA, 1 pass with Trevoprovue, and 1 pass with Solitaire with resultantTICI 3 revascularization.  Stroke: Dominant left MCA infarct s/p IV tPA, IA tPA and mechanical revasculaization of T occlusion. Infarct embolic secondary to newly diagnosed atrial fibrillation   Resultant right facial  droop, dysarthria and right hemiparesis  MRI- acute infarction remain following revascularization of the LEFT ICA, ACA, and MCA, involving the medial LEFT temporal lobe uncus and LEFT basal ganglia. No hemorrhagic transformation  Canceled MRA and Carotid Doppler given cerebral angio  2D Echo ejection fraction was in the range of 45 to 50%.   Heparin subq for VTE prophylaxis  DIET DYS 3 thin liquids  aspirin 81 mg orally every day prior to admission, changed to Eliquis 5 mg bid  Ongoing aggressive risk factor management  Therapy recommendations:  CIR   Disposition:  CIR bed not available. Plan SNF placement once bed found.  New onset afib  on ASA 325mg  prior to admission  Changed to Eliquis 5 mg bid   tele monitor  cardiology on board  BPH/urinary retention  resumed tamsulosin and proscar  patient required coud catheter Sunday for urinary retention. 565 mL were obtained. Foley left in place.  urinalysis consistent with hematuria but not infection. Suspect traumatic Foley insertion.  urology to consult today for recommendations. If SNF bed available prior to urology consult, will arrange follow up at discharge   Hypertension  Home meds:   Verapamil, coreg, torsemide  Stable  Resumed home meds  Add low dose Ace as recommended by cardiology (lisinopril 5 mg daily)  Hyperlipidemia  Home meds:  Tricor  LDL 58, at goal < 70  Changed to lipitor 20mg   Continue statin at discharge. D/c tricor  Diabetes  Home meds:  Glucophage  HgbA1c 6.3 on the  goal < 6.5  SSI  Other Stroke Risk Factors Advanced age ETOH use . Obesity, Body mass index is 36.65 kg/(m^2).  Marland Kitchen Family hx stroke (father)  Other Active Problems  Ventricular tachycardia - resumed verapamil - occasional PVCs noted on telemetry but no wide complex tachycardia.  Other Pertinent History  Hx melanoma  Hospital day # 7  [facility] BIBY, MSN, APRN, ANVP-BC, AGPCNP-BC [facility] Pager: [phone] 05/25/2014 9:24 AM   I, the attending vascular neurologist, have personally obtained a history, examined the  patient, evaluated laboratory data, individually viewed imaging studies, and formulated the assessment and plan of care.  I have made any additions or clarifications directly to the above note and agree with the findings and plan as currently documented.   Rosalin Hawking, MD PhD Stroke Neurology 05/25/2014 10:48 AM    To contact Stroke Continuity provider, please refer to [email or web address] After hours, contact General Neurology

## 2014-05-25 NOTE — Progress Notes (Signed)
Rehab admissions - I have sent OT consult to insurance carrier.  However, I have no rehab beds available today and I do not anticipate beds for tomorrow.  Agree with need for placement in a facility outside [facility].  Call me for questions.  #[phone]

## 2014-05-25 NOTE — Plan of Care (Signed)
Problem: SLP Dysphagia Goals Goal: Patient will utilize recommended strategies Patient will utilize recommended strategies during swallow to increase swallowing safety with  Outcome: Progressing     

## 2014-05-25 NOTE — Progress Notes (Signed)
Pt was picked up by EMS for d/c. Left with caude foley intact. Pt calm and cooperative. IV and tele removed prior to discharge. Report called to facility by nurses from shift prior. Wife was called to be told pt on the way.

## 2014-05-25 NOTE — Clinical Social Work Psychosocial (Signed)
Clinical Social Work Department BRIEF PSYCHOSOCIAL ASSESSMENT 05/25/2014  Patient:  Austin Warren, Austin Warren     Account Number:  [id number]     Admit date:  05/18/2014  Clinical Social Worker:  Glendon Axe, CLINICAL SOCIAL WORKER  Date/Time:  05/25/2014 10:05 AM  Referred by:  Physician  Date Referred:  05/25/2014 Referred for  SNF Placement   Other Referral:   Interview type:  Patient Other interview type:    PSYCHOSOCIAL DATA Living Status:  WIFE Admitted from facility:   Level of care:   Primary support name:  Austin Warren [phone] Primary support relationship to patient:  SPOUSE Degree of support available:   Strong    CURRENT CONCERNS Current Concerns  Post-Acute Placement   Other Concerns:    SOCIAL WORK ASSESSMENT / PLAN Clinical Social Worker interviewed patient in reference to post-acute placement for SNF. CSW explained SNF process. Pt stated he is agreeable to SNF and would like to be placed in the [place] area. CSW has sumbitted clinicals to both [facility] and [employer] as a backup. FL-2 signed. CSW will continue to follow pt for support and to facilitate pt's discharge needs once medically stable.   Assessment/plan status:  Psychosocial Support/Ongoing Assessment of Needs Other assessment/ plan:   Information/referral to community resources:   SNF information/list provided.     PATIENT'S/FAMILY'S RESPONSE TO PLAN OF CARE: Pt alert, oriented and sitting at bedside. Pt reported he is agreeable to SNF placement and would like placement in the [place] area. Pt smiling and stated he is feeling much better.   Glendon Axe, MSW, LCSWA [phone] 05/25/2014 10:13 AM

## 2014-05-25 NOTE — Discharge Summary (Signed)
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    Atrial fibrillation, new onset   Essential hypertension   Diabetes mellitus   Acute diastolic CHF (congestive heart failure)   BPH   Urinary retention   Hyperlipidemia   Advanced age   ETOH use   Family hx stroke (father)   Past Medical History  Diagnosis Date  . Diabetes mellitus   . Hypertension   . Hyperlipidemia   . History of colon polyps   . Benign prostatic hypertrophy   . Arthritis   . Ventricular tachycardia     RBB/LAHB ideopathic VT, 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  . Melanoma     under arm  . Osteoarthritis   . Dyspnea on exertion 11/01/2011   Past Surgical History  Procedure Laterality Date  . Replacement total knee    . Vasectomy    . Joint replacement      R TKR  . Replacement total knee Left   . Radiology with anesthesia N/A 05/18/2014    Procedure: RADIOLOGY WITH ANESTHESIA;  Surgeon: Rob Hickman, MD;  Location: [facility];  Service: Radiology;  Laterality: N/A;      Medication List    STOP taking these medications        fenofibrate 145 MG tablet  Commonly known as:  TRICOR      TAKE these medications        apixaban 5 MG Tabs tablet  Commonly known as:   ELIQUIS  Take 1 tablet (5 mg total) by mouth 2 (two) times daily.     atorvastatin 20 MG tablet  Commonly known as:  LIPITOR  Take 1 tablet (20 mg total) by mouth daily at 6 PM.     carvedilol 6.25 MG tablet  Commonly known as:  COREG  Take 6.25 mg by mouth 2 (two) times daily with a meal.     finasteride 5 MG tablet  Commonly known as:  PROSCAR  Take 5 mg by mouth daily.     lisinopril 5 MG tablet  Commonly known as:  PRINIVIL,ZESTRIL  Take 1 tablet (5 mg total) by mouth daily.     metFORMIN 500 MG tablet  Commonly known as:  GLUCOPHAGE  Take 500 mg by mouth 2 (two) times daily with a meal.     multivitamin with minerals Tabs tablet  Take 1 tablet by mouth daily. One A Day 50+     tamsulosin 0.4 MG Caps capsule  Commonly known as:  FLOMAX  Take 0.4 mg by mouth daily after breakfast.     torsemide 10 MG tablet  Commonly known as:  DEMADEX  Take 10 mg by mouth daily.     verapamil 180 MG CR tablet  Commonly known as:  CALAN-SR  Take 180 mg by  mouth at bedtime.     vitamin C 500 MG tablet  Commonly known as:  ASCORBIC ACID  Take 500 mg by mouth daily.     Vitamin D3 1000 UNITS Caps  Take 1,000 Units by mouth daily.        LABORATORY STUDIES CBC    Component Value Date/Time   WBC 10.7* 05/21/2014 0148   RBC 4.53 05/21/2014 0148   HGB 14.8 05/21/2014 0148   HCT 43.9 05/21/2014 0148   PLT 165 05/21/2014 0148   MCV 96.9 05/21/2014 0148   MCH 32.7 05/21/2014 0148   MCHC 33.7 05/21/2014 0148   RDW 13.1 05/21/2014 0148   LYMPHSABS 1.3 05/21/2014 0148   MONOABS 1.2* 05/21/2014 0148   EOSABS 0.0 05/21/2014 0148   BASOSABS 0.0 05/21/2014 0148   CMP    Component Value Date/Time   NA 140 05/23/2014 0625   K 3.8 05/23/2014 0625   CL 102 05/23/2014 0625   CO2 21 05/23/2014 0625   GLUCOSE 120* 05/23/2014 0625   BUN 22 05/23/2014 0625   CREATININE 0.62 05/23/2014 0625   CALCIUM 9.0 05/23/2014 0625   PROT 6.9 05/18/2014 1504   ALBUMIN 3.9 05/18/2014 1504    AST 38* 05/18/2014 1504   ALT 41 05/18/2014 1504   ALKPHOS 44 05/18/2014 1504   BILITOT 0.5 05/18/2014 1504   GFRNONAA >90 05/23/2014 0625   GFRAA >90 05/23/2014 0625   COAGS Lab Results  Component Value Date   INR 1.11 05/18/2014   INR 1.0 03/12/2011   INR 1.04 11/06/2010   Lipid Panel    Component Value Date/Time   CHOL 124 05/19/2014 0510   TRIG 142 05/19/2014 0510   HDL 38* 05/19/2014 0510   CHOLHDL 3.3 05/19/2014 0510   VLDL 28 05/19/2014 0510   LDLCALC 58 05/19/2014 0510   HgbA1C  Lab Results  Component Value Date   HGBA1C 6.3* 05/19/2014   Cardiac Panel (last 3 results) No results for input(s): CKTOTAL, CKMB, TROPONINI, RELINDX in the last 72 hours. Urinalysis    Component Value Date/Time   COLORURINE AMBER* 05/22/2014 1908   APPEARANCEUR CLOUDY* 05/22/2014 1908   LABSPEC 1.020 05/22/2014 1908   PHURINE 5.5 05/22/2014 1908   GLUCOSEU NEGATIVE 05/22/2014 1908   HGBUR LARGE* 05/22/2014 1908   BILIRUBINUR NEGATIVE 05/22/2014 1908   KETONESUR 15* 05/22/2014 1908   PROTEINUR 30* 05/22/2014 1908   UROBILINOGEN 1.0 05/22/2014 1908   NITRITE NEGATIVE 05/22/2014 1908   LEUKOCYTESUR SMALL* 05/22/2014 1908   Urine Drug Screen     Component Value Date/Time   LABOPIA NONE DETECTED 05/19/2014 0605   COCAINSCRNUR NONE DETECTED 05/19/2014 0605   LABBENZ NONE DETECTED 05/19/2014 0605   AMPHETMU NONE DETECTED 05/19/2014 0605   THCU NONE DETECTED 05/19/2014 0605   LABBARB NONE DETECTED 05/19/2014 0605    Alcohol Level    Component Value Date/Time   ETH <11 05/18/2014 1504     SIGNIFICANT DIAGNOSTIC STUDIES Ct Head Wo Contrast 05/18/2014  Suggestion of minimal hypodensity within the left basal ganglia region possibly representing age indeterminate ischemic change. Overall however, there is relative preservation of the gray-white differentiation within the left MCA territory without suggestion of a large infarct visible at this time. Increased density along the  falx and tentorium is favored to represent sequelae of contrast administration.  05/18/2014  Atrophy and minimal chronic microvascular ischemia. No acute abnormality. Dense left MCA compatible with thrombosis.   Cerebral Angio  05/18/2014  S/P bilateral common carotid arteriograms , followed by  complete revascularization of T occlusion of Lt ICA terminus ,Lt MCA and Lt ACA with 12mg  of superselective IA TPA , and xi pass with Trevoprovue 4 x 30 mm ,and x 1 pass with Solitaire FR 4 mm x 40 mm with TICI 3 revascularization  MRI head Areas of acute infarction remain following revascularization of the LEFT ICA, ACA, and MCA, involving the medial LEFT temporal lobe uncus and LEFT basal ganglia. No hemorrhagic transformation. No other areas of convexity cortical infarction are observed. Atrophy and small vessel disease, similar to priors.  2D echo - Left ventricle: The cavity size was mildly dilated. Wall thickness was increased in a pattern of moderate LVH. There was mild concentric hypertrophy. Systolic function was mildly reduced. The estimated ejection fraction was in the range of 45% to 50%. There is hypokinesis of the inferior and apical myocardium. Doppler parameters are consistent with high ventricular filling pressure. - Mitral valve: Calcified annulus. Mildly thickened leaflets. There was mild regurgitation. - Left atrium: The atrium was mildly dilated. - Right ventricle: The cavity size was at the upper limits ofnormal. Wall thickness was mildly increased. - Right atrium: The atrium was mildly dilated. - Pulmonary arteries: Systolic pressure was mildly to moderatelyincreased. PA peak pressure: 47 mm Hg (S).     HISTORY OF PRESENT ILLNESS Austin Warren is an 73 y.o. male who was normal this AM 05/18/2014. He went to the bank at 12:30 and noted he had blurred vision. When he came home he mentioned this to his wife and she called his PCP. They were told to take his BP.  As they were taking his BP he noted he could not get up from the chair and was unable to move his right side. EMS was called and patient was brought to ED as a code stroke. On arrival he showed a left gaze gaze preference, right facial droop, right sided plegia. Initial CT head showed no acute infarct but did demonstrate a large dense left MCA sign. IR was contacted. IV tPA was started and patient was brought back to IR for cerebral angiogram. NIH stroke score was 23. EKG showed atrial fibrillation, which had not previously been known. Patient was administered IV TPA followed by cerebral angio in IR where he was found to have a T occlusion with complete revascularization of Lt ICA terminus, Lt MCA and Lt ACA with 12mg  of IA TPA, 1 pass with Trevoprovue, and 1 pass with Solitaire withTICI 3 revascularization. He was admitted to the neuro ICU for further evaluation and treatment.    HOSPITAL COURSE [name] is a 73 y.o. male with PMH of DM, HTN, HLD, VT, BPH and new-onset atrial fibrillation presenting with left gaze preference, right facial droop, right sided plegia. He did receive IV t-PA 05/18/2014 at 1536. He was then taken to IR for a bilateral cerebral angio where he was found to have a T occlusion with complete revascularization of Lt ICA terminus, Lt MCA and Lt ACA with 12mg  of IA TPA, 1 pass with Trevoprovue, and 1 pass with Solitaire with resultantTICI 3 revascularization.  Stroke: Dominant left MCA infarct s/p IV tPA, IA tPA and mechanical revasculaization of T occlusion. Infarct embolic secondary to newly diagnosed atrial fibrillation   Resultant right facial droop, dysarthria and right hemiparesis  MRI- acute infarction remain following revascularization of the LEFT ICA, ACA, and MCA, involving the medial LEFT temporal lobe uncus and LEFT basal ganglia. No hemorrhagic transformation  Canceled MRA and Carotid Doppler  given cerebral angio  2D Echo ejection fraction was  in the range of 45 to 50%.   aspirin 81 mg orally every day prior to admission, changed to Eliquis 5 mg bid  Ongoing aggressive risk factor management  Therapy recommendations: CIR   Disposition: CIR bed not available. Plan discharge to skilled nursing facility for ongoing PT, OT and ST.   New onset afib  on ASA 325mg  prior to admission  CHA2DS2-Vasc score: 7 (LV dysfunct, HTN, age, DM, stroke)  cardiology consulted  Plan to continue Coreg 6.25mg  BID and Verapamil 180mg  daily for rate control  Started Eliquis 5mg  BID on 05/24/2014 for anticoagulation.  Patient is rate controlled, continues in a-fib, would not cardiovert given recent stroke. Can potentially consider cardioversion after 3 weeks.   Follow up with Richardson Dopp, PA with Dr. Caryl Comes 06/22/2014 at 2p  History of verapamil sensitive Ventricular Tachycardia:  Both beta blocker and calcium channel blocker per Dr. Olin Pia last note August 2015.   Occasional PVCs on tele.  Cardiomyopathy:   Cath Apr 2012 normal coronaries  EF in 11/2011 was 50-55% with no wall motion abnormalities.  Echo on 05/19/2014 showed EF 45-50% with wall motion abnormalities. (inferior and apical hypokinesis)  No further ischemic testing right now. Troponin I negative.   Continue Coreg and Torsemide.  Started Lisinopril 5mg  daily on 05/25/2014.  BPH/urinary retention  resumed tamsulosin and proscar  patient required coud catheter Sunday for urinary retention. 565 mL were obtained. Foley left in place.  urinalysis consistent with hematuria but not infection. Suspect traumatic Foley insertion.  urology to consult today for recommendations. If SNF bed available prior to urology consult, will leave foley in and follow up with his urologist in [facility] following discharge   Hypertension  Home meds: Verapamil, coreg, torsemide  Stable  Resumed home meds  Add low dose Ace as recommended by cardiology (lisinopril 5 mg  daily)  Hyperlipidemia  Home meds: Tricor  LDL 58, at goal < 70  Changed to lipitor 20mg   Continue statin at discharge. D/c tricor  Diabetes  Home meds: Glucophage  HgbA1c 6.3 on the goal < 6.5  SSI  Other Stroke Risk Factors  Advanced age  ETOH use  Obesity, Body mass index is 36.65 kg/(m^2).   Family hx stroke (father)  Other Pertinent History  Hx melanoma   DISCHARGE EXAM Blood pressure 127/48, pulse 76, temperature 97.6 F (36.4 C), temperature source Oral, resp. rate 18, height 5\' 8"  (1.727 m), weight 109.3 kg (240 lb 15.4 oz), SpO2 96 %. General - Well nourished, well developed, in no apparent distress.  Ophthalmologic - not able to see through.  Cardiovascular - Irregularly irregular rate and rhythm   Mental Status -  Level of arousal and orientation to place, and person were intact, but not to time. Language including mild expressive aphasia and difficulty with naming but intact repetition and comprehension.  Cranial Nerves II - XII - II - Visual field intact OU. III, IV, VI - Extraocular movements intact. V - Facial sensation intact bilaterally. VII - right facial droop. VIII - Hearing & vestibular intact bilaterally. X - Palate elevates symmetrically. XI - Chin turning & shoulder shrug intact bilaterally. XII - Tongue protrusion intact.  Motor Strength - The patient's strength was right UE 4-/5 with drift and right LE 4/5 with mild drift, LUE and LLE 5/5. Bulk was normal and fasciculations were absent.  Motor Tone - Muscle tone was assessed at the neck and appendages and was normal.  Reflexes - The patient's reflexes were decreased in all extremities and he had right babinski.  Sensory - Light touch, temperature/pinprick were assessed and were normal.   Coordination - The patient had normal movements in the hands with no ataxia or dysmetria. Tremor was absent.  Gait and Station - not tested.  Discharge Diet   DIET DYS 3 thin  liquids  DISCHARGE PLAN  Disposition:  Discharge to skilled nursing facility for ongoing PT, OT and ST   eliquis (apixaban) for secondary stroke prevention.  Follow-up Kirk Ruths., MD in 2 weeks or MD at [facility]  Keep the coude catheter for now. Follow-up urologist in [place] if not seen by urology prior to discharge  Follow up with Richardson Dopp, PA with Dr. Caryl Comes 06/22/2014 at 2p  Follow-up with Dr. Rosalin Hawking in 2 months in stroke clinic.  40 minutes were spent preparing discharge.  Burnetta Sabin, MSN, APRN, ANVP-BC, AGPCNP-BC [facility] Pager: [phone] 05/25/2014 4:09 PM

## 2014-05-25 NOTE — Progress Notes (Signed)
Pt had 5 beat run V-tach, pt asleep upon checking.  MD paged. Will monitor.

## 2014-05-25 NOTE — Progress Notes (Signed)
Speech Language Pathology Treatment: Dysphagia;Cognitive-Linquistic  Patient Details Name: Austin Warren MRN: [id number] DOB: 1941-01-25 Today's Date: 05/25/2014 Time: 1035-1100 SLP Time Calculation (min) (ACUTE ONLY): 25 min  Assessment / Plan / Recommendation Clinical Impression  Pt is tolerating dysphagia 3 (mechanical soft) diet with thin liquids. He demonstrated wet vocal quality upon entering the room and throughout the session. Due to apraxia, pt was unable to follow commands for throat clear or cough despite max verbal and visual cueing provided by SLP, but showed no other signs of aspiration/penetration.   SLP provided a variety of verbal and written cues for word finding. Pt was inconsistently responsive to written semantic cues provided by SLP. MIT was effective in facilitating verbal output (wife's name, short phrase). Recommend continued speech therapy to focus on facilitating expressive language abilities. SLP will continue to follow.    HPI HPI: Austin Warren is an 73 y.o. male who arrived via EMS as a code stroke with left gaze preference, right facial droop, right sided plegia. Patient was administered IV TPA followed by cerebral angio in IR where he was found to have a T occlusion with complete revascularization of Lt ICA terminus, Lt MCA and Lt ACA with 12mg  of IA TPA. MRI shows areas of acute infarction remain following revascularization of the LEFT ICA, ACA, and MCA, involving the medial LEFT temporal lobe uncus and LEFT basal ganglia. CXR shows cardiac enlargement with increasing pulmonary vascular congestion   Pertinent Vitals Pain Assessment: No/denies pain  SLP Plan  Continue with current plan of care    Recommendations Diet recommendations: Dysphagia 3 (mechanical soft);Thin liquid Liquids provided via: Cup;No straw Medication Administration: Whole meds with liquid Supervision: Patient able to self feed;Staff to assist with self feeding;Full supervision/cueing  for compensatory strategies Compensations: Slow rate;Small sips/bites;Check for pocketing;Multiple dry swallows after each bite/sip Postural Changes and/or Swallow Maneuvers: Seated upright 90 degrees;Upright 30-60 min after meal              General recommendations: Rehab consult Oral Care Recommendations: Oral care BID Follow up Recommendations: Inpatient Rehab Plan: Continue with current plan of care    GO     [name] 05/25/2014, 12:16 PM

## 2014-05-25 NOTE — Plan of Care (Signed)
Problem: SLP Language Goals Goal: Patient will communicate needs/wants with Outcome: Progressing

## 2014-05-25 NOTE — Progress Notes (Signed)
Patient Name: Austin Warren Date of Encounter: 05/25/2014  Primary Cardiologist: Dr. Caryl Comes   Principal Problem:   CVA (cerebral vascular accident) Active Problems:   Ventricular tachycardia   Morbid obesity   Obesity (BMI 30-39.9)   Atrial fibrillation, new onset   Essential hypertension   Diabetes mellitus   Acute diastolic CHF (congestive heart failure)    SUBJECTIVE  Denies any CP or SOB. No Cardiac awareness of new onset atrial fibrillation  CURRENT MEDS .  stroke: mapping our early stages of recovery book   Does not apply Once  . apixaban  5 mg Oral BID  . atorvastatin  20 mg Oral q1800  . carvedilol  6.25 mg Oral BID WC  . finasteride  5 mg Oral Daily  . insulin aspart  0-15 Units Subcutaneous TID WC  . lisinopril  5 mg Oral Daily  . metFORMIN  500 mg Oral BID WC  . multivitamin with minerals  1 tablet Oral Daily  . pantoprazole  40 mg Oral Daily  . potassium chloride  20 mEq Oral Daily  . tamsulosin  0.4 mg Oral Daily  . torsemide  10 mg Oral Daily  . verapamil  180 mg Oral QHS    OBJECTIVE  Filed Vitals:   05/24/14 2148 05/25/14 0138 05/25/14 0638 05/25/14 1021  BP: 116/66 134/66 127/79 138/64  Pulse: 63 73 64 62  Temp: 98.2 F (36.8 C) 98.1 F (36.7 C) 97.9 F (36.6 C) 98.8 F (37.1 C)  TempSrc: Oral Oral Oral Oral  Resp: 22 20 20 21   Height:      Weight:      SpO2: 95% 97% 100% 100%    Intake/Output Summary (Last 24 hours) at 05/25/14 1129 Last data filed at 05/25/14 2409  Gross per 24 hour  Intake      0 ml  Output    750 ml  Net   -750 ml   Filed Weights   05/18/14 1500 05/18/14 1525 05/18/14 2000  Weight: 240 lb 8.4 oz (109.1 kg) 235 lb 14.3 oz (107 kg) 240 lb 15.4 oz (109.3 kg)    PHYSICAL EXAM  General: Pleasant, NAD. Neuro: Alert and oriented X 3. Moves all extremities spontaneously. Psych: Normal affect. HEENT:  Normal  Neck: Supple without bruits or JVD. Lungs:  Resp regular and unlabored, CTA. Heart: irregular no  s3, s4, or murmurs. Abdomen: Soft, non-tender, non-distended, BS + x 4.  Extremities: No clubbing, cyanosis or edema. DP/PT/Radials 2+ and equal bilaterally.  Accessory Clinical Findings  Basic Metabolic Panel  Recent Labs  05/23/14 0625  NA 140  K 3.8  CL 102  CO2 21  GLUCOSE 120*  BUN 22  CREATININE 0.62  CALCIUM 9.0    TELE A-fib with HR 60-80s, no significant ventricular ectopy    ECG - No new ECG in last 24 hours.  Echocardiogram - 05/19/2014  Study Conclusions  - Left ventricle: The cavity size was mildly dilated. Wall thickness was increased in a pattern of moderate LVH. There was mild concentric hypertrophy. Systolic function was mildly reduced. The estimated ejection fraction was in the range of 45% to 50%. There is hypokinesis of the inferior and apical myocardium. Doppler parameters are consistent with high ventricular filling pressure. - Mitral valve: Calcified annulus. Mildly thickened leaflets . There was mild regurgitation. - Left atrium: The atrium was mildly dilated. - Right ventricle: The cavity size was at the upper limits of normal. Wall thickness was mildly increased. - Right  atrium: The atrium was mildly dilated. - Pulmonary arteries: Systolic pressure was mildly to moderately increased. PA peak pressure: 47 mm Hg (S).  Impressions:  - No cardiac source of emboli was indentified.    Radiology/Studies  Ct Head Wo Contrast - 05/18/2014   CLINICAL DATA:  Followup evaluation status post interventional procedure  EXAM: CT HEAD WITHOUT CONTRAST  TECHNIQUE: Contiguous axial images were obtained from the base of the skull through the vertex without intravenous contrast.  COMPARISON:  CT brain 06/07/2014  FINDINGS: Ventricles and sulci are prominent, compatible with atrophy. No definite evidence for acute intracranial hemorrhage, mass lesion or significant mass effect. Increased density along the falx and tentorium favored to  represent sequelae of contrast administration. Minimal hypodensity within the left basal ganglia region however relative preservation of the gray-white differentiation within the left MCA territory. Orbits are unremarkable. Ethmoid sinus mucosal thickening. Mastoid air cells are unremarkable. Calvarium is intact.  IMPRESSION: Suggestion of minimal hypodensity within the left basal ganglia region possibly representing age indeterminate ischemic change. Overall however, there is relative preservation of the gray-white differentiation within the left MCA territory without suggestion of a large infarct visible at this time.  Increased density along the falx and tentorium is favored to represent sequelae of contrast administration.   Electronically Signed   By: Lovey Newcomer M.D.   On: 05/18/2014 19:37     Mr Brain Wo Contrast  05/19/2014   CLINICAL DATA:  LEFT MCA infarct status post IV tPA and mechanical revascularization. Embolus secondary to newly diagnosed atrial fibrillation. RIGHT hemiparesis. Initial encounter.  EXAM: MRI HEAD WITHOUT CONTRAST  TECHNIQUE: Multiplanar, multiecho pulse sequences of the brain and surrounding structures were obtained without intravenous contrast.  COMPARISON:  CT head 05/18/2014.  FINDINGS: There is restricted diffusion in the LEFT basal ganglia, LEFT periventricular white matter, and medial LEFT temporal lobe (uncus) consistent with areas of acute infarction related to LEFT MCA occlusion with subsequent revascularization. No insular, frontal, lateral temporal, or parietal cortical infarction. No RIGHT hemisphere, brainstem or cerebellar acute infarction.  Generalized atrophy. Mild white matter signal abnormality, probable chronic microvascular ischemic change. No hemorrhagic transformation. Flow voids proximally have been reestablished including LEFT MCA M1 segment. No midline shift. Calvarium intact. Minor sinus fluid.  IMPRESSION: Areas of acute infarction remain following  revascularization of the LEFT ICA, ACA, and MCA, involving the medial LEFT temporal lobe uncus and LEFT basal ganglia. No hemorrhagic transformation. No other areas of convexity cortical infarction are observed.  Atrophy and small vessel disease, similar to priors.   Electronically Signed   By: Rolla Flatten M.D.   On: 05/19/2014 15:42    Ir Thrombect Prim Mech Init (inclu) Mod Sed - 05/21/2014   CLINICAL DATA:  Global aphasia, right-sided weakness, left sided gaze deviation.  EXAM: IR ANGIO INTRA EXTRACRAN SEL COM CAROTID INNOMINATE UNI RIGHT MOD SED BILATERAL COMMON CAROTID ARTERIOGRAMS FOLLOWED BY ENDOVASCULAR COMPLETE REVASCULARIZATION OF OCCLUDED LEFT INTERNAL CAROTID ARTERY TERMINUS, LEFT MIDDLE CEREBRAL ARTERY AND LEFT ANTERIOR CEREBRAL ARTERY USING SUPERSELECTIVE INTRACRANIAL tPA AND MECHANICAL THROMBECTOMY:  ANESTHESIA/SEDATION: General anesthesia.  MEDICATIONS: As per general anesthesia.  CONTRAST:  58mL OMNIPAQUE IOHEXOL 300 MG/ML  SOLN  PROCEDURE: Following a full explanation of the procedure along with the potential associated complications, an informed witnessed consent was obtained. The patient was put under general anesthesia by the Department of Anesthesiology at [facility].  The right groin was prepped and draped in the usual sterile fashion. Thereafter using a modified Seldinger technique,  transfemoral access into the right common femoral artery was obtained without difficulty. Over a 0.035 inch guidewire, a 5 French Pinnacle sheath was inserted. Through this, and also over a 0.035 inch guidewire, a 5 French JB1 catheter was advanced to the aortic arch region and selectively positioned in the left common carotid artery and the right common carotid artery.  There were no acute complications. The patient tolerated the procedure well.  COMPLICATIONS: None immediate.  FINDINGS: The left common carotid arteriogram demonstrates the left external carotid artery and its major branches to be  normal.  The left internal carotid artery demonstrates wide patency proximally.  More distally there is non visualization of the left internal carotid artery in the distal cervical region.  No distal reconstitution of the intracranial vasculature from the external carotid artery branches is seen.  ENDOVASCULAR COMPLETE REVASCULARIZATION OF OCCLUDED LEFT INTERNAL CAROTID ARTERY TERMINUS, THE LEFT MIDDLE CEREBRAL ARTERY AND THE LEFT ANTERIOR CEREBRAL ARTERY USING SUPER SELECTIVE INTRACRANIAL INTRA-ARTERIAL 12 MG OF TPA AND TWO PASSES WITH THE MECHANICAL THROMBECTOMY DEVICE:  The angiographic findings were reviewed with the referring neurologist.  The option of endovascular revascularization to prevent further neurological injury, and to potentially improve outcomes was discussed with the patient's family. Risks of intracranial hemorrhage of 10-15%, worsening neurological deficit, need for ventilator dependency, death, and inability to revascularize were all discussed in detail with the patient's family. Informed consent was obtained.  The diagnostic JB1 catheter in the left common carotid artery was then exchanged over a 0.035 inch 300 cm Rosen exchange guidewire for a 8 French 55 cm Brite Tip neurovascular sheath using biplane roadmap technique and constant fluoroscopic guidance. Good aspiration was obtained from the side port of the neurovascular sheath. This was then connected to continuous heparinized saline infusion.  Over the exchange guidewire, a 90 cm 8 [place] FlowGate balloon guide catheter which had been prepped and purged with 50% contrast and 50% heparinized saline infusion was then advanced and positioned just proximal to the left common carotid bifurcation.  Good aspiration was obtained from the hub of the 8 French balloon guide catheter.  A gentle constant injection demonstrated no evidence of spasm, dissections or of intraluminal filling defects.  At this time in a coaxial manner and with constant  heparinized saline infusion, over a 0.035 inch Roadrunner guidewire, the 8 French balloon guide catheter was then advanced to the junction of the middle and the proximal one-third of the left internal carotid artery. Good aspiration was obtained from the hub of the 8 [place] FlowGate guide catheter.  A gentle constant injection demonstrated no evidence of spasms. 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  At this time in a coaxial manner and with constant heparinized saline infusion using biplane roadmap technique and constant fluoroscopic guidance, a combination of Trevo Provue micro catheter inside of a DAC 125 catheter was advanced over a 0.014 inch Softip Synchro micro guidewire to the distal end of the 8 [place] FlowGate guide catheter, wiith the micro guidewire leading with a J-tip configuration to avoid dissections or inducing spasm, to the distal petrous segment of the left internal carotid artery.  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  Again the combination was then navigated with the micro guidewire leading into the supraclinoid left ICA and then advanced without difficulty into the M3 region of the inferior division of the left middle cerebral artery  followed by the combination of the Trevo Provue micro catheter and the [facility] 125 catheter.  The micro guidewire was removed. Good aspiration was obtained from the hub of the micro catheter. A gentle constant injection demonstrated antegrade but slow flow.  At this time approximately 7 mg of super selective intracranial intra-arterial tPA was then infused over about 7 minutes and 7 cc of heparinized saline. The micro catheter was retrieved somewhat more proximally in order to perfuse the clot.  At the end of this good aspiration was obtained from the hub of the micro catheter. A gentle constant injection  demonstrated again improved antegrade flow in the tertiary divisions.  At this time a Trevo Provue 4 mm x 30 mm stent retrieval device was then advanced in a coaxial manner and with constant heparinized saline infusion using biplane roadmap technique and constant fluoroscopic guidance to the distal end of the micro catheter.  The O rings on the delivery micro guidewire and delivery micro catheter were then released.  With slight forward gentle traction with the right hand on the delivery micro guidewire, with the left hand the delivery micro catheter was then retrieved until the proximal portion of the retrieval device had been deployed.  The device was left deployed for approximately 3 minutes.  At this time, the balloon at the distal end of the [facility] guide catheter was then inflated for proximal flow arrest. The proximal portion of the retrieval device was then captured into the micro catheter. With constant aspiration being applied at side port of the 8 [place] guide catheter using a 60 mL syringe, the combination of the retrieval device, the micro catheter and the [facility] 125 micro catheter were then removed as aspiration was continued. The aspirate was continued as the balloon was deflated in the left internal carotid artery.  The aspirate itself demonstrated no significant amount of clot.  A control arteriogram performed through the 8 [place] FlowGate guide catheter demonstrated the continued occlusion of the left internal carotid artery at the terminus without opacification of the middle or the anterior cerebral arteries.  At this time, a second pass was made with the combination of the Trevo Provue micro catheter inside of a DAC 125 catheter over a 0.014 inch Softip Synchro micro guidewire with a J-tip configuration. The combination was again advanced into the inferior division of the left middle cerebral artery in the M3 region as detailed above.  Approximately 5 cc of super selective intracranial arterial tPA  was then infused over about 5 minutes and 5 cc of normal saline. At this time a 4 mm x 40 mm solitaire FR stent retrieval device which had been purged with heparinized saline infusion in its housing was then advanced in a coaxial manner and with constant heparinized saline infusion using biplane roadmap technique and constant fluoroscopic guidance to the distal end of the Trevo Provue micro catheter. The entire system was then straightened.  The O rings on the delivery micro guidewire and the delivery micro catheter were then released. Again with slight forward gentle pressure with the right hand on the stent retrieval wire, with the left hand the delivery micro catheter was retrieved unsheathing the device until the tip of the micro catheter just proximal to the proximal marker on the retrieval device. This was left deployed for approximately 3 minutes.  A control arteriogram performed through the 8 [place] FlowGate catheter in the left internal carotid artery demonstrated antegrade flow through the occluded left internal carotid artery, the left internal  carotid artery terminus, the left middle and the left anterior cerebral arteries. Again copious amounts of filling defects were seen in the supraclinoid segment of the left middle cerebral artery and left anterior cerebral artery consistent with recannulized clot.  The balloon was then advanced into the junction of the middle and the proximal one-third of the left internal carotid artery. This was then inflated for proximal flow arrest. The proximal portion of the retrieval device was then captured into the distal micro catheter. With constant aspiration using a 60 mL syringe at the side port of the 8 [place] guide catheter, the combination of the retrieval device, the micro catheter and the [facility] micro catheter was then performed under constant supervision.  Aspiration was continued as the balloon was then deflated.  Back bleed was allowed at the hub of the 8 [place]  FlowGate guide catheter.  The aspirate contained copious amounts of clots with a different density.  A control arteriogram performed through the 8 [place] FlowGate guide catheter demonstrated complete angiographic revascularization of the left internal carotid artery supraclinoid segment of the left and middle cerebral artery and left anterior cerebral artery.  No angiographic filling defects were seen or extravasation or of blush was seen in the left middle cerebral artery or anterior cerebral artery distribution.  The hemodynamic status and neurological status remained stable throughout the procedure.  The 8 [place] FlowGate guide catheter and the 8 [place] neurovascular sheath were then retrieved into the abdominal aorta and exchanged over a J-tip guidewire for a 9 [place] Pinnacle sheath. This was then connected to continuous heparinized saline infusion.  The diagnostic JB1 catheter was then advanced into right common carotid artery. A control arteriogram performed demonstrated the right external carotid artery and its major branches to be normal. The right internal carotid artery at the bulb to the cranial skull base also opacified normally.  The petrous, cavernous and supraclinoid segments are widely patent.  The right middle and the right anterior cerebral arteries are seen to opacify with suggestion of approximately 50% narrowing at the origin of the right middle cerebral artery probably due to intracranial atherosclerosis.  The patient was then transported to the CT scanner for postprocedural CT scan of the brain.  IMPRESSION: Status post endovascular complete revascularization of occluded left internal carotid artery terminus, the left middle cerebral artery and the left anterior cerebral artery achieving a TICI 3, using approximately 12 mg of superselective intracranial intra-arterial tPA with one pass with the Trevo Provue stent retrieval device 4 x 30, and a 4 x 40 mm solitaire FR stent retrieval device as  described above.  Approximately 50% narrowing of the right middle cerebral artery proximally.   Electronically Signed   By: Luanne Bras M.D.   On: 05/20/2014 10:09   Dg Chest Port 1 View - 05/20/2014   CLINICAL DATA:  Pulmonary edema.  Shortness of breath.  EXAM: PORTABLE CHEST - 1 VIEW  COMPARISON:  05/19/2014  FINDINGS: Interval removal of endotracheal tube. Shallow inspiration. Cardiac enlargement with pulmonary vascular congestion and perihilar airspace and interstitial edema. Changes are increasing since prior study. Probable small bilateral pleural effusions. No pneumothorax. Calcified and tortuous aorta.  IMPRESSION: Cardiac enlargement with increasing pulmonary vascular congestion and perihilar edema since previous study. Probable small bilateral pleural effusions.   Electronically Signed   By: Lucienne Capers M.D.   On: 05/20/2014 01:51     ASSESSMENT AND PLAN 1. Left MCA infarct - s/p IV TPA and complete revascularization of Lt ICA  terminus, Lt MCA and Lt ACA with 12mg  of IA TPA, 1 pass with Trevoprovue, and 1 pass with Solitaire withTICI 3 revascularization.  - Managed by Neurology - Started Eliqius 5mg  BID on 05/24/2014.  2. New onset atrial fibrillation/a-flutter - CHA2DS2-Vasc score: 7 (LV dysfunct, HTN, age, DM, stroke) - Continue Coreg 6.25mg  BID and Verapamil 180mg  daily for rate control. - Started Eliquis 5mg  BID on 05/24/2014 for anticoagulation. - no new recommendations. Patient is rate controlled, continue in a-fib, would not cardiovert given recent stroke. Need chronic systemic anticoagulation. Can potentially consider cardioversion after 3 weeks. Will arrange followup  3. History of verapamil sensitive VT: -  Both beta blocker and calcium channel blocker per Dr. Olin Pia last note August 2015.  - Occasional PVCs on tele.  4. Cardiomyopathy:  - Cath Apr 2012 normal coronaries - EF in 11/2011 was 50-55% with no wall motion abnormalities. - Echo on 05/19/2014  showed EF 45-50% with wall motion abnormalities. (inferior and apical hypokinesis) - No further ischemic testing right now. Troponin I negative.  - Continue Coreg and Torsemide. - Started Lisinopril 5mg  daily on 05/25/2014.  5. HLD - LDL 58 on 05/19/2014 - Continue Lipitor 40mg  daily.  Hilbert Corrigan PA-C Pager: [phone]  Patient examned chart reviewed Agree with above  Continue rate control and anticoagulation  CVA rehab Will arrange f/u with Dr Caryl Comes  Will sign off  Jenkins Rouge

## 2014-05-27 ENCOUNTER — Encounter (HOSPITAL_COMMUNITY): Payer: Self-pay | Admitting: General Practice

## 2014-05-27 ENCOUNTER — Inpatient Hospital Stay (HOSPITAL_COMMUNITY)
Admission: EM | Admit: 2014-05-27 | Discharge: 2014-05-31 | DRG: 871 | Disposition: A | Discharge status: Skilled Nursing Facility | Payer: Medicare Other | Source: Other Acute Inpatient Hospital | Attending: Internal Medicine | Admitting: Internal Medicine

## 2014-05-27 ENCOUNTER — Emergency Department: Payer: Self-pay | Admitting: Internal Medicine

## 2014-05-27 ENCOUNTER — Telehealth: Payer: Self-pay | Admitting: *Deleted

## 2014-05-27 DIAGNOSIS — I639 Cerebral infarction, unspecified: Secondary | ICD-10-CM | POA: Diagnosis present

## 2014-05-27 DIAGNOSIS — G9340 Encephalopathy, unspecified: Secondary | ICD-10-CM

## 2014-05-27 DIAGNOSIS — N401 Enlarged prostate with lower urinary tract symptoms: Secondary | ICD-10-CM | POA: Diagnosis present

## 2014-05-27 DIAGNOSIS — I10 Essential (primary) hypertension: Secondary | ICD-10-CM | POA: Diagnosis present

## 2014-05-27 DIAGNOSIS — N390 Urinary tract infection, site not specified: Secondary | ICD-10-CM | POA: Diagnosis present

## 2014-05-27 DIAGNOSIS — E785 Hyperlipidemia, unspecified: Secondary | ICD-10-CM | POA: Diagnosis present

## 2014-05-27 DIAGNOSIS — I472 Ventricular tachycardia: Secondary | ICD-10-CM | POA: Diagnosis present

## 2014-05-27 DIAGNOSIS — G92 Toxic encephalopathy: Secondary | ICD-10-CM | POA: Diagnosis present

## 2014-05-27 DIAGNOSIS — A419 Sepsis, unspecified organism: Secondary | ICD-10-CM

## 2014-05-27 DIAGNOSIS — Z8673 Personal history of transient ischemic attack (TIA), and cerebral infarction without residual deficits: Secondary | ICD-10-CM

## 2014-05-27 DIAGNOSIS — I5031 Acute diastolic (congestive) heart failure: Secondary | ICD-10-CM | POA: Diagnosis present

## 2014-05-27 DIAGNOSIS — E1159 Type 2 diabetes mellitus with other circulatory complications: Secondary | ICD-10-CM

## 2014-05-27 DIAGNOSIS — E119 Type 2 diabetes mellitus without complications: Secondary | ICD-10-CM

## 2014-05-27 DIAGNOSIS — R338 Other retention of urine: Secondary | ICD-10-CM | POA: Diagnosis present

## 2014-05-27 DIAGNOSIS — K649 Unspecified hemorrhoids: Secondary | ICD-10-CM | POA: Diagnosis not present

## 2014-05-27 DIAGNOSIS — I4891 Unspecified atrial fibrillation: Secondary | ICD-10-CM | POA: Diagnosis present

## 2014-05-27 DIAGNOSIS — R001 Bradycardia, unspecified: Secondary | ICD-10-CM | POA: Diagnosis not present

## 2014-05-27 DIAGNOSIS — E876 Hypokalemia: Secondary | ICD-10-CM | POA: Diagnosis present

## 2014-05-27 DIAGNOSIS — B356 Tinea cruris: Secondary | ICD-10-CM | POA: Diagnosis present

## 2014-05-27 HISTORY — DX: Cerebral infarction, unspecified: I63.9

## 2014-05-27 HISTORY — DX: Urinary tract infection, site not specified: N39.0

## 2014-05-27 LAB — URINALYSIS, COMPLETE
BILIRUBIN, UR: NEGATIVE
Glucose,UR: NEGATIVE mg/dL (ref 0–75)
Nitrite: NEGATIVE
PH: 5 (ref 4.5–8.0)
RBC,UR: 824 /HPF (ref 0–5)
Specific Gravity: 1.027 (ref 1.003–1.030)
Squamous Epithelial: <1
WBC UR: 113 /HPF (ref 0–5)

## 2014-05-27 LAB — CBC WITH DIFFERENTIAL/PLATELET
BASOS ABS: 0.1 10*3/uL (ref 0.0–0.1)
BASOS PCT: 0.6 %
EOS PCT: 0.3 %
Eosinophil #: 0 10*3/uL (ref 0.0–0.7)
HCT: 44.9 % (ref 40.0–52.0)
HGB: 15.1 g/dL (ref 13.0–18.0)
LYMPHS ABS: 0.3 10*3/uL — AB (ref 1.0–3.6)
LYMPHS PCT: 2.3 %
MCH: 32.7 pg (ref 26.0–34.0)
MCHC: 33.8 g/dL (ref 32.0–36.0)
MCV: 97 fL (ref 80–100)
MONO ABS: 0.9 x10 3/mm (ref 0.2–1.0)
Monocyte %: 7.4 %
NEUTROS PCT: 89.4 %
Neutrophil #: 11 10*3/uL — ABNORMAL HIGH (ref 1.4–6.5)
Platelet: 195 10*3/uL (ref 150–440)
RBC: 4.63 10*6/uL (ref 4.40–5.90)
RDW: 13.1 % (ref 11.5–14.5)
WBC: 12.3 10*3/uL — ABNORMAL HIGH (ref 3.8–10.6)

## 2014-05-27 LAB — COMPREHENSIVE METABOLIC PANEL
ALBUMIN: 3.4 g/dL (ref 3.4–5.0)
ALK PHOS: 99 U/L
ALT: 43 U/L
Anion Gap: 10 (ref 7–16)
BILIRUBIN TOTAL: 1.6 mg/dL — AB (ref 0.2–1.0)
BUN: 17 mg/dL (ref 7–18)
CO2: 21 mmol/L (ref 21–32)
Calcium, Total: 9 mg/dL (ref 8.5–10.1)
Chloride: 106 mmol/L (ref 98–107)
Creatinine: 0.84 mg/dL (ref 0.60–1.30)
EGFR (African American): >60
EGFR (Non-African Amer.): >60
Glucose: 150 mg/dL — ABNORMAL HIGH (ref 65–99)
OSMOLALITY: 278 (ref 275–301)
Potassium: 3.9 mmol/L (ref 3.5–5.1)
SGOT(AST): 40 U/L — ABNORMAL HIGH (ref 15–37)
SODIUM: 137 mmol/L (ref 136–145)
Total Protein: 7.3 g/dL (ref 6.4–8.2)

## 2014-05-27 LAB — GLUCOSE, CAPILLARY
Glucose-Capillary: 173 mg/dL — ABNORMAL HIGH (ref 70–99)
Glucose-Capillary: 123 mg/dL — ABNORMAL HIGH (ref 70–99)

## 2014-05-27 LAB — PROTIME-INR
INR: 1.3
PROTHROMBIN TIME: 16.3 s — AB (ref 11.5–14.7)

## 2014-05-27 LAB — MRSA PCR SCREENING: MRSA by PCR: NEGATIVE

## 2014-05-27 LAB — MAGNESIUM: Magnesium: 1.6 mg/dL — ABNORMAL LOW

## 2014-05-27 LAB — TROPONIN I: Troponin-I: 0.03 ng/mL

## 2014-05-27 LAB — PHOSPHORUS: PHOSPHORUS: 3.2 mg/dL (ref 2.5–4.9)

## 2014-05-27 LAB — CK TOTAL AND CKMB (NOT AT ARMC)
CK, TOTAL: 47 U/L
CK-MB: <0.5 ng/mL — ABNORMAL LOW (ref 0.5–3.6)

## 2014-05-27 MED ORDER — FLUCONAZOLE 100 MG PO TABS
100.0000 mg | ORAL_TABLET | Freq: Every day | ORAL | Status: AC
  Administered 2014-05-27 – 2014-05-29 (×3): 100 mg via ORAL
  Filled 2014-05-27 (×3): qty 1

## 2014-05-27 MED ORDER — VITAMIN C 500 MG PO TABS
500.0000 mg | ORAL_TABLET | Freq: Every day | ORAL | Status: DC
  Administered 2014-05-27 – 2014-05-31 (×5): 500 mg via ORAL
  Filled 2014-05-27 (×5): qty 1

## 2014-05-27 MED ORDER — SODIUM CHLORIDE 0.9 % IJ SOLN
3.0000 mL | Freq: Two times a day (BID) | INTRAMUSCULAR | Status: DC
  Administered 2014-05-27 – 2014-05-30 (×4): 3 mL via INTRAVENOUS

## 2014-05-27 MED ORDER — INSULIN ASPART 100 UNIT/ML ~~LOC~~ SOLN
0.0000 [IU] | Freq: Three times a day (TID) | SUBCUTANEOUS | Status: DC
Start: 2014-05-27 — End: 2014-05-31
  Administered 2014-05-27: 3 [IU] via SUBCUTANEOUS
  Administered 2014-05-28 – 2014-05-29 (×5): 2 [IU] via SUBCUTANEOUS

## 2014-05-27 MED ORDER — CARVEDILOL 6.25 MG PO TABS
6.2500 mg | ORAL_TABLET | Freq: Two times a day (BID) | ORAL | Status: DC
  Administered 2014-05-27 – 2014-05-28 (×2): 6.25 mg via ORAL
  Filled 2014-05-27 (×4): qty 1

## 2014-05-27 MED ORDER — ACETAMINOPHEN 325 MG PO TABS: 650.0000 mg | ORAL_TABLET | Freq: Four times a day (QID) | ORAL | Status: DC | PRN

## 2014-05-27 MED ORDER — ALBUTEROL SULFATE (2.5 MG/3ML) 0.083% IN NEBU: 2.5000 mg | INHALATION_SOLUTION | RESPIRATORY_TRACT | Status: DC | PRN

## 2014-05-27 MED ORDER — VERAPAMIL HCL ER 180 MG PO TBCR
180.0000 mg | EXTENDED_RELEASE_TABLET | Freq: Every day | ORAL | Status: DC
  Administered 2014-05-27 – 2014-05-28 (×2): 180 mg via ORAL
  Filled 2014-05-27 (×3): qty 1

## 2014-05-27 MED ORDER — ATORVASTATIN CALCIUM 20 MG PO TABS
20.0000 mg | ORAL_TABLET | Freq: Every day | ORAL | Status: DC
  Administered 2014-05-27 – 2014-05-30 (×4): 20 mg via ORAL
  Filled 2014-05-27 (×5): qty 1

## 2014-05-27 MED ORDER — ACETAMINOPHEN 650 MG RE SUPP: 650.0000 mg | Freq: Four times a day (QID) | RECTAL | Status: DC | PRN

## 2014-05-27 MED ORDER — PIPERACILLIN-TAZOBACTAM 3.375 G IVPB
3.3750 g | Freq: Three times a day (TID) | INTRAVENOUS | Status: DC
  Administered 2014-05-27 – 2014-05-29 (×6): 3.375 g via INTRAVENOUS
  Filled 2014-05-27 (×8): qty 50

## 2014-05-27 MED ORDER — TAMSULOSIN HCL 0.4 MG PO CAPS
0.4000 mg | ORAL_CAPSULE | Freq: Every day | ORAL | Status: DC
  Administered 2014-05-28 – 2014-05-31 (×4): 0.4 mg via ORAL
  Filled 2014-05-27 (×5): qty 1

## 2014-05-27 MED ORDER — FINASTERIDE 5 MG PO TABS
5.0000 mg | ORAL_TABLET | Freq: Every day | ORAL | Status: DC
  Administered 2014-05-27 – 2014-05-31 (×5): 5 mg via ORAL
  Filled 2014-05-27 (×5): qty 1

## 2014-05-27 MED ORDER — BACITRACIN-NEOMYCIN-POLYMYXIN OINTMENT TUBE
TOPICAL_OINTMENT | Freq: Three times a day (TID) | CUTANEOUS | Status: DC
  Administered 2014-05-27 – 2014-05-28 (×4): via TOPICAL
  Administered 2014-05-28: 1 via TOPICAL
  Administered 2014-05-29: 23:00:00 via TOPICAL
  Administered 2014-05-30: 1 via TOPICAL
  Administered 2014-05-30 (×2): via TOPICAL
  Filled 2014-05-27: qty 15

## 2014-05-27 MED ORDER — HYDROCODONE-ACETAMINOPHEN 5-325 MG PO TABS: 1.0000 | ORAL_TABLET | ORAL | Status: DC | PRN

## 2014-05-27 MED ORDER — NYSTATIN 100000 UNIT/GM EX POWD
Freq: Three times a day (TID) | CUTANEOUS | Status: DC
  Administered 2014-05-27 – 2014-05-30 (×11): via TOPICAL
  Filled 2014-05-27: qty 15

## 2014-05-27 MED ORDER — APIXABAN 5 MG PO TABS
5.0000 mg | ORAL_TABLET | Freq: Two times a day (BID) | ORAL | Status: DC
Start: 2014-05-27 — End: 2014-05-31
  Administered 2014-05-27 – 2014-05-31 (×8): 5 mg via ORAL
  Filled 2014-05-27 (×9): qty 1

## 2014-05-27 MED ORDER — SODIUM CHLORIDE 0.9 % IV SOLN
INTRAVENOUS | Status: DC
  Administered 2014-05-27 – 2014-05-28 (×2): 75 mL/h via INTRAVENOUS

## 2014-05-27 NOTE — Care Management Note (Addendum)
    Page 1 of 1   05/31/2014     11:19:36 AM CARE MANAGEMENT NOTE 05/31/2014  Patient:  Austin Warren, Austin Warren   Account Number:  [id number]  Date Initiated:  05/27/2014  Documentation initiated by:  Elissa Hefty  Subjective/Objective Assessment:   adm w sepsis     Action/Plan:   from nsg facility   Anticipated DC Date:     Anticipated DC Plan:    In-house referral  Clinical Social Worker         Choice offered to / List presented to:             Status of service:   Medicare Important Message given?  YES (If response is "NO", the following Medicare IM given date fields will be blank) Date Medicare IM given:  05/31/2014 Medicare IM given by:  Elissa Hefty Date Additional Medicare IM given:   Additional Medicare IM given by:    Discharge Disposition:    Per UR Regulation:  Reviewed for med. necessity/level of care/duration of stay  If discussed at [place] Point of Stay Meetings, dates discussed:   06/01/2014    Comments:

## 2014-05-27 NOTE — Progress Notes (Signed)
Pt arrived from [facility] ED per [facility]. Placed in bed CHG bath done swabbed for MRSA oriented to room attached all monitors. Sacral area has stage 1 placed Allevyn foam dressing. Noted open areas x 2 Lt groin and maceration Rt groin and under Scrotum.

## 2014-05-27 NOTE — H&P (Signed)
Patient Demographics  Austin Warren, is a 73 y.o. male  MRN: [id number]   DOB - [birth year]  Admit Date - 05/27/2014  Outpatient Primary MD for the patient is Kirk Ruths., MD   With History of -  Past Medical History  Diagnosis Date  . Hypertension   . Hyperlipidemia   . History of colon polyps   . Benign prostatic hypertrophy   . Arthritis   . Ventricular tachycardia     RBB/LAHB ideopathic VT, 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  . Melanoma     under arm  . Osteoarthritis   . Dyspnea on exertion 11/01/2011  . Stroke 05/2014  . UTI (urinary tract infection)   . Diabetes mellitus     type 2      Past Surgical History  Procedure Laterality Date  . Replacement total knee    . Vasectomy    . Joint replacement      R TKR  . Replacement total knee Left   . Radiology with anesthesia N/A 05/18/2014    Procedure: RADIOLOGY WITH ANESTHESIA;  Surgeon: Rob Hickman, MD;  Location: [place];  Service: Radiology;  Laterality: N/A;    in for   No chief complaint on file.    HPI  Austin Warren  is a 73 y.o. male,  Is being transferred from [facility] ED ,patient is known to have history of DM type 2, V tach, HTN, who was admitted on 05/18/14 with right sided weakness, right facial droop and left gaze preference. CT of head with large dense L-MCA sign and he received IV tPA and underwent cerebral angio with complete revascularization of occlusion of L-ICA terminus, L -MCA and L-ACA recanalization with IA tPA and clot retrieval. Intubated post procedure thorough 11/04. found to have new onset atrial fibrillation, seen by cardiology and started on anticoagulation, patient was discharged on 05/25/14 to skilled nursing facility at [facility]. Over the last 24 hours patient has progressive weakness, confusion, developed fever over 103 , workup was significant for elevated lactic acid, urine tract infection, a shunt has fully catheter since discharge,leukocytosis of 12.3, chest  x-ray was did not show any acute process, patient was started on IV vancomycin and Zosyn, patient had CT head shows evolving L B. Ganglia CVA, survey discussed with neurology as: Dr. Florence Canner, did not feel that this is a new CVA, and patient was transferred to [facility] for further evaluation.     Review of Systems    In addition to the HPI above,  Has fever No Headache, No changes with Vision or hearing, No problems swallowing food or Liquids, No Chest pain, Cough or Shortness of Breath, No Abdominal pain, No Nausea or Vommitting, Bowel movements are regular, No Blood in stool or Urine, No dysuria, No new skin rashes or bruises, No new joints pains-aches,  has new generalized weakness,but nothing focal, no tingling, numbness in any extremity, No recent weight gain or loss, No polyuria, polydypsia or polyphagia, No significant Mental Stressors.  A full 10 point Review of Systems was done, except as stated above, all other Review of Systems were negative.   Social History History  Substance Use Topics  . Smoking status: Former Research scientist (life sciences)  . Smokeless tobacco: Never Used     Comment: 05/2014    quit smoking  45 years ago  . Alcohol Use: 0.0 oz/week    3-4 Cans of beer per week     Comment: prior heavy    Family History Family  History  Problem Relation Age of Onset  . Stroke Father      Prior to Admission medications   Medication Sig Start Date End Date Taking? Authorizing Provider  apixaban (ELIQUIS) 5 MG TABS tablet Take 1 tablet (5 mg total) by mouth 2 (two) times daily. 05/25/14   Donzetta Starch, NP  Ascorbic Acid (VITAMIN C) 500 MG tablet Take 500 mg by mouth daily.      Historical Provider, MD  atorvastatin (LIPITOR) 20 MG tablet Take 1 tablet (20 mg total) by mouth daily at 6 PM. 05/25/14   Donzetta Starch, NP  carvedilol (COREG) 6.25 MG tablet Take 6.25 mg by mouth 2 (two) times daily with a meal.    Historical Provider, MD  Cholecalciferol (VITAMIN D3) 1000 UNITS CAPS  Take 1,000 Units by mouth daily.     Historical Provider, MD  finasteride (PROSCAR) 5 MG tablet Take 5 mg by mouth daily.      Historical Provider, MD  lisinopril (PRINIVIL,ZESTRIL) 5 MG tablet Take 1 tablet (5 mg total) by mouth daily. 05/25/14   Donzetta Starch, NP  metFORMIN (GLUCOPHAGE) 500 MG tablet Take 500 mg by mouth 2 (two) times daily with a meal.      Historical Provider, MD  Multiple Vitamin (MULTIVITAMIN WITH MINERALS) TABS tablet Take 1 tablet by mouth daily. One A Day 50+    Historical Provider, MD  Tamsulosin HCl (FLOMAX) 0.4 MG CAPS Take 0.4 mg by mouth daily after breakfast.     Historical Provider, MD  torsemide (DEMADEX) 10 MG tablet Take 10 mg by mouth daily.  02/17/13   Historical Provider, MD  verapamil (CALAN-SR) 180 MG CR tablet Take 180 mg by mouth at bedtime.    Historical Provider, MD    Allergies  Allergen Reactions  . Ciprofloxacin Anaphylaxis  . Levofloxacin Anaphylaxis    "throat closes"  . Sulfa Antibiotics Rash    Physical Exam  Vitals  Blood pressure 117/59, pulse 66, temperature 98.2 F (36.8 C), temperature source Oral, resp. rate 25, height 5\' 7"  (1.702 m), weight 99.1 kg (218 lb 7.6 oz), SpO2 100 %.   1. General frail elderly male lying in bed in NAD,    2. Lethargic but communicative, Not Suicidal or Homicidal, Awake Alert, Oriented X 2.  3. Cranial nerves grossly intact, but has generalized weakness, no significant weakness in upper extremities, was weakness in lower extremities but more pronounced in the right side.  4. Ears and Eyes appear Normal, Conjunctivae clear, PERRLA. dry Oral Mucosa.  5. Supple Neck, No JVD, No cervical lymphadenopathy appriciated, No Carotid Bruits.  6. Symmetrical Chest wall movement, Good air movement bilaterally, CTAB.  7.  No Gallops, Rubs or Murmurs, No Parasternal Heave.  8. Positive Bowel Sounds, Abdomen Soft, No tenderness, No organomegaly appriciated,No rebound -guarding or rigidity.  9.  No Cyanosis,  Normal Skin Turgor, has scrotal and genital area rash.  10. Good muscle tone,  joints appear normal , no effusions, Normal ROM.  11. No Palpable Lymph Nodes in Neck or Axillae   Data Review  CBC  Recent Labs Lab 05/21/14 0148  WBC 10.7*  HGB 14.8  HCT 43.9  PLT 165  MCV 96.9  MCH 32.7  MCHC 33.7  RDW 13.1  LYMPHSABS 1.3  MONOABS 1.2*  EOSABS 0.0  BASOSABS 0.0   ------------------------------------------------------------------------------------------------------------------  Chemistries   Recent Labs Lab 05/21/14 0148 05/23/14 0625  NA 142 140  K 3.2* 3.8  CL 100 102  CO2 24 21  GLUCOSE 125* 120*  BUN 13 22  CREATININE 0.65 0.62  CALCIUM 8.8 9.0   ------------------------------------------------------------------------------------------------------------------ estimated creatinine clearance is 89.5 mL/min (by C-G formula based on Cr of 0.62). ------------------------------------------------------------------------------------------------------------------ No results for input(s): TSH, T4TOTAL, T3FREE, THYROIDAB in the last 72 hours.  Invalid input(s): FREET3   Coagulation profile No results for input(s): INR, PROTIME in the last 168 hours. ------------------------------------------------------------------------------------------------------------------- No results for input(s): DDIMER in the last 72 hours. -------------------------------------------------------------------------------------------------------------------  Cardiac Enzymes No results for input(s): CKMB, TROPONINI, MYOGLOBIN in the last 168 hours.  Invalid input(s): CK ------------------------------------------------------------------------------------------------------------------ Invalid input(s): POCBNP   ---------------------------------------------------------------------------------------------------------------  Urinalysis    Component Value Date/Time   COLORURINE AMBER*  05/22/2014 1908   APPEARANCEUR CLOUDY* 05/22/2014 1908   LABSPEC 1.020 05/22/2014 1908   PHURINE 5.5 05/22/2014 1908   GLUCOSEU NEGATIVE 05/22/2014 1908   HGBUR LARGE* 05/22/2014 [place] NEGATIVE 05/22/2014 1908   KETONESUR 15* 05/22/2014 1908   PROTEINUR 59* 05/22/2014 1908   UROBILINOGEN 1.0 05/22/2014 1908   NITRITE NEGATIVE 05/22/2014 1908   LEUKOCYTESUR SMALL* 05/22/2014 1908    ----------------------------------------------------------------------------------------------------------------  Imaging results:        Assessment & Plan  Principal Problem:   Sepsis Active Problems:   UTI (urinary tract infection)   Acute encephalopathy   Atrial fibrillation, new onset   CVA (cerebral vascular accident)   Essential hypertension   Diabetes mellitus   Acute diastolic CHF (congestive heart failure)    Sepsis -Patient presents with fever, leukocytosis, elevated lactic acid, meets criteria for sepsis, source is most likely related to UTI, will resend urine cultures and blood cultures.  UTI -Patient will be started on IV Zosyn, given his critical condition, which can be downgraded to Rocephin when he is more stable, will resend urine culture, and change Foley catheter.  Acute encephalopathy -Metabolic encephalopathy secondary to sepsis, CT head findings most likely related to his old CVA, but we'll request official neurology consult.  Axial fibrillation -Continue with anticoagulation, rate controlled  CVA -We'll consult neurology team, resume Eliquis, Lipitor.  Hyperlipidemia -Continue with statin  Diabetes mellitus -Hold metformin, start on insulin sliding scale  Hypertension -Resume back on home medication when he is more stable  History of V. Tach -Continue with Coreg  Fungal rash in the groin -Start on nystatin powder and Diflucan   DVT Prophylaxis on Eliquis  AM Labs Ordered, also please review Full Orders  Family Communication:  Admission, patients condition and plan of care including tests being ordered have been discussed with the patient and wife and daughter who indicate understanding and agree with the plan and Code Status.  Code Status full  Condition GUARDED  /critical  Time spent in minutes : 65 minutes    ELGERGAWY, DAWOOD M.D on 05/27/2014 at 4:04 PM  Between 7am to 7pm - Pager - [phone]  After 7pm go to [email or web address] - password [email or web address]  And look for the night coverage person covering me after hours  [facility] Office  [phone]   **Disclaimer: This note may have been dictated with voice recognition software. Similar sounding words can inadvertently be transcribed and this note may contain transcription errors which may not have been corrected upon publication of note.**

## 2014-05-28 DIAGNOSIS — T8351XD Infection and inflammatory reaction due to indwelling urinary catheter, subsequent encounter: Secondary | ICD-10-CM

## 2014-05-28 DIAGNOSIS — N390 Urinary tract infection, site not specified: Secondary | ICD-10-CM

## 2014-05-28 DIAGNOSIS — G9340 Encephalopathy, unspecified: Secondary | ICD-10-CM

## 2014-05-28 LAB — URINE CULTURE
Colony Count: 8000
SPECIAL REQUESTS: NORMAL

## 2014-05-28 LAB — GLUCOSE, CAPILLARY
Glucose-Capillary: 142 mg/dL — ABNORMAL HIGH (ref 70–99)
Glucose-Capillary: 123 mg/dL — ABNORMAL HIGH (ref 70–99)
Glucose-Capillary: 123 mg/dL — ABNORMAL HIGH (ref 70–99)
Glucose-Capillary: 138 mg/dL — ABNORMAL HIGH (ref 70–99)
Glucose-Capillary: 117 mg/dL — ABNORMAL HIGH (ref 70–99)

## 2014-05-28 LAB — BASIC METABOLIC PANEL
Anion gap: 16 — ABNORMAL HIGH (ref 5–15)
BUN: 13 mg/dL (ref 6–23)
CO2: 20 mEq/L (ref 19–32)
Calcium: 8.8 mg/dL (ref 8.4–10.5)
Chloride: 105 mEq/L (ref 96–112)
Creatinine, Ser: 0.74 mg/dL (ref 0.50–1.35)
GFR calc Af Amer: >90 mL/min (ref >90)
GFR, EST NON AFRICAN AMERICAN: 88 mL/min — AB (ref >90)
GLUCOSE: 140 mg/dL — AB (ref 70–99)
Potassium: 4 mEq/L (ref 3.7–5.3)
SODIUM: 141 meq/L (ref 137–147)

## 2014-05-28 LAB — CBC
HCT: 41.4 % (ref 39.0–52.0)
Hemoglobin: 13.8 g/dL (ref 13.0–17.0)
MCH: 32.5 pg (ref 26.0–34.0)
MCHC: 33.3 g/dL (ref 30.0–36.0)
MCV: 97.4 fL (ref 78.0–100.0)
Platelets: 188 10*3/uL (ref 150–400)
RBC: 4.25 MIL/uL (ref 4.22–5.81)
RDW: 13.4 % (ref 11.5–15.5)
WBC: 8.7 10*3/uL (ref 4.0–10.5)

## 2014-05-28 MED ORDER — BETHANECHOL CHLORIDE 5 MG PO TABS
5.0000 mg | ORAL_TABLET | Freq: Four times a day (QID) | ORAL | Status: DC
  Administered 2014-05-28 – 2014-05-29 (×4): 5 mg via ORAL
  Filled 2014-05-28 (×7): qty 1

## 2014-05-28 MED ORDER — CARVEDILOL 3.125 MG PO TABS
3.1250 mg | ORAL_TABLET | Freq: Two times a day (BID) | ORAL | Status: DC
  Administered 2014-05-28 – 2014-05-31 (×6): 3.125 mg via ORAL
  Filled 2014-05-28 (×8): qty 1

## 2014-05-28 NOTE — Progress Notes (Signed)
[facility] TEAM 1 - Stepdown/ICU TEAM Progress Note  Austin Warren YDX:[id number] DOB: 1941/01/25 DOA: 05/27/2014 PCP: Kirk Ruths., MD  Admit HPI / Brief Narrative: 73 y.o. Male transferred from [facility] ED w/ known history of DM type 2, V tach, and HTN, who was admitted on 05/18/14 with right sided weakness, right facial droop and left gaze preference due to a large L-MCA CVA.  He received tPA and underwent cerebral angio with complete revascularization of L-ICA terminus, L -MCA and L-ACA recanalization with IA tPA. Intubated post procedure thorough 11/04. During that admit he was found to have new onset atrial fibrillation and was started on anticoagulation.  The patient was discharged on 05/25/14 to skilled nursing facility in [place].  Over the 24 hours prior to this admission the patient experienced progressive weakness, confusion, and a fever over 103.  ED workup was significant for elevated lactic acid, and a urinary tract infection.  A chest x-ray did not show any acute process.  A CT head noted evolving L B. Ganglia CVA.  Patient was transferred to [facility] for further evaluation.  HPI/Subjective: Pt sitting up at side of bed.  He is mildly confused.  He states he feels better than yesterday, and his wife agrees he is much improved.    Assessment/Plan:  Sepsis due to UTI Sepsis physiology has improved - pt is stabilizing hemodynamically - UA 11/07 abnormal, but no cx obtained at that time - UA at [facility] + - urine cx pending - cont empiric coverage and follow cx data   Toxic metabolic encephalopathy Due to above - slowly improving - follow   BPH/urinary retention Was d/c to SNF w/ indwelling foley - pt is very anxious to have cath removed - will begin voiding trial in AM   Afib  Now having some bradycardia - decrease BB dose and follow  Recent L MCA CVA s/p IV tPA, IA tPA and mechanical revasculaization D/C 05/25/14 - cont PT/OT/SLP   HTN BP currently well  controlled   HLD Cont lipitor   Hx of VTach noninducible per EPS Aug 2012  DM2 CBG well controlled   Fungal rash in the groin nystatin powder and diflucan  Code Status: FULL Family Communication: spoke w/ wife at bedside  Disposition Plan: SDU - probable transfer to tele bed in AM   Consultants: none  Procedures: none  Antibiotics: Zosyn 11/12 > Diflucan 11/12 >  DVT prophylaxis: eliquis  Objective: Blood pressure 106/57, pulse 58, temperature 97.5 F (36.4 C), temperature source Oral, resp. rate 25, height 5\' 7"  (1.702 m), weight 100.9 kg (222 lb 7.1 oz), SpO2 98 %.  Intake/Output Summary (Last 24 hours) at 05/28/14 1428 Last data filed at 05/28/14 1300  Gross per 24 hour  Intake 2255.5 ml  Output   1225 ml  Net 1030.5 ml   Exam: General: No acute respiratory distress Lungs: Clear to auscultation bilaterally without wheezes or crackles Cardiovascular: Regular rhythm without murmur gallop or rub normal S1 and S2 Abdomen: Nontender, nondistended, soft, bowel sounds positive, no rebound, no ascites, no appreciable mass Extremities: No significant cyanosis, clubbing, or edema bilateral lower extremities  Data Reviewed: Basic Metabolic Panel:  Recent Labs Lab 05/23/14 0625 05/28/14 0313  NA 140 141  K 3.8 4.0  CL 102 105  CO2 21 20  GLUCOSE 120* 140*  BUN 22 13  CREATININE 0.62 0.74  CALCIUM 9.0 8.8    Liver Function Tests: No results for input(s): AST, ALT, ALKPHOS, BILITOT, PROT, ALBUMIN  in the last 168 hours. No results for input(s): LIPASE, AMYLASE in the last 168 hours. No results for input(s): AMMONIA in the last 168 hours.  CBC:  Recent Labs Lab 05/28/14 0313  WBC 8.7  HGB 13.8  HCT 41.4  MCV 97.4  PLT 188    Cardiac Enzymes: No results for input(s): CKTOTAL, CKMB, CKMBINDEX, TROPONINI in the last 168 hours.  CBG:  Recent Labs Lab 05/27/14 1647 05/27/14 2237 05/28/14 0127 05/28/14 0825 05/28/14 1153  GLUCAP 173* 123*  142* 123* 123*    Recent Results (from the past 240 hour(s))  MRSA PCR Screening     Status: None   Collection Time: 05/18/14  9:08 PM  Result Value Ref Range Status   MRSA by PCR NEGATIVE NEGATIVE Final    Comment:        The GeneXpert MRSA Assay (FDA approved for NASAL specimens only), is one component of a comprehensive MRSA colonization surveillance program. It is not intended to diagnose MRSA infection nor to guide or monitor treatment for MRSA infections.   Culture, blood (routine x 2)     Status: None (Preliminary result)   Collection Time: 05/27/14  4:40 PM  Result Value Ref Range Status   Specimen Description BLOOD RIGHT ARM  Final   Special Requests BOTTLES DRAWN AEROBIC ONLY 10CC  Final   Culture  Setup Time   Final    05/27/2014 21:12 Performed at [facility]    Culture   Final           BLOOD CULTURE RECEIVED NO GROWTH TO DATE CULTURE WILL BE HELD FOR 5 DAYS BEFORE ISSUING A FINAL NEGATIVE REPORT Performed at [facility]    Report Status PENDING  Incomplete  Culture, blood (routine x 2)     Status: None (Preliminary result)   Collection Time: 05/27/14  4:46 PM  Result Value Ref Range Status   Specimen Description BLOOD RIGHT HAND  Final   Special Requests BOTTLES DRAWN AEROBIC ONLY 10CC  Final   Culture  Setup Time   Final    05/27/2014 21:13 Performed at [facility]    Culture   Final           BLOOD CULTURE RECEIVED NO GROWTH TO DATE CULTURE WILL BE HELD FOR 5 DAYS BEFORE ISSUING A FINAL NEGATIVE REPORT Performed at [facility]    Report Status PENDING  Incomplete  MRSA PCR Screening     Status: None   Collection Time: 05/27/14  5:13 PM  Result Value Ref Range Status   MRSA by PCR NEGATIVE NEGATIVE Final    Comment:        The GeneXpert MRSA Assay (FDA approved for NASAL specimens only), is one component of a comprehensive MRSA colonization surveillance program. It is not intended to diagnose MRSA infection  nor to guide or monitor treatment for MRSA infections.      Studies:  Recent x-ray studies have been reviewed in detail by the Attending Physician  Scheduled Meds:  Scheduled Meds: . apixaban  5 mg Oral BID  . atorvastatin  20 mg Oral q1800  . carvedilol  6.25 mg Oral BID WC  . finasteride  5 mg Oral Daily  . fluconazole  100 mg Oral Daily  . insulin aspart  0-15 Units Subcutaneous TID WC  . neomycin-bacitracin-polymyxin   Topical TID  . nystatin   Topical TID  . piperacillin-tazobactam (ZOSYN)  IV  3.375 g Intravenous Q8H  . sodium  chloride  3 mL Intravenous Q12H  . tamsulosin  0.4 mg Oral QPC breakfast  . verapamil  180 mg Oral QHS  . vitamin C  500 mg Oral Daily    Time spent on care of this patient: 35 mins   Blanton Kardell T , MD   [facility] Office  [phone] Pager - Text Page per Shea Evans as per below:  On-Call/Text Page:      [email or web address]      password [email or web address]  If 7PM-7AM, please contact night-coverage [email or web address] Password [email or web address] 05/28/2014, 2:28 PM   LOS: 1 day

## 2014-05-28 NOTE — Evaluation (Signed)
Physical Therapy Evaluation Patient Details Name: Austin Warren MRN: [id number] DOB: [birth year] Today's Date: 05/28/2014   History of Present Illness  73 y.o. male, history of DM type 2, V tach, HTN, who was admitted on 05/18/14 with large dense L-MCA sign and he received IV tPA and revascularization. Pt was discharged on 05/25/14 to skilled nursing facility at [facility]. Over the last 24 hours patient has progressive weakness, confusion, developed fever over 103 , workup for sepsis.  Clinical Impression  Patient demonstrates deficits in functional mobility as indicated below. Will need  Continued skilled PT to address deficits and maximize function. Will see as indicated and progress as tolerated. Recommend return to [facility] for rehab when medically appropriate.    Follow Up Recommendations SNF;Supervision/Assistance - 24 hour (return to [facility])    Equipment Recommendations   (TBD)    Recommendations for Other Services       Precautions / Restrictions Precautions Precautions: Fall Precaution Comments: expressive difficulties Restrictions Weight Bearing Restrictions: No      Mobility  Bed Mobility Overal bed mobility: Needs Assistance Bed Mobility: Supine to Sit     Supine to sit: Min assist     General bed mobility comments: Assist for elevation to upright, use of bed rail for positioning   Transfers Overall transfer level: Needs assistance Equipment used: None Transfers: Sit to/from Stand Sit to Stand: Min assist         General transfer comment: Assist for stability upon coming to upright  Ambulation/Gait Ambulation/Gait assistance: Min assist Ambulation Distance (Feet): 40 Feet Assistive device: 1 person hand held assist     Gait velocity interpretation: Below normal speed for age/gender General Gait Details: Instability noted with ambulation, patient with increased fatigue with limited activity.   Stairs            Wheelchair Mobility     Modified Rankin (Stroke Patients Only)       Balance     Sitting balance-Leahy Scale: Fair Sitting balance - Comments: difficulty accepting challenge   Standing balance support: During functional activity Standing balance-Leahy Scale: Fair                               Pertinent Vitals/Pain Pain Assessment: No/denies pain    Home Living Family/patient expects to be discharged to:: Skilled nursing facility (continued rehab) Living Arrangements: Spouse/significant other Available Help at Discharge: Family Type of Home: House                Prior Function Level of Independence: Independent (prior to recent admit)         Comments: was at rehab from CVA     Hand Dominance   Dominant Hand: Right    Extremity/Trunk Assessment               Lower Extremity Assessment: Generalized weakness RLE Deficits / Details: modest asymetry for strength compared to left    Cervical / Trunk Assessment: Normal  Communication   Communication: Expressive difficulties  Cognition Arousal/Alertness: Awake/alert Behavior During Therapy: WFL for tasks assessed/performed Overall Cognitive Status: Impaired/Different from baseline     Current Attention Level: Sustained   Following Commands: Follows multi-step commands inconsistently (apraxia)   Awareness: Intellectual   General Comments: expressive difficulties, orientedx3 when given choices.  Some noted apraxia.    General Comments      Exercises        Assessment/Plan    PT Assessment  Patient needs continued PT services  PT Diagnosis Difficulty walking;Abnormality of gait;Altered mental status   PT Problem List Decreased strength;Decreased activity tolerance;Decreased balance;Decreased mobility;Decreased coordination;Decreased cognition;Decreased safety awareness  PT Treatment Interventions DME instruction;Gait training;Functional mobility training;Therapeutic activities;Therapeutic  exercise;Balance training;Patient/family education   PT Goals (Current goals can be found in the Care Plan section) Acute Rehab PT Goals Patient Stated Goal: none stated this session PT Goal Formulation: With patient Time For Goal Achievement: 06/11/14 Potential to Achieve Goals: Good    Frequency Min 3X/week   Barriers to discharge        Co-evaluation               End of Session Equipment Utilized During Treatment: Gait belt Activity Tolerance: Patient tolerated treatment well Patient left: in bed;with call bell/phone within reach;with family/visitor present Nurse Communication: Mobility status         Time: 0211-1552 PT Time Calculation (min) (ACUTE ONLY): 20 min   Charges:   PT Evaluation $Initial PT Evaluation Tier I: 1 Procedure PT Treatments $Therapeutic Activity: 8-22 mins   PT G CodesDuncan Dull 05/28/2014, 3:03 PM Alben Deeds, [place] DPT  [phone]

## 2014-05-28 NOTE — Plan of Care (Signed)
Problem: Consults Goal: UTI/Pyelonephritis Patient Education See Patient Education Module for education specifics. Outcome: Completed/Met Date Met:  05/28/14  Problem: Phase I Progression Outcomes Goal: Pain controlled with appropriate interventions Outcome: Completed/Met Date Met:  05/28/14 Goal: OOB as tolerated unless otherwise ordered Outcome: Completed/Met Date Met:  05/28/14 Goal: Vital Signs stable- temperature less than 102 Outcome: Completed/Met Date Met:  05/28/14 Goal: Adequate I & O Outcome: Completed/Met Date Met:  05/28/14 Goal: Tolerating diet Outcome: Completed/Met Date Met:  05/28/14

## 2014-05-28 NOTE — Plan of Care (Signed)
Problem: Phase II Progression Outcomes Goal: Progress activity as tolerated unless otherwise ordered Outcome: Completed/Met Date Met:  05/28/14 Goal: Tolerating diet Outcome: Completed/Met Date Met:  05/28/14  Problem: Phase III Progression Outcomes Goal: Pain controlled on oral analgesia Outcome: Completed/Met Date Met:  05/28/14 Goal: Tolerating diet Outcome: Completed/Met Date Met:  05/28/14

## 2014-05-28 NOTE — Clinical Social Work Psychosocial (Signed)
Clinical Social Work Department BRIEF PSYCHOSOCIAL ASSESSMENT 05/28/2014  Patient:  Austin Warren, Austin Warren     Account Number:  [id number]     Admit date:  05/27/2014  Clinical Social Worker:  Domenica Reamer, [place]  Date/Time:  05/28/2014 04:07 PM  Referred by:  Physician  Date Referred:  05/28/2014 Referred for  SNF Placement   Other Referral:   Interview type:  Family Other interview type:   Wife, Nicole Kindred, at bedside    PSYCHOSOCIAL DATA Living Status:  FACILITY Admitted from facility:  [place] Level of care:  [place] Primary support name:  Melburn Hake Primary support relationship to patient:  SPOUSE Degree of support available:   Patient has high level of support from wife who was at bedside    CURRENT CONCERNS Current Concerns  Post-Acute Placement   Other Concerns:    SOCIAL WORK ASSESSMENT / PLAN CSW spoke with patient concerning return to [facility].  Patient was groggy and did not communicate with CSW but patients wife stated that he would definitely be going back to [facility] to finish rehab since he was only there for a few days before this readmission.  CSW will continue to follow.   Assessment/plan status:  Psychosocial Support/Ongoing Assessment of Needs Other assessment/ plan:   FL2 update   Information/referral to community resources:   [place]    PATIENT'S/FAMILY'S RESPONSE TO PLAN OF CARE: Patients wife is agreeable to return to [facility] and is happy to hear he will likely DC monday.       Domenica Reamer, [place] Social Worker [phone]

## 2014-05-28 NOTE — Progress Notes (Signed)
46 Dr Thereasa Solo made aware that Austin Warren is having HR as low as 40's. In at fib & BP stable Austin Warren asymptomatic

## 2014-05-29 DIAGNOSIS — E1159 Type 2 diabetes mellitus with other circulatory complications: Secondary | ICD-10-CM

## 2014-05-29 LAB — CBC
HCT: 38.2 % — ABNORMAL LOW (ref 39.0–52.0)
Hemoglobin: 12.8 g/dL — ABNORMAL LOW (ref 13.0–17.0)
MCH: 32.7 pg (ref 26.0–34.0)
MCHC: 33.5 g/dL (ref 30.0–36.0)
MCV: 97.7 fL (ref 78.0–100.0)
Platelets: 174 10*3/uL (ref 150–400)
RBC: 3.91 MIL/uL — AB (ref 4.22–5.81)
RDW: 13.6 % (ref 11.5–15.5)
WBC: 5.5 10*3/uL (ref 4.0–10.5)

## 2014-05-29 LAB — COMPREHENSIVE METABOLIC PANEL
ALT: 25 U/L (ref 0–53)
ANION GAP: 15 (ref 5–15)
AST: 29 U/L (ref 0–37)
Albumin: 2.7 g/dL — ABNORMAL LOW (ref 3.5–5.2)
Alkaline Phosphatase: 69 U/L (ref 39–117)
BILIRUBIN TOTAL: 0.9 mg/dL (ref 0.3–1.2)
BUN: 12 mg/dL (ref 6–23)
CHLORIDE: 104 meq/L (ref 96–112)
CO2: 21 meq/L (ref 19–32)
Calcium: 8.4 mg/dL (ref 8.4–10.5)
Creatinine, Ser: 0.68 mg/dL (ref 0.50–1.35)
GFR calc Af Amer: >90 mL/min (ref >90)
Glucose, Bld: 107 mg/dL — ABNORMAL HIGH (ref 70–99)
Potassium: 3.5 mEq/L — ABNORMAL LOW (ref 3.7–5.3)
Sodium: 140 mEq/L (ref 137–147)
Total Protein: 5.7 g/dL — ABNORMAL LOW (ref 6.0–8.3)

## 2014-05-29 LAB — GLUCOSE, CAPILLARY
Glucose-Capillary: 122 mg/dL — ABNORMAL HIGH (ref 70–99)
Glucose-Capillary: 129 mg/dL — ABNORMAL HIGH (ref 70–99)
Glucose-Capillary: 113 mg/dL — ABNORMAL HIGH (ref 70–99)
Glucose-Capillary: 121 mg/dL — ABNORMAL HIGH (ref 70–99)

## 2014-05-29 MED ORDER — VERAPAMIL HCL ER 120 MG PO TBCR
120.0000 mg | EXTENDED_RELEASE_TABLET | Freq: Every day | ORAL | Status: DC
  Administered 2014-05-29: 120 mg via ORAL
  Filled 2014-05-29 (×2): qty 1

## 2014-05-29 MED ORDER — CEFTRIAXONE SODIUM IN DEXTROSE 20 MG/ML IV SOLN
1.0000 g | INTRAVENOUS | Status: AC
  Administered 2014-05-29 – 2014-05-30 (×2): 1 g via INTRAVENOUS
  Filled 2014-05-29 (×2): qty 50

## 2014-05-29 MED ORDER — POTASSIUM CHLORIDE CRYS ER 20 MEQ PO TBCR
40.0000 meq | EXTENDED_RELEASE_TABLET | Freq: Once | ORAL | Status: AC
  Administered 2014-05-29: 40 meq via ORAL
  Filled 2014-05-29 (×2): qty 2

## 2014-05-29 MED ORDER — BETHANECHOL CHLORIDE 10 MG PO TABS
10.0000 mg | ORAL_TABLET | Freq: Four times a day (QID) | ORAL | Status: DC
  Administered 2014-05-29 – 2014-05-31 (×8): 10 mg via ORAL
  Filled 2014-05-29 (×10): qty 1

## 2014-05-29 NOTE — Significant Event (Signed)
Patient had a medium size stool with a moderate amount of bright red blood noted with a clot. No obvious signs of acute distress noted. Kathline Magic, NP notified. No new orders obtained.

## 2014-05-29 NOTE — Progress Notes (Signed)
[place] TEAM 1 - Stepdown/ICU TEAM Progress Note  Austin Warren:[id number] DOB: 04/19/1941 DOA: 05/27/2014 PCP: Kirk Ruths., MD  Admit HPI / Brief Narrative: 73 yo male transferred from [facility] ED w/ known history of DM type 2, V tach, and HTN, who was admitted on 05/18/14 with right sided weakness, right facial droop and left gaze preference due to a large L-MCA CVA.  He received tPA and underwent cerebral angio with complete revascularization of L-ICA terminus, L -MCA and L-ACA recanalization with IA tPA. Intubated post procedure thorough 11/04. During that admit he was found to have new onset atrial fibrillation and was started on anticoagulation.  The patient was discharged on 05/25/14 to a skilled nursing facility in [place].  Over the 24 hours prior to this admission the patient experienced progressive weakness, confusion, and a fever over 103.  ED workup was significant for elevated lactic acid, and a urinary tract infection.  A chest x-ray did not show any acute process.  A CT head noted evolving L Basa; Ganglia CVA.  Patient was transferred to [facility] for further evaluation.  HPI/Subjective: Pt is feeling much better today.  His foley was removed this morning.  Thus far he has had one episode of urinary leakage/incontinence, and anther episode when he was able to pass a small amount of urine.  He denies abdom pain or suprapubic pressure.  His glans is swollen but not painful.  He denies sob or cp.    Assessment/Plan:  Sepsis due to UTI Sepsis physiology has improved - pt is stabilizing hemodynamically - UA 11/07 abnormal, but no cx obtained at that time - UA at [facility] + - urine cx has not been helfpul in isolating an offending agent - cont empiric coverage to complete a 10 day course, but narrow to rocephin and ollow   Toxic metabolic encephalopathy Due to above - essentially back to baseline ms today   BPH/urinary retention Was d/c to SNF w/ indwelling  foley - pt was very anxious to have cath removed - foley removed this am - follow for signs of retention - urecholine initiated for empiric short course of tx    Afib  Bradycardia has improved w/ lower BB dose - follow   Hypokalemia Replace and follow - check Mg in AM   Recent L MCA CVA s/p IV tPA, IA tPA and mechanical revasculaization D/C 05/25/14 - cont PT/OT/SLP   HTN BP currently well controlled w/ episodes of hypotension still noted - adjust medical tx   HLD Cont lipitor   Hx of VTach noninducible per EPS Aug 2012  DM2 CBG well controlled   Fungal rash in the groin nystatin powder and diflucan  Code Status: FULL Family Communication: spoke w/ wife at bedside  Disposition Plan: SDU    Consultants: none  Procedures: none  Antibiotics: Zosyn 11/12 > 11/14 Diflucan 11/12 > Rocephin 11/14 >  DVT prophylaxis: eliquis  Objective: Blood pressure 118/60, pulse 61, temperature 97.4 F (36.3 C), temperature source Oral, resp. rate 20, height 5\' 7"  (1.702 m), weight 101.6 kg (223 lb 15.8 oz), SpO2 100 %.  Intake/Output Summary (Last 24 hours) at 05/29/14 1448 Last data filed at 05/29/14 1100  Gross per 24 hour  Intake   1065 ml  Output   1500 ml  Net   -435 ml   Exam: General: No acute respiratory distress Lungs: Clear to auscultation bilaterally without wheezes or crackles Cardiovascular: Regular rhythm without murmur gallop or rub  Abdomen: Nontender,  nondistended, soft, bowel sounds positive, no rebound, no ascites, no appreciable mass Extremities: No significant cyanosis, or clubbing;  1+ edema bilateral lower extremities  Data Reviewed: Basic Metabolic Panel:  Recent Labs Lab 05/23/14 0625 05/28/14 0313 05/29/14 0403  NA 140 141 140  K 3.8 4.0 3.5*  CL 102 105 104  CO2 21 20 21   GLUCOSE 120* 140* 107*  BUN 22 13 12   CREATININE 0.62 0.74 0.68  CALCIUM 9.0 8.8 8.4    Liver Function Tests:  Recent Labs Lab 05/29/14 0403  AST 29  ALT 25   ALKPHOS 69  BILITOT 0.9  PROT 5.7*  ALBUMIN 2.7*   CBC:  Recent Labs Lab 05/28/14 0313 05/29/14 0403  WBC 8.7 5.5  HGB 13.8 12.8*  HCT 41.4 38.2*  MCV 97.4 97.7  PLT 188 174   CBG:  Recent Labs Lab 05/28/14 1153 05/28/14 1724 05/28/14 2226 05/29/14 0734 05/29/14 1135  GLUCAP 123* 138* 117* 122* 129*    Recent Results (from the past 240 hour(s))  Culture, blood (routine x 2)     Status: None (Preliminary result)   Collection Time: 05/27/14  4:40 PM  Result Value Ref Range Status   Specimen Description BLOOD RIGHT ARM  Final   Special Requests BOTTLES DRAWN AEROBIC ONLY 10CC  Final   Culture  Setup Time   Final    05/27/2014 21:12 Performed at [facility]    Culture   Final           BLOOD CULTURE RECEIVED NO GROWTH TO DATE CULTURE WILL BE HELD FOR 5 DAYS BEFORE ISSUING A FINAL NEGATIVE REPORT Performed at [facility]    Report Status PENDING  Incomplete  Culture, blood (routine x 2)     Status: None (Preliminary result)   Collection Time: 05/27/14  4:46 PM  Result Value Ref Range Status   Specimen Description BLOOD RIGHT HAND  Final   Special Requests BOTTLES DRAWN AEROBIC ONLY 10CC  Final   Culture  Setup Time   Final    05/27/2014 21:13 Performed at [facility]    Culture   Final           BLOOD CULTURE RECEIVED NO GROWTH TO DATE CULTURE WILL BE HELD FOR 5 DAYS BEFORE ISSUING A FINAL NEGATIVE REPORT Performed at [facility]    Report Status PENDING  Incomplete  MRSA PCR Screening     Status: None   Collection Time: 05/27/14  5:13 PM  Result Value Ref Range Status   MRSA by PCR NEGATIVE NEGATIVE Final    Comment:        The GeneXpert MRSA Assay (FDA approved for NASAL specimens only), is one component of a comprehensive MRSA colonization surveillance program. It is not intended to diagnose MRSA infection nor to guide or monitor treatment for MRSA infections.   Culture, Urine     Status: None   Collection  Time: 05/27/14  5:14 PM  Result Value Ref Range Status   Specimen Description URINE, CATHETERIZED  Final   Special Requests Normal  Final   Culture  Setup Time   Final    05/27/2014 22:40 Performed at [facility]   Final    8,000 COLONIES/ML Performed at [facility]    Culture   Final    INSIGNIFICANT GROWTH Performed at [facility]    Report Status 05/28/2014 FINAL  Final     Studies:  Recent  x-ray studies have been reviewed in detail by the Attending Physician  Scheduled Meds:  Scheduled Meds: . apixaban  5 mg Oral BID  . atorvastatin  20 mg Oral q1800  . bethanechol  5 mg Oral QID  . carvedilol  3.125 mg Oral BID WC  . finasteride  5 mg Oral Daily  . insulin aspart  0-15 Units Subcutaneous TID WC  . neomycin-bacitracin-polymyxin   Topical TID  . nystatin   Topical TID  . piperacillin-tazobactam (ZOSYN)  IV  3.375 g Intravenous Q8H  . sodium chloride  3 mL Intravenous Q12H  . tamsulosin  0.4 mg Oral QPC breakfast  . verapamil  180 mg Oral QHS  . vitamin C  500 mg Oral Daily    Time spent on care of this patient: 35 mins   Laria Grimmett T , MD   [facility] Office  [phone] Pager - Text Page per Shea Evans as per below:  On-Call/Text Page:      [email or web address]      password [email or web address]  If 7PM-7AM, please contact night-coverage [email or web address] Password [email or web address] 05/29/2014, 2:48 PM   LOS: 2 days

## 2014-05-29 NOTE — Progress Notes (Signed)
Foley cath. Removed per MD order; patient has swollen foreskin with some drainage; will monitor ability to void.

## 2014-05-29 NOTE — Progress Notes (Signed)
Patient voided incontinent in the chair; attempted to stand and was incontinent on the floor; unable to measure; skin care provided; reattempted with the urinal; unable to void any more; will bladder scan for residual.

## 2014-05-30 DIAGNOSIS — I10 Essential (primary) hypertension: Secondary | ICD-10-CM

## 2014-05-30 LAB — BASIC METABOLIC PANEL
Anion gap: 13 (ref 5–15)
BUN: 10 mg/dL (ref 6–23)
CALCIUM: 8.6 mg/dL (ref 8.4–10.5)
CHLORIDE: 102 meq/L (ref 96–112)
CO2: 20 meq/L (ref 19–32)
Creatinine, Ser: 0.53 mg/dL (ref 0.50–1.35)
GFR calc Af Amer: >90 mL/min (ref >90)
GFR calc non Af Amer: >90 mL/min (ref >90)
GLUCOSE: 116 mg/dL — AB (ref 70–99)
Potassium: 3.7 mEq/L (ref 3.7–5.3)
SODIUM: 135 meq/L — AB (ref 137–147)

## 2014-05-30 LAB — CBC
HEMATOCRIT: 40.6 % (ref 39.0–52.0)
HEMOGLOBIN: 13.5 g/dL (ref 13.0–17.0)
MCH: 32.3 pg (ref 26.0–34.0)
MCHC: 33.3 g/dL (ref 30.0–36.0)
MCV: 97.1 fL (ref 78.0–100.0)
Platelets: 170 10*3/uL (ref 150–400)
RBC: 4.18 MIL/uL — ABNORMAL LOW (ref 4.22–5.81)
RDW: 13.3 % (ref 11.5–15.5)
WBC: 4.7 10*3/uL (ref 4.0–10.5)

## 2014-05-30 LAB — HEMOGLOBIN AND HEMATOCRIT, BLOOD
HCT: 39.7 % (ref 39.0–52.0)
HCT: 43.4 % (ref 39.0–52.0)
HEMOGLOBIN: 13.2 g/dL (ref 13.0–17.0)
HEMOGLOBIN: 14.4 g/dL (ref 13.0–17.0)

## 2014-05-30 LAB — GLUCOSE, CAPILLARY
GLUCOSE-CAPILLARY: 157 mg/dL — AB (ref 70–99)
GLUCOSE-CAPILLARY: 153 mg/dL — AB (ref 70–99)
GLUCOSE-CAPILLARY: 148 mg/dL — AB (ref 70–99)

## 2014-05-30 LAB — URINE CULTURE

## 2014-05-30 MED ORDER — HYDROCORTISONE 2.5 % RE CREA
TOPICAL_CREAM | Freq: Four times a day (QID) | RECTAL | Status: DC
  Administered 2014-05-30 (×4): via RECTAL
  Filled 2014-05-30: qty 28.35

## 2014-05-30 MED ORDER — CEFUROXIME AXETIL 500 MG PO TABS
500.0000 mg | ORAL_TABLET | Freq: Two times a day (BID) | ORAL | Status: DC
  Administered 2014-05-31: 500 mg via ORAL
  Filled 2014-05-30 (×3): qty 1

## 2014-05-30 MED ORDER — HYDROCORTISONE ACETATE 25 MG RE SUPP
25.0000 mg | Freq: Two times a day (BID) | RECTAL | Status: DC | PRN
  Filled 2014-05-30: qty 1

## 2014-05-30 MED ORDER — VERAPAMIL HCL ER 120 MG PO TBCR: 120.0000 mg | EXTENDED_RELEASE_TABLET | Freq: Every day | ORAL | Status: DC

## 2014-05-30 MED ORDER — VERAPAMIL HCL ER 180 MG PO TBCR
180.0000 mg | EXTENDED_RELEASE_TABLET | Freq: Every day | ORAL | Status: DC
  Administered 2014-05-30: 180 mg via ORAL
  Filled 2014-05-30 (×2): qty 1

## 2014-05-30 MED ORDER — WITCH HAZEL-GLYCERIN EX PADS
MEDICATED_PAD | CUTANEOUS | Status: DC | PRN
  Administered 2014-05-31: 1 via TOPICAL
  Filled 2014-05-30: qty 100

## 2014-05-30 NOTE — Progress Notes (Signed)
Spoke with patient's wife via telephone per patient request and provided her with an update regarding patient having episodes of bloody stools. Wife informed me that patient has a history of having hemorrhoids and during his hospitalization earlier this month had a similar episode. Report provided to patient's dayshift RN.

## 2014-05-30 NOTE — Progress Notes (Signed)
[place] TEAM 1 - Stepdown/ICU TEAM Progress Note  Austin Warren EEF:[id number] DOB: 09-25-1940 DOA: 05/27/2014 PCP: Kirk Ruths., MD  Admit HPI / Brief Narrative: 73 yo male transferred from [facility] ED w/ known history of DM type 2, V tach, and HTN, who was admitted on 05/18/14 with right sided weakness, right facial droop and left gaze preference due to a large L-MCA CVA.  He received tPA and underwent cerebral angio with complete revascularization of L-ICA terminus, L -MCA and L-ACA recanalization with IA tPA. Intubated post procedure thorough 11/04. During that admit he was found to have new onset atrial fibrillation and was started on anticoagulation.  The patient was discharged on 05/25/14 to a skilled nursing facility in [place].  Over the 24 hours prior to this admission the patient experienced progressive weakness, confusion, and a fever over 103.  ED workup was significant for elevated lactic acid, and a urinary tract infection.  A chest x-ray did not show any acute process.  A CT head noted evolving L Basa; Ganglia CVA.  Patient was transferred to [facility] for further evaluation.  HPI/Subjective: Pt developed BRPR last night, due to hemorrhoids.  Has hx of same.  Appears to be slowing to a stop now w/ topical tx.  No new complaints otherwise.  Passing urine w/o difficulty.    Assessment/Plan:  Sepsis due to UTI Sepsis physiology has improved - pt stabilized hemodynamically - UA 11/07 abnormal, but no cx obtained at that time - UA at [facility] + - urine cx has not been helfpul in isolating an offending agent - cont empiric coverage to complete a 10 day course - narrowed to rocephin   Toxic metabolic encephalopathy Due to above - back to baseline ms    BPH / urinary retention Was d/c to SNF w/ indwelling foley - pt was very anxious to have cath removed - foley removed 11/14 AM - follow for signs of retention - urecholine initiated for empiric short course of tx    BRBPR / hemorrhoidal bleeding  Topical local treatment helping   Afib  Bradycardia has improved w/ lower BB dose - follow   Hypokalemia Replaced to normal   Recent L MCA CVA s/p IV tPA, IA tPA and mechanical revasculaization D/C 05/25/14 - cont PT/OT/SLP   HTN BP climbing, w/o episodes of hypotension - adjust tx and follow   HLD Cont lipitor   Hx of VTach noninducible per EPS Aug 2012  DM2 CBG reasonably well controlled   Fungal rash in the groin nystatin powder and diflucan  Code Status: FULL Family Communication: spoke w/ wife at bedside  Disposition Plan: SDU - plan for return to SNF in AM   Consultants: none  Procedures: none  Antibiotics: Zosyn 11/12 > 11/14 Diflucan 11/12 > Rocephin 11/14 >  DVT prophylaxis: eliquis  Objective: Blood pressure 151/96, pulse 79, temperature 97.3 F (36.3 C), temperature source Oral, resp. rate 25, height 5\' 7"  (1.702 m), weight 103.2 kg (227 lb 8.2 oz), SpO2 98 %.  Intake/Output Summary (Last 24 hours) at 05/30/14 0955 Last data filed at 05/30/14 0600  Gross per 24 hour  Intake    840 ml  Output    575 ml  Net    265 ml   Exam: General: No acute respiratory distress Lungs: Clear to auscultation bilaterally without wheezes or crackles Cardiovascular: Regular rhythm without murmur gallop or rub  Abdomen: Nontender, nondistended, soft, bowel sounds positive, no rebound, no ascites, no appreciable mass Extremities: No  significant cyanosis, clubbing, or edema bilateral lower extremities  Data Reviewed: Basic Metabolic Panel:  Recent Labs Lab 05/28/14 0313 05/29/14 0403 05/30/14 0018  NA 141 140 135*  K 4.0 3.5* 3.7  CL 105 104 102  CO2 20 21 20   GLUCOSE 140* 107* 116*  BUN 13 12 10   CREATININE 0.74 0.68 0.53  CALCIUM 8.8 8.4 8.6    Liver Function Tests:  Recent Labs Lab 05/29/14 0403  AST 29  ALT 25  ALKPHOS 69  BILITOT 0.9  PROT 5.7*  ALBUMIN 2.7*   CBC:  Recent Labs Lab  05/28/14 0313 05/29/14 0403 05/29/14 2340 05/30/14 0018 05/30/14 0511  WBC 8.7 5.5  --  4.7  --   HGB 13.8 12.8* 13.2 13.5 14.4  HCT 41.4 38.2* 39.7 40.6 43.4  MCV 97.4 97.7  --  97.1  --   PLT 188 174  --  170  --    CBG:  Recent Labs Lab 05/28/14 2226 05/29/14 0734 05/29/14 1135 05/29/14 1550 05/29/14 2144  GLUCAP 117* 122* 129* 113* 121*    Recent Results (from the past 240 hour(s))  Culture, blood (routine x 2)     Status: None (Preliminary result)   Collection Time: 05/27/14  4:40 PM  Result Value Ref Range Status   Specimen Description BLOOD RIGHT ARM  Final   Special Requests BOTTLES DRAWN AEROBIC ONLY 10CC  Final   Culture  Setup Time   Final    05/27/2014 21:12 Performed at [facility]    Culture   Final           BLOOD CULTURE RECEIVED NO GROWTH TO DATE CULTURE WILL BE HELD FOR 5 DAYS BEFORE ISSUING A FINAL NEGATIVE REPORT Performed at [facility]    Report Status PENDING  Incomplete  Culture, blood (routine x 2)     Status: None (Preliminary result)   Collection Time: 05/27/14  4:46 PM  Result Value Ref Range Status   Specimen Description BLOOD RIGHT HAND  Final   Special Requests BOTTLES DRAWN AEROBIC ONLY 10CC  Final   Culture  Setup Time   Final    05/27/2014 21:13 Performed at [facility]    Culture   Final           BLOOD CULTURE RECEIVED NO GROWTH TO DATE CULTURE WILL BE HELD FOR 5 DAYS BEFORE ISSUING A FINAL NEGATIVE REPORT Performed at [facility]    Report Status PENDING  Incomplete  MRSA PCR Screening     Status: None   Collection Time: 05/27/14  5:13 PM  Result Value Ref Range Status   MRSA by PCR NEGATIVE NEGATIVE Final    Comment:        The GeneXpert MRSA Assay (FDA approved for NASAL specimens only), is one component of a comprehensive MRSA colonization surveillance program. It is not intended to diagnose MRSA infection nor to guide or monitor treatment for MRSA infections.   Culture,  Urine     Status: None   Collection Time: 05/27/14  5:14 PM  Result Value Ref Range Status   Specimen Description URINE, CATHETERIZED  Final   Special Requests Normal  Final   Culture  Setup Time   Final    05/27/2014 22:40 Performed at [place]   Final    8,000 COLONIES/ML Performed at [facility]    Culture   Final    INSIGNIFICANT GROWTH Performed at [facility]    Report Status 05/28/2014 FINAL  Final     Studies:  Recent x-ray studies have been reviewed in detail by the Attending Physician  Scheduled Meds:  Scheduled Meds: . apixaban  5 mg Oral BID  . atorvastatin  20 mg Oral q1800  . bethanechol  10 mg Oral QID  . carvedilol  3.125 mg Oral BID WC  . cefTRIAXone (ROCEPHIN)  IV  1 g Intravenous Q24H  . finasteride  5 mg Oral Daily  . hydrocortisone   Rectal QID  . hydrocortisone  25 mg Rectal BID  . insulin aspart  0-15 Units Subcutaneous TID WC  . neomycin-bacitracin-polymyxin   Topical TID  . nystatin   Topical TID  . sodium chloride  3 mL Intravenous Q12H  . tamsulosin  0.4 mg Oral QPC breakfast  . verapamil  120 mg Oral QHS  . vitamin C  500 mg Oral Daily    Time spent on care of this patient: 35 mins   MCCLUNG,JEFFREY T , MD   [facility] Office  [phone] Pager - Text Page per Shea Evans as per below:  On-Call/Text Page:      [email or web address]      password [email or web address]  If 7PM-7AM, please contact night-coverage [email or web address] Password [email or web address] 05/30/2014, 9:55 AM   LOS: 3 days

## 2014-05-30 NOTE — Discharge Instructions (Signed)

## 2014-05-30 NOTE — Progress Notes (Signed)
During the course of the night patient had a total of three bright red bloody stools. The first stool was medium with a moderate amount of blood. The remaining stools were small and bloody. There has been no changes in patient's clinical status and patient has not had any complaints of any kind throughout the shift. Patient is mildly anxious regarding the blood in his stools. Kathline Magic, NP notified and provided with an update.

## 2014-05-31 LAB — CBC
HCT: 39.2 % (ref 39.0–52.0)
Hemoglobin: 13.1 g/dL (ref 13.0–17.0)
MCH: 32.2 pg (ref 26.0–34.0)
MCHC: 33.4 g/dL (ref 30.0–36.0)
MCV: 96.3 fL (ref 78.0–100.0)
Platelets: 191 10*3/uL (ref 150–400)
RBC: 4.07 MIL/uL — ABNORMAL LOW (ref 4.22–5.81)
RDW: 13.1 % (ref 11.5–15.5)
WBC: 5.5 10*3/uL (ref 4.0–10.5)

## 2014-05-31 LAB — GLUCOSE, CAPILLARY
GLUCOSE-CAPILLARY: 121 mg/dL — AB (ref 70–99)
Glucose-Capillary: 111 mg/dL — ABNORMAL HIGH (ref 70–99)

## 2014-05-31 MED ORDER — CEFUROXIME AXETIL 500 MG PO TABS: 500.0000 mg | ORAL_TABLET | Freq: Two times a day (BID) | ORAL | Status: AC

## 2014-05-31 MED ORDER — BETHANECHOL CHLORIDE 10 MG PO TABS: 10.0000 mg | ORAL_TABLET | Freq: Four times a day (QID) | ORAL | Status: AC

## 2014-05-31 MED ORDER — WITCH HAZEL-GLYCERIN EX PADS: MEDICATED_PAD | CUTANEOUS | Status: DC | PRN

## 2014-05-31 MED ORDER — HYDROCORTISONE 2.5 % RE CREA: TOPICAL_CREAM | Freq: Four times a day (QID) | RECTAL | Status: DC | PRN

## 2014-05-31 NOTE — Clinical Social Work Note (Signed)
Patient will discharge to [facility] SNF Anticipated discharge date:05/31/14 Family notified: wife at bedside Transportation by [facility]- called at 1:50pm  CSW signing off.  Domenica Reamer, [place] Social Worker [phone]

## 2014-05-31 NOTE — Progress Notes (Signed)
Physical Therapy Treatment Patient Details Name: Austin Warren MRN: [id number] DOB: 1941/01/16 Today's Date: 05/31/2014    History of Present Illness 73 y.o. male, history of DM type 2, V tach, HTN, who was admitted on 05/18/14 with large dense L-MCA sign and he received IV tPA and revascularization. Pt was discharged on 05/25/14 to skilled nursing facility at [facility]. Over the last 24 hours patient has progressive weakness, confusion, developed fever over 103 , workup for sepsis.    PT Comments    Patient tolerated ambulation well this session. Performed increased ambulation with cognition tasks during mobility. Patient demonstrates higher level balance deficits and instability when attempting to multi-task through cognition sequence.  Patient with better ability to express answers compared to previous session but demonstrates word finding expressive difficulties throughout session, responds well with increased time and cues.  Will continue to see and progress as tolerated.   Follow Up Recommendations  SNF;Supervision/Assistance - 24 hour (return to [facility])     Equipment Recommendations   (TBD)    Recommendations for Other Services Rehab consult     Precautions / Restrictions Precautions Precautions: Fall Precaution Comments: expressive difficulties Restrictions Weight Bearing Restrictions: No    Mobility  Bed Mobility               General bed mobility comments: received up in chair  Transfers Overall transfer level: Needs assistance Equipment used: None Transfers: Sit to/from Stand Sit to Stand: Min guard         General transfer comment: Min guard for elevation to standing and stability  Ambulation/Gait Ambulation/Gait assistance: Min guard Ambulation Distance (Feet): 160 Feet (160 performed x3 with 2 rounds of cognitive tasks ) Assistive device: 1 person hand held assist Gait Pattern/deviations: Step-through pattern;Decreased stride  length;Drifts right/left Gait velocity: decreased Gait velocity interpretation: Below normal speed for age/gender General Gait Details: instability noted during ambulation with cognitive tasks   Stairs            Wheelchair Mobility    Modified Rankin (Stroke Patients Only) Modified Rankin (Stroke Patients Only) Pre-Morbid Rankin Score: No symptoms Modified Rankin: Moderately severe disability     Balance   Sitting-balance support: Feet supported Sitting balance-Leahy Scale: Fair Sitting balance - Comments: difficulty accepting challenge   Standing balance support: During functional activity Standing balance-Leahy Scale: Fair                      Cognition Arousal/Alertness: Awake/alert Behavior During Therapy: WFL for tasks assessed/performed Overall Cognitive Status: Impaired/Different from baseline Area of Impairment: Orientation;Attention;Memory;Following commands;Safety/judgement;Awareness;Problem solving Orientation Level: Disoriented to;Place;Time Current Attention Level: Sustained Memory: Decreased recall of precautions;Decreased short-term memory   Safety/Judgement: Decreased awareness of safety;Decreased awareness of deficits Awareness: Intellectual Problem Solving: Slow processing      Exercises      General Comments        Pertinent Vitals/Pain      Home Living                      Prior Function            PT Goals (current goals can now be found in the care plan section) Acute Rehab PT Goals Patient Stated Goal: none stated this session PT Goal Formulation: With patient Time For Goal Achievement: 06/11/14 Potential to Achieve Goals: Good Progress towards PT goals: Progressing toward goals    Frequency  Min 3X/week    PT Plan Current plan remains appropriate  Co-evaluation             End of Session Equipment Utilized During Treatment: Gait belt Activity Tolerance: Patient tolerated treatment  well Patient left: in chair;with call bell/phone within reach;with family/visitor present     Time: 2080-2233 PT Time Calculation (min) (ACUTE ONLY): 24 min  Charges:  $Gait Training: 8-22 mins $Therapeutic Activity: 8-22 mins                    G CodesDuncan Dull 2014-06-26, 10:21 AM Alben Deeds, PT DPT  [phone]

## 2014-05-31 NOTE — Evaluation (Signed)
Occupational Therapy Evaluation Patient Details Name: Austin Warren MRN: [id number] DOB: [birth year] Today's Date: 05/31/2014    History of Present Illness 73 y.o. male, history of DM type 2, V tach, HTN, who was admitted on 05/18/14 with large dense L-MCA sign and he received IV tPA and revascularization. Pt was discharged on 05/25/14 to skilled nursing facility at [facility]. Over the last 24 hours patient has progressive weakness, confusion, developed fever over 103 , workup for sepsis.   Clinical Impression   Patient evaluated by Occupational Therapy with no further acute OT needs identified. All education has been completed and the patient has no further questions. Pt is able to perform BADLs with min A - min guard assist.  he demonstrates impaired problem solving, visual deficits, and generalized weakness.  He is to discharge back to SNF for rehab today.   Recommend continued OT at SNF.  See below for any follow-up Occupational Therapy or equipment needs. OT is signing off. Thank you for this referral.      Follow Up Recommendations  SNF    Equipment Recommendations  None recommended by OT    Recommendations for Other Services       Precautions / Restrictions Precautions Precautions: Fall Precaution Comments: expressive difficulties Restrictions Weight Bearing Restrictions: No      Mobility Bed Mobility               General bed mobility comments: received up in chair  Transfers Overall transfer level: Needs assistance Equipment used: None Transfers: Sit to/from Stand;Stand Pivot Transfers Sit to Stand: Min guard Stand pivot transfers: Min guard       General transfer comment: Min guard for elevation to standing and stability    Balance   Sitting-balance support: Feet supported Sitting balance-Leahy Scale: Good Sitting balance - Comments: able to cross legs and don/doff socks without LOB   Standing balance support: During functional  activity Standing balance-Leahy Scale: Good Standing balance comment: Pt able to retrieve item from floor without LOB - min guard assist                             ADL Overall ADL's : Needs assistance/impaired Eating/Feeding: Independent;Sitting   Grooming: Wash/dry hands;Wash/dry face;Oral care;Min guard;Standing   Upper Body Bathing: Supervision/ safety   Lower Body Bathing: Minimal assistance;Sit to/from stand   Upper Body Dressing : Minimal assistance;Sitting   Lower Body Dressing: Minimal assistance;Sit to/from stand   Toilet Transfer: Min guard;Ambulation;Comfort height toilet   Toileting- Clothing Manipulation and Hygiene: Min guard;Sit to/from stand       Functional mobility during ADLs: Min guard General ADL Comments: Pt with delayed speech.  He requires increased time for problem solving     Vision                 Additional Comments: Pt loses object when tracking to Lt, but is inconsistent.  Visual fields appear intact per confrontation testing.  Pt. albe to read clock on wall accurately.  Locates grooming items in room    Perception     Praxis      Pertinent Vitals/Pain Pain Assessment: No/denies pain     Hand Dominance Right   Extremity/Trunk Assessment Upper Extremity Assessment Upper Extremity Assessment: Generalized weakness   Lower Extremity Assessment Lower Extremity Assessment: Defer to PT evaluation   Cervical / Trunk Assessment Cervical / Trunk Assessment: Normal   Communication Communication Communication: Expressive difficulties   Cognition  Arousal/Alertness: Awake/alert Behavior During Therapy: WFL for tasks assessed/performed Overall Cognitive Status: Impaired/Different from baseline Area of Impairment: Orientation;Attention;Memory;Following commands;Safety/judgement;Awareness;Problem solving Orientation Level: Disoriented to;Place;Time Current Attention Level: Selective Memory: Decreased recall of  precautions;Decreased short-term memory   Safety/Judgement: Decreased awareness of safety;Decreased awareness of deficits Awareness: Intellectual Problem Solving: Requires verbal cues;Slow processing General Comments: Pt able to perform pathfinding on unit with one verbal cue when provided increased time to problem solve    General Comments       Exercises       Shoulder Instructions      Home Living Family/patient expects to be discharged to:: Skilled nursing facility                                    Lives With: Spouse    Prior Functioning/Environment Level of Independence: Independent        Comments: Pt had been at SNF level rehab for 1 day for previous stroke, before readmission.  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  He was independent Prior to CVA    OT Diagnosis: Generalized weakness;Cognitive deficits;Disturbance of vision   OT Problem List: Decreased strength;Impaired balance (sitting and/or standing);Impaired vision/perception;Decreased cognition;Obesity   OT Treatment/Interventions:      OT Goals(Current goals can be found in the care plan section) Acute Rehab OT Goals Patient Stated Goal: To get better and go home   OT Frequency:     Barriers to D/C:            Co-evaluation              End of Session Nurse Communication: Mobility status  Activity Tolerance: Patient tolerated treatment well Patient left: in chair;with call bell/phone within reach;with chair alarm set;with family/visitor present   Time: 0998-3382 OT Time Calculation (min): 25 min Charges:  OT General Charges $OT Visit: 1 Procedure OT Evaluation $Initial OT Evaluation Tier I: 1 Procedure OT Treatments $Self Care/Home Management : 8-22 mins G-Codes:    Jama Mcmiller M 03-Jun-2014, 1:40 PM

## 2014-05-31 NOTE — Plan of Care (Signed)
Problem: Phase I Progression Outcomes Goal: Initial discharge plan identified Outcome: Completed/Met Date Met:  05/31/14 Goal: Voiding-avoid urinary catheter unless indicated Outcome: Completed/Met Date Met:  05/31/14 Goal: Hemodynamically stable Outcome: Completed/Met Date Met:  05/31/14  Problem: Phase II Progression Outcomes Goal: Vital signs remain stable, temperature < 100 Outcome: Completed/Met Date Met:  05/31/14 Goal: Discharge plan established Outcome: Completed/Met Date Met:  05/31/14 Goal: Voiding independently Outcome: Completed/Met Date Met:  05/31/14

## 2014-05-31 NOTE — Plan of Care (Signed)
Problem: Phase III Progression Outcomes Goal: Activity at appropriate level-compared to baseline (UP IN CHAIR FOR HEMODIALYSIS)  Outcome: Completed/Met Date Met:  05/31/14 Goal: Afebrile, Vital Signs remain stable Outcome: Completed/Met Date Met:  05/31/14 Goal: IV Medications to PO Outcome: Completed/Met Date Met:  05/31/14 Goal: Discharge plan remains appropriate-arrangements made Outcome: Completed/Met Date Met:  05/31/14  Problem: Discharge Progression Outcomes Goal: Barriers To Progression Addressed/Resolved Outcome: Completed/Met Date Met:  05/31/14 Goal: Discharge plan in place and appropriate Outcome: Completed/Met Date Met:  05/31/14 Goal: Pain controlled with appropriate interventions Outcome: Completed/Met Date Met:  05/31/14 Goal: Hemodynamically stable Outcome: Completed/Met Date Met:  12/15/54 Goal: Complications resolved/controlled Outcome: Completed/Met Date Met:  05/31/14 Goal: Tolerating diet Outcome: Completed/Met Date Met:  05/31/14 Goal: Activity appropriate for discharge plan Outcome: Completed/Met Date Met:  05/31/14 Goal: Temperature < 100 x 24 hrs on PO antibiotics Outcome: Completed/Met Date Met:  05/31/14

## 2014-05-31 NOTE — Discharge Summary (Signed)
DISCHARGE SUMMARY  Austin Warren  MR#: [id number]  DOB:10-05-1940  Date of Admission: 05/27/2014 Date of Discharge: 05/31/2014  Attending Physician:MCCLUNG,JEFFREY T  Patient's PPJ:Austin W., MD  Consults:  none  Disposition: D/C back to SNF for ongoing rehab post recent CVA (not this admit)  Discharge Diagnoses: Sepsis due to UTI Toxic metabolic encephalopathy BPH / urinary retention BRBPR / hemorrhoidal bleeding  Afib  Hypokalemia Recent L MCA CVA s/p IV tPA, IA tPA and mechanical revasculaization HTN HLD  Hx of VTach DM2 Fungal rash in the groin  Initial presentation: 73 yo male transferred from [facility] ED w/ known history of DM type 2, V tach, and HTN, who was admitted on 05/18/14 with right sided weakness, right facial droop and left gaze preference due to a large L-MCA CVA. He received tPA and underwent cerebral angio with complete revascularization of L-ICA terminus, L -MCA and L-ACA recanalization with IA tPA. Intubated post procedure thorough 11/04. During that admit he was found to have new onset atrial fibrillation and was started on anticoagulation. The patient was discharged on 05/25/14 to a skilled nursing facility in [place].  Over the 24 hours prior to this admission the patient experienced progressive weakness, confusion, and a fever over 103. ED workup was significant for elevated lactic acid, and a urinary tract infection. A chest x-ray did not show any acute process. A CT head noted evolving L Basa; Ganglia CVA. Patient was transferred to [facility] for further evaluation.  Hospital Course:  Sepsis due to UTI Sepsis physiology resolved w/ tx - pt stabilized hemodynamically - UA 11/07 abnormal, but no cx obtained at that time - UA at [facility] + - urine cx was not been helfpul in isolating an offending agent - cont empiric coverage to complete a 10 day course - narrowed to rocephin w/ transition to ceftin at time of d/c   Toxic  metabolic encephalopathy Due to above - back to baseline ms   BPH / urinary retention Was d/c to SNF w/ indwelling foley - pt was very anxious to have cath removed - foley removed 11/14 AM - followed for signs of retention - urecholine initiated for empiric short course of tx to stop - pt urinating freely at the time of his d/c     BRBPR / hemorrhoidal bleeding  Topical local treatment has essentially resolved issue - pt educated to observe for increased blood loss or frank high volume GIB  Afib  Bradycardia has improved w/ lower BB dose - cont eliquis   Hypokalemia Replaced to normal   Recent L MCA CVA s/p IV tPA, IA tPA and mechanical revasculaization D/C 05/25/14 - cont PT/OT/SLP   HTN BP well controlled at time of d/c    HLD Cont lipitor   Hx of VTach noninducible per EPS Aug 2012  DM2 CBG reasonably well controlled   Fungal rash in the groin nystatin powder and diflucan    Medication List    ASK your doctor about these medications        apixaban 5 MG Tabs tablet  Commonly known as:  ELIQUIS  Take 1 tablet (5 mg total) by mouth 2 (two) times daily.     atorvastatin 20 MG tablet  Commonly known as:  LIPITOR  Take 1 tablet (20 mg total) by mouth daily at 6 PM.     carvedilol 6.25 MG tablet  Commonly known as:  COREG  Take 6.25 mg by mouth 2 (two) times daily with a meal. 8am and  5pm     finasteride 5 MG tablet  Commonly known as:  PROSCAR  Take 5 mg by mouth daily.     lisinopril 5 MG tablet  Commonly known as:  PRINIVIL,ZESTRIL  Take 1 tablet (5 mg total) by mouth daily.     metFORMIN 500 MG tablet  Commonly known as:  GLUCOPHAGE  Take 500 mg by mouth 2 (two) times daily with a meal.     multivitamin with minerals Tabs tablet  Take 1 tablet by mouth daily. One A Day 50+     tamsulosin 0.4 MG Caps capsule  Commonly known as:  FLOMAX  Take 0.4 mg by mouth daily after breakfast.     torsemide 10 MG tablet  Commonly known as:  DEMADEX  Take 10  mg by mouth daily.     verapamil 180 MG CR tablet  Commonly known as:  CALAN-SR  Take 180 mg by mouth at bedtime.     vitamin C 500 MG tablet  Commonly known as:  ASCORBIC ACID  Take 500 mg by mouth daily.     Vitamin D3 1000 UNITS Caps  Take 1,000 Units by mouth daily.       Day of Discharge BP 124/68 mmHg  Pulse 51  Temp(Src) 97.7 F (36.5 C) (Oral)  Resp 10  Ht 5\' 7"  (1.702 m)  Wt 99.6 kg (219 lb 9.3 oz)  BMI 34.38 kg/m2  SpO2 99%  Physical Exam: General: No acute respiratory distress Lungs: Clear to auscultation bilaterally without wheezes or crackles Cardiovascular: Regular rate without murmur gallop or rub  Abdomen: Nontender, nondistended, soft, bowel sounds positive, no rebound, no ascites, no appreciable mass Extremities: No significant cyanosis, clubbing;  trace edema bilateral lower extremities  Basic Metabolic Panel:  Recent Labs Lab 05/28/14 0313 05/29/14 0403 05/30/14 0018  NA 141 140 135*  K 4.0 3.5* 3.7  CL 105 104 102  CO2 20 21 20   GLUCOSE 140* 107* 116*  BUN 13 12 10   CREATININE 0.74 0.68 0.53  CALCIUM 8.8 8.4 8.6    Liver Function Tests:  Recent Labs Lab 05/29/14 0403  AST 29  ALT 25  ALKPHOS 69  BILITOT 0.9  PROT 5.7*  ALBUMIN 2.7*   CBC:  Recent Labs Lab 05/28/14 0313 05/29/14 0403 05/29/14 2340 05/30/14 0018 05/30/14 0511 05/31/14 0009  WBC 8.7 5.5  --  4.7  --  5.5  HGB 13.8 12.8* 13.2 13.5 14.4 13.1  HCT 41.4 38.2* 39.7 40.6 43.4 39.2  MCV 97.4 97.7  --  97.1  --  96.3  PLT 188 174  --  170  --  191   CBG:  Recent Labs Lab 05/30/14 0810 05/30/14 1307 05/30/14 1638 05/31/14 0747 05/31/14 1158  GLUCAP 157* 153* 148* 121* 111*    Recent Results (from the past 240 hour(s))  Culture, blood (routine x 2)     Status: None (Preliminary result)   Collection Time: 05/27/14  4:40 PM  Result Value Ref Range Status   Specimen Description BLOOD RIGHT ARM  Final   Special Requests BOTTLES DRAWN AEROBIC ONLY 10CC   Final   Culture  Setup Time   Final    05/27/2014 21:12 Performed at [facility]    Culture   Final           BLOOD CULTURE RECEIVED NO GROWTH TO DATE CULTURE WILL BE HELD FOR 5 DAYS BEFORE ISSUING A FINAL NEGATIVE REPORT Performed at [facility]  Report Status PENDING  Incomplete  Culture, blood (routine x 2)     Status: None (Preliminary result)   Collection Time: 05/27/14  4:46 PM  Result Value Ref Range Status   Specimen Description BLOOD RIGHT HAND  Final   Special Requests BOTTLES DRAWN AEROBIC ONLY 10CC  Final   Culture  Setup Time   Final    05/27/2014 21:13 Performed at [facility]    Culture   Final           BLOOD CULTURE RECEIVED NO GROWTH TO DATE CULTURE WILL BE HELD FOR 5 DAYS BEFORE ISSUING A FINAL NEGATIVE REPORT Performed at [facility]    Report Status PENDING  Incomplete  MRSA PCR Screening     Status: None   Collection Time: 05/27/14  5:13 PM  Result Value Ref Range Status   MRSA by PCR NEGATIVE NEGATIVE Final    Comment:        The GeneXpert MRSA Assay (FDA approved for NASAL specimens only), is one component of a comprehensive MRSA colonization surveillance program. It is not intended to diagnose MRSA infection nor to guide or monitor treatment for MRSA infections.   Culture, Urine     Status: None   Collection Time: 05/27/14  5:14 PM  Result Value Ref Range Status   Specimen Description URINE, CATHETERIZED  Final   Special Requests Normal  Final   Culture  Setup Time   Final    05/27/2014 22:40 Performed at [place]   Final    8,000 COLONIES/ML Performed at [facility]    Culture   Final    INSIGNIFICANT GROWTH Performed at [facility]    Report Status 05/28/2014 FINAL  Final      Time spent in discharge (includes decision making & examination of pt): >35 minutes  05/31/2014, 1:19 PM   Cherene Altes, MD [facility] Office   [phone] Pager [phone]  On-Call/Text Page:      [email or web address]      password [facility]

## 2014-06-01 LAB — CULTURE, BLOOD (SINGLE)

## 2014-06-02 LAB — CULTURE, BLOOD (ROUTINE X 2)
Culture: NO GROWTH
Culture: NO GROWTH

## 2014-06-04 NOTE — Telephone Encounter (Signed)
Erroneous entry

## 2014-06-11 ENCOUNTER — Other Ambulatory Visit (HOSPITAL_COMMUNITY): Payer: Self-pay | Admitting: Interventional Radiology

## 2014-06-11 DIAGNOSIS — I639 Cerebral infarction, unspecified: Secondary | ICD-10-CM

## 2014-06-14 ENCOUNTER — Ambulatory Visit (HOSPITAL_COMMUNITY)
Admission: RE | Admit: 2014-06-14 | Discharge: 2014-06-14 | Disposition: A | Discharge status: Home / Self Care | Payer: Medicare Other | Source: Ambulatory Visit | Attending: Interventional Radiology | Admitting: Interventional Radiology

## 2014-06-14 DIAGNOSIS — I639 Cerebral infarction, unspecified: Secondary | ICD-10-CM

## 2014-06-15 ENCOUNTER — Encounter: Payer: Self-pay | Admitting: Internal Medicine

## 2014-06-16 ENCOUNTER — Encounter: Payer: Self-pay | Admitting: Internal Medicine

## 2014-06-19 DIAGNOSIS — I482 Chronic atrial fibrillation, unspecified: Secondary | ICD-10-CM | POA: Insufficient documentation

## 2014-06-19 DIAGNOSIS — I69920 Aphasia following unspecified cerebrovascular disease: Secondary | ICD-10-CM | POA: Insufficient documentation

## 2014-06-21 DIAGNOSIS — I639 Cerebral infarction, unspecified: Secondary | ICD-10-CM | POA: Insufficient documentation

## 2014-06-22 ENCOUNTER — Encounter: Payer: Self-pay | Admitting: Physician Assistant

## 2014-06-22 ENCOUNTER — Telehealth: Payer: Self-pay | Admitting: *Deleted

## 2014-06-22 ENCOUNTER — Ambulatory Visit (INDEPENDENT_AMBULATORY_CARE_PROVIDER_SITE_OTHER): Payer: Medicare Other | Admitting: Physician Assistant

## 2014-06-22 VITALS — BP 101/59 | HR 58 | Ht 67.0 in | Wt 213.0 lb

## 2014-06-22 DIAGNOSIS — I639 Cerebral infarction, unspecified: Secondary | ICD-10-CM

## 2014-06-22 DIAGNOSIS — I4819 Other persistent atrial fibrillation: Secondary | ICD-10-CM

## 2014-06-22 DIAGNOSIS — I472 Ventricular tachycardia, unspecified: Secondary | ICD-10-CM

## 2014-06-22 DIAGNOSIS — Z7901 Long term (current) use of anticoagulants: Secondary | ICD-10-CM

## 2014-06-22 DIAGNOSIS — I481 Persistent atrial fibrillation: Secondary | ICD-10-CM

## 2014-06-22 DIAGNOSIS — I480 Paroxysmal atrial fibrillation: Secondary | ICD-10-CM

## 2014-06-22 DIAGNOSIS — I10 Essential (primary) hypertension: Secondary | ICD-10-CM

## 2014-06-22 DIAGNOSIS — I429 Cardiomyopathy, unspecified: Secondary | ICD-10-CM

## 2014-06-22 LAB — CBC
HCT: 42.2 % (ref 39.0–52.0)
Hemoglobin: 13.8 g/dL (ref 13.0–17.0)
MCHC: 32.8 g/dL (ref 30.0–36.0)
MCV: 95.6 fl (ref 78.0–100.0)
Platelets: 134 10*3/uL — ABNORMAL LOW (ref 150.0–400.0)
RBC: 4.41 Mil/uL (ref 4.22–5.81)
RDW: 13.9 % (ref 11.5–15.5)
WBC: 6 10*3/uL (ref 4.0–10.5)

## 2014-06-22 LAB — BASIC METABOLIC PANEL
BUN: 12 mg/dL (ref 6–23)
CHLORIDE: 103 meq/L (ref 96–112)
CO2: 25 mEq/L (ref 19–32)
Calcium: 8.7 mg/dL (ref 8.4–10.5)
Creatinine, Ser: 0.8 mg/dL (ref 0.4–1.5)
GFR: 107.65 mL/min (ref >60.00)
Glucose, Bld: 98 mg/dL (ref 70–99)
Potassium: 3.8 mEq/L (ref 3.5–5.1)
Sodium: 137 mEq/L (ref 135–145)

## 2014-06-22 MED ORDER — LISINOPRIL 2.5 MG PO TABS: 2.5000 mg | ORAL_TABLET | Freq: Every day | ORAL | Status: DC

## 2014-06-22 NOTE — Progress Notes (Signed)
Cardiology Office Note   Date:  06/22/2014   ID:  Austin Warren, DOB 07-Dec-1940, MRN [id number]  PCP:  Kirk Ruths., MD  Cardiologist/Electrophysiologist:  Dr. Virl Axe    History of Present Illness: Austin Warren is a 73 y.o. male with a history of idiopathic ventricular tachycardia treated with verapamil, HTN, HL, diabetes mellitus. He was admitted 05/2014 with left MCA CVA treated with IV tPA and mechanical revascularization.  He developed a fibrillation while hospitalized. He was seen by cardiology. Echo demonstrated reduced LV function with an EF of 45-50% with inferior and apical hypokinesis. No further ischemic testing was felt to be warranted. Cardiac enzymes remained normal. CHADS2-VASc=7.  Eliquis was started for [facility].  He was DC to SNF but readmitted several days later for urosepsis.  He spent 2 weeks in SNF and is now back home.  He is doing PT and SLP in [place].  He returns for follow-up.  He is overall doing well. His right side remains weak. His voice also remains week. He denies chest pain or significant dyspnea. He is NYHA 2-2b. He denies orthopnea, PND or significant pedal edema. He denies syncope.   Studies:   - LHC (4/12):  EF 50-55%, widely patent coronary arteries with minimal nonobstructive disease in the posterior lateral branch  - Echo (05/19/14):  Moderate LVH, EF 45-50%, inferior and apical HK, MAC, mild MR, mild LAE, mild RAE, PASP 47 mmHg  - Cardiac MRI (4/12): Mild LVH with mild LVE, mild posterior lateral wall HK, EF 51%, no hyperenhancement, scar or infiltration, moderate LAE   Recent Labs: 05/19/2014: LDL (calc) 58 05/29/2014: ALT 25 05/30/2014: BUN 10; Creatinine 0.53; Potassium 3.7; Sodium 135* 05/31/2014: Hemoglobin 13.1     Wt Readings from Last 3 Encounters:  06/22/14 213 lb (96.616 kg)  05/31/14 219 lb 9.3 oz (99.6 kg)  05/18/14 240 lb 15.4 oz (109.3 kg)     Past Medical History  Diagnosis Date  . Hypertension   .  Hyperlipidemia   . History of colon polyps   . Benign prostatic hypertrophy   . Arthritis   . Ventricular tachycardia     RBB/LAHB ideopathic VT, 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  . Melanoma     under arm  . Osteoarthritis   . Dyspnea on exertion 11/01/2011  . Stroke 05/2014  . UTI (urinary tract infection)   . Diabetes mellitus     type 2    Current Outpatient Prescriptions  Medication Sig Dispense Refill  . apixaban (ELIQUIS) 5 MG TABS tablet Take 1 tablet (5 mg total) by mouth 2 (two) times daily. 60 tablet 2  . Ascorbic Acid (VITAMIN C) 500 MG tablet Take 500 mg by mouth daily.      Marland Kitchen atorvastatin (LIPITOR) 20 MG tablet Take 1 tablet (20 mg total) by mouth daily at 6 PM. 30 tablet 2  . carvedilol (COREG) 6.25 MG tablet Take 6.25 mg by mouth 2 (two) times daily with a meal. 8am and 5pm    . Cholecalciferol (VITAMIN D3) 1000 UNITS CAPS Take 1,000 Units by mouth daily.     . finasteride (PROSCAR) 5 MG tablet Take 5 mg by mouth daily.      . hydrocortisone (ANUSOL-HC) 2.5 % rectal cream Place rectally 4 (four) times daily as needed for hemorrhoids or itching. 30 g 0  . lisinopril (PRINIVIL,ZESTRIL) 5 MG tablet Take 1 tablet (5 mg total) by mouth daily. 30 tablet 2  . metFORMIN (GLUCOPHAGE) 500 MG tablet Take  500 mg by mouth 2 (two) times daily with a meal.      . Multiple Vitamin (MULTIVITAMIN WITH MINERALS) TABS tablet Take 1 tablet by mouth daily. One A Day 50+    . Tamsulosin HCl (FLOMAX) 0.4 MG CAPS Take 0.4 mg by mouth daily after breakfast.     . torsemide (DEMADEX) 10 MG tablet Take 10 mg by mouth daily.     . verapamil (CALAN-SR) 180 MG CR tablet Take 180 mg by mouth at bedtime.    Marland Kitchen witch hazel-glycerin (TUCKS) pad Apply topically as needed for itching or hemorrhoids. 40 each 12   No current facility-administered medications for this visit.     Allergies:   Ciprofloxacin; Levofloxacin; and Sulfa antibiotics   Social History:  The patient  reports that he has quit smoking. He  has never used smokeless tobacco. He reports that he drinks alcohol. He reports that he does not use illicit drugs.   Family History:  The patient's family history includes Hypertension in his father; Stroke in his father. There is no history of Heart attack.    ROS:  Please see the history of present illness.   He denies any bleeding problems   All other systems reviewed and negative.    PHYSICAL EXAM: VS:  BP 101/59 mmHg  Pulse 58  Ht 5\' 7"  (1.702 m)  Wt 213 lb (96.616 kg)  BMI 33.35 kg/m2 Well nourished, well developed, in no acute distress HEENT: normal Neck: No JVD Cardiac:  normal S1, S2;  irregularly irregular rhythm; no murmur   Lungs:  Decreased breath sounds  bilaterally, no wheezing, rhonchi or rales Abd: soft, nontender, no hepatomegaly Ext: Trace bilateral ankle edema Skin: warm and dry Neuro:  CNs 2-12 intact, no focal abnormalities noted  EKG:  Atrial fibrillation, HR 58, low voltage      ASSESSMENT AND PLAN:  1.  Persistent atrial fibrillation:  Rate is controlled. His thromboembolic risk factor profile is significantly elevated. He will require long-term anticoagulation therapy.  I reviewed his case today with Dr. Caryl Comes. At this time, we do not feel that pursuing cardioversion is necessary.    -  Continue Eliquis 5 mg twice a day     -  Check CBC, BMET today    -  Continue current dose of verapamil, carvedilol  2.  Stroke:  Continue FU with neurology and PT. 3.  Cardiomyopathy:  Volume stable.     -  Continue beta blocker.     -  BP running low.  Decrease Lisinopril to 2.5 mg QD 4.  Ventricular tachycardia:   Continue verapamil 5.  Essential hypertension:  Blood pressure running low. Adjust medications as outlined above.   Disposition:   FU with Dr. Caryl Comes in [employer] 2-3 months   Signed, Versie Starks, MHS 06/22/2014 2:11 PM    [facility] [address], [place], [place]  [address] Phone: [phone]; Fax: [phone]

## 2014-06-22 NOTE — Telephone Encounter (Signed)
Wife and pt notified about lab reuslts with verbal understanding

## 2014-06-22 NOTE — Patient Instructions (Signed)
DECREASE LISINOPRIL TO 2.5 MG DAILY; NEW RX SENT IN TODAY FOR THE 2.5 MG TABLET  Your physician recommends that you schedule a follow-up appointment in: 2-3 [place] DR. [name]

## 2014-07-02 ENCOUNTER — Encounter: Payer: Self-pay | Admitting: Neurology

## 2014-07-02 ENCOUNTER — Ambulatory Visit (INDEPENDENT_AMBULATORY_CARE_PROVIDER_SITE_OTHER): Payer: Medicare Other | Admitting: Neurology

## 2014-07-02 VITALS — BP 125/71 | HR 41 | Ht 65.25 in | Wt 212.0 lb

## 2014-07-02 DIAGNOSIS — E785 Hyperlipidemia, unspecified: Secondary | ICD-10-CM

## 2014-07-02 DIAGNOSIS — I480 Paroxysmal atrial fibrillation: Secondary | ICD-10-CM

## 2014-07-02 DIAGNOSIS — I639 Cerebral infarction, unspecified: Secondary | ICD-10-CM

## 2014-07-02 NOTE — Patient Instructions (Signed)
-   continue eliquis and lipitor for stroke prevention - check BP and pulse at home 8-9am for at least a week to see the trend. - Follow up with your primary care physician for stroke risk factor modification. Recommend maintain blood pressure goal <130/80, diabetes with hemoglobin A1c goal below 6.5% and lipids with LDL cholesterol goal below 70 mg/dL.  - continue PT/OT/speech - TCD to check intracranial vessels. - follow up in 3 months.

## 2014-07-02 NOTE — Progress Notes (Signed)
STROKE NEUROLOGY FOLLOW UP NOTE  NAME: Austin Warren DOB: 1941/04/24  REASON FOR VISIT: stroke follow up HISTORY FROM: wife and chart  Today we had the pleasure of seeing Austin Warren in follow-up at our Neurology Clinic. Pt was accompanied by wife.   History Summary Austin Warren is a 73 y.o. male with PMH of DM, HTN, HLD, VT, BPH was admitted on 05/18/14 for left gaze preference, right facial droop, right sided plegia. Initial CT head showed no acute infarct but did demonstrate a large dense left MCA sign. IR was contacted. IV tPA was started and patient was brought back to IR for cerebral angiogram. NIH stroke score was 23. EKG showed atrial fibrillation, which had not previously been known. Patient was administered IV TPA followed by cerebral angio in IR where he was found to have a T occlusion with complete revascularization of Lt ICA terminus, Lt MCA and Lt ACA with 12mg  of IA TPA, 1 pass with Trevoprovue, and 1 pass with Solitaire withTICI 3 revascularization. MRI showed acute infarction involving the medial LEFT temporal lobe uncus and LEFT basal ganglia. After stabilization, he was put on eliquis 5mg  bid. Cardiology was consulted for management of new onset afib and he was put on BB and ACEI and continued verapamil for VT. Not able to tolerant foley removal so he was put back on foley catheter, and discharged to CIR.  Interval History During the interval time, he was readmitted on 05/27/14 due to confusion and high grade fever. Found to have urosepsis, put on empiric coverage to complete a 10 day course - narrowed to rocephin w/ transition to ceftin at time of d/c. Foley finally removed on 05/29/14. He was sent back to rehab and he stayed for another 10 days and discharged home. He went to see Dr. Estanislado Pandy on 06/14/14 and planning to repeat MRA in some time. He also followed up with Dr. Caryl Comes in cardiology and continued on eliquis. He is tolerating eliquis well without  side effects. His BP today 125/71.  REVIEW OF SYSTEMS: Full 14 system review of systems performed and notable only for those listed below and in HPI above, all others are negative:  Constitutional: N/A  Cardiovascular: N/A  Ear/Nose/Throat: N/A  Skin: N/A  Eyes: N/A  Respiratory: N/A  Gastroitestinal: N/A  Genitourinary: N/A Hematology/Lymphatic: N/A  Endocrine: N/A  Musculoskeletal: N/A  Allergy/Immunology: N/A  Neurological: N/A  Psychiatric: N/A  The following represents the patient's updated allergies and side effects list: Allergies  Allergen Reactions  . Ciprofloxacin Anaphylaxis  . Levofloxacin Anaphylaxis    "throat closes"  . Enalapril Maleate Swelling    Angioedema  . Metoprolol Swelling    edema  . Sulfa Antibiotics Rash    The neurologically relevant items on the patient's problem list were reviewed on today's visit.  Neurologic Examination  A problem focused neurological exam (12 or more points of the single system neurologic examination, vital signs counts as 1 point, cranial nerves count for 8 points) was performed.  Blood pressure 125/71, pulse 41, height 5' 5.25" (1.657 m), weight 212 lb (96.163 kg).  General - Well nourished, well developed, in no apparent distress.  Ophthalmologic - not able to see through.  Cardiovascular - Regular rate and rhythm with no murmur, but bradycardia.  Mental Status -  Level of arousal and orientation to time, place, and person were intact. Language was assessed and found to have frequent paraphasic errors, mildly impaired naming and repetition. Comprehension intact.  Attention span and concentration were impaired Fund of Knowledge was assessed and was impaired.  Cranial Nerves II - XII - II - Visual field intact OU. III, IV, VI - Extraocular movements intact. V - Facial sensation intact bilaterally. VII - Facial movement intact bilaterally. VIII - Hearing & vestibular intact bilaterally. X - Palate elevates  symmetrically. XI - Chin turning & shoulder shrug intact bilaterally. XII - Tongue protrusion intact.  Motor Strength - The patient's strength was normal in all extremities except decreased right dexterity and right LE 4/5 and pronator drift was absent.  Bulk was normal and fasciculations were absent.   Motor Tone - Muscle tone was assessed at the neck and appendages and was normal.  Reflexes - The patient's reflexes were normal in all extremities and he had no pathological reflexes.  Sensory - Light touch, temperature/pinprick were assessed and were decreased on the right.    Coordination - The patient had normal movements in the hands with no ataxia or dysmetria.  Tremor was absent.  Gait and Station - right hemiparetic gait.  Data reviewed: I personally reviewed the images and agree with the radiology interpretations.  Ct Head Wo Contrast 05/18/2014  Suggestion of minimal hypodensity within the left basal ganglia region possibly representing age indeterminate ischemic change. Overall however, there is relative preservation of the gray-white differentiation within the left MCA territory without suggestion of a large infarct visible at this time. Increased density along the falx and tentorium is favored to represent sequelae of contrast administration.  05/18/2014  Atrophy and minimal chronic microvascular ischemia. No acute abnormality. Dense left MCA compatible with thrombosis.   Cerebral Angio  05/18/2014  S/P bilateral common carotid arteriograms , followed by complete revascularization of T occlusion of Lt ICA terminus ,Lt MCA and Lt ACA with 12mg  of superselective IA TPA , and xi pass with Trevoprovue 4 x 30 mm ,and x 1 pass with Solitaire FR 4 mm x 40 mm with TICI 3 revascularization  MRI head Areas of acute infarction remain following revascularization of the LEFT ICA, ACA, and MCA, involving the medial LEFT temporal lobe uncus and LEFT basal ganglia. No  hemorrhagic transformation. No other areas of convexity cortical infarction are observed. Atrophy and small vessel disease, similar to priors.  2D echo - Left ventricle: The cavity size was mildly dilated. Wall thickness was increased in a pattern of moderate LVH. There was mild concentric hypertrophy. Systolic function was mildly reduced. The estimated ejection fraction was in the range of 45% to 50%. There is hypokinesis of the inferior and apical myocardium. Doppler parameters are consistent with high ventricular filling pressure. - Mitral valve: Calcified annulus. Mildly thickened leaflets. There was mild regurgitation. - Left atrium: The atrium was mildly dilated. - Right ventricle: The cavity size was at the upper limits ofnormal. Wall thickness was mildly increased. - Right atrium: The atrium was mildly dilated. - Pulmonary arteries: Systolic pressure was mildly to moderatelyincreased. PA peak pressure: 47 mm Hg (S).  Component     Latest Ref Rng 05/19/2014 06/22/2014  Cholesterol     0 - 200 mg/dL 124   Triglycerides     <150 mg/dL 142   HDL     >39 mg/dL 38 (L)   Total CHOL/HDL Ratio      3.3   VLDL     0 - 40 mg/dL 28   LDL (calc)     0 - 99 mg/dL 58   Hgb A1c MFr Bld     <  5.7 % 6.3 (H)   Mean Plasma Glucose     <117 mg/dL 134 (H)     Assessment: As you may recall, he is a 73 y.o. Caucasian male with PMH of DM, HTN, HLD, VT, BPH was admitted on 05/18/14 for left MCA CVA treated with IV tPA and mechanical revascularization. He developed a fibrillation during admission and was later put on eliquis. Finished inpt rehab and currently has outpt PT/OT. Recovered reasonably well. Will do TCD to evaluate blood flow.  Plan:  - continue eliquis and lipitor for stroke prevention - check BP and pulse at home. Bradycardia today in clinic - Follow up with your primary care physician for stroke risk factor modification. Recommend maintain blood pressure goal <130/80, diabetes with  hemoglobin A1c goal below 6.5% and lipids with LDL cholesterol goal below 70 mg/dL.  - continue PT/OT/speech - TCD to check intracranial flow - RTC in 3 months.  Orders Placed This Encounter  Procedures  . [place] TCD COMPLETE    Standing Status: Future     Number of Occurrences:      Standing Expiration Date: 09/03/2015    Order Specific Question:  Reason for Exam (SYMPTOM  OR DIAGNOSIS REQUIRED)    Answer:  stroke s/p mechanical thrombetomy    Order Specific Question:  Preferred imaging location?    Answer:  Internal    No orders of the defined types were placed in this encounter.    Patient Instructions  - continue eliquis and lipitor for stroke prevention - check BP and pulse at home 8-9am for at least a week to see the trend. - Follow up with your primary care physician for stroke risk factor modification. Recommend maintain blood pressure goal <130/80, diabetes with hemoglobin A1c goal below 6.5% and lipids with LDL cholesterol goal below 70 mg/dL.  - continue PT/OT/speech - TCD to check intracranial vessels. - follow up in 3 months.     Rosalin Hawking, MD PhD [facility] [address], [place], [place] [address] [phone]

## 2014-07-16 ENCOUNTER — Encounter: Payer: Self-pay | Admitting: Internal Medicine

## 2014-07-20 ENCOUNTER — Other Ambulatory Visit: Payer: Self-pay | Admitting: Internal Medicine

## 2014-07-22 ENCOUNTER — Ambulatory Visit (INDEPENDENT_AMBULATORY_CARE_PROVIDER_SITE_OTHER): Payer: Medicare Other

## 2014-07-22 DIAGNOSIS — I639 Cerebral infarction, unspecified: Secondary | ICD-10-CM

## 2014-08-16 ENCOUNTER — Encounter: Payer: Self-pay | Admitting: Internal Medicine

## 2014-08-24 ENCOUNTER — Ambulatory Visit (INDEPENDENT_AMBULATORY_CARE_PROVIDER_SITE_OTHER): Payer: Medicare Other | Admitting: Internal Medicine

## 2014-08-24 ENCOUNTER — Encounter: Payer: Self-pay | Admitting: *Deleted

## 2014-08-24 ENCOUNTER — Encounter: Payer: Self-pay | Admitting: Internal Medicine

## 2014-08-24 ENCOUNTER — Encounter (INDEPENDENT_AMBULATORY_CARE_PROVIDER_SITE_OTHER): Payer: Self-pay

## 2014-08-24 VITALS — BP 111/66 | HR 56 | Ht 67.0 in | Wt 199.2 lb

## 2014-08-24 DIAGNOSIS — I480 Paroxysmal atrial fibrillation: Secondary | ICD-10-CM

## 2014-08-24 DIAGNOSIS — Z01812 Encounter for preprocedural laboratory examination: Secondary | ICD-10-CM

## 2014-08-24 DIAGNOSIS — I4891 Unspecified atrial fibrillation: Secondary | ICD-10-CM

## 2014-08-24 NOTE — Progress Notes (Signed)
Patient Care Team: Kirk Ruths, MD as PCP - General (Unknown Physician Specialty)   HPI  Austin Warren is a 74 y.o. male Seen in followup for a ventricular tachycardia with a right bundle superior axis relatively narrow QRS complex and positive concordance suggesting a septal origin and possible verapamil sensitivity. Catheterization had demonstrated normal coronary arteries and normal left ventricular function. Signal average Electrocardiogram was markedly abnormal.  MRI was normal in 2012 Echocardiogram 11/15 demonstrated left ventricular hypertrophy with an ejection fraction of 45-50% with hypokinesis of the inferior apical myocardium   We initially tried him on a beta blocker prescription. He had recurrent ventricular tachycardia. He was put on verapamil.  He has had no significant recurrences of ventricular tachycardia  He has had intercurrent stroke having been hospitalized 11/15. He was found to be in atrial fibrillation. He was treated with TPA. Apixaban was initiated.       Past Medical History  Diagnosis Date  . Hypertension   . Hyperlipidemia   . History of colon polyps   . Benign prostatic hypertrophy   . Arthritis   . Ventricular tachycardia     RBB/LAHB ideopathic VT, 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  . Melanoma     under arm  . Osteoarthritis   . Dyspnea on exertion 11/01/2011  . Stroke 05/2014  . UTI (urinary tract infection)   . Diabetes mellitus     type 2    Past Surgical History  Procedure Laterality Date  . Replacement total knee    . Vasectomy    . Joint replacement      R TKR  . Replacement total knee Left   . Radiology with anesthesia N/A 05/18/2014    Procedure: RADIOLOGY WITH ANESTHESIA;  Surgeon: Rob Hickman, MD;  Location: [facility];  Service: Radiology;  Laterality: N/A;    Current Outpatient Prescriptions  Medication Sig Dispense Refill  . apixaban (ELIQUIS) 5 MG TABS tablet Take 1 tablet (5 mg total) by mouth 2 (two) times  daily. 60 tablet 2  . Ascorbic Acid (VITAMIN C) 500 MG tablet Take 500 mg by mouth daily.      Marland Kitchen atorvastatin (LIPITOR) 20 MG tablet Take 1 tablet (20 mg total) by mouth daily at 6 PM. 30 tablet 2  . carvedilol (COREG) 6.25 MG tablet Take 6.25 mg by mouth 2 (two) times daily with a meal. 8am and 5pm    . Cholecalciferol (VITAMIN D3) 1000 UNITS CAPS Take 1,000 Units by mouth daily.     . finasteride (PROSCAR) 5 MG tablet Take 5 mg by mouth daily.      . hydrocortisone (ANUSOL-HC) 2.5 % rectal cream Place rectally 4 (four) times daily as needed for hemorrhoids or itching. (Patient taking differently: Place 1 application rectally as needed for hemorrhoids or itching. ) 30 g 0  . lisinopril (PRINIVIL,ZESTRIL) 2.5 MG tablet Take 1 tablet (2.5 mg total) by mouth daily. 30 tablet 11  . metFORMIN (GLUCOPHAGE) 500 MG tablet Take 500 mg by mouth 2 (two) times daily with a meal.      . Multiple Vitamin (MULTIVITAMIN WITH MINERALS) TABS tablet Take 1 tablet by mouth daily. One A Day 50+    . Tamsulosin HCl (FLOMAX) 0.4 MG CAPS Take 0.8 mg by mouth daily after breakfast.     . torsemide (DEMADEX) 10 MG tablet Take 10 mg by mouth daily.     . verapamil (CALAN-SR) 180 MG CR tablet TAKE ONE TABLET AT BEDTIME 30 tablet 6  No current facility-administered medications for this visit.    Allergies  Allergen Reactions  . Ciprofloxacin Anaphylaxis  . Levofloxacin Anaphylaxis    "throat closes"  . Enalapril Maleate Swelling    Angioedema  . Metoprolol Swelling    edema  . Sulfa Antibiotics Rash    Review of Systems negative except from HPI and PMH  Physical Exam BP 111/66 mmHg  Pulse 56  Ht 5\' 7"  (1.702 m)  Wt 199 lb 4 oz (90.379 kg)  BMI 31.20 kg/m2 Well developed and morbidly obese in no distress HENT normal E scleral and icterus clear Neck Supple JVP-Can't see Clear to ausculation  Regular rate and rhythm, no murmurs gallops or rub Soft with active bowel sounds No clubbing cyanosis Trace  Edema Alert and oriented, grossly normal motor and sensory function Skin Warm and mildly diaphoretic  Sinus rhythm at 59  21/1/41 Low-voltage limb leads    Assessment and  Plan  Ventricular tachycardia  Low-voltage ECG  Obesity   I remain concerned about amyloid. We'll undertake serum free light chains. Will check renal function with the anticipation of receiving he is cardiac MRI. I will discuss with colleagues the role of cath had biopsies  No intercurrent ventricular tachycardia; continue verapamil

## 2014-08-24 NOTE — Patient Instructions (Addendum)
Your physician has recommended that you have a Cardioversion (DCCV). Electrical Cardioversion uses a jolt of electricity to your heart either through paddles or wired patches attached to your chest. This is a controlled, usually prescheduled, procedure. Defibrillation is done under light anesthesia in the hospital, and you usually go home the day of the procedure. This is done to get your heart back into a normal rhythm. You are not awake for the procedure. Please see the instruction sheet given to you today.  Your physician recommends that you have labs today:  CBC  BMP  INR

## 2014-08-25 ENCOUNTER — Telehealth: Payer: Self-pay | Admitting: *Deleted

## 2014-08-25 LAB — BASIC METABOLIC PANEL
BUN/Creatinine Ratio: 14 (ref 10–22)
BUN: 12 mg/dL (ref 8–27)
CO2: 24 mmol/L (ref 18–29)
CREATININE: 0.86 mg/dL (ref 0.76–1.27)
Calcium: 9.8 mg/dL (ref 8.6–10.2)
Chloride: 99 mmol/L (ref 97–108)
GFR calc Af Amer: 97 mL/min/{1.73_m2} (ref >59)
GFR, EST NON AFRICAN AMERICAN: 84 mL/min/{1.73_m2} (ref >59)
GLUCOSE: 124 mg/dL — AB (ref 65–99)
POTASSIUM: 5.2 mmol/L (ref 3.5–5.2)
SODIUM: 142 mmol/L (ref 134–144)

## 2014-08-25 LAB — CBC WITH DIFFERENTIAL/PLATELET
BASOS ABS: 0 10*3/uL (ref 0.0–0.2)
Basos: 1 %
EOS: 2 %
Eosinophils Absolute: 0.1 10*3/uL (ref 0.0–0.4)
HCT: 46.6 % (ref 37.5–51.0)
Hemoglobin: 15.7 g/dL (ref 12.6–17.7)
Immature Grans (Abs): 0 10*3/uL (ref 0.0–0.1)
Immature Granulocytes: 0 %
LYMPHS ABS: 1.6 10*3/uL (ref 0.7–3.1)
Lymphs: 20 %
MCH: 31 pg (ref 26.6–33.0)
MCHC: 33.7 g/dL (ref 31.5–35.7)
MCV: 92 fL (ref 79–97)
MONOS ABS: 0.7 10*3/uL (ref 0.1–0.9)
Monocytes: 10 %
Neutrophils Absolute: 5.2 10*3/uL (ref 1.4–7.0)
Neutrophils Relative %: 67 %
PLATELETS: 181 10*3/uL (ref 150–379)
RBC: 5.07 x10E6/uL (ref 4.14–5.80)
RDW: 13.6 % (ref 12.3–15.4)
WBC: 7.6 10*3/uL (ref 3.4–10.8)

## 2014-08-25 LAB — PROTIME-INR
INR: 1.2 (ref 0.8–1.2)
Prothrombin Time: 12.4 s — ABNORMAL HIGH (ref 9.1–12.0)

## 2014-08-25 NOTE — Telephone Encounter (Signed)
DCCV orders faxed to cath lab  [facility] aware

## 2014-08-26 ENCOUNTER — Ambulatory Visit: Payer: Self-pay | Admitting: Cardiovascular Disease

## 2014-08-26 DIAGNOSIS — I4891 Unspecified atrial fibrillation: Secondary | ICD-10-CM

## 2014-09-14 ENCOUNTER — Ambulatory Visit (INDEPENDENT_AMBULATORY_CARE_PROVIDER_SITE_OTHER): Payer: Medicare Other | Admitting: Internal Medicine

## 2014-09-14 ENCOUNTER — Encounter
Admit: 2014-09-14 | Disposition: A | Discharge status: Home / Self Care | Payer: Self-pay | Attending: Internal Medicine | Admitting: Internal Medicine

## 2014-09-14 ENCOUNTER — Encounter: Payer: Self-pay | Admitting: Internal Medicine

## 2014-09-14 VITALS — BP 108/60 | HR 59 | Ht 67.0 in | Wt 194.2 lb

## 2014-09-14 DIAGNOSIS — E859 Amyloidosis, unspecified: Secondary | ICD-10-CM

## 2014-09-14 DIAGNOSIS — I4891 Unspecified atrial fibrillation: Secondary | ICD-10-CM

## 2014-09-14 MED ORDER — VERAPAMIL HCL ER 120 MG PO TBCR: 120.0000 mg | EXTENDED_RELEASE_TABLET | Freq: Every day | ORAL | Status: DC

## 2014-09-14 MED ORDER — CARVEDILOL 3.125 MG PO TABS: 3.1250 mg | ORAL_TABLET | Freq: Two times a day (BID) | ORAL | Status: DC

## 2014-09-14 NOTE — Patient Instructions (Addendum)
Your physician has recommended you make the following change in your medication:  Decrease Carvedilol to 3.125 mg twice daily  Decrease Verapamil to 120 mg once daily    I will call you with procedure information and follow up this week

## 2014-09-14 NOTE — Progress Notes (Signed)
Patient Care Team: Kirk Ruths, MD as PCP - General (Unknown Physician Specialty)   HPI  Austin Warren is a 74 y.o. male Seen in followup for a ventricular tachycardia with a right bundle superior axis relatively narrow QRS complex and positive concordance suggesting a septal origin and possible verapamil sensitivity. Catheterization had demonstrated normal coronary arteries and normal left ventricular function. Signal average Electrocardiogram was markedly abnormal.  MRI was normal in 2012 Echocardiogram 11/15 demonstrated left ventricular hypertrophy with an ejection fraction of 45-50% with hypokinesis of the inferior apical myocardium   We initially tried him on a beta blocker prescription. He had recurrent ventricular tachycardia. He was put on verapamil.  He has had no significant recurrences of ventricular tachycardia  He has had intercurrent stroke having been hospitalized 11/15. He was found to be in atrial fibrillation. He was treated with TPA. Apixaban was initiated.  He underwent cardioversion from 10/22/2014.   He has noted no improvement in exercise tolerance.  Sometimes at home with blood pressures in the 100 range and he has less energy than once in the 120 range.       Past Medical History  Diagnosis Date  . Hypertension   . Hyperlipidemia   . History of colon polyps   . Benign prostatic hypertrophy   . Arthritis   . Ventricular tachycardia     RBB/LAHB ideopathic VT, 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  . Melanoma     under arm  . Osteoarthritis   . Dyspnea on exertion 11/01/2011  . Stroke 05/2014  . UTI (urinary tract infection)   . Diabetes mellitus     type 2    Past Surgical History  Procedure Laterality Date  . Replacement total knee    . Vasectomy    . Joint replacement      R TKR  . Replacement total knee Left   . Radiology with anesthesia N/A 05/18/2014    Procedure: RADIOLOGY WITH ANESTHESIA;  Surgeon: Rob Hickman, MD;  Location:  [facility];  Service: Radiology;  Laterality: N/A;  . Cardioversion      Current Outpatient Prescriptions  Medication Sig Dispense Refill  . apixaban (ELIQUIS) 5 MG TABS tablet Take 1 tablet (5 mg total) by mouth 2 (two) times daily. 60 tablet 2  . Ascorbic Acid (VITAMIN C) 500 MG tablet Take 500 mg by mouth daily.      Marland Kitchen atorvastatin (LIPITOR) 20 MG tablet Take 1 tablet (20 mg total) by mouth daily at 6 PM. 30 tablet 2  . carvedilol (COREG) 6.25 MG tablet Take 6.25 mg by mouth 2 (two) times daily with a meal. 8am and 5pm    . cefUROXime (CEFTIN) 500 MG tablet Take 500 mg by mouth 2 (two) times daily.    . Cholecalciferol (VITAMIN D3) 1000 UNITS CAPS Take 1,000 Units by mouth daily.     . finasteride (PROSCAR) 5 MG tablet Take 5 mg by mouth daily.      . hydrocortisone (ANUSOL-HC) 2.5 % rectal cream Place rectally 4 (four) times daily as needed for hemorrhoids or itching. (Patient taking differently: Place 1 application rectally as needed for hemorrhoids or itching. ) 30 g 0  . lisinopril (PRINIVIL,ZESTRIL) 2.5 MG tablet Take 1 tablet (2.5 mg total) by mouth daily. 30 tablet 11  . metFORMIN (GLUCOPHAGE) 500 MG tablet Take 500 mg by mouth 2 (two) times daily with a meal.      . Multiple Vitamin (MULTIVITAMIN WITH MINERALS) TABS tablet Take 1  tablet by mouth daily. One A Day 50+    . Tamsulosin HCl (FLOMAX) 0.4 MG CAPS Take 0.8 mg by mouth daily after breakfast.     . torsemide (DEMADEX) 10 MG tablet Take 10 mg by mouth daily.     . verapamil (CALAN-SR) 180 MG CR tablet TAKE ONE TABLET AT BEDTIME 30 tablet 6   No current facility-administered medications for this visit.    Allergies  Allergen Reactions  . Ciprofloxacin Anaphylaxis  . Levofloxacin Anaphylaxis    "throat closes"  . Enalapril Maleate Swelling    Angioedema  . Metoprolol Swelling    edema  . Sulfa Antibiotics Rash    Review of Systems negative except from HPI and PMH  Physical Exam BP 108/60 mmHg  Pulse 59  Ht 5\' 7"   (1.702 m)  Wt 194 lb 4 oz (88.111 kg)  BMI 30.42 kg/m2 Well developed and morbidly obese in no distress HENT normal E scleral and icterus clear Neck Supple JVP-Can't see Clear to ausculation  Regular rate and rhythm, no murmurs gallops or rub Soft with active bowel sounds No clubbing cyanosis Trace Edema Alert and oriented, grossly normal motor and sensory function Skin Warm and mildly diaphoretic  Sinus rhythm at 59  23/10/41 Low-voltage limb leads    Assessment and  Plan  Ventricular tachycardia  Low-voltage ECG  Obesity  Hypotension   Paroxysmal atrial fibrillation   I remain concerned about amyloid.  We will undertake cardiac MRI as well as a fat pad biopsy. I reviewed with the family the issues related to the discordance between ECG voltage LVH noted on echo. With his hypotension and some lethargy associated with lower blood pressures, we will decrease his carvedilol from 6--3.125 twice a day and his verapamil from 180--120.   We spent more than 50% of our >25 min visit in face to face counseling regarding the above    No intercurrent ventricular tachycardia; continue verapamil

## 2014-09-20 ENCOUNTER — Telehealth: Payer: Self-pay

## 2014-09-20 DIAGNOSIS — E859 Amyloidosis, unspecified: Secondary | ICD-10-CM

## 2014-09-20 NOTE — Telephone Encounter (Signed)
LVM to inform patient this will be scheduled tomorrow 3/7

## 2014-09-20 NOTE — Telephone Encounter (Signed)
Pt wife states pt is awaiting a call with some tests that Dr. Caryl Comes has ordered. Please call.

## 2014-09-24 NOTE — Telephone Encounter (Signed)
Pt is wife calling stating that she needs to talk to the nurse, stating that we need to tell her when the tests are schedule.  Stated to the patient she was on another line and She stated that "probably because she has not done it yet" I stated that we would try and call her back soon.  She said that was fine.

## 2014-09-24 NOTE — Telephone Encounter (Signed)
Informed patients wife that the scheduler for this test is out of town until Monday  Will Schedule Monday

## 2014-09-29 NOTE — Telephone Encounter (Signed)
Spoke with MRI and [place] awaiting call back with available dates to schedule test

## 2014-10-04 ENCOUNTER — Ambulatory Visit: Payer: Medicare Other | Admitting: Internal Medicine

## 2014-10-05 ENCOUNTER — Ambulatory Visit (INDEPENDENT_AMBULATORY_CARE_PROVIDER_SITE_OTHER): Payer: Medicare Other | Admitting: Neurology

## 2014-10-05 ENCOUNTER — Encounter: Payer: Self-pay | Admitting: Neurology

## 2014-10-05 VITALS — BP 124/76 | HR 68 | Ht 66.25 in | Wt 188.6 lb

## 2014-10-05 DIAGNOSIS — I639 Cerebral infarction, unspecified: Secondary | ICD-10-CM

## 2014-10-05 DIAGNOSIS — I480 Paroxysmal atrial fibrillation: Secondary | ICD-10-CM | POA: Diagnosis not present

## 2014-10-05 DIAGNOSIS — E785 Hyperlipidemia, unspecified: Secondary | ICD-10-CM | POA: Diagnosis not present

## 2014-10-05 DIAGNOSIS — I10 Essential (primary) hypertension: Secondary | ICD-10-CM | POA: Diagnosis not present

## 2014-10-05 DIAGNOSIS — E1159 Type 2 diabetes mellitus with other circulatory complications: Secondary | ICD-10-CM | POA: Diagnosis not present

## 2014-10-05 NOTE — Progress Notes (Signed)
STROKE NEUROLOGY FOLLOW UP NOTE  NAME: Austin Warren DOB: [birth year]  REASON FOR VISIT: stroke follow up HISTORY FROM: wife and chart  Today we had the pleasure of seeing MATTEUS MCNELLY in follow-up at our Neurology Clinic. Pt was accompanied by wife.   History Summary Mr. MONTI JILEK is a 74 y.o. male with PMH of DM, HTN, HLD, VT, BPH was admitted on 05/18/14 for left gaze preference, right facial droop, right sided plegia. Initial CT head showed no acute infarct but did demonstrate a large dense left MCA sign. IR was contacted. IV tPA was started and patient was brought back to IR for cerebral angiogram. NIH stroke score was 23. EKG showed atrial fibrillation, which had not previously been known. Patient was administered IV TPA followed by cerebral angio in IR where he was found to have a T occlusion with complete revascularization of Lt ICA terminus, Lt MCA and Lt ACA with 12mg  of IA TPA, 1 pass with Trevoprovue, and 1 pass with Solitaire withTICI 3 revascularization. MRI showed acute infarction involving the medial LEFT temporal lobe uncus and LEFT basal ganglia. After stabilization, he was put on eliquis 5mg  bid. Cardiology was consulted for management of new onset afib and he was put on BB and ACEI and continued verapamil for VT. Not able to tolerant foley removal so he was put back on foley catheter, and discharged to CIR.  07/02/14 follow up - he was readmitted on 05/27/14 due to confusion and high grade fever. Found to have urosepsis, put on empiric coverage to complete a 10 day course - narrowed to rocephin w/ transition to ceftin at time of d/c. Foley finally removed on 05/29/14. He was sent back to rehab and he stayed for another 10 days and discharged home. He went to see Dr. Estanislado Pandy on 06/14/14 and planning to repeat MRA in some time. He also followed up with Dr. Caryl Comes in cardiology and continued on eliquis. He is tolerating eliquis well without side effects. His BP  today 125/71.  Interval History During the interval time, he was doing well. He had cardioversion last month and he felt his heart back to sinus rhythm almost all the time. he followed with Dr. 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 His Corag and verapamil dose decreased due to lethargy and hypotension. Since then, he has been feeling good. His BP 124/76 today in clinic.  REVIEW OF SYSTEMS: Full 14 system review of systems performed and notable only for those listed below and in HPI above, all others are negative:  Constitutional: N/A  Cardiovascular: N/A  Ear/Nose/Throat: N/A  Skin: N/A  Eyes: N/A  Respiratory: N/A  Gastroitestinal: N/A  Genitourinary: N/A Hematology/Lymphatic: N/A  Endocrine: N/A  Musculoskeletal: N/A  Allergy/Immunology: N/A  Neurological: N/A  Psychiatric: N/A  The following represents the patient's updated allergies and side effects list: Allergies  Allergen Reactions  . Ciprofloxacin Anaphylaxis  . Levofloxacin Anaphylaxis    "throat closes"  . Enalapril Maleate Swelling    Angioedema  . Metoprolol Swelling    edema  . Sulfa Antibiotics Rash    The neurologically relevant items on the patient's problem list were reviewed on today's visit.  Neurologic Examination  A problem focused neurological exam (12 or more points of the single system neurologic examination, vital signs counts as 1 point, cranial nerves count for 8 points) was performed.  Blood pressure 124/76, pulse 68, height 5'  6.25" (1.683 m), weight 188 lb 9.6 oz (85.548 kg), SpO2 97 %.  General - Well nourished, well developed, in no apparent distress.  Ophthalmologic - not able to see through.  Cardiovascular - Regular rate and rhythm with no murmur, but bradycardia.  Mental Status -  Level of arousal and orientation to time, place, and person were intact. Language was assessed and found to be intact in expression,  comprehension, naming and repetition.  Cranial Nerves II - XII - II - Visual field intact OU. III, IV, VI - Extraocular movements intact. V - Facial sensation intact bilaterally. VII - Facial movement intact bilaterally. VIII - Hearing & vestibular intact bilaterally. X - Palate elevates symmetrically. XI - Chin turning & shoulder shrug intact bilaterally. XII - Tongue protrusion intact.  Motor Strength - The patient's strength was normal in all extremities except mildly decreased right hand dexterity and right 5-/5 and pronator drift was absent.  Bulk was normal and fasciculations were absent.   Motor Tone - Muscle tone was assessed at the neck and appendages and was normal.  Reflexes - The patient's reflexes were normal in all extremities and he had no pathological reflexes.  Sensory - Light touch, temperature/pinprick were assessed and were decreased on the right.    Coordination - The patient had normal movements in the hands with no ataxia or dysmetria.  Tremor was absent.  Gait and Station - Mild right hemiparetic gait.  Data reviewed: I personally reviewed the images and agree with the radiology interpretations.  Ct Head Wo Contrast 05/18/2014  Suggestion of minimal hypodensity within the left basal ganglia region possibly representing age indeterminate ischemic change. Overall however, there is relative preservation of the gray-white differentiation within the left MCA territory without suggestion of a large infarct visible at this time. Increased density along the falx and tentorium is favored to represent sequelae of contrast administration.  05/18/2014  Atrophy and minimal chronic microvascular ischemia. No acute abnormality. Dense left MCA compatible with thrombosis.   Cerebral Angio  05/18/2014  S/P bilateral common carotid arteriograms , followed by complete revascularization of T occlusion of Lt ICA terminus ,Lt MCA and Lt ACA with 12mg  of superselective  IA TPA , and xi pass with Trevoprovue 4 x 30 mm ,and x 1 pass with Solitaire FR 4 mm x 40 mm with TICI 3 revascularization  MRI head Areas of acute infarction remain following revascularization of the LEFT ICA, ACA, and MCA, involving the medial LEFT temporal lobe uncus and LEFT basal ganglia. No hemorrhagic transformation. No other areas of convexity cortical infarction are observed. Atrophy and small vessel disease, similar to priors.  2D echo - Left ventricle: The cavity size was mildly dilated. Wall thickness was increased in a pattern of moderate LVH. There was mild concentric hypertrophy. Systolic function was mildly reduced. The estimated ejection fraction was in the range of 45% to 50%. There is hypokinesis of the inferior and apical myocardium. Doppler parameters are consistent with high ventricular filling pressure. - Mitral valve: Calcified annulus. Mildly thickened leaflets. There was mild regurgitation. - Left atrium: The atrium was mildly dilated. - Right ventricle: The cavity size was at the upper limits ofnormal. Wall thickness was mildly increased. - Right atrium: The atrium was mildly dilated. - Pulmonary arteries: Systolic pressure was mildly to moderatelyincreased. PA peak pressure: 47 mm Hg (S).  TCD 07/22/14 - diffuse athero, no evidence to suggest large vessel occlusion.  Component     Latest Ref Rng 05/19/2014 06/22/2014  Cholesterol     0 - 200 mg/dL 124   Triglycerides     <150 mg/dL 142   HDL     >39 mg/dL 38 (L)   Total CHOL/HDL Ratio      3.3   VLDL     0 - 40 mg/dL 28   LDL (calc)     0 - 99 mg/dL 58   Hgb A1c MFr Bld     <5.7 % 6.3 (H)   Mean Plasma Glucose     <117 mg/dL 134 (H)     Assessment: As you may recall, he is a 74 y.o. Caucasian male with PMH of DM, HTN, HLD, VT, BPH was admitted on 05/18/14 for left MCA CVA treated with IV tPA and mechanical revascularization. He was found to have afib fibrillation during admission and was later put on  eliquis. Recovered reasonably well. We will continue Eliquis and Lipitor.   Plan:  - continue eliquis and lipitor for stroke prevention - follow up with cardiology for A. fib and V. tach - Follow up with your primary care physician for stroke risk factor modification. Recommend maintain blood pressure goal <130/80, diabetes with hemoglobin A1c goal below 6.5% and lipids with LDL cholesterol goal below 70 mg/dL.  - check BP at home. - RTC in 6 months.  No orders of the defined types were placed in this encounter.    No orders of the defined types were placed in this encounter.    Patient Instructions  - continue eliquis and lipitor for stroke prevention - follow up with Dr. Caryl Comes for afib and Tanna Furry - Follow up with your primary care physician for stroke risk factor modification. Recommend maintain blood pressure goal <130/80, diabetes with hemoglobin A1c goal below 6.5% and lipids with LDL cholesterol goal below 70 mg/dL.  - continue regular exercise, including writing - check BP at home - follow up in 6 months.     Rosalin Hawking, MD PhD [facility] [address], [place], [place] [address] [phone]

## 2014-10-05 NOTE — Patient Instructions (Addendum)
-   continue eliquis and lipitor for stroke prevention - follow up with Dr. Caryl Comes for afib and Tanna Furry - Follow up with your primary care physician for stroke risk factor modification. Recommend maintain blood pressure goal <130/80, diabetes with hemoglobin A1c goal below 6.5% and lipids with LDL cholesterol goal below 70 mg/dL.  - continue regular exercise, including writing - check BP at home - follow up in 6 months.

## 2014-10-08 ENCOUNTER — Telehealth: Payer: Self-pay | Admitting: *Deleted

## 2014-10-08 NOTE — Telephone Encounter (Signed)
Correction:  Per Dr. Caryl Comes patient can hold eliquis the night before and the morning of procedure

## 2014-10-08 NOTE — Telephone Encounter (Signed)
-----   Message from Stanton Kidney, RN sent at 10/05/2014  2:47 PM EDT ----- Dr. Caryl Comes states patient should hold Eliquis the night before and morning of biopsy.

## 2014-10-08 NOTE — Telephone Encounter (Signed)
Patients cardiac MRI scheduled for 10/20/14 at 1 pm pt to arrive at 12 pm LVM to inform patient of date and time    LVM with Alisha in Ultrasound scheduling to schedule fat pad biopsy and Inform her per Dr. Caryl Comes patient is approved to hold Eliquis 2 days before biopsy

## 2014-10-13 NOTE — Telephone Encounter (Signed)
Lars Mage [phone]) from ultrasound called and stated she has scheduled patient for fat pad biopsy  She discussed time and date with patient   I left a voicemail to instruct patient to hold eliquis the night before the procedure as well as the morning of procedure

## 2014-10-14 ENCOUNTER — Ambulatory Visit (HOSPITAL_COMMUNITY): Payer: Medicare Other

## 2014-10-14 ENCOUNTER — Inpatient Hospital Stay (HOSPITAL_COMMUNITY): Admission: RE | Admit: 2014-10-14 | Payer: Medicare Other | Source: Ambulatory Visit

## 2014-10-14 NOTE — Telephone Encounter (Signed)
S/W  Pt's wife is aware to hold Eliquis the night before the test and the am of the test.  Pt's wife stated verbal understanding.

## 2014-10-14 NOTE — Telephone Encounter (Signed)
Pt is returning our call. Please call patient.

## 2014-10-15 ENCOUNTER — Encounter
Admit: 2014-10-15 | Disposition: A | Discharge status: Home / Self Care | Payer: Self-pay | Attending: Internal Medicine | Admitting: Internal Medicine

## 2014-10-20 ENCOUNTER — Ambulatory Visit (HOSPITAL_COMMUNITY)
Admission: RE | Admit: 2014-10-20 | Discharge: 2014-10-20 | Disposition: A | Discharge status: Home / Self Care | Payer: Medicare Other | Source: Ambulatory Visit | Attending: Internal Medicine | Admitting: Internal Medicine

## 2014-10-20 ENCOUNTER — Other Ambulatory Visit: Payer: Self-pay | Admitting: Radiology

## 2014-10-20 DIAGNOSIS — E854 Organ-limited amyloidosis: Secondary | ICD-10-CM | POA: Insufficient documentation

## 2014-10-20 DIAGNOSIS — Z87891 Personal history of nicotine dependence: Secondary | ICD-10-CM

## 2014-10-20 DIAGNOSIS — Z79899 Other long term (current) drug therapy: Secondary | ICD-10-CM | POA: Insufficient documentation

## 2014-10-20 DIAGNOSIS — I081 Rheumatic disorders of both mitral and tricuspid valves: Secondary | ICD-10-CM | POA: Diagnosis not present

## 2014-10-20 DIAGNOSIS — I10 Essential (primary) hypertension: Secondary | ICD-10-CM | POA: Diagnosis not present

## 2014-10-20 DIAGNOSIS — I4891 Unspecified atrial fibrillation: Secondary | ICD-10-CM | POA: Insufficient documentation

## 2014-10-20 DIAGNOSIS — E785 Hyperlipidemia, unspecified: Secondary | ICD-10-CM | POA: Diagnosis not present

## 2014-10-20 DIAGNOSIS — Z96652 Presence of left artificial knee joint: Secondary | ICD-10-CM | POA: Insufficient documentation

## 2014-10-20 DIAGNOSIS — Z7901 Long term (current) use of anticoagulants: Secondary | ICD-10-CM

## 2014-10-20 DIAGNOSIS — M1990 Unspecified osteoarthritis, unspecified site: Secondary | ICD-10-CM

## 2014-10-20 DIAGNOSIS — Z8673 Personal history of transient ischemic attack (TIA), and cerebral infarction without residual deficits: Secondary | ICD-10-CM

## 2014-10-20 DIAGNOSIS — N400 Enlarged prostate without lower urinary tract symptoms: Secondary | ICD-10-CM | POA: Insufficient documentation

## 2014-10-20 DIAGNOSIS — E119 Type 2 diabetes mellitus without complications: Secondary | ICD-10-CM | POA: Insufficient documentation

## 2014-10-20 DIAGNOSIS — I43 Cardiomyopathy in diseases classified elsewhere: Secondary | ICD-10-CM

## 2014-10-20 DIAGNOSIS — E859 Amyloidosis, unspecified: Secondary | ICD-10-CM

## 2014-10-20 LAB — CREATININE, SERUM: Creatinine, Ser: 0.67 mg/dL (ref 0.50–1.35)

## 2014-10-20 MED ORDER — GADOBENATE DIMEGLUMINE 529 MG/ML IV SOLN
27.0000 mL | Freq: Once | INTRAVENOUS | Status: AC
  Administered 2014-10-20: 27 mL via INTRAVENOUS

## 2014-10-21 ENCOUNTER — Ambulatory Visit (HOSPITAL_COMMUNITY)
Admission: RE | Admit: 2014-10-21 | Discharge: 2014-10-21 | Disposition: A | Discharge status: Home / Self Care | Payer: Medicare Other | Source: Ambulatory Visit | Attending: Interventional Radiology | Admitting: Interventional Radiology

## 2014-10-21 ENCOUNTER — Encounter (HOSPITAL_COMMUNITY): Payer: Self-pay

## 2014-10-21 DIAGNOSIS — E859 Amyloidosis, unspecified: Secondary | ICD-10-CM | POA: Insufficient documentation

## 2014-10-21 DIAGNOSIS — E854 Organ-limited amyloidosis: Secondary | ICD-10-CM | POA: Diagnosis not present

## 2014-10-21 LAB — CBC
HCT: 42.5 % (ref 39.0–52.0)
Hemoglobin: 14.8 g/dL (ref 13.0–17.0)
MCH: 31.6 pg (ref 26.0–34.0)
MCHC: 34.8 g/dL (ref 30.0–36.0)
MCV: 90.6 fL (ref 78.0–100.0)
PLATELETS: 151 10*3/uL (ref 150–400)
RBC: 4.69 MIL/uL (ref 4.22–5.81)
RDW: 13.8 % (ref 11.5–15.5)
WBC: 4.8 10*3/uL (ref 4.0–10.5)

## 2014-10-21 LAB — GLUCOSE, CAPILLARY: Glucose-Capillary: 99 mg/dL (ref 70–99)

## 2014-10-21 LAB — PROTIME-INR
INR: 1.06 (ref 0.00–1.49)
Prothrombin Time: 13.9 seconds (ref 11.6–15.2)

## 2014-10-21 LAB — APTT: aPTT: 22 seconds — ABNORMAL LOW (ref 24–37)

## 2014-10-21 MED ORDER — FENTANYL CITRATE 0.05 MG/ML IJ SOLN
INTRAMUSCULAR | Status: AC | PRN
  Administered 2014-10-21: 25 ug via INTRAVENOUS

## 2014-10-21 MED ORDER — SODIUM CHLORIDE 0.9 % IV SOLN
INTRAVENOUS | Status: DC
  Administered 2014-10-21: 09:00:00 via INTRAVENOUS

## 2014-10-21 MED ORDER — LIDOCAINE-EPINEPHRINE 1 %-1:100000 IJ SOLN
INTRAMUSCULAR | Status: AC
  Filled 2014-10-21: qty 1

## 2014-10-21 MED ORDER — MIDAZOLAM HCL 2 MG/2ML IJ SOLN
INTRAMUSCULAR | Status: AC | PRN
  Administered 2014-10-21: 1 mg via INTRAVENOUS

## 2014-10-21 MED ORDER — MIDAZOLAM HCL 2 MG/2ML IJ SOLN
INTRAMUSCULAR | Status: AC
  Filled 2014-10-21: qty 4

## 2014-10-21 MED ORDER — FENTANYL CITRATE 0.05 MG/ML IJ SOLN
INTRAMUSCULAR | Status: AC
  Filled 2014-10-21: qty 4

## 2014-10-21 NOTE — Procedures (Signed)
Technically successful US guided biopsy of fat pad within the lower anterior abdomen.  No immediate complications.

## 2014-10-21 NOTE — H&P (Signed)
Chief Complaint: Amyloid cardiomyopathy  Referring Physician(s): Klein,Steven C  History of Present Illness: Austin Warren is a 74 y.o. 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 Hypertension   . Hyperlipidemia   . History of colon polyps   . Benign prostatic hypertrophy   . Arthritis   . Ventricular tachycardia     RBB/LAHB ideopathic VT, 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  . Melanoma     under arm  . Osteoarthritis   . Dyspnea on exertion 11/01/2011  . Stroke 05/2014  . UTI (urinary tract infection)   . Diabetes mellitus     type 2    Past Surgical History  Procedure Laterality Date  . Replacement total knee    . Vasectomy    . Joint replacement      R TKR  . Replacement total knee Left   . Radiology with anesthesia N/A 05/18/2014    Procedure: RADIOLOGY WITH ANESTHESIA;  Surgeon: Rob Hickman, MD;  Location: [place];  Service: Radiology;  Laterality: N/A;  . Cardioversion      Allergies: Ciprofloxacin; Levofloxacin; Enalapril maleate; Metoprolol; and Sulfa antibiotics  Medications: Prior to Admission medications   Medication Sig Start Date End Date Taking? Authorizing Provider  apixaban (ELIQUIS) 5 MG TABS tablet Take 1 tablet (5 mg total) by mouth 2 (two) times daily. 05/25/14  Yes Donzetta Starch, NP  Ascorbic Acid (VITAMIN C) 500 MG tablet Take 500 mg by mouth daily.     Yes Historical Provider, MD  atorvastatin (LIPITOR) 20 MG tablet Take 1 tablet (20 mg total) by mouth daily at 6 PM. 05/25/14  Yes Donzetta Starch, NP  carvedilol (COREG) 3.125 MG tablet Take 1 tablet (3.125 mg total) by mouth 2 (two) times daily. 09/14/14  Yes Deboraha Sprang, MD  Cholecalciferol (VITAMIN D3) 1000 UNITS CAPS Take 1,000 Units by mouth daily.    Yes Historical Provider, MD    finasteride (PROSCAR) 5 MG tablet Take 5 mg by mouth daily.     Yes Historical Provider, MD  lisinopril (PRINIVIL,ZESTRIL) 2.5 MG tablet Take 1 tablet (2.5 mg total) by mouth daily. 06/22/14  Yes Liliane Shi, PA-C  metFORMIN (GLUCOPHAGE) 500 MG tablet Take 500 mg by mouth 2 (two) times daily with a meal.     Yes Historical Provider, MD  Multiple Vitamin (MULTIVITAMIN WITH MINERALS) TABS tablet Take 1 tablet by mouth daily. One A Day 50+   Yes Historical Provider, MD  Tamsulosin HCl (FLOMAX) 0.4 MG CAPS Take 0.8 mg by mouth 2 (two) times daily.    Yes Historical Provider, MD  torsemide (DEMADEX) 10 MG tablet Take 10 mg by mouth daily.  02/17/13  Yes Historical Provider, MD  verapamil (CALAN-SR) 120 MG CR tablet Take 1 tablet (120 mg total) by mouth at bedtime. 09/14/14  Yes Deboraha Sprang, MD  hydrocortisone (ANUSOL-HC) 2.5 % rectal cream Place rectally 4 (four) times daily as needed for hemorrhoids or itching. Patient not taking: Reported on 10/05/2014 05/31/14   Cherene Altes, MD     Family History  Problem Relation Age of Onset  . Stroke Father   . Heart attack Neg Hx   . Hypertension Father     History   Social History  . Marital  Status: Married    Spouse Name: N/A  . Number of Children: 2  . Years of Education: BS   Social History Main Topics  . Smoking status: Former Research scientist (life sciences)  . Smokeless tobacco: Never Used     Comment: 05/2014    quit smoking  45 years ago  . Alcohol Use: No  . Drug Use: No  . Sexual Activity: Not on file   Other Topics Concern  . None   Social History Narrative   Patient is married with 1 living and 1 deceased child.   Patient is right handed.   Patient has BS degree.   Patient 1 cup daily.    Review of Systems: A 12 point ROS discussed and pertinent positives are indicated in the HPI above.  All other systems are negative.  Review of Systems  Constitutional: Negative for fever, activity change and appetite change.  HENT: Negative for facial  swelling, tinnitus and trouble swallowing.   Respiratory: Positive for shortness of breath. Negative for cough and chest tightness.   Gastrointestinal: Negative for nausea and abdominal pain.  Genitourinary: Negative for difficulty urinating.  Musculoskeletal: Positive for gait problem. Negative for back pain.  Neurological: Positive for weakness. Negative for dizziness, facial asymmetry, speech difficulty and headaches.  Psychiatric/Behavioral: Negative for behavioral problems and confusion.    Vital Signs: BP 117/70 mmHg  Pulse 57  Temp(Src) 98.5 F (36.9 C) (Oral)  Ht 5\' 7"  (1.702 m)  Wt 80.74 kg (178 lb)  BMI 27.87 kg/m2  SpO2 98%  Physical Exam  Constitutional: He is oriented to person, place, and time. He appears well-nourished.  Cardiovascular: Normal rate and regular rhythm.   No murmur heard. Pulmonary/Chest: Effort normal. He has wheezes.  Abdominal: Soft. Bowel sounds are normal. There is tenderness.  Musculoskeletal: Normal range of motion.  Rt side weakness  Neurological: He is alert and oriented to person, place, and time.  Skin: Skin is warm and dry.  Psychiatric: He has a normal mood and affect. His behavior is normal. Judgment and thought content normal.  Nursing note and vitals reviewed.   Mallampati Score:  MD Evaluation Airway: WNL Heart: WNL Abdomen: WNL Chest/ Lungs: WNL ASA  Classification: 2 Mallampati/Airway Score: Two  Imaging: Mr Card Morphology Wo/w Cm  10/20/2014   CLINICAL DATA:  74 year old male with ventricular tachycardias, normal cardiac catheterization and low voltage ECG. Evaluate for cardiac amyloidosis.  EXAM: CARDIAC MRI  TECHNIQUE: The patient was scanned on a 1.5 Tesla GE magnet. A dedicated cardiac coil was used. Functional imaging was done using Fiesta sequences. 2,3, and 4 chamber views were done to assess for RWMA's. Modified Simpson's rule using a short axis stack was used to calculate an ejection fraction on a dedicated work  Conservation officer, nature. The patient received 27 cc of Multihance. After 10 minutes inversion recovery sequences were used to assess for infiltration and scar tissue.  CONTRAST:  27 cc of Multihance  FINDINGS: 1. The left ventricle is mildly dilated with mild concentric hypertrophy and moderate basal septal hypertrophy. There is mild systolic dysfunction (LVEF = 45%) with mild global hypokinesis more pronounced in the basal inferior, inferolateral walls and in the apex.  There is significant thinning of the basal inferior and inferolateral walls corresponding to almost transmural late gadolinium enhancement in these segments.  The measurements are as follows:  LVEDD:  57 mm  LVESD:  44 mm  LVEDV:  171 ml  LVESV:  94 ml  SV:  77 ml  CO:  3.9 L/minute  Myocardial mass:  153 g  2. Normal right ventricular size with mild RV hypertrophy and normal systolic function (RVEF = 67%).  RVEDV:  115 ml  RVESV:  38 ml  SV:  77 ml  CO:  3.9 L/minute  3.  Mild to moderate mitral and mild tricuspid regurgitation.  4.  Moderate left atrial dilatation, mild right atrial dilatation.  5. Normal size of the aortic root (38 mm), ascending aorta (40 mm) and pulmonary artery (27 mm).  IMPRESSION: 1. The left ventricle is mildly dilated with mild concentric hypertrophy and moderate basal septal hypertrophy. There is mild systolic dysfunction (LVEF = 45%) with mild global hypokinesis more pronounced in the basal inferior, inferolateral walls and in the apex. There is significant thinning of the basal inferior and inferolateral walls corresponding to almost transmural late gadolinium enhancement in these segments.  This pattern of late gadolinium enhancement is mostly consistent with prior myocardial infarction in the posterolateral artery. LGE pattern shows low suspicion for cardiac amyloidosis.  2. Normal right ventricular size with mild right ventricular hypertrophy and normal systolic function (RVEF = 67%).  3.  Mild to moderate  mitral and mild tricuspid regurgitation.  4.  Moderate left atrial dilatation, mild right atrial dilatation.  5. Normal size of the aortic root (38 mm), ascending aorta (40 mm) and pulmonary artery (27 mm).  Ena Dawley   Electronically Signed   By: Ena Dawley   On: 10/20/2014 19:15    Labs:  CBC:  Recent Labs  05/31/14 0009 06/22/14 1454 08/24/14 1021 10/21/14 0854  WBC 5.5 6.0 7.6 4.8  HGB 13.1 13.8 15.7 14.8  HCT 39.2 42.2 46.6 42.5  PLT 191 134.0* 181 151    COAGS:  Recent Labs  05/18/14 1504 08/24/14 1021  INR 1.11 1.2  APTT 30  --     BMP:  Recent Labs  05/29/14 0403 05/30/14 0018 06/22/14 1454 08/24/14 1021 10/20/14 1200  NA 140 135* 137 142  --   K 3.5* 3.7 3.8 5.2  --   CL 104 102 103 99  --   CO2 21 20 25 24   --   GLUCOSE 107* 116* 98 124*  --   BUN 12 10 12 12   --   CALCIUM 8.4 8.6 8.7 9.8  --   CREATININE 0.68 0.53 0.8 0.86 0.67  GFRNONAA >90 >90  --  84 >90  GFRAA >90 >90  --  97 >90    LIVER FUNCTION TESTS:  Recent Labs  05/18/14 1504 05/29/14 0403  BILITOT 0.5 0.9  AST 38* 29  ALT 41 25  ALKPHOS 44 69  PROT 6.9 5.7*  ALBUMIN 3.9 2.7*    TUMOR MARKERS: No results for input(s): AFPTM, CEA, CA199, CHROMGRNA in the last 8760 hours.  Assessment and Plan:  Amyloid cardiomyopathy Scheduled for fat pad bx today in IR Off Eliquis (off 2 doses) Risks and Benefits discussed with the patient including, but not limited to bleeding, infection, damage to adjacent structures or low yield requiring additional tests. All of the patient's questions were answered, patient is agreeable to proceed. Consent signed and in chart.   Thank you for this interesting consult.  I greatly enjoyed meeting Austin Warren and look forward to participating in their care.  Signed: Deashia Soule A 10/21/2014, 9:06 AM   I spent a total of  20 Minutes   in face to face in clinical consultation, greater than 50% of which was counseling/coordinating  care for Fat pad bx

## 2014-10-21 NOTE — Discharge Instructions (Signed)
Biopsy Care After Refer to this sheet in the next few weeks. These instructions provide you with information on caring for yourself after your procedure. Your caregiver may also give you more specific instructions. Your treatment has been planned according to current medical practices, but problems sometimes occur. Call your caregiver if you have any problems or questions after your procedure. If you had a fine needle biopsy, you may have soreness at the biopsy site for 1 to 2 days. If you had an open biopsy, you may have soreness at the biopsy site for 3 to 4 days. HOME CARE INSTRUCTIONS   You may resume normal diet and activities as directed.  Change bandages (dressings) as directed. If your wound was closed with a skin glue (adhesive), it will wear off and begin to peel in 7 days.  Only take over-the-counter or prescription medicines for pain, discomfort, or fever as directed by your caregiver.  Ask your caregiver when you can bathe and get your wound wet. SEEK IMMEDIATE MEDICAL CARE IF:   You have increased bleeding (more than a small spot) from the biopsy site.  You notice redness, swelling, or increasing pain at the biopsy site.  You have pus coming from the biopsy site.  You have a fever.  You notice a bad smell coming from the biopsy site or dressing.  You have a rash, have difficulty breathing, or have any allergic problems. MAKE SURE YOU:   Understand these instructions.  Will watch your condition.  Will get help right away if you are not doing well or get worse. Document Released: 01/19/2005 Document Revised: 09/24/2011 Document Reviewed: 12/28/2010 Exit[employer] Patient Information 2015 [employer]. This information is not intended to replace advice given to you by your health care provider. Make sure you discuss any questions you have with your health care provider.  

## 2014-11-11 ENCOUNTER — Telehealth: Payer: Self-pay | Admitting: Internal Medicine

## 2014-11-11 NOTE — Telephone Encounter (Signed)
New message      Want 4-6 and 4-7 test results

## 2014-11-11 NOTE — Telephone Encounter (Signed)
Patient's wife inquiring about cardiac MRI and fat pad biopsy results. Informed them that I have both tests and will have Dr. Caryl Comes review next week when he is back in the office. They are aware I will call them once reviewed and appreciate helping with this.

## 2014-11-16 ENCOUNTER — Ambulatory Visit: Payer: Medicare Other | Admitting: Speech Pathology

## 2014-11-16 ENCOUNTER — Encounter: Payer: Self-pay | Admitting: Speech Pathology

## 2014-11-16 DIAGNOSIS — I69320 Aphasia following cerebral infarction: Secondary | ICD-10-CM | POA: Insufficient documentation

## 2014-11-16 DIAGNOSIS — R4701 Aphasia: Secondary | ICD-10-CM

## 2014-11-16 NOTE — Telephone Encounter (Signed)
Informed of negative fat pad biopsy results. Patient's wife verbalized understanding and agreeable to plan.

## 2014-11-16 NOTE — Therapy (Signed)
[place] MAIN [facility] SERVICES [address] [place], [place], [address] Phone: [phone]   Fax:  [phone]  Speech Language Pathology Treatment  Patient Details  Name: Austin Warren MRN: [id number] Date of Birth: [birth year] Referring Provider:  Kirk Ruths, MD  Encounter Date: 11/16/2014      End of Session - 11/16/14 1230    Visit Number 37   Number of Visits 45   Date for SLP Re-Evaluation 12/08/14   SLP Start Time 1100   SLP Stop Time  7782   SLP Time Calculation (min) 57 min   Activity Tolerance Patient tolerated treatment well      Past Medical History  Diagnosis Date  . Hypertension   . Hyperlipidemia   . History of colon polyps   . Benign prostatic hypertrophy   . Arthritis   . Ventricular tachycardia     RBB/LAHB ideopathic VT, 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  . Melanoma     under arm  . Osteoarthritis   . Dyspnea on exertion 11/01/2011  . Stroke 05/2014  . UTI (urinary tract infection)   . Diabetes mellitus     type 2    Past Surgical History  Procedure Laterality Date  . Replacement total knee    . Vasectomy    . Joint replacement      R TKR  . Replacement total knee Left   . Radiology with anesthesia N/A 05/18/2014    Procedure: RADIOLOGY WITH ANESTHESIA;  Surgeon: Rob Hickman, MD;  Location: [place];  Service: Radiology;  Laterality: N/A;  . Cardioversion      There were no vitals filed for this visit.  Visit Diagnosis: Aphasia      Subjective Assessment - 11/16/14 1218    Subjective Patient reports he can typically convey his meaning, although he uses imprecise words at times.   Currently in Pain? 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 Explain and correct verbal absurdities with 70% accuracy, improves given cues to be more specific and cues for word finding.  Name item given 3 features with 60% accuracy, improves given cues including putting the 3 features into a question form.  Requires max cues to generate multiple meanings for given word.   Assessment / Recommendations / Plan   Plan Continue with current plan of care   Progression Toward Goals   Progression toward goals Progressing toward goals          SLP Education - 11/16/14 1229    Education provided Yes   Education Details strategies to improve independent word retrieval   Person(s) Educated Patient   Methods Explanation   Comprehension Returned demonstration  SLP Long Term Goals - 11/16/14 1233    SLP LONG TERM GOAL #1   Title Patient will generate grammatical and cogent sentence given cognitive-linguistic task with 80% accuracy and min SLP cues.    Time 4   Period Weeks   Status Partially Met   SLP LONG TERM GOAL #2   Title Patient will complete high level word finding tasks with 80% accuracy and min SLP cues.    Time 4   Period Weeks   Status Partially Met   SLP LONG TERM GOAL #3   Title Patient will read sentences and multi-syllabic words aloud, maintaining phonemic accuracy, with 80% accuracy   Time 4   Period Weeks   Status Partially Met   SLP LONG TERM GOAL #4   Title Patient will complete auditory discrimination tasks with 80% accuracy.    Time 4   Period Weeks    Status Partially Met          Plan - 11/16/14 1231    Clinical Impression Statement The patient continues to improve in communicative effectiveness with continued reduced fluency as well as errors in phonology, semantics, and syntax.   Speech Therapy Frequency 2x / week   Duration 4 weeks   Treatment/Interventions Language facilitation;Compensatory strategies;SLP instruction and feedback   Potential to Achieve Goals Good   Potential Considerations Ability to learn/carryover information;Cooperation/participation level;Previous level of function;Family/community support   SLP Home Exercise Plan Patient given suggestions for home practice   Consulted and Agree with Plan of Care Patient        Problem List Patient Active Problem List   Diagnosis Date Noted  . Amyloidosis   . Type 2 diabetes mellitus with other circulatory complications 17/91/5056  . HLD (hyperlipidemia) 07/02/2014  . Stroke   . Atrial fibrillation 05/18/2014  . CVA (cerebral vascular accident) 05/18/2014  . Essential hypertension 05/18/2014  . Diabetes mellitus 05/18/2014  . Acute ischemic stroke   . low-voltage ECG 03/05/2013  . Hypersomnolence 03/05/2013  . Morbid obesity 03/05/2013  . Obesity (BMI 30-39.9) 03/05/2013  . Dyspnea on exertion 11/01/2011  . Edema 11/01/2011  . Ventricular tachycardia 12/01/2010  . Abnormal  signal averaged electrocardiogram 12/01/2010    Lou Miner 11/16/2014, 12:37 PM  [place] MAIN [facility] SERVICES [address] [place], [place], [address] Phone: [phone]   Fax:  [phone]

## 2014-11-18 ENCOUNTER — Encounter: Payer: Self-pay | Admitting: Speech Pathology

## 2014-11-18 ENCOUNTER — Ambulatory Visit: Payer: Medicare Other | Admitting: Speech Pathology

## 2014-11-18 DIAGNOSIS — I69320 Aphasia following cerebral infarction: Secondary | ICD-10-CM | POA: Diagnosis not present

## 2014-11-18 DIAGNOSIS — R4701 Aphasia: Secondary | ICD-10-CM

## 2014-11-18 NOTE — Therapy (Signed)
[place] MAIN [facility] SERVICES [address] [place], [place], [address] Phone: [phone]   Fax:  [phone]  Speech Language Pathology Treatment  Patient Details  Name: Austin Warren MRN: [id number] Date of Birth: 1940-09-16 Referring Provider:  Kirk Ruths, MD  Encounter Date: 11/18/2014      End of Session - 11/18/14 1203    Visit Number 38   Number of Visits 45   Date for SLP Re-Evaluation 12/08/14   SLP Start Time 1052   SLP Stop Time  1145   SLP Time Calculation (min) 53 min   Activity Tolerance Patient tolerated treatment well      Past Medical History  Diagnosis Date  . Hypertension   . Hyperlipidemia   . History of colon polyps   . Benign prostatic hypertrophy   . Arthritis   . Ventricular tachycardia     RBB/LAHB ideopathic VT, 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  . Melanoma     under arm  . Osteoarthritis   . Dyspnea on exertion 11/01/2011  . Stroke 05/2014  . UTI (urinary tract infection)   . Diabetes mellitus     type 2    Past Surgical History  Procedure Laterality Date  . Replacement total knee    . Vasectomy    . Joint replacement      R TKR  . Replacement total knee Left   . Radiology with anesthesia N/A 05/18/2014    Procedure: RADIOLOGY WITH ANESTHESIA;  Surgeon: Rob Hickman, MD;  Location: [place];  Service: Radiology;  Laterality: N/A;  . Cardioversion      Visit Diagnosis: Aphasia      Subjective Assessment - 11/18/14 1208    Subjective Patient reports he can typically convey his meaning, although he uses imprecise words at times.   Currently in Pain? No/denies               ADULT SLP TREATMENT - 11/18/14 1159    Treatment Provided   Treatment provided Cognitive-Linquistic   Pain Assessment   Pain Assessment No/denies pain   Cognitive-Linquistic Treatment   Treatment focused on Aphasia   Skilled Treatment Read phrase aloud, maintaining phonemic accuracy, with 80%  accuracy. Name item given 3 features with 60% accuracy, improves given cues including putting the 3 features into a question form.  Complete moderate verbal analogies with 80% accuracy, given several repetitions of stimulus.  Answer general information questions with 75% accuracy, errors due to word retrieval difficulty.    Assessment / Recommendations / Plan   Plan Continue with current plan of care              SLP Long Term Goals - 11/16/14 1233    SLP LONG TERM GOAL #1   Title Patient will generate grammatical and cogent sentence given cognitive-linguistic task with 80% accuracy and min SLP cues.    Time 4   Period Weeks   Status Partially Met   SLP LONG TERM GOAL #2   Title Patient will complete high level word finding tasks with 80% accuracy and min SLP cues.    Time 4   Period Weeks   Status Partially Met   SLP LONG TERM GOAL #3   Title Patient will read sentences and multi-syllabic words aloud, maintaining phonemic accuracy, with 80% accuracy   Time 4   Period Weeks   Status Partially Met   SLP LONG TERM GOAL #4   Title Patient will complete auditory discrimination  tasks with 80% accuracy.    Time 4   Period Weeks   Status Partially Met          Plan - 11/18/14 1213    Clinical Impression Statement The patient continues to improve in communicative effectiveness with continued reduced fluency as well as errors in phonology, semantics, and syntax.   Speech Therapy Frequency 2x / week   Duration 4 weeks   Treatment/Interventions Language facilitation;Compensatory strategies;SLP instruction and feedback   Potential to Achieve Goals Good   Potential Considerations Ability to learn/carryover information;Cooperation/participation level;Previous level of function;Family/community support   SLP Home Exercise Plan Patient given suggestions for home practice   Consulted and Agree with Plan of Care Patient        Problem List Patient Active Problem List   Diagnosis  Date Noted  . Amyloidosis   . Type 2 diabetes mellitus with other circulatory complications 45/99/7741  . HLD (hyperlipidemia) 07/02/2014  . Stroke   . Atrial fibrillation 05/18/2014  . CVA (cerebral vascular accident) 05/18/2014  . Essential hypertension 05/18/2014  . Diabetes mellitus 05/18/2014  . Acute ischemic stroke   . low-voltage ECG 03/05/2013  . Hypersomnolence 03/05/2013  . Morbid obesity 03/05/2013  . Obesity (BMI 30-39.9) 03/05/2013  . Dyspnea on exertion 11/01/2011  . Edema 11/01/2011  . Ventricular tachycardia 12/01/2010  . Abnormal  signal averaged electrocardiogram 12/01/2010    Lou Miner 11/18/2014, 12:14 PM  [place] MAIN [facility] SERVICES [address] [place], [place], [address] Phone: [phone]   Fax:  [phone]

## 2014-11-23 ENCOUNTER — Encounter: Payer: Self-pay | Admitting: Speech Pathology

## 2014-11-23 ENCOUNTER — Ambulatory Visit: Payer: Medicare Other | Admitting: Speech Pathology

## 2014-11-23 DIAGNOSIS — R4701 Aphasia: Secondary | ICD-10-CM

## 2014-11-23 DIAGNOSIS — I69320 Aphasia following cerebral infarction: Secondary | ICD-10-CM | POA: Diagnosis not present

## 2014-11-23 NOTE — Therapy (Signed)
[place] MAIN [facility] SERVICES [address] [place], [place], [address] Phone: [phone]   Fax:  [phone]  Speech Language Pathology Treatment/Progress Note  Patient Details  Name: Austin Warren MRN: [id number] Date of Birth: 04/21/1941 Referring Provider:  Kirk Ruths, MD  Encounter Date: 11/23/2014      End of Session - 11/23/14 1401    Visit Number 39   Number of Visits 45   Date for SLP Re-Evaluation 12/08/14   SLP Start Time 1052   SLP Stop Time  1145   SLP Time Calculation (min) 53 min   Activity Tolerance Patient tolerated treatment well      Past Medical History  Diagnosis Date  . Hypertension   . Hyperlipidemia   . History of colon polyps   . Benign prostatic hypertrophy   . Arthritis   . Ventricular tachycardia     RBB/LAHB ideopathic VT, 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  . Melanoma     under arm  . Osteoarthritis   . Dyspnea on exertion 11/01/2011  . Stroke 05/2014  . UTI (urinary tract infection)   . Diabetes mellitus     type 2    Past Surgical History  Procedure Laterality Date  . Replacement total knee    . Vasectomy    . Joint replacement      R TKR  . Replacement total knee Left   . Radiology with anesthesia N/A 05/18/2014    Procedure: RADIOLOGY WITH ANESTHESIA;  Surgeon: Rob Hickman, MD;  Location: [place];  Service: Radiology;  Laterality: N/A;  . Cardioversion      There were no vitals filed for this visit.  Visit Diagnosis: Aphasia      Subjective Assessment - 11/23/14 1400    Subjective Patient reports he can typically convey his meaning, although he uses imprecise words at times.   Currently in Pain? No/denies               ADULT SLP TREATMENT - 11/23/14 1358    Treatment Provided   Treatment provided Cognitive-Linquistic   Pain Assessment   Pain Assessment No/denies pain   Cognitive-Linquistic Treatment   Treatment focused on Aphasia   Skilled Treatment  AUDITORY COMPREHENSION: Follow multiple body commands accurately given up to 2 repetitions of command.  VERBAL EXPRESSION: Read phrase aloud, maintaining phonemic accuracy, with 80% accuracy. Name item given 3 features with 70% accuracy, improves given cues including putting the 3 features into a question form.  Complete moderate verbal analogies with 80% accuracy, given several repetitions of stimulus.  Generate cogent and grammatical sentence given picture stimulus with 80% accuracy.  Errors due to abnormal hesitations and awkward phrasing due to word finding problems.   Assessment / Recommendations / Plan   Plan Continue with current plan of care;Goals updated   Progression Toward Goals   Progression toward goals Progressing toward goals          SLP Education - 11/23/14 1400    Education provided Yes   Education Details Strategies to improve independent word retrieval   Person(s) Educated Patient   Methods Explanation   Comprehension Returned demonstration            SLP Long Term Goals - 11/23/14 1406    SLP LONG TERM GOAL #1   Title Patient will generate grammatical and cogent sentence given cognitive-linguistic task with 80% accuracy and min SLP cues.    Time 4   Period Weeks  Status Partially Met   SLP LONG TERM GOAL #2   Title Patient will complete high level word finding tasks with 80% accuracy and min SLP cues.    Time 4   Period Weeks   Status Partially Met   SLP LONG TERM GOAL #3   Title Patient will read sentences and multi-syllabic words aloud, maintaining phonemic accuracy, with 80% accuracy   Time 4   Period Weeks   Status Achieved   SLP LONG TERM GOAL #4   Title Patient will complete auditory discrimination tasks with 80% accuracy.    Time 4   Period Weeks   Status Achieved          Plan - 12/03/2014 1402    Clinical Impression Statement The patient continues to improve in communicative effectiveness with continued reduced fluency as well as errors  in phonology, semantics, and syntax.  He is progressing towards goals and is on track for discharge at the end of the current certification period (12/08/2014   Speech Therapy Frequency 2x / week   Duration 4 weeks   Treatment/Interventions Language facilitation;Compensatory strategies;SLP instruction and feedback   Potential to Achieve Goals Good   Potential Considerations Ability to learn/carryover information;Cooperation/participation level;Previous level of function;Family/community support   SLP Home Exercise Plan Patient given suggestions for home practice   Consulted and Agree with Plan of Care Patient          G-Codes - 03-Dec-2014 1411    Functional Assessment Tool Used Verbal expression therpeutic activities   Functional Limitations Spoken language expressive   Spoken Language Expression Current Status [phone]) At least 20 percent but less than 40 percent impaired, limited or restricted   Spoken Language Expression Goal Status ([id number]) At least 1 percent but less than 20 percent impaired, limited or restricted      Problem List Patient Active Problem List   Diagnosis Date Noted  . Amyloidosis   . Type 2 diabetes mellitus with other circulatory complications 85/92/9244  . HLD (hyperlipidemia) 07/02/2014  . Stroke   . Atrial fibrillation 05/18/2014  . CVA (cerebral vascular accident) 05/18/2014  . Essential hypertension 05/18/2014  . Diabetes mellitus 05/18/2014  . Acute ischemic stroke   . low-voltage ECG 03/05/2013  . Hypersomnolence 03/05/2013  . Morbid obesity 03/05/2013  . Obesity (BMI 30-39.9) 03/05/2013  . Dyspnea on exertion 11/01/2011  . Edema 11/01/2011  . Ventricular tachycardia 12/01/2010  . Abnormal  signal averaged electrocardiogram 12/01/2010    Lou Miner Dec 03, 2014, 2:14 PM  [place] MAIN [facility] SERVICES [address] [place], [place], [address] Phone: [phone]   Fax:  [phone]

## 2014-11-25 ENCOUNTER — Ambulatory Visit: Payer: Medicare Other | Attending: Internal Medicine | Admitting: Speech Pathology

## 2014-11-25 ENCOUNTER — Encounter: Payer: Self-pay | Admitting: Speech Pathology

## 2014-11-25 DIAGNOSIS — R4701 Aphasia: Secondary | ICD-10-CM

## 2014-11-25 DIAGNOSIS — I69320 Aphasia following cerebral infarction: Secondary | ICD-10-CM | POA: Diagnosis not present

## 2014-11-25 NOTE — Therapy (Signed)
[place] [facility] MAIN REHAB SERVICES [address] [place], [place], [address] Phone: [phone]   Fax:  [phone]  Speech Language Pathology Treatment  Patient Details  Name: Austin Warren MRN: [id number] Date of Birth: 12/01/1940 Referring Provider:  Anderson, Marshall W, MD  Encounter Date: 11/25/2014      End of Session - 11/25/14 1339    Visit Number 40   Number of Visits 45   Date for SLP Re-Evaluation 12/08/14   SLP Start Time 1105   SLP Stop Time  1155   SLP Time Calculation (min) 50 min   Activity Tolerance Patient tolerated treatment well      Past Medical History  Diagnosis Date  . Hypertension   . Hyperlipidemia   . History of colon polyps   . Benign prostatic hypertrophy   . Arthritis   . Ventricular tachycardia     RBB/LAHB ideopathic VT, 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  . Melanoma     under arm  . Osteoarthritis   . Dyspnea on exertion 11/01/2011  . Stroke 05/2014  . UTI (urinary tract infection)   . Diabetes mellitus     type 2    Past Surgical History  Procedure Laterality Date  . Replacement total knee    . Vasectomy    . Joint replacement      R TKR  . Replacement total knee Left   . Radiology with anesthesia N/A 05/18/2014    Procedure: RADIOLOGY WITH ANESTHESIA;  Surgeon: Sanjeev K Deveshwar, MD;  Location: [facility];  Service: Radiology;  Laterality: N/A;  . Cardioversion      There were no vitals filed for this visit.  Visit Diagnosis: Aphasia      Subjective Assessment - 11/25/14 1315    Subjective Patient reports he can typically convey his meaning, although he uses imprecise words at times.   Currently in Pain? No/denies               ADULT SLP TREATMENT - 11/25/14 1314    Treatment Provided   Treatment provided Cognitive-Linquistic   Pain Assessment   Pain Assessment No/denies pain   Cognitive-Linquistic Treatment   Treatment focused on Aphasia   Skilled Treatment VERBAL  EXPRESSION: Read phrase aloud, maintaining phonemic accuracy, with 80% accuracy. Complete moderate verbal analogies with 80% accuracy, given several repetitions of stimulus.  Name item given 2-3 descriptive words with 80% accuracy.  Generate cogent and grammatical sentence given picture stimulus with 80% accuracy.  Errors due to abnormal hesitations and awkward phrasing due to word finding problems.   Assessment / Recommendations / Plan   Plan Continue with current plan of care;Goals updated   Progression Toward Goals   Progression toward goals Progressing toward goals          SLP Education - 11/25/14 1346    Education provided Yes   Education Details Strategies to improve independent word retrieval   Person(s) Educated Patient   Methods Explanation   Comprehension Returned demonstration            SLP Long Term Goals - 11/23/14 1406    SLP LONG TERM GOAL #1   Title Patient will generate grammatical and cogent sentence given cognitive-linguistic task with 80% accuracy and min SLP cues.    Time 4   Period Weeks   Status Partially Met   SLP LONG TERM GOAL #2   Title Patient will complete high level word finding tasks with 80% accuracy and min SLP   cues.    Time 4   Period Weeks   Status Partially Met   SLP LONG TERM GOAL #3   Title Patient will read sentences and multi-syllabic words aloud, maintaining phonemic accuracy, with 80% accuracy   Time 4   Period Weeks   Status Achieved   SLP LONG TERM GOAL #4   Title Patient will complete auditory discrimination tasks with 80% accuracy.    Time 4   Period Weeks   Status Achieved          Plan - 11/25/14 1342    Clinical Impression Statement The patient continues to improve in communicative effectiveness with continued reduced fluency as well as errors in phonology, semantics, and syntax.  He is progressing towards goals and is on track for discharge at the end of the current certification period (12/08/2014   Speech Therapy  Frequency 2x / week   Duration 4 weeks   Treatment/Interventions Language facilitation;Compensatory strategies;SLP instruction and feedback   Potential to Achieve Goals Good   Potential Considerations Ability to learn/carryover information;Cooperation/participation level;Previous level of function;Family/community support   SLP Home Exercise Plan Patient given suggestions for home practice   Consulted and Agree with Plan of Care Patient        Problem List Patient Active Problem List   Diagnosis Date Noted  . Amyloidosis   . Type 2 diabetes mellitus with other circulatory complications 10/05/2014  . HLD (hyperlipidemia) 07/02/2014  . Stroke   . Atrial fibrillation 05/18/2014  . CVA (cerebral vascular accident) 05/18/2014  . Essential hypertension 05/18/2014  . Diabetes mellitus 05/18/2014  . Acute ischemic stroke   . low-voltage ECG 03/05/2013  . Hypersomnolence 03/05/2013  . Morbid obesity 03/05/2013  . Obesity (BMI 30-39.9) 03/05/2013  . Dyspnea on exertion 11/01/2011  . Edema 11/01/2011  . Ventricular tachycardia 12/01/2010  . Abnormal  signal averaged electrocardiogram 12/01/2010    G , MS/CCC- SLP  , Susie 11/25/2014, 1:47 PM  [facility] MAIN REHAB SERVICES [address] [place], [place], [address] Phone: [phone]   Fax:  [phone]     

## 2014-11-30 ENCOUNTER — Ambulatory Visit: Payer: Medicare Other | Admitting: Speech Pathology

## 2014-11-30 DIAGNOSIS — R4701 Aphasia: Secondary | ICD-10-CM

## 2014-11-30 DIAGNOSIS — I69320 Aphasia following cerebral infarction: Secondary | ICD-10-CM | POA: Diagnosis not present

## 2014-12-01 ENCOUNTER — Encounter: Payer: Self-pay | Admitting: Speech Pathology

## 2014-12-01 NOTE — Therapy (Signed)
[place] MAIN [facility] SERVICES [address] [place], [place], [address] Phone: [phone]   Fax:  [phone]  Speech Language Pathology Treatment  Patient Details  Name: Austin Warren MRN: [id number] Date of Birth: [birth year] Referring Provider:  Kirk Ruths, MD  Encounter Date: 11/30/2014      End of Session - 12/01/14 1142    Visit Number 41   Number of Visits 45   Date for SLP Re-Evaluation 12/08/14   SLP Start Time 1104   SLP Stop Time  1156   SLP Time Calculation (min) 52 min   Activity Tolerance Patient tolerated treatment well      Past Medical History  Diagnosis Date  . Hypertension   . Hyperlipidemia   . History of colon polyps   . Benign prostatic hypertrophy   . Arthritis   . Ventricular tachycardia     RBB/LAHB ideopathic VT, 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  . Melanoma     under arm  . Osteoarthritis   . Dyspnea on exertion 11/01/2011  . Stroke 05/2014  . UTI (urinary tract infection)   . Diabetes mellitus     type 2    Past Surgical History  Procedure Laterality Date  . Replacement total knee    . Vasectomy    . Joint replacement      R TKR  . Replacement total knee Left   . Radiology with anesthesia N/A 05/18/2014    Procedure: RADIOLOGY WITH ANESTHESIA;  Surgeon: Rob Hickman, MD;  Location: [place];  Service: Radiology;  Laterality: N/A;  . Cardioversion      There were no vitals filed for this visit.  Visit Diagnosis: Aphasia      Subjective Assessment - 12/01/14 1141    Subjective Patient reports he can typically convey his meaning, although he uses imprecise words at times.   Currently in Pain? No/denies               ADULT SLP TREATMENT - 12/01/14 0001    Treatment Provided   Treatment provided Cognitive-Linquistic   Pain Assessment   Pain Assessment No/denies pain   Cognitive-Linquistic Treatment   Treatment focused on Aphasia   Skilled Treatment VERBAL  EXPRESSION: Read phrase aloud, maintaining phonemic accuracy, with 80% accuracy. Identify item that doesn't belong in group of 5 with 90% accuracy and state reason with 90% accuracy.  Generate cogent and grammatical sentence given cognitive-linguistic task with 70% accuracy.  Errors due to abnormal hesitations and awkward phrasing due to word finding problems.   Assessment / Recommendations / Plan   Plan Continue with current plan of care;Goals updated   Progression Toward Goals   Progression toward goals Progressing toward goals          SLP Education - 12/01/14 1141    Education provided Yes   Education Details Strategies to improve indepedent word retrieval   Person(s) Educated Patient   Methods Explanation   Comprehension Returned demonstration            SLP Long Term Goals - 11/23/14 1406    SLP LONG TERM GOAL #1   Title Patient will generate grammatical and cogent sentence given cognitive-linguistic task with 80% accuracy and min SLP cues.    Time 4   Period Weeks   Status Partially Met   SLP LONG TERM GOAL #2   Title Patient will complete high level word finding tasks with 80% accuracy and min SLP cues.  Time 4   Period Weeks   Status Partially Met   SLP LONG TERM GOAL #3   Title Patient will read sentences and multi-syllabic words aloud, maintaining phonemic accuracy, with 80% accuracy   Time 4   Period Weeks   Status Achieved   SLP LONG TERM GOAL #4   Title Patient will complete auditory discrimination tasks with 80% accuracy.    Time 4   Period Weeks   Status Achieved          Plan - 12/01/14 1143    Clinical Impression Statement The patient continues to improve in communicative effectiveness with continued reduced fluency as well as errors in phonology, semantics, and syntax.  He is progressing towards goals and is on track for discharge at the end of the current certification period (12/08/2014   Speech Therapy Frequency 2x / week   Duration 4 weeks    Treatment/Interventions Language facilitation;Compensatory strategies;SLP instruction and feedback   Potential to Achieve Goals Good   Potential Considerations Ability to learn/carryover information;Cooperation/participation level;Previous level of function;Family/community support   SLP Home Exercise Plan Patient given suggestions for home practice   Consulted and Agree with Plan of Care Patient        Problem List Patient Active Problem List   Diagnosis Date Noted  . Amyloidosis   . Type 2 diabetes mellitus with other circulatory complications 78/67/6720  . HLD (hyperlipidemia) 07/02/2014  . Stroke   . Atrial fibrillation 05/18/2014  . CVA (cerebral vascular accident) 05/18/2014  . Essential hypertension 05/18/2014  . Diabetes mellitus 05/18/2014  . Acute ischemic stroke   . low-voltage ECG 03/05/2013  . Hypersomnolence 03/05/2013  . Morbid obesity 03/05/2013  . Obesity (BMI 30-39.9) 03/05/2013  . Dyspnea on exertion 11/01/2011  . Edema 11/01/2011  . Ventricular tachycardia 12/01/2010  . Abnormal  signal averaged electrocardiogram 12/01/2010   Leroy Sea, MS/CCC- SLP  Lou Miner 12/01/2014, 11:44 AM  [place] MAIN [facility] SERVICES [address] [place], [place], [address] Phone: [phone]   Fax:  [phone]

## 2014-12-02 ENCOUNTER — Ambulatory Visit: Payer: Medicare Other | Admitting: Speech Pathology

## 2014-12-02 ENCOUNTER — Encounter: Payer: Self-pay | Admitting: Speech Pathology

## 2014-12-02 DIAGNOSIS — R4701 Aphasia: Secondary | ICD-10-CM

## 2014-12-02 DIAGNOSIS — I69320 Aphasia following cerebral infarction: Secondary | ICD-10-CM | POA: Diagnosis not present

## 2014-12-02 NOTE — Therapy (Signed)
[place] MAIN [facility] SERVICES [address] [place], [place], [address] Phone: [phone]   Fax:  [phone]  Speech Language Pathology Discharge Summary  Patient Details  Name: Austin Warren MRN: [id number] Date of Birth: 1941/04/08 Referring Provider:  Kirk Ruths, MD  Encounter Date: 12/02/2014      End of Session - 12/02/14 1157    Visit Number 42   Number of Visits 42   Date for SLP Re-Evaluation 12/02/14   SLP Start Time 1100   SLP Stop Time  1145   SLP Time Calculation (min) 45 min   Activity Tolerance Patient tolerated treatment well      Past Medical History  Diagnosis Date  . Hypertension   . Hyperlipidemia   . History of colon polyps   . Benign prostatic hypertrophy   . Arthritis   . Ventricular tachycardia     RBB/LAHB ideopathic VT, 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  . Melanoma     under arm  . Osteoarthritis   . Dyspnea on exertion 11/01/2011  . Stroke 05/2014  . UTI (urinary tract infection)   . Diabetes mellitus     type 2    Past Surgical History  Procedure Laterality Date  . Replacement total knee    . Vasectomy    . Joint replacement      R TKR  . Replacement total knee Left   . Radiology with anesthesia N/A 05/18/2014    Procedure: RADIOLOGY WITH ANESTHESIA;  Surgeon: Rob Hickman, MD;  Location: [place];  Service: Radiology;  Laterality: N/A;  . Cardioversion      There were no vitals filed for this visit.  Visit Diagnosis: Aphasia      Subjective Assessment - 12/02/14 1156    Subjective Patient reports he can typically convey his meaning, although he uses imprecise words at times.   Currently in Pain? No/denies           ADULT SLP TREATMENT - 12/02/14 1154    Treatment Provided   Treatment provided Cognitive-Linquistic   Pain Assessment   Pain Assessment No/denies pain   Cognitive-Linquistic Treatment   Treatment focused on Aphasia   Skilled Treatment VERBAL  EXPRESSION: Read sentences aloud, maintaining phonemic accuracy, with 80% accuracy. Identify item that doesn't belong in group of 5 with 90% accuracy and state reason with 90% accuracy.  Generate cogent and grammatical sentence given cognitive-linguistic task with 80% accuracy.  Errors due to abnormal hesitations and awkward phrasing due to word finding problems.  AUDITORY COMPREHENSION: Complete multiple unit processing task with 90% accuracy without the need of repetition of task instruction or significant delays in responding.   Assessment / Recommendations / Plan   Plan Discharge SLP treatment due to (comment);All goals met   Progression Toward Goals   Progression toward goals Goals met, education completed, patient discharged from SLP          SLP Education - 12/02/14 1156    Education Details Strategies to improve independent word retrieval   Person(s) Educated Patient   Methods Explanation   Comprehension Returned demonstration            SLP Long Term Goals - 12/02/14 1202    SLP LONG TERM GOAL #1   Title Patient will generate grammatical and cogent sentence given cognitive-linguistic task with 80% accuracy and min SLP cues.    Status Achieved   SLP LONG TERM GOAL #2   Title Patient will complete high level  word finding tasks with 80% accuracy and min SLP cues.    Status Achieved   SLP LONG TERM GOAL #3   Title Patient will read sentences and multi-syllabic words aloud, maintaining phonemic accuracy, with 80% accuracy   Status Achieved   SLP LONG TERM GOAL #4   Title Patient will complete auditory discrimination tasks with 80% accuracy.    Status Achieved          Plan - 12/31/14 1157    Clinical Impression Statement The patient has met his goals and demonstrates significantly improved communicative effectiveness.  He demonstrates reduced fluency as well as errors in phonology, semantics, and syntax, but demonstrates effective use of circumlocution, gestures, and use  of less specific wording to effectively convey his thoughts.     Speech Therapy Frequency Other (comment)  Discharge for Speech Therapy   Duration Other (comment)  Discharge today   Treatment/Interventions Language facilitation;Compensatory strategies;SLP instruction and feedback   Potential to Achieve Goals Good   Potential Considerations Ability to learn/carryover information;Cooperation/participation level;Previous level of function;Family/community support   SLP Home Exercise Plan Patient given suggestions for home practice and education regarding word finding strategies   Consulted and Agree with Plan of Care Patient          G-Codes - 12-31-2014 1216    Functional Assessment Tool Used Verbal expression therpeutic activities   Functional Limitations Spoken language expressive   Spoken Language Expression Current Status [phone]) At least 20 percent but less than 40 percent impaired, limited or restricted   Spoken Language Expression Goal Status ([id number]) At least 1 percent but less than 20 percent impaired, limited or restricted   Spoken Language Expression Discharge Status [phone]) At least 20 percent but less than 40 percent impaired, limited or restricted      Problem List Patient Active Problem List   Diagnosis Date Noted  . Amyloidosis   . Type 2 diabetes mellitus with other circulatory complications 44/62/8638  . HLD (hyperlipidemia) 07/02/2014  . Stroke   . Atrial fibrillation 05/18/2014  . CVA (cerebral vascular accident) 05/18/2014  . Essential hypertension 05/18/2014  . Diabetes mellitus 05/18/2014  . Acute ischemic stroke   . low-voltage ECG 03/05/2013  . Hypersomnolence 03/05/2013  . Morbid obesity 03/05/2013  . Obesity (BMI 30-39.9) 03/05/2013  . Dyspnea on exertion 11/01/2011  . Edema 11/01/2011  . Ventricular tachycardia 12/01/2010  . Abnormal  signal averaged electrocardiogram 12/01/2010      Plan: Patient agrees to discharge.  Patient goals were met.  Patient is being discharged due to meeting the stated rehab goals.  ?????    Leroy Sea, MS/CCC- SLP   Lou Miner 12-31-14, 12:17 PM  [place] MAIN [facility] SERVICES [address] [place], [place], [address] Phone: [phone]   Fax:  [phone]

## 2014-12-06 ENCOUNTER — Ambulatory Visit: Payer: Medicare Other | Admitting: Speech Pathology

## 2014-12-09 ENCOUNTER — Ambulatory Visit: Payer: Medicare Other | Admitting: Speech Pathology

## 2015-01-04 ENCOUNTER — Other Ambulatory Visit: Payer: Medicare Other

## 2015-01-04 DIAGNOSIS — N390 Urinary tract infection, site not specified: Secondary | ICD-10-CM

## 2015-01-04 LAB — URINALYSIS, COMPLETE
Bilirubin, UA: NEGATIVE
Glucose, UA: NEGATIVE
Ketones, UA: NEGATIVE
Nitrite, UA: NEGATIVE
PH UA: 6 (ref 5.0–7.5)
PROTEIN UA: NEGATIVE
Specific Gravity, UA: 1.01 (ref 1.005–1.030)
UUROB: 0.2 mg/dL (ref 0.2–1.0)

## 2015-01-04 LAB — MICROSCOPIC EXAMINATION: WBC, UA: >30 /hpf — AB (ref 0–?)

## 2015-01-05 ENCOUNTER — Telehealth: Payer: Self-pay

## 2015-01-05 NOTE — Telephone Encounter (Signed)
What I can tell from [employer], pt needed a 3wk u/a for micro hematuria.

## 2015-01-05 NOTE — Telephone Encounter (Signed)
-----   Message from Nori Riis, PA-C sent at 01/04/2015  5:59 PM EDT ----- Why did this patient drop off urine?

## 2015-01-06 LAB — CULTURE, URINE COMPREHENSIVE

## 2015-01-07 NOTE — Telephone Encounter (Signed)
-----   Message from Nori Riis, PA-C sent at 01/06/2015  4:16 PM EDT ----- Was this a cath specimen?

## 2015-01-07 NOTE — Telephone Encounter (Signed)
Would you ask the patient to come in for a cath specimen?  Patient had a very bad bug and I don't want to have to do IV antibiotics if it's a skin contaminate.

## 2015-01-07 NOTE — Telephone Encounter (Signed)
No

## 2015-01-07 NOTE — Telephone Encounter (Signed)
Not able to leave message

## 2015-01-10 ENCOUNTER — Encounter (INDEPENDENT_AMBULATORY_CARE_PROVIDER_SITE_OTHER): Payer: Self-pay

## 2015-01-10 ENCOUNTER — Ambulatory Visit (INDEPENDENT_AMBULATORY_CARE_PROVIDER_SITE_OTHER): Payer: Medicare Other

## 2015-01-10 DIAGNOSIS — N390 Urinary tract infection, site not specified: Secondary | ICD-10-CM | POA: Diagnosis not present

## 2015-01-10 NOTE — Telephone Encounter (Signed)
Spoke with pt wife. Pt will be here today at 2:00. Cw,lpn

## 2015-01-10 NOTE — Progress Notes (Signed)
In and Out Catheterization  Patient is present today for a I & O catheterization due to UTI. Patient was cleaned and prepped in a sterile fashion with betadine and Lidocaine 2% jelly was instilled into the urethra.  A 14FR cath was inserted no complications were noted , 767ml of urine return was noted, urine was yellow in color and cloudy. A clean urine sample was collected for culture. Bladder was drained  And catheter was removed with out difficulty.    Preformed by: Toniann Fail, LPN  Follow up/ Additional notes: Pt voiced understanding of getting a call with results.

## 2015-01-10 NOTE — Telephone Encounter (Signed)
LMOM on wife cell. Not able to leave message on other number.

## 2015-01-12 LAB — CULTURE, URINE COMPREHENSIVE

## 2015-01-14 ENCOUNTER — Telehealth: Payer: Self-pay

## 2015-01-14 ENCOUNTER — Telehealth: Payer: Self-pay | Admitting: *Deleted

## 2015-01-14 ENCOUNTER — Other Ambulatory Visit: Payer: Self-pay | Admitting: Urology

## 2015-01-14 DIAGNOSIS — N390 Urinary tract infection, site not specified: Secondary | ICD-10-CM

## 2015-01-14 MED ORDER — FOSFOMYCIN TROMETHAMINE 3 G PO PACK: 3.0000 g | PACK | Freq: Once | ORAL | Status: DC

## 2015-01-14 NOTE — Telephone Encounter (Signed)
Austin Warren called to let you know that the medication called in is not covered by insurance required Prior auth. This med is as 1 dose ABX. Please call back if you want her to fax over the Prior auth information.  [phone]

## 2015-01-14 NOTE — Telephone Encounter (Signed)
Please advise 

## 2015-01-14 NOTE — Telephone Encounter (Signed)
Spoke with pt wife in reference to medication. Wife voiced understanding. Pt is coming in Wednesday at 10:30am for UA. Orders have been placed. Spoke with Kennyth Lose at [facility] in reference to medication. Kennyth Lose stated they do not have medication in stock. Kennyth Lose is going to call other pharmacies to see if they have medication. Pharmacy will call pt when medication is ready or call [facility] if not available. Cw,lpn

## 2015-01-14 NOTE — Telephone Encounter (Signed)
-----   Message from Nori Riis, PA-C sent at 01/13/2015 10:19 PM EDT ----- Please call fosomycin 3 grams one dose.  Then we need to recheck an UA next week.

## 2015-01-14 NOTE — Telephone Encounter (Signed)
I started the PA and it had to go to clinical review.

## 2015-01-18 NOTE — Telephone Encounter (Signed)
Spoke with pt wife who stated yes they got medication and will be coming in tomorrow for another cath specimen. Cw,lpn

## 2015-01-18 NOTE — Telephone Encounter (Signed)
Was patient able to get the Monuril?

## 2015-01-19 ENCOUNTER — Other Ambulatory Visit: Payer: Self-pay

## 2015-01-19 ENCOUNTER — Ambulatory Visit (INDEPENDENT_AMBULATORY_CARE_PROVIDER_SITE_OTHER): Payer: Medicare Other

## 2015-01-19 DIAGNOSIS — N390 Urinary tract infection, site not specified: Secondary | ICD-10-CM

## 2015-01-19 MED ORDER — LIDOCAINE HCL 2 % EX GEL
1.0000 "application " | Freq: Once | CUTANEOUS | Status: AC
  Administered 2015-01-19: 1 via URETHRAL

## 2015-01-19 NOTE — Addendum Note (Signed)
Addended by: Lestine Box on: 01/19/2015 04:49 PM   Modules accepted: Orders

## 2015-01-19 NOTE — Progress Notes (Signed)
In and Out Catheterization  Patient is present today for a I & O catheterization due to recurrent UTI. Patient was cleaned and prepped in a sterile fashion with betadine and Lidocaine 2% jelly was instilled into the urethra.  A 14FR cath was inserted no complications were noted , 467ml of urine return was noted, urine was yellow in color. A clean urine sample was collected for u/a and culture. Bladder was drained  And catheter was removed with out difficulty.    Preformed by: Toniann Fail, LPN

## 2015-01-20 ENCOUNTER — Telehealth: Payer: Self-pay

## 2015-01-20 ENCOUNTER — Other Ambulatory Visit: Payer: Self-pay | Admitting: Urology

## 2015-01-20 DIAGNOSIS — R3129 Other microscopic hematuria: Secondary | ICD-10-CM

## 2015-01-20 LAB — MICROSCOPIC EXAMINATION: Bacteria, UA: NONE SEEN

## 2015-01-20 LAB — URINALYSIS, COMPLETE
BILIRUBIN UA: NEGATIVE
Glucose, UA: NEGATIVE
KETONES UA: NEGATIVE
Nitrite, UA: NEGATIVE
PROTEIN UA: NEGATIVE
Specific Gravity, UA: 1.01 (ref 1.005–1.030)
UUROB: 0.2 mg/dL (ref 0.2–1.0)
pH, UA: 6 (ref 5.0–7.5)

## 2015-01-20 NOTE — Telephone Encounter (Signed)
Spoke with pt wife in reference to hematuria and needing a CT. Wife stated pt is no longer taking metformin and is not allergic to shellfish or iodine contrast. Wife voiced understanding of risk with contrast material. Cw,lpn

## 2015-01-20 NOTE — Telephone Encounter (Signed)
-----   Message from Nori Riis, PA-C sent at 01/20/2015  8:22 AM EDT ----- Patient continues to have micro heme.  He needs a hematuria work up.  Please schedule a CT Urogram and  explain to the patient that they will be scheduled for a CT Urogram with contrast material and that in rare instances, an allergic reaction can be serious and even life threatening with the injection of contrast material.   Make sure the patient does not have  any allergies to contrast, iodine and/or seafood and is not taking metformin.  Order for the CT is in.

## 2015-01-22 LAB — CULTURE, URINE COMPREHENSIVE

## 2015-01-24 ENCOUNTER — Telehealth: Payer: Self-pay

## 2015-01-24 NOTE — Telephone Encounter (Signed)
Around July 1 pt took a one time dose of fosomycin. Pt is not currently taking an abt. Does pt need to start vancomycin? Please advise. Cw,lpn

## 2015-01-24 NOTE — Telephone Encounter (Signed)
-----   Message from Nori Riis, PA-C sent at 01/21/2015 11:54 PM EDT ----- Patient's UCx is still preliminary as of 01/21/2015, please check Monday for the final and make sure he gets on the appropriate antibiotics.  I believe he is being scheduled for a CT Urogram.

## 2015-01-24 NOTE — Telephone Encounter (Signed)
Please call ID office (Dr. Ola Spurr) to help arrange for PICC and IV abx.    Hollice Espy, MD

## 2015-01-26 NOTE — Telephone Encounter (Signed)
Please refer to [place] thanks

## 2015-01-28 ENCOUNTER — Telehealth: Payer: Self-pay

## 2015-01-28 ENCOUNTER — Other Ambulatory Visit: Payer: Self-pay | Admitting: Family Medicine

## 2015-01-28 DIAGNOSIS — N390 Urinary tract infection, site not specified: Secondary | ICD-10-CM

## 2015-01-28 NOTE — Telephone Encounter (Signed)
Pt wife called stating her and pt did not want to get PICC line put in due to them going on vacation 02/03/15. Pt is wanting to take an oral abt if possible atleast until they got back on 02/09/15. Please advise. Referral to Dr. Ola Spurr has been made.

## 2015-01-28 NOTE — Telephone Encounter (Signed)
Nurse spoke with Dr. Blane Ohara nurse in reference to appt for pt. Dr. Ola Spurr nurse noted they would not be able to see pt until the end of August. Nurse made Dr. Erlene Quan aware. Spoke with pt wife in reference to ID referral. Made wife aware that Dr. Erlene Quan stated since she has never seen pt she would prefer [facility] handle the situation. Wife stated at this time pt is not symptomatic except at night he has frequency. Due to pt being nonsymptomatic and her lack of full knowledge, Dr. Erlene Quan would like for [facility] to make a decision Monday. If pt can wait until after vacation for PICC or if an oral medication can be given again. Pt wife was in agreeable with this plan and voiced understanding. cw,lpn

## 2015-01-30 NOTE — Telephone Encounter (Signed)
There is no oral antibiotic for which the organism is sensitive to.  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 Do we have his CT urogram scheduled?

## 2015-01-31 ENCOUNTER — Ambulatory Visit
Admission: RE | Admit: 2015-01-31 | Discharge: 2015-01-31 | Disposition: A | Discharge status: Home / Self Care | Payer: Medicare Other | Source: Ambulatory Visit | Attending: Urology | Admitting: Urology

## 2015-01-31 DIAGNOSIS — R312 Other microscopic hematuria: Secondary | ICD-10-CM | POA: Diagnosis present

## 2015-01-31 DIAGNOSIS — R3129 Other microscopic hematuria: Secondary | ICD-10-CM

## 2015-01-31 MED ORDER — IOHEXOL 300 MG/ML  SOLN
125.0000 mL | Freq: Once | INTRAMUSCULAR | Status: AC | PRN
  Administered 2015-01-31: 125 mL via INTRAVENOUS

## 2015-01-31 NOTE — Telephone Encounter (Signed)
Yes, pt is going to have CT urogram today.

## 2015-02-08 ENCOUNTER — Ambulatory Visit: Payer: Medicare Other | Admitting: Urology

## 2015-02-10 ENCOUNTER — Encounter: Payer: Self-pay | Admitting: Urology

## 2015-02-10 ENCOUNTER — Ambulatory Visit (INDEPENDENT_AMBULATORY_CARE_PROVIDER_SITE_OTHER): Payer: Medicare Other | Admitting: Urology

## 2015-02-10 VITALS — BP 101/61 | HR 59 | Ht 66.0 in | Wt 169.8 lb

## 2015-02-10 DIAGNOSIS — Q6102 Congenital multiple renal cysts: Secondary | ICD-10-CM

## 2015-02-10 DIAGNOSIS — R312 Other microscopic hematuria: Secondary | ICD-10-CM

## 2015-02-10 DIAGNOSIS — R934 Abnormal findings on diagnostic imaging of urinary organs: Secondary | ICD-10-CM | POA: Diagnosis not present

## 2015-02-10 DIAGNOSIS — R9341 Abnormal radiologic findings on diagnostic imaging of renal pelvis, ureter, or bladder: Secondary | ICD-10-CM

## 2015-02-10 DIAGNOSIS — R3129 Other microscopic hematuria: Secondary | ICD-10-CM

## 2015-02-10 DIAGNOSIS — N281 Cyst of kidney, acquired: Secondary | ICD-10-CM

## 2015-02-10 LAB — URINALYSIS, COMPLETE
BILIRUBIN UA: NEGATIVE
Glucose, UA: NEGATIVE
KETONES UA: NEGATIVE
Nitrite, UA: NEGATIVE
SPEC GRAV UA: 1.02 (ref 1.005–1.030)
UUROB: 0.2 mg/dL (ref 0.2–1.0)
pH, UA: 5.5 (ref 5.0–7.5)

## 2015-02-10 LAB — MICROSCOPIC EXAMINATION: RBC, UA: NONE SEEN /hpf (ref 0–?)

## 2015-02-12 LAB — CULTURE, URINE COMPREHENSIVE

## 2015-02-14 ENCOUNTER — Encounter: Payer: Self-pay | Admitting: Urology

## 2015-02-14 DIAGNOSIS — R9341 Abnormal radiologic findings on diagnostic imaging of renal pelvis, ureter, or bladder: Secondary | ICD-10-CM | POA: Insufficient documentation

## 2015-02-14 DIAGNOSIS — R3129 Other microscopic hematuria: Secondary | ICD-10-CM | POA: Insufficient documentation

## 2015-02-14 DIAGNOSIS — N281 Cyst of kidney, acquired: Secondary | ICD-10-CM | POA: Insufficient documentation

## 2015-02-14 NOTE — Progress Notes (Signed)
02/10/2015 7:06 PM   Austin Warren January 21, 1941 [id number]  Referring provider: Kirk Ruths, MD [place], [place] [address]  Chief Complaint  Patient presents with  . Results    CT Scan  . Urinary Frequency    Cloudy and having frequency    HPI: [name] is a 74 year old white male who was found to have incidental microscopic hematuria and underwent a CT Urogram to begin his work up for hematuria.  Patient is experiencing urinary frequency and has had  3 +UCx for enterococcus species that was pan resistant.  His last culture on the 28th of July grew out viridans streptococcus.    He has not had any fevers, chills, nausea or vomiting.    Patient underwent CT Urogram on 01/31/2015 and the findings were circumferential bladder wall thickening with an intraluminal filling defect seen along the anterior bladder wall on the delayed prone sequence, bilateral hypo enhancing renal lesions.and a markedly enlarged prostate gland.  I have reviewed the films with Dr. Erlene Quan, the patient and his wife.  Patient's previous PSA's:    5.5 ng/mL on 10/00/2015-bx was benign     1.3 ng/mL on 00/00/2013    1.4 ng/mL on 11/26/2012    1.6 ng/mL on 11/26/2013   PMH: Past Medical History  Diagnosis Date  . Hypertension   . Hyperlipidemia   . History of colon polyps   . Benign prostatic hypertrophy   . Arthritis   . Ventricular tachycardia     RBB/LAHB ideopathic VT, 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  . Osteoarthritis   . Dyspnea on exertion 11/01/2011  . Stroke 05/2014  . UTI (urinary tract infection)   . Diabetes mellitus     type 2  . Melanoma     under arm    Surgical History: Past Surgical History  Procedure Laterality Date  . Replacement total knee    . Vasectomy    . Joint replacement      R TKR  . Replacement total knee Left   . Radiology with anesthesia N/A 05/18/2014    Procedure: RADIOLOGY WITH ANESTHESIA;  Surgeon: Rob Hickman, MD;   Location: [facility];  Service: Radiology;  Laterality: N/A;  . Cardioversion      Home Medications:    Medication List       This list is accurate as of: 02/10/15 11:59 PM.  Always use your most recent med list.               apixaban 5 MG Tabs tablet  Commonly known as:  ELIQUIS  Take 1 tablet (5 mg total) by mouth 2 (two) times daily.     atorvastatin 20 MG tablet  Commonly known as:  LIPITOR  Take 1 tablet (20 mg total) by mouth daily at 6 PM.     carvedilol 3.125 MG tablet  Commonly known as:  COREG  Take 1 tablet (3.125 mg total) by mouth 2 (two) times daily.     finasteride 5 MG tablet  Commonly known as:  PROSCAR  Take 5 mg by mouth daily.     fosfomycin 3 G Pack  Commonly known as:  MONUROL  Take 3 g by mouth once.     hydrocortisone 2.5 % rectal cream  Commonly known as:  ANUSOL-HC  Place rectally 4 (four) times daily as needed for hemorrhoids or itching.     lisinopril 2.5 MG tablet  Commonly known as:  PRINIVIL,ZESTRIL  Take 1  tablet (2.5 mg total) by mouth daily.     metFORMIN 500 MG tablet  Commonly known as:  GLUCOPHAGE  Take 500 mg by mouth 2 (two) times daily with a meal.     multivitamin with minerals Tabs tablet  Take 1 tablet by mouth daily. One A Day 50+     tamsulosin 0.4 MG Caps capsule  Commonly known as:  FLOMAX  Take 0.8 mg by mouth 2 (two) times daily.     torsemide 10 MG tablet  Commonly known as:  DEMADEX  Take 10 mg by mouth daily.     verapamil 120 MG CR tablet  Commonly known as:  CALAN-SR  Take 1 tablet (120 mg total) by mouth at bedtime.     vitamin C 500 MG tablet  Commonly known as:  ASCORBIC ACID  Take 500 mg by mouth daily.     Vitamin D3 1000 UNITS Caps  Take 1,000 Units by mouth daily.        Allergies:  Allergies  Allergen Reactions  . Ciprofloxacin Anaphylaxis  . Levofloxacin Anaphylaxis    "throat closes"  . Enalapril Maleate Swelling    Angioedema  . Metoprolol Swelling    edema  . Sulfa  Antibiotics Rash    Family History: Family History  Problem Relation Age of Onset  . Stroke Father   . Heart attack Neg Hx   . Hypertension Father     Social History:  reports that he quit smoking about 45 years ago. His smoking use included Cigarettes. He started smoking about 55 years ago. He has a 20 pack-year smoking history. He has never used smokeless tobacco. He reports that he drinks alcohol. He reports that he does not use illicit drugs.  ROS: UROLOGY Frequent Urination?: Yes Hard to postpone urination?: No Burning/pain with urination?: No Get up at night to urinate?: Yes Leakage of urine?: No Urine stream starts and stops?: No Trouble starting stream?: No Do you have to strain to urinate?: No Blood in urine?: No Urinary tract infection?: No Sexually transmitted disease?: No Injury to kidneys or bladder?: No Painful intercourse?: No Weak stream?: No Erection problems?: No Penile pain?: No  Gastrointestinal Nausea?: No Vomiting?: No Indigestion/heartburn?: No Diarrhea?: No Constipation?: No  Constitutional Fever: No Night sweats?: No Weight loss?: No Fatigue?: No  Skin Skin rash/lesions?: No Itching?: No  Eyes Blurred vision?: No Double vision?: No  Ears/Nose/Throat Sore throat?: No Sinus problems?: No  Hematologic/Lymphatic Swollen glands?: No Easy bruising?: No  Cardiovascular Leg swelling?: No Chest pain?: No  Respiratory Cough?: No Shortness of breath?: No  Endocrine Excessive thirst?: No  Musculoskeletal Back pain?: No Joint pain?: No  Neurological Headaches?: No Dizziness?: No  Psychologic Depression?: No Anxiety?: No  Physical Exam: BP 101/61 mmHg  Pulse 59  Ht 5\' 6"  (1.676 m)  Wt 169 lb 12.8 oz (77.021 kg)  BMI 27.42 kg/m2  Constitutional:  Alert and oriented, No acute distress. HEENT: [place] AT, moist mucus membranes.  Trachea midline, no masses. Cardiovascular: No clubbing, cyanosis, or edema. Respiratory:  Normal respiratory effort, no increased work of breathing. GI: Abdomen is soft, nontender, nondistended, no abdominal masses GU: No CVA tenderness.  Skin: No rashes, bruises or suspicious lesions. Lymph: No cervical or inguinal adenopathy. Neurologic: Grossly intact, no focal deficits, moving all 4 extremities. Psychiatric: Normal mood and affect.  Laboratory Data: Results for orders placed or performed in visit on 02/10/15  CULTURE, URINE COMPREHENSIVE  Result Value Ref Range   Urine Culture,  Comprehensive Final report    Result 1 Comment   Microscopic Examination  Result Value Ref Range   WBC, UA >30W 0 -  5 /hpf   RBC, UA None seen 0 -  2 /hpf   Epithelial Cells (non renal) 0-10 0 - 10 /hpf   Renal Epithel, UA 0-10 (A) None seen /hpf   Bacteria, UA Moderate (A) None seen/Few  Urinalysis, Complete  Result Value Ref Range   Specific Gravity, UA 1.020 1.005 - 1.030   pH, UA 5.5 5.0 - 7.5   Color, UA Yellow Yellow   Appearance Ur Cloudy (A) Clear   Leukocytes, UA 3+ (A) Negative   Protein, UA Trace (A) Negative/Trace   Glucose, UA Negative Negative   Ketones, UA Negative Negative   RBC, UA 1+ (A) Negative   Bilirubin, UA Negative Negative   Urobilinogen, Ur 0.2 0.2 - 1.0 mg/dL   Nitrite, UA Negative Negative   Microscopic Examination See below:    Lab Results  Component Value Date   WBC 4.8 10/21/2014   HGB 14.8 10/21/2014   HCT 42.5 10/21/2014   MCV 90.6 10/21/2014   PLT 151 10/21/2014    Lab Results  Component Value Date   CREATININE 0.67 10/20/2014    No results found for: PSA  No results found for: TESTOSTERONE  Lab Results  Component Value Date   HGBA1C 6.3* 05/19/2014    Urinalysis    Component Value Date/Time   COLORURINE Amber 05/27/2014 0807   COLORURINE AMBER* 05/22/2014 1908   APPEARANCEUR Cloudy 05/27/2014 0807   APPEARANCEUR CLOUDY* 05/22/2014 1908   LABSPEC 1.027 05/27/2014 0807   LABSPEC 1.020 05/22/2014 1908   PHURINE 5.0 05/27/2014  0807   PHURINE 5.5 05/22/2014 1908   GLUCOSEU Negative 02/10/2015 1549   GLUCOSEU Negative 05/27/2014 0807   HGBUR 3+ 05/27/2014 0807   HGBUR LARGE* 05/22/2014 1908   BILIRUBINUR Negative 02/10/2015 1549   BILIRUBINUR Negative 05/27/2014 0807   BILIRUBINUR NEGATIVE 05/22/2014 1908   KETONESUR Trace 05/27/2014 0807   KETONESUR 15* 05/22/2014 1908   PROTEINUR 100 mg/dL 05/27/2014 0807   PROTEINUR 30* 05/22/2014 1908   UROBILINOGEN 1.0 05/22/2014 1908   NITRITE Negative 02/10/2015 1549   NITRITE Negative 05/27/2014 0807   NITRITE NEGATIVE 05/22/2014 1908   LEUKOCYTESUR 3+* 02/10/2015 1549   LEUKOCYTESUR 2+ 05/27/2014 0807   LEUKOCYTESUR SMALL* 05/22/2014 1908    Pertinent Imaging: CLINICAL DATA: Initial encounter for frequent urination at night worsening over the last 2-3 months. Microscopic hematuria.  EXAM: CT ABDOMEN AND PELVIS WITHOUT AND WITH CONTRAST  TECHNIQUE: Multidetector CT imaging of the abdomen and pelvis was performed following the standard protocol before and following the bolus administration of intravenous contrast.  CONTRAST: 164mL OMNIPAQUE IOHEXOL 300 MG/ML SOLN  COMPARISON: CT from 12/28/2008.  FINDINGS: Lower chest: Calcified granuloma seen in the left lower lobe.  Hepatobiliary: No focal abnormality within the liver parenchyma. There is no evidence for gallstones, gallbladder wall thickening, or pericholecystic fluid. No intrahepatic or extrahepatic biliary dilation.  Pancreas: Punctate calcification in the head of the pancreas may be related to prior inflammation. There is no dilatation of the main pancreatic duct. No focal mass lesion.  Spleen: No splenomegaly. No focal mass lesion.  Adrenals/Urinary Tract: No adrenal nodule or mass. Scattered tiny hypodensities are seen in both kidneys, measuring up to 17 mm in the interpolar left kidney. This water density lesion measured 15 mm on the study from 6 years ago. 12 mm  low-density lesion in  the interpolar right kidney has attenuation slightly higher than would be expected for a simple cyst. Delayed imaging shows no abnormality of either intrarenal collecting system or renal pelvis. No focal dilatation of either ureter. No intraluminal filling defect or focal wall thickening in either ureter.  The course of the left ureter is more anterior than typically seen and the UVJ is not visualized on this study. Patient may be status post previous ureteral reimplantation.  Pre contrast imaging shows circumferential bladder wall thickening. There is a small left-sided bladder diverticulum. Intraluminal filling defect is seen along the anterior wall the bladder on the delayed prone images (see image 62 series 12).  Stomach/Bowel: Stomach is nondistended. No gastric wall thickening. No evidence of outlet obstruction. Duodenum is normally positioned as is the ligament of Treitz. No small bowel wall thickening. No small bowel dilatation. Terminal ileum is normal. Appendix is normal. Diverticular changes are noted in the left colon without evidence of diverticulitis.  Vascular/Lymphatic: There is abdominal aortic atherosclerosis without aneurysm. No gastrohepatic or hepatoduodenal ligament lymphadenopathy. 5.0 x 4.2 cm lesion with internal dystrophic calcification is seen in the retroperitoneal tissues, just posterior to the common iliac vein confluence/inferior IVC. This lesion is stable in size comparing back to 06/30/2007.  Reproductive: Prostate gland is markedly enlarged and heterogeneous, generating mass effect on the bladder base. Seminal vesicles are not well seen.  Other: No intraperitoneal free fluid.  Musculoskeletal: Bone windows reveal no worrisome lytic or sclerotic osseous lesions.  IMPRESSION: Circumferential bladder wall thickening with an intraluminal filling defect seen along the anterior bladder wall on the delayed  prone sequence. This has low attenuation and may represent clot within the bladder lumen. Urothelial lesion is considered less likely given the density, but not excluded.  Bilateral hypo enhancing renal lesions. These are likely cysts, but some have attenuation slightly higher than would be expected for simple cyst. Given the history of hematuria, MRI without with contrast may prove helpful to ensure that none of these lesions enhance.  Stable retroperitoneal soft tissue mass posterior to the common iliac vein confluence compared to a study from almost 8 years ago. Although imaging features are indeterminate, the long stability is compatible with a benign etiology.   Electronically Signed  By: Misty Stanley M.D.  On: 01/31/2015 16:07   Assessment & Plan:    1. Microscopic hematuria:   Patient has completed his CT Urogram.  Findings are significant for circumferential bladder wall thickening with an intraluminal filling defect seen along the anterior bladder wall on the delayed prone sequence, bilateral hypo enhancing renal lesions.and a markedly enlarged prostate gland.    2. Bladder filling defect on CT Urogram:   Patient will be scheduled for a cystoscopy in the office for further evaluation of this filling defect and for planning on further treatment if it is warranted.  I have explained to the patient that they will  be scheduled for a cystoscopy in our office to evaluate their  lower urinary tract.  The cystoscopy consists of passing a tube with a lens up through their urethra and into their urinary bladder.   We will inject the urethra with a lidocaine gel prior to introducing the cystoscope to help with any discomfort during the procedure.   After the procedure, they might experience blood in the urine and discomfort with urination.  This will abate after the first few voids.  I have  encouraged the patient to increase water intake  during this time.  Patient denies any  allergies to lidocaine.   - CULTURE, URINE COMPREHENSIVE - Urinalysis, Complete  3. Bilateral renal cysts:   Patient will undergo a RUS for further evaluation of the lesions.  He does not wish to pursue a MRI at this time due to his age and claustrophobia.    - US Renal; Future   Return cystoscopy.  Zara Council, [facility] [address], [place], [place] [address] [phone]

## 2015-02-16 ENCOUNTER — Ambulatory Visit
Admission: RE | Admit: 2015-02-16 | Discharge: 2015-02-16 | Disposition: A | Discharge status: Home / Self Care | Payer: Medicare Other | Source: Ambulatory Visit | Attending: Urology | Admitting: Urology

## 2015-02-16 DIAGNOSIS — N400 Enlarged prostate without lower urinary tract symptoms: Secondary | ICD-10-CM | POA: Diagnosis not present

## 2015-02-16 DIAGNOSIS — N281 Cyst of kidney, acquired: Secondary | ICD-10-CM | POA: Diagnosis not present

## 2015-02-16 DIAGNOSIS — Q6102 Congenital multiple renal cysts: Secondary | ICD-10-CM | POA: Diagnosis present

## 2015-02-17 ENCOUNTER — Telehealth: Payer: Self-pay

## 2015-02-17 DIAGNOSIS — N289 Disorder of kidney and ureter, unspecified: Secondary | ICD-10-CM

## 2015-02-17 NOTE — Telephone Encounter (Signed)
Could you please arrange.

## 2015-02-17 NOTE — Telephone Encounter (Signed)
-----   Message from Nori Riis, PA-C sent at 02/16/2015  4:14 PM EDT ----- Patient's RUS indicates that his area of concern in his left kidney has not enlarged since 2010.   He is scheduled for a cystoscopy with one of the [facility] physicians.  Radiology is recommending a MR in 6 months.

## 2015-02-17 NOTE — Telephone Encounter (Signed)
The MR is not needed at this time.  It should be performed in 6 months.  It would be with/without.

## 2015-02-17 NOTE — Telephone Encounter (Signed)
Spoke with pt wife in reference to RUS, cysto, and MR. Wife voiced understanding. Do you want a MR with/without contrast. Please advise.

## 2015-03-03 NOTE — Telephone Encounter (Signed)
Order has been placed.

## 2015-03-03 NOTE — Telephone Encounter (Signed)
An order just needs to be put in the work queue for this and it will get done If this has already  Been done please disregard message  Thanks, Sharyn Lull

## 2015-03-07 ENCOUNTER — Ambulatory Visit (INDEPENDENT_AMBULATORY_CARE_PROVIDER_SITE_OTHER): Payer: Medicare Other | Admitting: Urology

## 2015-03-07 VITALS — BP 122/76 | HR 57 | Ht 66.0 in | Wt 167.2 lb

## 2015-03-07 DIAGNOSIS — R312 Other microscopic hematuria: Secondary | ICD-10-CM

## 2015-03-07 DIAGNOSIS — R3129 Other microscopic hematuria: Secondary | ICD-10-CM

## 2015-03-07 DIAGNOSIS — M1990 Unspecified osteoarthritis, unspecified site: Secondary | ICD-10-CM | POA: Insufficient documentation

## 2015-03-07 LAB — URINALYSIS, COMPLETE
Bilirubin, UA: NEGATIVE
Glucose, UA: NEGATIVE
Ketones, UA: NEGATIVE
Nitrite, UA: NEGATIVE
PROTEIN UA: NEGATIVE
Specific Gravity, UA: 1.01 (ref 1.005–1.030)
Urobilinogen, Ur: 0.2 mg/dL (ref 0.2–1.0)
pH, UA: 5.5 (ref 5.0–7.5)

## 2015-03-07 LAB — MICROSCOPIC EXAMINATION
Bacteria, UA: NONE SEEN
RBC, UA: NONE SEEN /hpf (ref 0–?)
WBC, UA: >30 /hpf — ABNORMAL HIGH (ref 0–?)

## 2015-03-07 MED ORDER — NITROFURANTOIN MACROCRYSTAL 100 MG PO CAPS: 100.0000 mg | ORAL_CAPSULE | Freq: Every day | ORAL | Status: DC

## 2015-03-07 MED ORDER — LIDOCAINE HCL 2 % EX GEL
1.0000 "application " | Freq: Once | CUTANEOUS | Status: AC
  Administered 2015-03-07: 1 via URETHRAL

## 2015-03-07 MED ORDER — GENTAMICIN SULFATE 40 MG/ML IJ SOLN
80.0000 mg | Freq: Once | INTRAMUSCULAR | Status: AC
  Administered 2015-03-07: 80 mg via INTRAMUSCULAR

## 2015-03-07 NOTE — Progress Notes (Signed)
Patient returns and follow today for several urologic issues.   #1 microscopic hematuria - he underwent CT urogram/hematuria protocol which revealed circumferential bladder wall thickening and intraluminal filling defect along the anterior bladder wall on the delayed prone sequence. He presents today for cystoscopy. Microscopic hematuria was incidental.   #2 urinary tract infection-Patient is experiencing nocturia and has had 3 +UCx for enterococcus species that was pan resistant.He took fosfomycin at one point. He was continued on surveillance. His last culture on the 28th of July grew out viridans streptococcus.   #3 elevated PSA-  Patient's previous PSA's:  5.5 ng/mL on 10/00/2015-bx was benign   1.3 ng/mL on 00/00/2013  1.4 ng/mL on 11/26/2012  1.6 ng/mL on 11/26/2013 - patient has been on tamsulosin and finasteride.  #4 BPH with lower urinary tract symptoms-patient is on tamsulosin and finasteride. Prostate was enlarged on July 2016 CT.  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 Renal MRI follow-up is recommended. Given minimal if any enlargement since 2010 CT, MRI could safely be delayed 6 to 12 months. No solid or complex cystic lesions seen in the right kidney.  Today, patient is seen for the above. His been well without dysuria or gross hematuria. I reviewed the CT scan and the renal ultrasound.  Cystoscopy-the flexible cystoscope was inserted. The urethra appeared normal. The prostatic urethra showed elongated prostatic urethra with lateral lobe hypertrophy and visual occlusion. Prostatic urethra 5-6 cm in length. The visualization of the bladder was obscured by cloudy urine. The urine was evacuated with a syringe for a post void of the 150 mL. The urine was sent for cytology. Reinspection revealed continued cloudy urine but diffuse erythema and edema of the entire bladder. Was not  able to visualize the trigone or ureteral orifices. There were no obvious tumor.     A/P -  BPH with lower urinary tract symptoms-continue combination therapy  Chronic cystitis-UA clear today, but bladder visualization obscured by cloudy urine and entire bladder wall erythematous, edematous similar to the CT appearance. Cytology was sent. Going to start him on nightly nitrofurantoin and plan repeat cystoscopy in about 6 weeks.  Renal cyst-the cyst especially the lower pole cyst with a septation was clearly seen on the ultrasound. Recommend repeat renal ultrasound in 1 year and if any changes scheduling.  Microscopic hematuria-evaluation seems to lead toward a diagnosis of chronic cystitis. If urine does not clear and visualization remains poor he may need cystoscopy with bladder biopsy and no were possible TURP. PVR was reasonable today.

## 2015-03-10 DIAGNOSIS — Z0000 Encounter for general adult medical examination without abnormal findings: Secondary | ICD-10-CM | POA: Insufficient documentation

## 2015-03-14 ENCOUNTER — Telehealth: Payer: Self-pay | Admitting: Obstetrics and Gynecology

## 2015-03-14 NOTE — Telephone Encounter (Signed)
Please notify patient that his urine cytology was negative for cancer cells. He needs to keep his follow-up appointment as scheduled for repeat cystoscopy. Thank you  See scanned documents for complete report.

## 2015-03-15 ENCOUNTER — Ambulatory Visit (INDEPENDENT_AMBULATORY_CARE_PROVIDER_SITE_OTHER): Payer: Medicare Other | Admitting: Internal Medicine

## 2015-03-15 ENCOUNTER — Encounter: Payer: Self-pay | Admitting: Internal Medicine

## 2015-03-15 VITALS — BP 112/60 | HR 50 | Ht 66.0 in | Wt 167.2 lb

## 2015-03-15 DIAGNOSIS — R9431 Abnormal electrocardiogram [ECG] [EKG]: Secondary | ICD-10-CM

## 2015-03-15 NOTE — Patient Instructions (Signed)
Medication Instructions:  Your physician recommends that you continue on your current medications as directed. Please refer to the Current Medication list given to you today.   Labwork: none  Testing/Procedures: none  Follow-Up: Your physician wants you to follow-up in: one year with Dr. Caryl Comes.  You will receive a reminder letter in the mail two months in advance. If you don't receive a letter, please call our office to schedule the follow-up appointment.   Any Other Special Instructions Will Be Listed Below (If Applicable).

## 2015-03-15 NOTE — Progress Notes (Signed)
Patient Care Team: Kirk Ruths, MD as PCP - General (Unknown Physician Specialty)   HPI  Austin Warren is a 74 y.o. male Seen in followup for a ventricular tachycardia with a right bundle superior axis relatively narrow QRS complex and positive concordance suggesting a septal origin and possible verapamil sensitivity. Catheterization had demonstrated normal coronary arteries and normal left ventricular function. ECG was notable for marked reduction in voltage   Signal average Electrocardiogram was markedly abnormal.  MRI 4/16>>There is nosignificant change from the prior study with persistent basal inferior and inferolateral late gadolinium enhancement slightly more pronounced on the current study and a small focal late gadolinium enhancement at the point of inferior RV attachment to the LV wall.  He also underwent fat pad biopsy>> neg  Echocardiogram 11/15 demonstrated left ventricular hypertrophy with an ejection fraction of 45-50% with hypokinesis of the inferior apical myocardium   We initially tried him on a beta blocker prescription. He had recurrent ventricular tachycardia. He was put on verapamil.  He has had no significant recurrences of ventricular tachycardia  He has had intercurrent stroke having been hospitalized 11/15. He was found to be in atrial fibrillation. He was treated with TPA. Apixaban was initiated. He is tolerating this without evidence of bleeding.  He underwent cardioversion from 10/22/2014.   He has had no recurrent atrial fibrillation of which he is aware.  Blood  pressure medications were recently reduced.  The patient denies chest pain, shortness of breath, nocturnal dyspnea, orthopnea or peripheral edema.  There have been no palpitations, lightheadedness or syncope.      Past Medical History  Diagnosis Date  . Hypertension   . Hyperlipidemia   . History of colon polyps   . Benign prostatic hypertrophy   . Arthritis   . Ventricular  tachycardia     RBB/LAHB ideopathic VT, 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  . Osteoarthritis   . Dyspnea on exertion 11/01/2011  . Stroke 05/2014  . UTI (urinary tract infection)   . Diabetes mellitus     type 2  . Melanoma     under arm    Past Surgical History  Procedure Laterality Date  . Replacement total knee    . Vasectomy    . Joint replacement      R TKR  . Replacement total knee Left   . Radiology with anesthesia N/A 05/18/2014    Procedure: RADIOLOGY WITH ANESTHESIA;  Surgeon: Rob Hickman, MD;  Location: [facility];  Service: Radiology;  Laterality: N/A;  . Cardioversion      Current Outpatient Prescriptions  Medication Sig Dispense Refill  . apixaban (ELIQUIS) 5 MG TABS tablet Take 1 tablet (5 mg total) by mouth 2 (two) times daily. 60 tablet 2  . Ascorbic Acid (VITAMIN C) 500 MG tablet Take 500 mg by mouth daily.      Marland Kitchen atorvastatin (LIPITOR) 10 MG tablet     . carvedilol (COREG) 3.125 MG tablet Take 1 tablet (3.125 mg total) by mouth 2 (two) times daily. 60 tablet 6  . Cholecalciferol (VITAMIN D3) 1000 UNITS CAPS Take 1,000 Units by mouth daily.     . finasteride (PROSCAR) 5 MG tablet Take 5 mg by mouth daily.      . Multiple Vitamin (MULTIVITAMIN WITH MINERALS) TABS tablet Take 1 tablet by mouth daily. One A Day 50+    . nitrofurantoin (MACRODANTIN) 100 MG capsule Take 1 capsule (100 mg total) by mouth at bedtime. 28 capsule 1  . Tamsulosin  HCl (FLOMAX) 0.4 MG CAPS Take 0.8 mg by mouth 2 (two) times daily.     Marland Kitchen torsemide (DEMADEX) 10 MG tablet Take 10 mg by mouth daily.     . verapamil (CALAN-SR) 120 MG CR tablet Take 1 tablet (120 mg total) by mouth at bedtime. 30 tablet 6   No current facility-administered medications for this visit.    Allergies  Allergen Reactions  . Ciprofloxacin Anaphylaxis  . Levofloxacin Anaphylaxis    "throat closes"  . Enalapril Maleate Swelling    Angioedema  . Metoprolol Swelling    edema  . Sulfa Antibiotics Rash    Review of  Systems negative except from HPI and PMH  Physical Exam BP 112/60 mmHg  Pulse 50  Ht 5\' 6"  (1.676 m)  Wt 167 lb 4 oz (75.864 kg)  BMI 27.01 kg/m2 Well developed and morbidly obese in no distress HENT normal E scleral and icterus clear Neck Supple JVP-Can't see Clear to ausculation  Regular rate and rhythm, no murmurs gallops or rub Soft with active bowel sounds No clubbing cyanosis Trace Edema Alert and oriented, grossly normal motor and sensory function Skin Warm and mildly diaphoretic  Sinus rhythm at 59  23/10/41 Low-voltage limb leads    Assessment and  Plan  Ventricular tachycardia  Low-voltage ECG  Obesity  Hypotension   Paroxysmal atrial fibrillation  We have no explanation for the low voltage abnormal signal average an abnormal MRI. It may well represent amyloid but at this point further invasive diagnostic testing particularly given the stable MRI imaging doesn't seem warranted. We will arrange an SPEP and UPEP  No intercurrent ventricular tachycardia; continue verapamil

## 2015-03-16 ENCOUNTER — Other Ambulatory Visit: Payer: Self-pay | Admitting: Urology

## 2015-03-16 NOTE — Telephone Encounter (Signed)
Spoke with pt wife in reference to cytology results. Pt wife voiced understanding.

## 2015-03-23 ENCOUNTER — Other Ambulatory Visit: Payer: Self-pay | Admitting: Urology

## 2015-03-24 ENCOUNTER — Ambulatory Visit: Payer: Medicare Other | Admitting: Neurology

## 2015-04-04 ENCOUNTER — Other Ambulatory Visit: Payer: Self-pay | Admitting: *Deleted

## 2015-04-04 ENCOUNTER — Encounter: Payer: Self-pay | Admitting: Neurology

## 2015-04-04 ENCOUNTER — Ambulatory Visit (INDEPENDENT_AMBULATORY_CARE_PROVIDER_SITE_OTHER): Payer: Medicare Other | Admitting: Neurology

## 2015-04-04 VITALS — BP 98/60 | HR 64 | Ht 66.0 in | Wt 168.2 lb

## 2015-04-04 DIAGNOSIS — E785 Hyperlipidemia, unspecified: Secondary | ICD-10-CM

## 2015-04-04 DIAGNOSIS — I639 Cerebral infarction, unspecified: Secondary | ICD-10-CM

## 2015-04-04 DIAGNOSIS — I10 Essential (primary) hypertension: Secondary | ICD-10-CM | POA: Diagnosis not present

## 2015-04-04 DIAGNOSIS — E1159 Type 2 diabetes mellitus with other circulatory complications: Secondary | ICD-10-CM | POA: Diagnosis not present

## 2015-04-04 DIAGNOSIS — I480 Paroxysmal atrial fibrillation: Secondary | ICD-10-CM

## 2015-04-04 MED ORDER — VERAPAMIL HCL ER 120 MG PO TBCR: 120.0000 mg | EXTENDED_RELEASE_TABLET | Freq: Every day | ORAL | Status: DC

## 2015-04-04 NOTE — Patient Instructions (Addendum)
-   continue eliquis and lipitor for stroke prevention - continue to follow up with Dr. Caryl Comes - Follow up with your primary care physician for stroke risk factor modification. Recommend maintain blood pressure goal <130/80, diabetes with hemoglobin A1c goal below 6.5% and lipids with LDL cholesterol goal below 70 mg/dL.  - check BP at home and adequate fluid intake. - follow up as needed.

## 2015-04-04 NOTE — Progress Notes (Signed)
STROKE NEUROLOGY FOLLOW UP NOTE  NAME: Austin Warren DOB: [birth year]  REASON FOR VISIT: stroke follow up HISTORY FROM: wife and chart  Today we had the pleasure of seeing Austin Warren in follow-up at our Neurology Clinic. Pt was accompanied by wife.   History Summary [name] is a 74 y.o. male with PMH of DM, HTN, HLD, VT, BPH was admitted on 05/18/14 for left gaze preference, right facial droop, right sided plegia. Initial CT head showed no acute infarct but did demonstrate a large dense left MCA sign. IR was contacted. IV tPA was started and patient was brought back to IR for cerebral angiogram. NIH stroke score was 23. EKG showed atrial fibrillation, which had not previously been known. Patient was administered IV TPA followed by cerebral angio in IR where he was found to have a T occlusion with complete revascularization of Lt ICA terminus, Lt MCA and Lt ACA with 12mg  of IA TPA, 1 pass with Trevoprovue, and 1 pass with Solitaire withTICI 3 revascularization. MRI showed acute infarction involving the medial LEFT temporal lobe uncus and LEFT basal ganglia. After stabilization, he was put on eliquis 5mg  bid. Cardiology was consulted for management of new onset afib and he was put on BB and ACEI and continued verapamil for VT. Not able to tolerant foley removal so he was put back on foley catheter, and discharged to CIR.  07/02/14 follow up - he was readmitted on 05/27/14 due to confusion and high grade fever. Found to have urosepsis, put on empiric coverage to complete a 10 day course - narrowed to rocephin w/ transition to ceftin at time of d/c. Foley finally removed on 05/29/14. He was sent back to rehab and he stayed for another 10 days and discharged home. He went to see Dr. Estanislado Pandy on 06/14/14 and planning to repeat MRA in some time. He also followed up with Dr. Caryl Comes in cardiology and continued on eliquis. He is tolerating eliquis well without side effects. His BP  today 125/71.  10/05/14 follow up - he was doing well. He had cardioversion last month and he felt his heart back to sinus rhythm almost all the time. he followed with Dr. 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 His Corag and verapamil dose decreased due to lethargy and hypotension. Since then, he has been feeling good. His BP 124/76 today in clinic.  Interval History During the interval time, he is doing well. No complains. He follows with Dr. Caryl Comes in cardiology and continued on eliquis. His right hand dexterity also improved. BP on the low side today 98/60, but no dizziness. His BP at home around 110-115.  REVIEW OF SYSTEMS: Full 14 system review of systems performed and notable only for those listed below and in HPI above, all others are negative:  Constitutional: N/A  Cardiovascular: N/A  Ear/Nose/Throat: N/A  Skin: N/A  Eyes: N/A  Respiratory: N/A  Gastroitestinal: N/A  Genitourinary: N/A Hematology/Lymphatic: N/A  Endocrine: N/A  Musculoskeletal: N/A  Allergy/Immunology: N/A  Neurological: N/A  Psychiatric: N/A  The following represents the patient's updated allergies and side effects list: Allergies  Allergen Reactions  . Ciprofloxacin Anaphylaxis  . Levofloxacin Anaphylaxis    "throat closes"  . Enalapril Maleate Swelling    Angioedema  . Metoprolol Swelling    edema  . Vasotec  [Enalaprilat]     Other reaction(s): Other (See Comments) Angioedema  . Sulfa Antibiotics  Rash    The neurologically relevant items on the patient's problem list were reviewed on today's visit.  Neurologic Examination  A problem focused neurological exam (12 or more points of the single system neurologic examination, vital signs counts as 1 point, cranial nerves count for 8 points) was performed.  Blood pressure 98/60, pulse 64, height 5\' 6"  (1.676 m), weight 168 lb 3.2 oz (76.295 kg).  General - Well nourished, well  developed, in no apparent distress.  Ophthalmologic - not able to see through.  Cardiovascular - Regular rate and rhythm with no murmur.  Mental Status -  Level of arousal and orientation to time, place, and person were intact. Language was assessed and found to be intact in expression, comprehension, naming and repetition.  Cranial Nerves II - XII - II - Visual field intact OU. III, IV, VI - Extraocular movements intact. V - Facial sensation intact bilaterally. VII - Facial movement intact bilaterally. VIII - Hearing & vestibular intact bilaterally. X - Palate elevates symmetrically. XI - Chin turning & shoulder shrug intact bilaterally. XII - Tongue protrusion intact.  Motor Strength - The patient's strength was normal in all extremities except mildly decreased right hand dexterity and right LE 5-/5 and pronator drift was absent.  Bulk was normal and fasciculations were absent.   Motor Tone - Muscle tone was assessed at the neck and appendages and was normal.  Reflexes - The patient's reflexes were normal in all extremities and he had no pathological reflexes.  Sensory - Light touch, temperature/pinprick were assessed and was symmetrical.    Coordination - The patient had normal movements in the hands with no ataxia or dysmetria.  Tremor was absent.  Gait and Station - Mild right hemiparetic gait.  Data reviewed: I personally reviewed the images and agree with the radiology interpretations.  Ct Head Wo Contrast 05/18/2014  Suggestion of minimal hypodensity within the left basal ganglia region possibly representing age indeterminate ischemic change. Overall however, there is relative preservation of the gray-white differentiation within the left MCA territory without suggestion of a large infarct visible at this time. Increased density along the falx and tentorium is favored to represent sequelae of contrast administration.  05/18/2014  Atrophy and minimal chronic  microvascular ischemia. No acute abnormality. Dense left MCA compatible with thrombosis.   Cerebral Angio  05/18/2014  S/P bilateral common carotid arteriograms , followed by complete revascularization of T occlusion of Lt ICA terminus ,Lt MCA and Lt ACA with 12mg  of superselective IA TPA , and xi pass with Trevoprovue 4 x 30 mm ,and x 1 pass with Solitaire FR 4 mm x 40 mm with TICI 3 revascularization  MRI head Areas of acute infarction remain following revascularization of the LEFT ICA, ACA, and MCA, involving the medial LEFT temporal lobe uncus and LEFT basal ganglia. No hemorrhagic transformation. No other areas of convexity cortical infarction are observed. Atrophy and small vessel disease, similar to priors.  2D echo - Left ventricle: The cavity size was mildly dilated. Wall thickness was increased in a pattern of moderate LVH. There was mild concentric hypertrophy. Systolic function was mildly reduced. The estimated ejection fraction was in the range of 45% to 50%. There is hypokinesis of the inferior and apical myocardium. Doppler parameters are consistent with high ventricular filling pressure. - Mitral valve: Calcified annulus. Mildly thickened leaflets. There was mild regurgitation. - Left atrium: The atrium was mildly dilated. - Right ventricle: The cavity size was at the upper limits ofnormal. Wall thickness  was mildly increased. - Right atrium: The atrium was mildly dilated. - Pulmonary arteries: Systolic pressure was mildly to moderatelyincreased. PA peak pressure: 47 mm Hg (S).  TCD 07/22/14 - diffuse athero, no evidence to suggest large vessel occlusion.  Component     Latest Ref Rng 05/19/2014 06/22/2014  Cholesterol     0 - 200 mg/dL 124   Triglycerides     <150 mg/dL 142   HDL     >39 mg/dL 38 (L)   Total CHOL/HDL Ratio      3.3   VLDL     0 - 40 mg/dL 28   LDL (calc)     0 - 99 mg/dL 58   Hgb A1c MFr Bld     <5.7 % 6.3 (H)   Mean Plasma Glucose      <117 mg/dL 134 (H)     Assessment: As you may recall, he is a 74 y.o. Caucasian male with PMH of DM, HTN, HLD, VT, BPH was admitted on 05/18/14 for left MCA CVA treated with IV tPA and mechanical revascularization. He was found to have afib fibrillation during admission and was later put on eliquis. Recovered reasonably well. We will continue Eliquis and Lipitor. He follows with Dr. Caryl Comes.   Plan:  - continue eliquis and lipitor for stroke prevention - continue to follow up with Dr. Caryl Comes - Follow up with your primary care physician for stroke risk factor modification. Recommend maintain blood pressure goal <130/80, diabetes with hemoglobin A1c goal below 6.5% and lipids with LDL cholesterol goal below 70 mg/dL.  - check BP at home and adequate fluid intake. - RTC PRN  No orders of the defined types were placed in this encounter.    No orders of the defined types were placed in this encounter.    Patient Instructions  - continue eliquis and lipitor for stroke prevention - continue to follow up with Dr. Caryl Comes - Follow up with your primary care physician for stroke risk factor modification. Recommend maintain blood pressure goal <130/80, diabetes with hemoglobin A1c goal below 6.5% and lipids with LDL cholesterol goal below 70 mg/dL.  - check BP at home and adequate fluid intake. - follow up as needed.     Rosalin Hawking, MD PhD [facility] [address] [place], [place] [address] [phone]

## 2015-04-05 ENCOUNTER — Other Ambulatory Visit: Payer: Self-pay | Admitting: *Deleted

## 2015-04-05 MED ORDER — APIXABAN 5 MG PO TABS: 5.0000 mg | ORAL_TABLET | Freq: Two times a day (BID) | ORAL | Status: DC

## 2015-04-05 MED ORDER — CARVEDILOL 3.125 MG PO TABS: 3.1250 mg | ORAL_TABLET | Freq: Two times a day (BID) | ORAL | Status: DC

## 2015-04-26 ENCOUNTER — Other Ambulatory Visit: Payer: Medicare Other

## 2015-05-03 ENCOUNTER — Ambulatory Visit (INDEPENDENT_AMBULATORY_CARE_PROVIDER_SITE_OTHER): Payer: Medicare Other | Admitting: Urology

## 2015-05-03 ENCOUNTER — Encounter: Payer: Self-pay | Admitting: Urology

## 2015-05-03 VITALS — BP 143/80 | HR 56 | Ht 66.0 in | Wt 166.4 lb

## 2015-05-03 DIAGNOSIS — N3020 Other chronic cystitis without hematuria: Secondary | ICD-10-CM

## 2015-05-03 LAB — MICROSCOPIC EXAMINATION
Bacteria, UA: NONE SEEN
EPITHELIAL CELLS (NON RENAL): NONE SEEN /HPF (ref 0–10)
Renal Epithel, UA: NONE SEEN /hpf

## 2015-05-03 LAB — URINALYSIS, COMPLETE
BILIRUBIN UA: NEGATIVE
Glucose, UA: NEGATIVE
KETONES UA: NEGATIVE
Nitrite, UA: NEGATIVE
Protein, UA: NEGATIVE
Specific Gravity, UA: 1.01 (ref 1.005–1.030)
Urobilinogen, Ur: 0.2 mg/dL (ref 0.2–1.0)
pH, UA: 6 (ref 5.0–7.5)

## 2015-05-03 MED ORDER — LIDOCAINE HCL 2 % EX GEL
1.0000 "application " | Freq: Once | CUTANEOUS | Status: AC
  Administered 2015-05-03: 1 via URETHRAL

## 2015-05-03 MED ORDER — DOXYCYCLINE HYCLATE 100 MG PO TABS: 100.0000 mg | ORAL_TABLET | Freq: Once | ORAL | Status: DC

## 2015-05-03 MED ORDER — DOXYCYCLINE MONOHYDRATE 100 MG PO CAPS
100.0000 mg | ORAL_CAPSULE | Freq: Once | ORAL | Status: AC
  Administered 2015-05-03: 100 mg via ORAL

## 2015-05-03 MED ORDER — NITROFURANTOIN MONOHYD MACRO 100 MG PO CAPS: 100.0000 mg | ORAL_CAPSULE | Freq: Once | ORAL | Status: DC

## 2015-05-03 NOTE — Progress Notes (Signed)
05/03/2015 10:17 AM   Austin Warren 1941-04-17 [id number]  Referring provider: Kirk Ruths, MD [facility] [place], [place] [address]  Chief Complaint  Patient presents with  . Cysto    6wk    HPI:  #1 microscopic hematuria - he underwent CT urogram/hematuria protocol which revealed circumferential bladder wall thickening and intraluminal filling defect along the anterior bladder wall on the delayed prone sequence. He presents today for cystoscopy. Microscopic hematuria was incidental. Cysto w/o urothelial lesions whatsoeve,r just very large prostate.   #2 urinary tract infection-Patient is experiencing nocturia and has had 3 +UCx for enterococcus species that was pan resistant.He took fosfomycin at one point. He was continued on surveillance. His last culture on the 28th of July grew out viridans streptococcus. No h/o febrile UTI. PVR <11mL. No hydro / stones by imaging x several  #3 elevated PSA-  Patient's previous PSA's:  5.5 ng/mL on 10/00/2015-bx Benign  1.3 ng/mL on 00/00/2013  1.4 ng/mL on 11/26/2012  1.6 ng/mL on 11/26/2013 - patient has been on tamsulosin and finasteride at age 52, prostate estimated >100gm.   #4 BPH with lower urinary tract symptoms-patient is on tamsulosin and finasteride. Prostate was enlarged on July 2016 CT.  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 Renal MRI follow-up is recommended. Given minimal if any enlargement since 2010 CT, MRI could safely be delayed 6 to 12 months. No solid or complex cystic lesions seen in the right kidney.     PMH: Past Medical History  Diagnosis Date  . Hypertension   . Hyperlipidemia   . History of colon polyps   . Benign prostatic hypertrophy   . Arthritis   . Ventricular tachycardia (HCC)     RBB/LAHB ideopathic VT, 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  . Osteoarthritis    . Dyspnea on exertion 11/01/2011  . Stroke ([place]) 05/2014  . UTI (urinary tract infection)   . Melanoma ([place])     under arm    Surgical History: Past Surgical History  Procedure Laterality Date  . Replacement total knee    . Vasectomy    . Joint replacement      R TKR  . Replacement total knee Left   . Radiology with anesthesia N/A 05/18/2014    Procedure: RADIOLOGY WITH ANESTHESIA;  Surgeon: Rob Hickman, MD;  Location: [place];  Service: Radiology;  Laterality: N/A;  . Cardioversion    . Cholecystectomy      Home Medications:    Medication List       This list is accurate as of: 05/03/15 10:17 AM.  Always use your most recent med list.               apixaban 5 MG Tabs tablet  Commonly known as:  ELIQUIS  Take 1 tablet (5 mg total) by mouth 2 (two) times daily.     atorvastatin 10 MG tablet  Commonly known as:  LIPITOR     carvedilol 3.125 MG tablet  Commonly known as:  COREG  Take 1 tablet (3.125 mg total) by mouth 2 (two) times daily.     finasteride 5 MG tablet  Commonly known as:  PROSCAR  Take 5 mg by mouth daily.     multivitamin with minerals Tabs tablet  Take 1 tablet by mouth daily. One A Day 50+     tamsulosin 0.4  MG Caps capsule  Commonly known as:  FLOMAX  Take 0.8 mg by mouth 2 (two) times daily.     torsemide 10 MG tablet  Commonly known as:  DEMADEX  Take 10 mg by mouth daily.     verapamil 120 MG CR tablet  Commonly known as:  CALAN-SR  Take 1 tablet (120 mg total) by mouth at bedtime.     vitamin C 500 MG tablet  Commonly known as:  ASCORBIC ACID  Take 500 mg by mouth daily.     Vitamin D3 1000 UNITS Caps  Take 1,000 Units by mouth daily.        Allergies:  Allergies  Allergen Reactions  . Ciprofloxacin Anaphylaxis  . Levofloxacin Anaphylaxis    "throat closes"  . Enalapril Maleate Swelling    Angioedema  . Metoprolol Swelling    edema  . Vasotec  [Enalaprilat]     Other reaction(s): Other (See  Comments) Angioedema  . Sulfa Antibiotics Rash    Family History: Family History  Problem Relation Age of Onset  . Stroke Father   . Heart attack Neg Hx   . Hypertension Father     Social History:  reports that he quit smoking about 45 years ago. His smoking use included Cigarettes. He started smoking about 55 years ago. He has a 20 pack-year smoking history. He has never used smokeless tobacco. He reports that he does not drink alcohol or use illicit drugs.  ROS:     Review of Systems  Gastrointestinal (upper)  : Negative for upper GI symptoms  Gastrointestinal (lower) : Negative for lower GI symptoms  Constitutional : Negative for symptoms  Skin: Negative for skin symptoms  Eyes: Negative for eye symptoms  Ear/Nose/Throat : Negative for Ear/Nose/Throat symptoms  Hematologic/Lymphatic: Negative for Hematologic/Lymphatic symptoms  Cardiovascular : Negative for cardiovascular symptoms  Respiratory : Negative for respiratory symptoms  Endocrine: Negative for endocrine symptoms  Musculoskeletal: Negative for musculoskeletal symptoms  Neurological: Negative for neurological symptoms  Psychologic: Negative for psychiatric symptoms        Physical Exam: BP 143/80 mmHg  Pulse 56  Ht 5\' 6"  (1.676 m)  Wt 166 lb 6.4 oz (75.479 kg)  BMI 26.87 kg/m2  Constitutional:  Alert and oriented, No acute distress. HEENT: [place] AT, moist mucus membranes.  Trachea midline, no masses. Cardiovascular: No clubbing, cyanosis, or edema. Respiratory: Normal respiratory effort, no increased work of breathing. GI: Abdomen is soft, nontender, nondistended, no abdominal masses GU: No CVA tenderness. Phallus sraight.  Skin: No rashes, bruises or suspicious lesions. Lymph: No cervical or inguinal adenopathy. Neurologic: Grossly intact, no focal deficits, moving all 4 extremities. Psychiatric: Normal mood and affect.  Laboratory Data: Lab Results  Component Value Date    WBC 4.8 10/21/2014   HGB 14.8 10/21/2014   HCT 42.5 10/21/2014   MCV 90.6 10/21/2014   PLT 151 10/21/2014    Lab Results  Component Value Date   CREATININE 0.67 10/20/2014    No results found for: PSA  No results found for: TESTOSTERONE  Lab Results  Component Value Date   HGBA1C 6.3* 05/19/2014    Urinalysis    Component Value Date/Time   COLORURINE Amber 05/27/2014 0807   COLORURINE AMBER* 05/22/2014 1908   APPEARANCEUR Cloudy 05/27/2014 0807   APPEARANCEUR CLOUDY* 05/22/2014 1908   LABSPEC 1.027 05/27/2014 0807   LABSPEC 1.020 05/22/2014 1908   PHURINE 5.0 05/27/2014 0807   PHURINE 5.5 05/22/2014 1908   GLUCOSEU Negative 03/07/2015 1408  GLUCOSEU Negative 05/27/2014 0807   HGBUR 3+ 05/27/2014 0807   HGBUR LARGE* 05/22/2014 1908   BILIRUBINUR Negative 03/07/2015 1408   BILIRUBINUR Negative 05/27/2014 0807   BILIRUBINUR NEGATIVE 05/22/2014 1908   KETONESUR Trace 05/27/2014 0807   KETONESUR 15* 05/22/2014 1908   PROTEINUR 100 mg/dL 05/27/2014 0807   PROTEINUR 30* 05/22/2014 1908   UROBILINOGEN 1.0 05/22/2014 1908   NITRITE Negative 03/07/2015 1408   NITRITE Negative 05/27/2014 0807   NITRITE NEGATIVE 05/22/2014 1908   LEUKOCYTESUR 3+* 03/07/2015 1408   LEUKOCYTESUR 2+ 05/27/2014 0807   LEUKOCYTESUR SMALL* 05/22/2014 1908        Cystoscopy Procedure Note  Patient identification was confirmed, informed consent was obtained, and patient was prepped using Betadine solution.  Lidocaine jelly was administered per urethral meatus.    Preoperative abx where received prior to procedure.     Pre-Procedure: - Inspection reveals a normal caliber ureteral meatus.  Procedure: The flexible cystoscope was introduced without difficulty - No urethral strictures/lesions are present. - Enlarged prostate mostly bilobar, estimate >100gm - Normal bladder neck - Bilateral ureteral orifices identified - Bladder mucosa  reveals no ulcers, tumors, or lesions - No  bladder stones - Significant trabeculation, Left small hutch diverticula without masses.  Retroflexion shows no additional findings.    Post-Procedure: - Patient tolerated the procedure well    Assessment & Plan:    #1 microscopic hematuria - likely from large prostate lateral lobes rubbing together. Reinforced need for repeat eval for future gross episodoes only.   #2 urinary tract infection- only modifiable factor very large prostate. Fortunately these have been non-complex (no fevers / pyelo). Observe.   #3 elevated PSA- most likley BPH, consider stop screening.   #4 BPH - continue max medical therapy. Should symptoms become refractory, may need TURP v. Simple prostatectomy.   #5 Bosniak 2 left lower pole cyst- low risk lesion, I feel yearly  Or every other year surveillance with [place] is sufficient.  6 - RTC as previously scheudled aroudn 11/2015 for annual vsit.   Return if symptoms worsen or fail to improve.  Alexis Frock, [facility] [address], [place], [place] [address] [phone]

## 2015-05-03 NOTE — Addendum Note (Signed)
Addended by: Tommy Rainwater on: 05/03/2015 11:53 AM   Modules accepted: Orders

## 2015-06-21 ENCOUNTER — Other Ambulatory Visit (HOSPITAL_COMMUNITY): Payer: Self-pay | Admitting: Interventional Radiology

## 2015-06-21 DIAGNOSIS — I771 Stricture of artery: Secondary | ICD-10-CM

## 2015-06-21 DIAGNOSIS — I639 Cerebral infarction, unspecified: Secondary | ICD-10-CM

## 2015-07-15 ENCOUNTER — Ambulatory Visit (HOSPITAL_COMMUNITY): Payer: Medicare Other

## 2015-07-15 ENCOUNTER — Ambulatory Visit (HOSPITAL_COMMUNITY)
Admission: RE | Admit: 2015-07-15 | Discharge: 2015-07-15 | Disposition: A | Discharge status: Home / Self Care | Payer: Medicare Other | Source: Ambulatory Visit | Attending: Interventional Radiology | Admitting: Interventional Radiology

## 2015-07-15 DIAGNOSIS — Z8673 Personal history of transient ischemic attack (TIA), and cerebral infarction without residual deficits: Secondary | ICD-10-CM | POA: Insufficient documentation

## 2015-07-15 DIAGNOSIS — I771 Stricture of artery: Secondary | ICD-10-CM | POA: Diagnosis not present

## 2015-07-15 DIAGNOSIS — I639 Cerebral infarction, unspecified: Secondary | ICD-10-CM

## 2015-07-15 LAB — CREATININE, SERUM
CREATININE: 0.77 mg/dL (ref 0.61–1.24)
GFR calc Af Amer: >60 mL/min (ref >60)

## 2015-07-15 MED ORDER — GADOBENATE DIMEGLUMINE 529 MG/ML IV SOLN
15.0000 mL | Freq: Once | INTRAVENOUS | Status: AC
  Administered 2015-07-15: 15 mL via INTRAVENOUS

## 2015-08-02 ENCOUNTER — Telehealth (HOSPITAL_COMMUNITY): Payer: Self-pay

## 2015-08-02 NOTE — Telephone Encounter (Signed)
Informed pt's wife to have pt f/u in 1 year with a MRI per Dr. Estanislado Pandy. She agreed with this plan. AW

## 2015-08-08 ENCOUNTER — Other Ambulatory Visit: Payer: Self-pay | Admitting: *Deleted

## 2015-08-08 MED ORDER — CARVEDILOL 3.125 MG PO TABS: 3.1250 mg | ORAL_TABLET | Freq: Two times a day (BID) | ORAL | Status: DC

## 2015-08-10 ENCOUNTER — Other Ambulatory Visit: Payer: Self-pay

## 2015-08-10 MED ORDER — VERAPAMIL HCL ER 120 MG PO TBCR: 120.0000 mg | EXTENDED_RELEASE_TABLET | Freq: Every day | ORAL | Status: DC

## 2015-08-10 MED ORDER — APIXABAN 5 MG PO TABS: 5.0000 mg | ORAL_TABLET | Freq: Two times a day (BID) | ORAL | Status: DC

## 2015-08-10 MED ORDER — CARVEDILOL 3.125 MG PO TABS: 3.1250 mg | ORAL_TABLET | Freq: Two times a day (BID) | ORAL | Status: DC

## 2015-10-06 ENCOUNTER — Ambulatory Visit (INDEPENDENT_AMBULATORY_CARE_PROVIDER_SITE_OTHER): Payer: Medicare Other | Admitting: Internal Medicine

## 2015-10-06 ENCOUNTER — Encounter: Payer: Self-pay | Admitting: Internal Medicine

## 2015-10-06 VITALS — BP 110/62 | HR 58 | Ht 66.0 in | Wt 171.5 lb

## 2015-10-06 DIAGNOSIS — I639 Cerebral infarction, unspecified: Secondary | ICD-10-CM | POA: Diagnosis not present

## 2015-10-06 DIAGNOSIS — I482 Chronic atrial fibrillation, unspecified: Secondary | ICD-10-CM

## 2015-10-06 NOTE — Patient Instructions (Signed)
Medication Instructions: - Your physician recommends that you continue on your current medications as directed. Please refer to the Current Medication list given to you today.  Labwork: - none  Procedures/Testing: - none  Follow-Up: - Your physician recommends that you schedule a follow-up appointment in: 3 months with Dr. Caryl Comes.  Any Additional Special Instructions Will Be Listed Below (If Applicable).     If you need a refill on your cardiac medications before your next appointment, please call your pharmacy.

## 2015-10-06 NOTE — Progress Notes (Signed)
Patient Care Team: Kirk Ruths, MD as PCP - General (Unknown Physician Specialty)   HPI  Austin Warren is a 75 y.o. male Seen in followup for a ventricular tachycardia with a right bundle superior axis relatively narrow QRS complex and positive concordance suggesting a septal origin and possible verapamil sensitivity. Catheterization had demonstrated normal coronary arteries and normal left ventricular function. ECG was notable for marked reduction in voltage   Signal average Electrocardiogram was markedly abnormal.  MRI 4/16>>There is nosignificant change from the prior study with persistent basal inferior and inferolateral late gadolinium enhancement slightly more pronounced on the current study and a small focal late gadolinium enhancement at the point of inferior RV attachment to the LV wall.  He also underwent fat pad biopsy>> neg  Echocardiogram 11/15 demonstrated left ventricular hypertrophy with an ejection fraction of 45-50% with hypokinesis of the inferior apical myocardium   We initially tried him on a beta blocker prescription. He had recurrent ventricular tachycardia. He was put on verapamil.  He has had no significant recurrences of ventricular tachycardia  He has had intercurrent stroke having been hospitalized 11/15. He was found to be in atrial fibrillation. He was treated with TPA. Apixaban was initiated. He is tolerating this without evidence of bleeding.  He has intellectual consequences and aphasia  He underwent cardioversion from 10/22/2014.   He has had no recurrent atrial fibrillation of which he is aware.     The patient denies chest pain, shortness of breath, nocturnal dyspnea, orthopnea or peripheral edema.    He has poor exercise and energy levels. After discussion back and forth it was not clear whether was related to his foot or to energy.    Past Medical History  Diagnosis Date  . Hypertension   . Hyperlipidemia   . History of colon polyps    . Benign prostatic hypertrophy   . Arthritis   . Ventricular tachycardia (HCC)     RBB/LAHB ideopathic VT, 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  . Osteoarthritis   . Dyspnea on exertion 11/01/2011  . Stroke ([place]) 05/2014  . UTI (urinary tract infection)   . Melanoma ([place])     under arm    Past Surgical History  Procedure Laterality Date  . Replacement total knee    . Vasectomy    . Joint replacement      R TKR  . Replacement total knee Left   . Radiology with anesthesia N/A 05/18/2014    Procedure: RADIOLOGY WITH ANESTHESIA;  Surgeon: Rob Hickman, MD;  Location: [facility];  Service: Radiology;  Laterality: N/A;  . Cardioversion    . Cholecystectomy      Current Outpatient Prescriptions  Medication Sig Dispense Refill  . apixaban (ELIQUIS) 5 MG TABS tablet Take 1 tablet (5 mg total) by mouth 2 (two) times daily. 60 tablet 3  . Ascorbic Acid (VITAMIN C) 500 MG tablet Take 500 mg by mouth daily.      Marland Kitchen atorvastatin (LIPITOR) 10 MG tablet Take 10 mg by mouth.     . Cholecalciferol (VITAMIN D3) 1000 UNITS CAPS Take 1,000 Units by mouth daily.     . finasteride (PROSCAR) 5 MG tablet Take 5 mg by mouth daily.      . Multiple Vitamin (MULTIVITAMIN WITH MINERALS) TABS tablet Take 1 tablet by mouth daily. One A Day 50+    . Tamsulosin HCl (FLOMAX) 0.4 MG CAPS Take 0.8 mg by mouth 2 (two) times daily.     Marland Kitchen torsemide (  DEMADEX) 10 MG tablet Take 10 mg by mouth daily.     . verapamil (CALAN-SR) 120 MG CR tablet Take 1 tablet (120 mg total) by mouth at bedtime. 30 tablet 3   No current facility-administered medications for this visit.    Allergies  Allergen Reactions  . Ciprofloxacin Anaphylaxis  . Levofloxacin Anaphylaxis    "throat closes"  . Enalapril Maleate Swelling    Angioedema  . Metoprolol Swelling    edema  . Vasotec  [Enalaprilat]     Other reaction(s): Other (See Comments) Angioedema  . Sulfa Antibiotics Rash    Review of Systems negative except from HPI and  PMH  Physical Exam BP 110/62 mmHg  Pulse 58  Ht 5\' 6"  (1.676 m)  Wt 171 lb 8 oz (77.792 kg)  BMI 27.69 kg/m2 Well developed and morbidly obese in no distress HENT normal E scleral and icterus clear Neck Supple JVP-Can't see Clear to ausculation  Regular rate and rhythm, no murmurs gallops or rub Soft with active bowel sounds No clubbing cyanosis Trace Edema Alert and oriented, grossly normal motor and sensory function Skin Warm and mildly diaphoretic  Sinus rhythm at 59  23/10/41 Low-voltage limb leads    Assessment and  Plan  Ventricular tachycardia  Low-voltage ECG  Obesity  Hypotension   Paroxysmal atrial fibrillation  CVA    He is here for preoperative clearance for colonoscopy. He apparently precancerous lesions. He will hold his apixaban for 36 hours 3 doses  Wiese discussed some of the issues related to the intellectual consequences of his stroke and the despondency this part of his loss of function.  I worry about the bradycardia contributing to his "poor energy". He will try and help [place] understand whether his limitations are to keep versus his foot. In the event that they are the former, he will be using his granddaughters FitBit to try to ascertain maximum heart rate to give [place] some insight as to whether he may have chronotropic incompetence

## 2015-10-14 ENCOUNTER — Encounter: Payer: Self-pay | Admitting: *Deleted

## 2015-10-17 ENCOUNTER — Encounter
Admission: RE | Disposition: A | Discharge status: Home / Self Care | Payer: Self-pay | Source: Ambulatory Visit | Attending: Unknown Physician Specialty

## 2015-10-17 ENCOUNTER — Ambulatory Visit: Payer: Medicare Other | Admitting: Certified Registered Nurse Anesthetist

## 2015-10-17 ENCOUNTER — Ambulatory Visit
Admission: RE | Admit: 2015-10-17 | Discharge: 2015-10-17 | Disposition: A | Discharge status: Home / Self Care | Payer: Medicare Other | Source: Ambulatory Visit | Attending: Unknown Physician Specialty | Admitting: Unknown Physician Specialty

## 2015-10-17 ENCOUNTER — Encounter: Payer: Self-pay | Admitting: Anesthesiology

## 2015-10-17 DIAGNOSIS — Z96651 Presence of right artificial knee joint: Secondary | ICD-10-CM | POA: Insufficient documentation

## 2015-10-17 DIAGNOSIS — Z87891 Personal history of nicotine dependence: Secondary | ICD-10-CM | POA: Insufficient documentation

## 2015-10-17 DIAGNOSIS — Z7901 Long term (current) use of anticoagulants: Secondary | ICD-10-CM | POA: Insufficient documentation

## 2015-10-17 DIAGNOSIS — D123 Benign neoplasm of transverse colon: Secondary | ICD-10-CM | POA: Diagnosis not present

## 2015-10-17 DIAGNOSIS — Z79899 Other long term (current) drug therapy: Secondary | ICD-10-CM | POA: Diagnosis not present

## 2015-10-17 DIAGNOSIS — I69351 Hemiplegia and hemiparesis following cerebral infarction affecting right dominant side: Secondary | ICD-10-CM | POA: Insufficient documentation

## 2015-10-17 DIAGNOSIS — Z1211 Encounter for screening for malignant neoplasm of colon: Secondary | ICD-10-CM | POA: Insufficient documentation

## 2015-10-17 DIAGNOSIS — I69320 Aphasia following cerebral infarction: Secondary | ICD-10-CM | POA: Insufficient documentation

## 2015-10-17 DIAGNOSIS — E785 Hyperlipidemia, unspecified: Secondary | ICD-10-CM | POA: Diagnosis not present

## 2015-10-17 DIAGNOSIS — I10 Essential (primary) hypertension: Secondary | ICD-10-CM | POA: Insufficient documentation

## 2015-10-17 DIAGNOSIS — Z85820 Personal history of malignant melanoma of skin: Secondary | ICD-10-CM | POA: Diagnosis not present

## 2015-10-17 DIAGNOSIS — D122 Benign neoplasm of ascending colon: Secondary | ICD-10-CM | POA: Diagnosis not present

## 2015-10-17 DIAGNOSIS — M1990 Unspecified osteoarthritis, unspecified site: Secondary | ICD-10-CM | POA: Insufficient documentation

## 2015-10-17 DIAGNOSIS — N400 Enlarged prostate without lower urinary tract symptoms: Secondary | ICD-10-CM | POA: Insufficient documentation

## 2015-10-17 DIAGNOSIS — K640 First degree hemorrhoids: Secondary | ICD-10-CM | POA: Diagnosis not present

## 2015-10-17 DIAGNOSIS — K5730 Diverticulosis of large intestine without perforation or abscess without bleeding: Secondary | ICD-10-CM | POA: Insufficient documentation

## 2015-10-17 HISTORY — PX: COLONOSCOPY WITH PROPOFOL: SHX5780

## 2015-10-17 SURGERY — COLONOSCOPY WITH PROPOFOL
Anesthesia: General

## 2015-10-17 MED ORDER — PHENYLEPHRINE HCL 10 MG/ML IJ SOLN
INTRAMUSCULAR | Status: DC | PRN
Start: 2015-10-17 — End: 2015-10-17
  Administered 2015-10-17: 100 ug via INTRAVENOUS

## 2015-10-17 MED ORDER — SODIUM CHLORIDE 0.9 % IV SOLN: INTRAVENOUS | Status: DC

## 2015-10-17 MED ORDER — MIDAZOLAM HCL 2 MG/2ML IJ SOLN
INTRAMUSCULAR | Status: DC | PRN
  Administered 2015-10-17: 1 mg via INTRAVENOUS

## 2015-10-17 MED ORDER — VANCOMYCIN HCL IN DEXTROSE 750-5 MG/150ML-% IV SOLN
750.0000 mg | Freq: Once | INTRAVENOUS | Status: DC
  Filled 2015-10-17: qty 150

## 2015-10-17 MED ORDER — SODIUM CHLORIDE 0.9 % IV SOLN
INTRAVENOUS | Status: DC
  Administered 2015-10-17: 11:00:00 via INTRAVENOUS

## 2015-10-17 MED ORDER — LIDOCAINE HCL (CARDIAC) 20 MG/ML IV SOLN
INTRAVENOUS | Status: DC | PRN
  Administered 2015-10-17: 60 mg via INTRAVENOUS

## 2015-10-17 MED ORDER — PROPOFOL 10 MG/ML IV BOLUS
INTRAVENOUS | Status: DC | PRN
  Administered 2015-10-17: 80 mg via INTRAVENOUS
  Administered 2015-10-17: 30 mg via INTRAVENOUS
  Administered 2015-10-17: 10 mg via INTRAVENOUS

## 2015-10-17 MED ORDER — PROPOFOL 500 MG/50ML IV EMUL
INTRAVENOUS | Status: DC | PRN
  Administered 2015-10-17: 140 ug/kg/min via INTRAVENOUS

## 2015-10-17 MED ORDER — EPHEDRINE SULFATE 50 MG/ML IJ SOLN
INTRAMUSCULAR | Status: DC | PRN
  Administered 2015-10-17 (×4): 5 mg via INTRAVENOUS

## 2015-10-17 MED ORDER — GENTAMICIN IN SALINE 1.6-0.9 MG/ML-% IV SOLN
80.0000 mg | Freq: Once | INTRAVENOUS | Status: DC
  Filled 2015-10-17: qty 50

## 2015-10-17 NOTE — Op Note (Signed)
[facility] Gastroenterology Patient Name: Austin Warren Procedure Date: 10/17/2015 11:22 AM MRN: [id number] Account #: [id number] Date of Birth: Jun 18, 1941 Admit Type: Outpatient Age: 75 Room: [facility] ENDO ROOM 1 Gender: Male Note Status: Finalized Procedure:            Colonoscopy Indications:          High risk colon cancer surveillance: Personal history                        of colonic polyps Providers:            Manya Silvas, MD Referring MD:         Ocie Cornfield. Ouida Sills, MD (Referring MD) Medicines:            Propofol per Anesthesia Complications:        No immediate complications. Procedure:            Pre-Anesthesia Assessment:                       - After reviewing the risks and benefits, the patient                        was deemed in satisfactory condition to undergo the                        procedure.                       After obtaining informed consent, the colonoscope was                        passed under direct vision. Throughout the procedure,                        the patient's blood pressure, pulse, and oxygen                        saturations were monitored continuously. The                        Colonoscope was introduced through the anus and                        advanced to the the cecum, identified by appendiceal                        orifice and ileocecal valve. The colonoscopy was                        performed without difficulty. The patient tolerated the                        procedure well. The quality of the bowel preparation                        was good. Findings:      Four sessile polyps were found in the ascending colon. The polyps were       small in size. These polyps were removed with a hot snare. Resection and       retrieval were complete. To prevent bleeding after the polypectomy,  three hemostatic clips were successfully placed. One on each of 3       polyps.There was no bleeding during, or at  the end, of the procedure.      Two sessile polyps were found in the transverse colon. The polyps were       diminutive in size. These polyps were removed with a jumbo cold forceps.       Resection and retrieval were complete.      Multiple small and large-mouthed diverticula were found in the sigmoid       colon, descending colon, transverse colon and ascending colon.      Internal hemorrhoids were found during endoscopy. The hemorrhoids were       small and Grade I (internal hemorrhoids that do not prolapse). Impression:           - Four small polyps in the ascending colon, removed                        with a hot snare. Resected and retrieved. Clips were                        placed.                       - Two diminutive polyps in the transverse colon,                        removed with a jumbo cold forceps. Resected and                        retrieved.                       - Diverticulosis in the sigmoid colon, in the                        descending colon, in the transverse colon and in the                        ascending colon.                       - Internal hemorrhoids. Recommendation:       - Await pathology results. Manya Silvas, MD 10/17/2015 12:13:14 PM This report has been signed electronically. Number of Addenda: 0 Note Initiated On: 10/17/2015 11:22 AM Scope Withdrawal Time: 0 hours 12 minutes 34 seconds  Total Procedure Duration: 0 hours 33 minutes 56 seconds       [facility]

## 2015-10-17 NOTE — Anesthesia Procedure Notes (Signed)
Date/Time: 10/17/2015 11:31 AM Performed by: Johnna Acosta Pre-anesthesia Checklist: Patient identified, Emergency Drugs available, Suction available, Patient being monitored and Timeout performed Patient Re-evaluated:Patient Re-evaluated prior to inductionOxygen Delivery Method: Nasal cannula

## 2015-10-17 NOTE — Anesthesia Postprocedure Evaluation (Signed)
Anesthesia Post Note  Patient: Austin Warren  Procedure(s) Performed: Procedure(s) (LRB): COLONOSCOPY WITH PROPOFOL (N/A)  Patient location during evaluation: Endoscopy Anesthesia Type: General Level of consciousness: awake and alert Pain management: pain level controlled Vital Signs Assessment: post-procedure vital signs reviewed and stable Respiratory status: spontaneous breathing, nonlabored ventilation, respiratory function stable and patient connected to nasal cannula oxygen Cardiovascular status: blood pressure returned to baseline and stable Postop Assessment: no signs of nausea or vomiting Anesthetic complications: no    Last Vitals:  Filed Vitals:   10/17/15 1232 10/17/15 1242  BP: 111/63 122/70  Pulse: 59 65  Temp:    Resp: 10 17    Last Pain: There were no vitals filed for this visit.               Martha Clan

## 2015-10-17 NOTE — H&P (Signed)
Primary Care Physician:  Kirk Ruths., MD Primary Gastroenterologist:  Dr. Vira Agar  Pre-Procedure History & Physical: HPI:  Austin Warren is a 75 y.o. male is here for an colonoscopy.   Past Medical History  Diagnosis Date  . Hypertension   . Hyperlipidemia   . History of colon polyps   . Benign prostatic hypertrophy   . Arthritis   . Ventricular tachycardia (HCC)     RBB/LAHB ideopathic VT, 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  . Osteoarthritis   . Dyspnea on exertion 11/01/2011  . Stroke ([place]) 05/2014  . UTI (urinary tract infection)   . 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 Replacement total knee    . Vasectomy    . Joint replacement      R TKR  . Replacement total knee Left   . Radiology with anesthesia N/A 05/18/2014    Procedure: RADIOLOGY WITH ANESTHESIA;  Surgeon: Rob Hickman, MD;  Location: [place];  Service: Radiology;  Laterality: N/A;  . Cardioversion    . Cholecystectomy      Prior to Admission medications   Medication Sig Start Date End Date Taking? Authorizing Provider  apixaban (ELIQUIS) 5 MG TABS tablet Take 1 tablet (5 mg total) by mouth 2 (two) times daily. 08/10/15  Yes Deboraha Sprang, MD  finasteride (PROSCAR) 5 MG tablet Take 5 mg by mouth daily.     Yes Historical Provider, MD  Tamsulosin HCl (FLOMAX) 0.4 MG CAPS Take 0.8 mg by mouth 2 (two) times daily.    Yes Historical Provider, MD  torsemide (DEMADEX) 10 MG tablet Take 10 mg by mouth daily.  02/17/13  Yes Historical Provider, MD  Ascorbic Acid (VITAMIN C) 500 MG tablet Take 500 mg by mouth daily.      Historical Provider, MD  atorvastatin (LIPITOR) 10 MG tablet Take 10 mg by mouth.  03/07/15   Historical Provider, MD  Cholecalciferol (VITAMIN D3) 1000 UNITS CAPS Take 1,000 Units by mouth daily.     Historical Provider, MD  Multiple Vitamin (MULTIVITAMIN WITH MINERALS) TABS tablet Take 1 tablet by mouth daily. One A Day 50+    Historical  Provider, MD  verapamil (CALAN-SR) 120 MG CR tablet Take 1 tablet (120 mg total) by mouth at bedtime. 08/10/15   Deboraha Sprang, MD    Allergies as of 10/07/2015 - Review Complete 10/06/2015  Allergen Reaction Noted  . Ciprofloxacin Anaphylaxis 11/16/2010  . Levofloxacin Anaphylaxis 11/16/2010  . Enalapril maleate Swelling 06/22/2014  . Metoprolol Swelling 06/22/2014  . Vasotec  [enalaprilat]  04/04/2015  . Sulfa antibiotics Rash 05/18/2014    Family History  Problem Relation Age of Onset  . Stroke Father   . Heart attack Neg Hx   . Hypertension Father     Social History   Social History  . Marital Status: Married    Spouse Name: N/A  . Number of Children: 2  . Years of Education: BS   Occupational History  . Not on file.   Social History Main Topics  . Smoking status: Former Smoker -- 2.00 packs/day for 10 years    Types: Cigarettes    Start date: 02/10/1960    Quit date: 02/09/1970  . Smokeless tobacco: Never Used     Comment: 05/2014    quit smoking  45 years ago  . Alcohol Use: No     Comment: \  . Drug Use: No  .  Sexual Activity: Not on file   Other Topics Concern  . Not on file   Social History Narrative   Patient is married with 1 living and 1 deceased child.   Patient is right handed.   Patient has BS degree.   Patient 1 cup daily.    Review of Systems: See HPI, otherwise negative ROS  Physical Exam: BP 110/63 mmHg  Pulse 64  Temp(Src) 99 F (37.2 C) (Tympanic)  Resp 17  Ht 5\' 6"  (1.676 m)  Wt 73.483 kg (162 lb)  BMI 26.16 kg/m2  SpO2 100% General:   Alert,  pleasant and cooperative in NAD Head:  Normocephalic and atraumatic. Neck:  Supple; no masses or thyromegaly. Lungs:  Clear throughout to auscultation.    Heart:  Regular rate and rhythm. Abdomen:  Soft, nontender and nondistended. Normal bowel sounds, without guarding, and without rebound.   Neurologic:  Alert and  oriented x4;  grossly normal  neurologically.  Impression/Plan: Austin Warren is here for an colonoscopy to be performed for [facility] colon polyps  Risks, benefits, limitations, and alternatives regarding  colonoscopy have been reviewed with the patient.  Questions have been answered.  All parties agreeable.   Gaylyn Cheers, MD  10/17/2015, 11:27 AM

## 2015-10-17 NOTE — Transfer of Care (Signed)
Immediate Anesthesia Transfer of Care Note  Patient: Austin Warren  Procedure(s) Performed: Procedure(s): COLONOSCOPY WITH PROPOFOL (N/A)  Patient Location: PACU  Anesthesia Type:General  Level of Consciousness: sedated  Airway & Oxygen Therapy: Patient Spontanous Breathing and Patient connected to nasal cannula oxygen  Post-op Assessment: Report given to RN and Post -op Vital signs reviewed and stable  Post vital signs: Reviewed and stable  Last Vitals:  Filed Vitals:   10/17/15 1046 10/17/15 1213  BP: 110/63 94/54  Pulse: 64 52  Temp: 37.2 C 36.2 C  Resp: 17 12    Complications: No apparent anesthesia complications

## 2015-10-17 NOTE — Anesthesia Preprocedure Evaluation (Signed)
Anesthesia Evaluation  Patient identified by MRN, date of birth, ID band Patient awake    Reviewed: Allergy & Precautions, H&P , NPO status , Patient's Chart, lab work & pertinent test results, reviewed documented beta blocker date and time   History of Anesthesia Complications Negative for: history of anesthetic complications  Airway Mallampati: I  TM Distance: >3 FB Neck ROM: full    Dental no notable dental hx. 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), Residual Symptoms negative psych ROS   GI/Hepatic negative GI ROS, Neg liver ROS,   Endo/Other  diabetes (hasn't been an issue since 80 pound weight loss)  Renal/GU negative Renal ROS  negative genitourinary   Musculoskeletal   Abdominal   Peds  Hematology negative hematology ROS (+)   Anesthesia Other Findings Past Medical History:   Hypertension                                                 Hyperlipidemia                                               History of colon polyps                                      Benign prostatic hypertrophy                                 Arthritis                                                    Ventricular tachycardia (HCC)                                  Comment:RBB/LAHB ideopathic VT, noninducible at EPS               03/14/11   Osteoarthritis                                               Dyspnea on  exertion                             11/01/2011    Stroke ([place])  05/2014      UTI (urinary tract infection)                                Melanoma (HCC)                                                 Comment:under arm   Reproductive/Obstetrics negative OB ROS                             Anesthesia Physical Anesthesia Plan  ASA: III  Anesthesia Plan: General   Post-op Pain Management:    Induction:   Airway Management Planned:   Additional Equipment:   Intra-op Plan:   Post-operative Plan:   Informed Consent: I have reviewed the patients History and Physical, chart, labs and discussed the procedure including the risks, benefits and alternatives for the proposed anesthesia with the patient or authorized representative who has indicated his/her understanding and acceptance.   Dental Advisory Given  Plan Discussed with: Anesthesiologist, CRNA and Surgeon  Anesthesia Plan Comments:         Anesthesia Quick Evaluation

## 2015-10-18 ENCOUNTER — Encounter: Payer: Self-pay | Admitting: Unknown Physician Specialty

## 2015-10-18 LAB — SURGICAL PATHOLOGY

## 2015-11-18 ENCOUNTER — Encounter: Payer: Self-pay | Admitting: Emergency Medicine

## 2015-11-18 ENCOUNTER — Emergency Department
Admission: EM | Admit: 2015-11-18 | Discharge: 2015-11-19 | Disposition: A | Discharge status: Home / Self Care | Payer: Medicare Other | Attending: Emergency Medicine | Admitting: Emergency Medicine

## 2015-11-18 DIAGNOSIS — E669 Obesity, unspecified: Secondary | ICD-10-CM | POA: Insufficient documentation

## 2015-11-18 DIAGNOSIS — E785 Hyperlipidemia, unspecified: Secondary | ICD-10-CM | POA: Diagnosis not present

## 2015-11-18 DIAGNOSIS — Z87891 Personal history of nicotine dependence: Secondary | ICD-10-CM | POA: Insufficient documentation

## 2015-11-18 DIAGNOSIS — M1990 Unspecified osteoarthritis, unspecified site: Secondary | ICD-10-CM | POA: Diagnosis not present

## 2015-11-18 DIAGNOSIS — Z96653 Presence of artificial knee joint, bilateral: Secondary | ICD-10-CM | POA: Diagnosis not present

## 2015-11-18 DIAGNOSIS — E1159 Type 2 diabetes mellitus with other circulatory complications: Secondary | ICD-10-CM | POA: Insufficient documentation

## 2015-11-18 DIAGNOSIS — R002 Palpitations: Secondary | ICD-10-CM | POA: Diagnosis present

## 2015-11-18 DIAGNOSIS — Z85820 Personal history of malignant melanoma of skin: Secondary | ICD-10-CM | POA: Insufficient documentation

## 2015-11-18 DIAGNOSIS — Z8673 Personal history of transient ischemic attack (TIA), and cerebral infarction without residual deficits: Secondary | ICD-10-CM | POA: Diagnosis not present

## 2015-11-18 DIAGNOSIS — I472 Ventricular tachycardia, unspecified: Secondary | ICD-10-CM

## 2015-11-18 DIAGNOSIS — I10 Essential (primary) hypertension: Secondary | ICD-10-CM | POA: Insufficient documentation

## 2015-11-18 DIAGNOSIS — Z79899 Other long term (current) drug therapy: Secondary | ICD-10-CM | POA: Insufficient documentation

## 2015-11-18 DIAGNOSIS — Z86010 Personal history of colonic polyps: Secondary | ICD-10-CM | POA: Insufficient documentation

## 2015-11-18 DIAGNOSIS — I4891 Unspecified atrial fibrillation: Secondary | ICD-10-CM | POA: Diagnosis not present

## 2015-11-19 ENCOUNTER — Emergency Department: Payer: Medicare Other

## 2015-11-19 LAB — COMPREHENSIVE METABOLIC PANEL
ALBUMIN: 4.4 g/dL (ref 3.5–5.0)
ALK PHOS: 98 U/L (ref 38–126)
ALT: 29 U/L (ref 17–63)
AST: 37 U/L (ref 15–41)
Anion gap: 10 (ref 5–15)
BUN: 20 mg/dL (ref 6–20)
CALCIUM: 9.2 mg/dL (ref 8.9–10.3)
CHLORIDE: 102 mmol/L (ref 101–111)
CO2: 27 mmol/L (ref 22–32)
CREATININE: 0.82 mg/dL (ref 0.61–1.24)
GFR calc non Af Amer: >60 mL/min (ref >60)
GLUCOSE: 123 mg/dL — AB (ref 65–99)
Potassium: 3.6 mmol/L (ref 3.5–5.1)
SODIUM: 139 mmol/L (ref 135–145)
Total Bilirubin: 0.8 mg/dL (ref 0.3–1.2)
Total Protein: 7.3 g/dL (ref 6.5–8.1)

## 2015-11-19 LAB — CBC WITH DIFFERENTIAL/PLATELET
BASOS ABS: 0.1 10*3/uL (ref 0–0.1)
BASOS PCT: 1 %
EOS ABS: 0.3 10*3/uL (ref 0–0.7)
EOS PCT: 4 %
HCT: 45.8 % (ref 40.0–52.0)
HEMOGLOBIN: 15.5 g/dL (ref 13.0–18.0)
LYMPHS ABS: 3 10*3/uL (ref 1.0–3.6)
Lymphocytes Relative: 42 %
MCH: 32.2 pg (ref 26.0–34.0)
MCHC: 33.9 g/dL (ref 32.0–36.0)
MCV: 95 fL (ref 80.0–100.0)
Monocytes Absolute: 0.6 10*3/uL (ref 0.2–1.0)
Monocytes Relative: 9 %
NEUTROS PCT: 44 %
Neutro Abs: 3.2 10*3/uL (ref 1.4–6.5)
PLATELETS: 116 10*3/uL — AB (ref 150–440)
RBC: 4.82 MIL/uL (ref 4.40–5.90)
RDW: 13.5 % (ref 11.5–14.5)
WBC: 7.1 10*3/uL (ref 3.8–10.6)

## 2015-11-19 LAB — MAGNESIUM: MAGNESIUM: 2 mg/dL (ref 1.7–2.4)

## 2015-11-19 LAB — TROPONIN I: Troponin I: <0.03 ng/mL (ref <0.031)

## 2015-11-19 MED ORDER — PROMETHAZINE HCL 25 MG PO TABS
ORAL_TABLET | ORAL | Status: AC
  Filled 2015-11-19: qty 1

## 2015-11-19 MED ORDER — METOPROLOL TARTRATE 5 MG/5ML IV SOLN
5.0000 mg | Freq: Once | INTRAVENOUS | Status: AC
  Administered 2015-11-19: 5 mg via INTRAVENOUS

## 2015-11-19 MED ORDER — PROPOFOL 1000 MG/100ML IV EMUL
INTRAVENOUS | Status: AC
  Filled 2015-11-19: qty 100

## 2015-11-19 MED ORDER — METOPROLOL TARTRATE 5 MG/5ML IV SOLN
INTRAVENOUS | Status: AC
  Filled 2015-11-19: qty 5

## 2015-11-19 MED ORDER — SODIUM CHLORIDE 0.9 % IV BOLUS (SEPSIS)
1000.0000 mL | Freq: Once | INTRAVENOUS | Status: AC
  Administered 2015-11-19: 1000 mL via INTRAVENOUS

## 2015-11-19 MED ORDER — PROPOFOL 10 MG/ML IV BOLUS
0.5000 mg/kg | Freq: Once | INTRAVENOUS | Status: AC
  Administered 2015-11-19: 146 mg via INTRAVENOUS

## 2015-11-19 MED ORDER — AMIODARONE IV BOLUS ONLY 150 MG/100ML
INTRAVENOUS | Status: AC
  Filled 2015-11-19: qty 100

## 2015-11-19 NOTE — ED Provider Notes (Signed)
Time Seen: Approximately 0016  I have reviewed the triage notes  Chief Complaint: Tachycardia   History of Present Illness: Austin Warren is a 75 y.o. male who has a history of episodes of atrial fibrillation (paroxysmal atrial fibrillation and stable ventricular tachycardia. Patient has been cardioverted in the past. He felt lightheaded and felt heart palpitations tonight. He denies any chest pain or shortness of breath. Patient is on chronic Eliquis. He's had discussions and one previous attempt at cardiac ablation therapy. The patient denies any syncopal episode. He denies any fever, calf tenderness, focal weakness or any other new concerns.   Past Medical History  Diagnosis Date  . Hypertension   . Hyperlipidemia   . History of colon polyps   . Benign prostatic hypertrophy   . Arthritis   . Ventricular tachycardia (HCC)     RBB/LAHB ideopathic VT, 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  . Osteoarthritis   . Dyspnea on exertion 11/01/2011  . Stroke ([place]) 05/2014  . UTI (urinary tract infection)   . Melanoma ([place])     under arm    Patient Active Problem List   Diagnosis Date Noted  . Encounter for general adult medical examination without abnormal findings 03/10/2015  . Arthritis 03/07/2015  . Microscopic hematuria 02/14/2015  . Bilateral renal cysts 02/14/2015  . 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 Amyloidosis ([place])   . Type 2 diabetes mellitus with other circulatory complications ([place]) [id number]  . HLD (hyperlipidemia) 07/02/2014  . Ischemic stroke ([place]) 06/21/2014  . Atrial fibrillation, chronic ([place]) 06/19/2014  . 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 Stroke ([place])   . Atrial fibrillation ([place]) 05/18/2014  . CVA (cerebral vascular accident) ([place]) 05/18/2014  . Essential hypertension 05/18/2014  . Diabetes mellitus ([place]) 05/18/2014  . A-fib ([place]) 05/18/2014  . Cerebral vascular accident ([place]) 05/18/2014  . Cerebrovascular accident (CVA)  ([place]) 05/18/2014  . Acute ischemic stroke ([place])   . Benign prostatic hyperplasia with urinary obstruction 12/19/2013  . Diabetes ([place]) 12/19/2013  . Type 2 diabetes mellitus with other specified complication ([place]) [id number]  . Retroperitoneal mass 12/19/2013  . Type 2 diabetes mellitus ([place]) 12/19/2013  . low-voltage ECG 03/05/2013  . Hypersomnolence 03/05/2013  . Morbid obesity ([place]) 03/05/2013  . Obesity (BMI 30-39.9) 03/05/2013  . Difficulty staying awake 03/05/2013  . Abnormal prostate specific antigen 11/28/2012  . Elevated prostate specific antigen (PSA) 11/28/2012  . Dyspnea on exertion 11/01/2011  . Edema 11/01/2011  . Ventricular tachycardia ([place]) 12/01/2010  . Abnormal  signal averaged electrocardiogram 12/01/2010  . 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 Replacement total knee    . Vasectomy    . Joint replacement      R TKR  . Replacement total knee Left   . Radiology with anesthesia N/A 05/18/2014    Procedure: RADIOLOGY WITH ANESTHESIA;  Surgeon: Rob Hickman, MD;  Location: [facility];  Service: Radiology;  Laterality: N/A;  . Cardioversion    . Colonoscopy with propofol N/A 10/17/2015    Procedure: COLONOSCOPY WITH PROPOFOL;  Surgeon: Manya Silvas, MD;  Location: [facility] ENDOSCOPY;  Service: Endoscopy;  Laterality: N/A;    Past Surgical History  Procedure Laterality Date  . Replacement total knee    . Vasectomy    . Joint replacement      R TKR  . Replacement total knee Left   . Radiology with anesthesia N/A 05/18/2014  Procedure: RADIOLOGY WITH ANESTHESIA;  Surgeon: Rob Hickman, MD;  Location: [facility];  Service: Radiology;  Laterality: N/A;  . Cardioversion    . Colonoscopy with propofol N/A 10/17/2015    Procedure: COLONOSCOPY WITH PROPOFOL;  Surgeon: Manya Silvas, MD;  Location[facility]COPY;  Service: Endoscopy;  Laterality: N/A;    Current Outpatient Rx  Name  Route   Sig  Dispense  Refill  . apixaban (ELIQUIS) 5 MG TABS tablet   Oral   Take 1 tablet (5 mg total) by mouth 2 (two) times daily.   60 tablet   3   . Ascorbic Acid (VITAMIN C) 500 MG tablet   Oral   Take 500 mg by mouth daily.           Marland Kitchen atorvastatin (LIPITOR) 10 MG tablet   Oral   Take 10 mg by mouth.          . Cholecalciferol (VITAMIN D3) 1000 UNITS CAPS   Oral   Take 1,000 Units by mouth daily.          . finasteride (PROSCAR) 5 MG tablet   Oral   Take 5 mg by mouth daily.           . Multiple Vitamin (MULTIVITAMIN WITH MINERALS) TABS tablet   Oral   Take 1 tablet by mouth daily. One A Day 50+         . Tamsulosin HCl (FLOMAX) 0.4 MG CAPS   Oral   Take 0.8 mg by mouth daily.          Marland Kitchen torsemide (DEMADEX) 10 MG tablet   Oral   Take 10 mg by mouth daily.          . verapamil (CALAN-SR) 120 MG CR tablet   Oral   Take 1 tablet (120 mg total) by mouth at bedtime.   30 tablet   3     Allergies:  Ciprofloxacin; Levofloxacin; Enalapril maleate; Metoprolol; Vasotec; and Sulfa antibiotics  Family History: Family History  Problem Relation Age of Onset  . Stroke Father   . Heart attack Neg Hx   . Hypertension Father     Social History: Social History  Substance Use Topics  . Smoking status: Former Smoker -- 2.00 packs/day for 10 years    Types: Cigarettes    Start date: 02/10/1960    Quit date: 02/09/1970  . Smokeless tobacco: Never Used     Comment: 05/2014    quit smoking  45 years ago  . Alcohol Use: Yes     Comment: occasionally     Review of Systems:   10 point review of systems was performed and was otherwise negative:  Constitutional: No fever Eyes: No visual disturbances ENT: No sore throat, ear pain Cardiac: No chest pain Respiratory: Mild shortness of breath without wheezing or stridor Abdomen: No abdominal pain, no vomiting, No diarrhea Endocrine: No weight loss, No night sweats Extremities: No peripheral edema,  cyanosis Skin: No rashes, easy bruising Neurologic: No focal weakness, trouble with speech or swollowing Urologic: No dysuria, Hematuria, or urinary frequency   Physical Exam:  ED Triage Vitals  Enc Vitals Group     BP 11/19/15 0016 113/72 mmHg     Pulse Rate 11/19/15 0016 82     Resp 11/19/15 0016 14     Temp --      Temp src --      SpO2 11/19/15 0016 100 %     Weight --  Height --      Head Cir --      Peak Flow --      Pain Score 11/19/15 0043 0     Pain Loc --      Pain Edu? --      Excl. in [place]? --     General: Awake , Alert , and Oriented times 3; GCS 15 Head: Normal cephalic , atraumatic Eyes: Pupils equal , round, reactive to light Nose/Throat: No nasal drainage, patent upper airway without erythema or exudate.  Neck: Supple, Full range of motion, No anterior adenopathy or palpable thyroid masses Lungs: Clear to ascultation without wheezes , rhonchi, or rales Heart: Irregular rate, irregular rhythm without murmurs gallops or rubs Abdomen: Soft, non tender without rebound, guarding , or rigidity; bowel sounds positive and symmetric in all 4 quadrants. No organomegaly .        Extremities: 2 plus symmetric pulses. No edema, clubbing or cyanosis Neurologic: normal ambulation, Motor symmetric without deficits, sensory intact Skin: warm, dry, no rashes   Labs:   All laboratory work was reviewed including any pertinent negatives or positives listed below:  Labs Reviewed  CBC WITH DIFFERENTIAL/PLATELET - Abnormal; Notable for the following:    Platelets 116 (*)    All other components within normal limits  COMPREHENSIVE METABOLIC PANEL - Abnormal; Notable for the following:    Glucose, Bld 123 (*)    All other components within normal limits  TROPONIN I  MAGNESIUM  Laboratory work was reviewed and showed no clinically significant abnormalities.   EKG: #1 2351 Wide-complex tachycardia at 217 bpm RR interval 276 ms Normal axis Ventricular  tachycardia  ED ECG REPORT number to I, Daymon Larsen, the attending physician, personally viewed and interpreted this ECG.  Date: 11/19/2015 EKG Time: 0007 Rate: 94 Rhythm: normal sinus rhythm with occasional PVCs QRS Axis: normal Intervals: normal ST/T Wave abnormalities: normal Conduction Disturbances: none Narrative Interpretation: unremarkable Low-voltage QRS No acute ischemic changes with some nonspecific ST and T-wave abnormalities  Radiology:  EXAM: CHEST 2 VIEW  COMPARISON: None.  FINDINGS: The heart size is borderline. The hila and mediastinum are unremarkable. No pneumothorax. No pulmonary nodules, masses, or infiltrates.  IMPRESSION: No active cardiopulmonary disease.   Electronically Signed * I personally reviewed the radiologic studies   Procedures:  Procedure #1 Conscious sedation, Patient's wife and patient himself consented for conscious sedation for cardioversion. Patient's airway was examined and otherwise appears to be a good candidate for conscious sedation. Patient has had propofol in the past with previous cardioversion. Pre-and post-sedation at home meds were filled out on the computer. Patient received 2 mg/kg dose of IV propofol. He remained hemodynamically stable. The propofol was placed by myself. Patient was continuously monitored and had no episodes of hypoxia and otherwise tolerated his procedure well. Conscious sedation time was total 31 minutes  Procedure #2 Electrical cardioversion Again consent was obtained, alternatives discussed, and risk factors reviewed. The patient and his wife consented for electrical cardioversion for atrial fibrillation and stable ventricular tachycardia. He is currently adequately anticoagulated with Eliquis and is received cardioversion before in the past. His blood pressure remained stable. Passive placed by the nursing staff and the patient was cardioverted with 100 J synchronized cardioversion with  biphasic electricity. Patient was cardioverted to a normal sinus rhythm and was given IV Lopressor for rate suppression after the procedure.   Critical Care:  CRITICAL CARE Performed by: Daymon Larsen   Total critical care time: 15  minutes  Critical care time was exclusive of separately billable procedures and treating other patients.  Critical care was necessary to treat or prevent imminent or life-threatening deterioration.  Critical care was time spent personally by me on the following activities: development of treatment plan with patient and/or surrogate as well as nursing, discussions with consultants, evaluation of patient's response to treatment, examination of patient, obtaining history from patient or surrogate, ordering and performing treatments and interventions, ordering and review of laboratory studies, ordering and review of radiographic studies, pulse oximetry and re-evaluation of patient's condition. Evaluation and treatment for stable ventricular tachycardia and atrial fibrillation    ED Course: * Patient's stay here showed symptomatic improvement. He was placed on a continuous cardiac monitor arrives in stable wide-complex tachycardia appearance of ventricular tachycardia. The patient went in and out of ventricular tachycardia to atrial fibrillation with a rapid ventricular rate. I felt the patient could be adequately cardioverted with either arrhythmia since he was adequately anticoagulated. Patient was given conscious sedation and then was cardioverted and observed here in emergency department. His laboratory work appears to be within normal limits. He remains in a normal sinus rhythm with occasional PVCs. Vernard Gambles and his wife wish to go home at this point was discharged to follow up with her cardiologist. He should continue with all his current medications.   Assessment:  Ventricular tachycardia Atrial fibrillation   Final Clinical Impression:   Final diagnoses:   Ventricular tachycardia [facility])     Plan:  Outpatient Patient was advised to return immediately if condition worsens. Patient was advised to follow up with their primary care physician or other specialized physicians involved in their outpatient care. The patient and/or family member/power of attorney had laboratory results reviewed at the bedside. All questions and concerns were addressed and appropriate discharge instructions were distributed by the nursing staff.            Daymon Larsen, MD 11/19/15 [phone]

## 2015-11-19 NOTE — Discharge Instructions (Signed)
Electrical Cardioversion Electrical cardioversion is the delivery of a jolt of electricity to change the rhythm of the heart. Sticky patches or metal paddles are placed on the chest to deliver the electricity from a device. This is done to restore a normal rhythm. A rhythm that is too fast or not regular keeps the heart from pumping well. Electrical cardioversion is done in an emergency if:   There is low or no blood pressure as a result of the heart rhythm.   Normal rhythm must be restored as fast as possible to protect the brain and heart from further damage.   It may save a life. Cardioversion may be done for heart rhythms that are not immediately life threatening, such as atrial fibrillation or flutter, in which:   The heart is beating too fast or is not regular.   Medicine to change the rhythm has not worked.   It is safe to wait in order to allow time for preparation.  Symptoms of the abnormal rhythm are bothersome.  The risk of stroke and other serious problems can be reduced. LET [facility] CARE PROVIDER KNOW ABOUT:   Any allergies you have.  All medicines you are taking, including vitamins, herbs, eye drops, creams, and over-the-counter medicines.  Previous problems you or members of your family have had with the use of anesthetics.   Any blood disorders you have.   Previous surgeries you have had.   Medical conditions you have. RISKS AND COMPLICATIONS  Generally, this is a safe procedure. However, problems can occur and include:   Breathing problems related to the anesthetic used.  A blood clot that breaks free and travels to other parts of your body. This could cause a stroke or other problems. The risk of this is lowered by use of blood-thinning medicine (anticoagulant) prior to the procedure.  Cardiac arrest (rare). BEFORE THE PROCEDURE   You may have tests to detect blood clots in your heart and to evaluate heart function.  You may start taking  anticoagulants so your blood does not clot as easily.   Medicines may be given to help stabilize your heart rate and rhythm. PROCEDURE  You will be given medicine through an IV tube to reduce discomfort and make you sleepy (sedative).   An electrical shock will be delivered. AFTER THE PROCEDURE Your heart rhythm will be watched to make sure it does not change.    This information is not intended to replace advice given to you by your health care provider. Make sure you discuss any questions you have with your health care provider.   Document Released: 06/22/2002 Document Revised: 07/23/2014 Document Reviewed: 01/14/2013 Elsevier Interactive Patient Education [employer].   Please return immediately if condition worsens. Please contact her primary physician or the physician you were given for referral. If you have any specialist physicians involved in her treatment and plan please also contact them. Thank you for using [facility] emergency Department.

## 2015-11-19 NOTE — ED Notes (Signed)
Synchronized cardioversion performed at this time, [place] [facility] [place]. Pt tolerated well, pt noted to be in SR with HR of 84 BPM.

## 2015-11-19 NOTE — ED Notes (Signed)
Patient transported to X-ray 

## 2015-11-21 ENCOUNTER — Telehealth: Payer: Self-pay | Admitting: Internal Medicine

## 2015-11-21 NOTE — Telephone Encounter (Signed)
Pt wife calling stating pt was seen in ED on 11/18/15  Pt is coming 12/09/15 to see Ignacia Bayley

## 2015-11-21 NOTE — Telephone Encounter (Signed)
Left detailed voicemail message with appointment information, date, time, location, and instructions to call back if any further questions.

## 2015-11-21 NOTE — Telephone Encounter (Signed)
Patients wife called back in regards to voicemail about follow up from ED visit. She confirmed the follow up appointment and had no further questions at this time. Instructed her to call back if they have any questions or concerns. She verbalized understanding of our conversation.

## 2015-12-01 ENCOUNTER — Inpatient Hospital Stay (HOSPITAL_COMMUNITY)
Admission: AD | Admit: 2015-12-01 | Discharge: 2015-12-08 | DRG: 274 | Disposition: A | Discharge status: Home / Self Care | Payer: Medicare Other | Source: Other Acute Inpatient Hospital | Attending: Cardiology | Admitting: Cardiology

## 2015-12-01 ENCOUNTER — Inpatient Hospital Stay
Admission: EM | Admit: 2015-12-01 | Discharge: 2015-12-01 | DRG: 309 | Disposition: A | Discharge status: Other Acute Inpatient Hospital | Payer: Medicare Other | Attending: Internal Medicine | Admitting: Internal Medicine

## 2015-12-01 ENCOUNTER — Emergency Department: Payer: Medicare Other

## 2015-12-01 DIAGNOSIS — I480 Paroxysmal atrial fibrillation: Secondary | ICD-10-CM | POA: Diagnosis present

## 2015-12-01 DIAGNOSIS — M1990 Unspecified osteoarthritis, unspecified site: Secondary | ICD-10-CM | POA: Diagnosis present

## 2015-12-01 DIAGNOSIS — I959 Hypotension, unspecified: Secondary | ICD-10-CM | POA: Diagnosis not present

## 2015-12-01 DIAGNOSIS — R339 Retention of urine, unspecified: Secondary | ICD-10-CM | POA: Diagnosis not present

## 2015-12-01 DIAGNOSIS — Z7901 Long term (current) use of anticoagulants: Secondary | ICD-10-CM

## 2015-12-01 DIAGNOSIS — I110 Hypertensive heart disease with heart failure: Secondary | ICD-10-CM | POA: Diagnosis present

## 2015-12-01 DIAGNOSIS — E785 Hyperlipidemia, unspecified: Secondary | ICD-10-CM | POA: Diagnosis present

## 2015-12-01 DIAGNOSIS — Z85820 Personal history of malignant melanoma of skin: Secondary | ICD-10-CM | POA: Diagnosis not present

## 2015-12-01 DIAGNOSIS — Z96653 Presence of artificial knee joint, bilateral: Secondary | ICD-10-CM | POA: Diagnosis present

## 2015-12-01 DIAGNOSIS — Z881 Allergy status to other antibiotic agents status: Secondary | ICD-10-CM | POA: Diagnosis not present

## 2015-12-01 DIAGNOSIS — Z8249 Family history of ischemic heart disease and other diseases of the circulatory system: Secondary | ICD-10-CM | POA: Diagnosis not present

## 2015-12-01 DIAGNOSIS — Z882 Allergy status to sulfonamides status: Secondary | ICD-10-CM

## 2015-12-01 DIAGNOSIS — Z86010 Personal history of colonic polyps: Secondary | ICD-10-CM

## 2015-12-01 DIAGNOSIS — N400 Enlarged prostate without lower urinary tract symptoms: Secondary | ICD-10-CM | POA: Diagnosis present

## 2015-12-01 DIAGNOSIS — Z87891 Personal history of nicotine dependence: Secondary | ICD-10-CM | POA: Diagnosis not present

## 2015-12-01 DIAGNOSIS — Z888 Allergy status to other drugs, medicaments and biological substances status: Secondary | ICD-10-CM

## 2015-12-01 DIAGNOSIS — I69320 Aphasia following cerebral infarction: Secondary | ICD-10-CM | POA: Diagnosis not present

## 2015-12-01 DIAGNOSIS — I472 Ventricular tachycardia, unspecified: Secondary | ICD-10-CM

## 2015-12-01 DIAGNOSIS — I5022 Chronic systolic (congestive) heart failure: Secondary | ICD-10-CM | POA: Diagnosis present

## 2015-12-01 DIAGNOSIS — R000 Tachycardia, unspecified: Secondary | ICD-10-CM | POA: Diagnosis not present

## 2015-12-01 DIAGNOSIS — Z79899 Other long term (current) drug therapy: Secondary | ICD-10-CM

## 2015-12-01 DIAGNOSIS — Z823 Family history of stroke: Secondary | ICD-10-CM

## 2015-12-01 DIAGNOSIS — Z8673 Personal history of transient ischemic attack (TIA), and cerebral infarction without residual deficits: Secondary | ICD-10-CM | POA: Diagnosis not present

## 2015-12-01 DIAGNOSIS — R002 Palpitations: Secondary | ICD-10-CM | POA: Diagnosis present

## 2015-12-01 DIAGNOSIS — R5381 Other malaise: Secondary | ICD-10-CM | POA: Diagnosis not present

## 2015-12-01 DIAGNOSIS — R5383 Other fatigue: Secondary | ICD-10-CM

## 2015-12-01 DIAGNOSIS — I509 Heart failure, unspecified: Secondary | ICD-10-CM | POA: Diagnosis not present

## 2015-12-01 DIAGNOSIS — I69319 Unspecified symptoms and signs involving cognitive functions following cerebral infarction: Secondary | ICD-10-CM | POA: Diagnosis not present

## 2015-12-01 DIAGNOSIS — Z6827 Body mass index (BMI) 27.0-27.9, adult: Secondary | ICD-10-CM

## 2015-12-01 DIAGNOSIS — R079 Chest pain, unspecified: Secondary | ICD-10-CM

## 2015-12-01 LAB — CBC WITH DIFFERENTIAL/PLATELET
Basophils Absolute: 0 10*3/uL (ref 0–0.1)
Basophils Relative: 1 %
Eosinophils Absolute: 0.2 10*3/uL (ref 0–0.7)
Eosinophils Relative: 3 %
HEMATOCRIT: 45.6 % (ref 40.0–52.0)
HEMOGLOBIN: 15.4 g/dL (ref 13.0–18.0)
LYMPHS ABS: 2 10*3/uL (ref 1.0–3.6)
LYMPHS PCT: 30 %
MCH: 31.4 pg (ref 26.0–34.0)
MCHC: 33.7 g/dL (ref 32.0–36.0)
MCV: 93.4 fL (ref 80.0–100.0)
MONOS PCT: 8 %
Monocytes Absolute: 0.6 10*3/uL (ref 0.2–1.0)
NEUTROS ABS: 3.9 10*3/uL (ref 1.4–6.5)
NEUTROS PCT: 58 %
Platelets: 113 10*3/uL — ABNORMAL LOW (ref 150–440)
RBC: 4.89 MIL/uL (ref 4.40–5.90)
RDW: 13.2 % (ref 11.5–14.5)
WBC: 6.7 10*3/uL (ref 3.8–10.6)

## 2015-12-01 LAB — COMPREHENSIVE METABOLIC PANEL
ALK PHOS: 107 U/L (ref 38–126)
ALT: 47 U/L (ref 17–63)
ANION GAP: 10 (ref 5–15)
AST: 66 U/L — ABNORMAL HIGH (ref 15–41)
Albumin: 4.7 g/dL (ref 3.5–5.0)
BILIRUBIN TOTAL: 1.4 mg/dL — AB (ref 0.3–1.2)
BUN: 17 mg/dL (ref 6–20)
CALCIUM: 8.9 mg/dL (ref 8.9–10.3)
CHLORIDE: 100 mmol/L — AB (ref 101–111)
CO2: 26 mmol/L (ref 22–32)
Creatinine, Ser: 0.82 mg/dL (ref 0.61–1.24)
GFR calc non Af Amer: >60 mL/min (ref >60)
GLUCOSE: 153 mg/dL — AB (ref 65–99)
POTASSIUM: 3.8 mmol/L (ref 3.5–5.1)
Sodium: 136 mmol/L (ref 135–145)
Total Protein: 7.5 g/dL (ref 6.5–8.1)

## 2015-12-01 LAB — PROTIME-INR
INR: 1.22
PROTHROMBIN TIME: 15.6 s — AB (ref 11.4–15.0)

## 2015-12-01 LAB — MAGNESIUM: MAGNESIUM: 1.8 mg/dL (ref 1.7–2.4)

## 2015-12-01 LAB — TROPONIN I: Troponin I: <0.03 ng/mL (ref <0.031)

## 2015-12-01 LAB — APTT: APTT: 34 s (ref 24–36)

## 2015-12-01 LAB — TSH: TSH: 1.554 u[IU]/mL (ref 0.350–4.500)

## 2015-12-01 MED ORDER — AMIODARONE LOAD VIA INFUSION
150.0000 mg | Freq: Once | INTRAVENOUS | Status: DC
  Filled 2015-12-01: qty 83.34

## 2015-12-01 MED ORDER — AMIODARONE IV BOLUS ONLY 150 MG/100ML
150.0000 mg | Freq: Once | INTRAVENOUS | Status: DC
  Filled 2015-12-01: qty 100

## 2015-12-01 MED ORDER — AMIODARONE HCL IN DEXTROSE 360-4.14 MG/200ML-% IV SOLN
60.0000 mg/h | INTRAVENOUS | Status: DC
  Administered 2015-12-01 (×2): 60 mg/h via INTRAVENOUS
  Filled 2015-12-01: qty 200

## 2015-12-01 MED ORDER — ASPIRIN 81 MG PO CHEW
324.0000 mg | CHEWABLE_TABLET | ORAL | Status: AC
  Administered 2015-12-02: 324 mg via ORAL
  Filled 2015-12-01: qty 4

## 2015-12-01 MED ORDER — VITAMIN C 500 MG PO TABS
500.0000 mg | ORAL_TABLET | Freq: Every day | ORAL | Status: DC
  Administered 2015-12-02 – 2015-12-08 (×6): 500 mg via ORAL
  Filled 2015-12-01 (×7): qty 1

## 2015-12-01 MED ORDER — PROPOFOL 10 MG/ML IV BOLUS
20.0000 mg | Freq: Once | INTRAVENOUS | Status: AC
  Administered 2015-12-01: 20 mg via INTRAVENOUS

## 2015-12-01 MED ORDER — AMIODARONE HCL IN DEXTROSE 360-4.14 MG/200ML-% IV SOLN
60.0000 mg/h | INTRAVENOUS | Status: DC
  Administered 2015-12-01: 60 mg/h via INTRAVENOUS
  Filled 2015-12-01 (×2): qty 200

## 2015-12-01 MED ORDER — VERAPAMIL HCL 2.5 MG/ML IV SOLN
2.5000 mg | Freq: Once | INTRAVENOUS | Status: DC
  Filled 2015-12-01: qty 1

## 2015-12-01 MED ORDER — DEXTROSE 5 % IV SOLN: 60.0000 mg/h | Freq: Once | INTRAVENOUS | Status: DC

## 2015-12-01 MED ORDER — VERAPAMIL HCL ER 120 MG PO TBCR
120.0000 mg | EXTENDED_RELEASE_TABLET | Freq: Every day | ORAL | Status: DC
Start: 2015-12-02 — End: 2015-12-08
  Administered 2015-12-02 – 2015-12-07 (×5): 120 mg via ORAL
  Filled 2015-12-01 (×7): qty 1

## 2015-12-01 MED ORDER — ONDANSETRON HCL 4 MG/2ML IJ SOLN: 4.0000 mg | Freq: Four times a day (QID) | INTRAMUSCULAR | Status: DC | PRN

## 2015-12-01 MED ORDER — ATORVASTATIN CALCIUM 10 MG PO TABS
10.0000 mg | ORAL_TABLET | Freq: Every day | ORAL | Status: DC
  Filled 2015-12-01: qty 1

## 2015-12-01 MED ORDER — AMIODARONE LOAD VIA INFUSION: 150.0000 mg | Freq: Once | INTRAVENOUS | Status: DC

## 2015-12-01 MED ORDER — AMIODARONE IV BOLUS ONLY 150 MG/100ML
150.0000 mg | Freq: Once | INTRAVENOUS | Status: AC
Start: 2015-12-01 — End: 2015-12-01
  Administered 2015-12-01: 150 mg via INTRAVENOUS

## 2015-12-01 MED ORDER — TAMSULOSIN HCL 0.4 MG PO CAPS
0.8000 mg | ORAL_CAPSULE | Freq: Every day | ORAL | Status: DC
  Administered 2015-12-02 – 2015-12-08 (×6): 0.8 mg via ORAL
  Filled 2015-12-01 (×7): qty 2

## 2015-12-01 MED ORDER — AMIODARONE IV BOLUS ONLY 150 MG/100ML
INTRAVENOUS | Status: AC
  Filled 2015-12-01: qty 100

## 2015-12-01 MED ORDER — AMIODARONE IV BOLUS ONLY 150 MG/100ML
150.0000 mg | Freq: Once | INTRAVENOUS | Status: AC
  Administered 2015-12-01: 150 mg via INTRAVENOUS

## 2015-12-01 MED ORDER — SODIUM CHLORIDE 0.9 % IV BOLUS (SEPSIS)
1000.0000 mL | Freq: Once | INTRAVENOUS | Status: AC
  Administered 2015-12-01: 1000 mL via INTRAVENOUS

## 2015-12-01 MED ORDER — AMIODARONE IV BOLUS ONLY 150 MG/100ML
INTRAVENOUS | Status: AC
  Administered 2015-12-01: 150 mg via INTRAVENOUS
  Filled 2015-12-01: qty 100

## 2015-12-01 MED ORDER — VITAMIN D 1000 UNITS PO TABS
1000.0000 [IU] | ORAL_TABLET | Freq: Every day | ORAL | Status: DC
  Administered 2015-12-02 – 2015-12-08 (×6): 1000 [IU] via ORAL
  Filled 2015-12-01 (×7): qty 1

## 2015-12-01 MED ORDER — ASPIRIN 300 MG RE SUPP: 300.0000 mg | RECTAL | Status: AC

## 2015-12-01 MED ORDER — NITROGLYCERIN 0.4 MG SL SUBL
0.4000 mg | SUBLINGUAL_TABLET | SUBLINGUAL | Status: DC | PRN
Start: 2015-12-01 — End: 2015-12-08

## 2015-12-01 MED ORDER — AMIODARONE HCL IN DEXTROSE 360-4.14 MG/200ML-% IV SOLN
INTRAVENOUS | Status: AC
  Administered 2015-12-01: 60 mg/h via INTRAVENOUS
  Filled 2015-12-01: qty 200

## 2015-12-01 MED ORDER — PROPOFOL 10 MG/ML IV BOLUS
60.0000 mg | Freq: Once | INTRAVENOUS | Status: AC
  Administered 2015-12-01: 60 mg via INTRAVENOUS

## 2015-12-01 MED ORDER — SODIUM CHLORIDE 0.9 % IV BOLUS (SEPSIS)
500.0000 mL | Freq: Once | INTRAVENOUS | Status: AC
  Administered 2015-12-01: 500 mL via INTRAVENOUS

## 2015-12-01 MED ORDER — AMIODARONE HCL IN DEXTROSE 360-4.14 MG/200ML-% IV SOLN: 30.0000 mg/h | INTRAVENOUS | Status: DC

## 2015-12-01 MED ORDER — ADULT MULTIVITAMIN W/MINERALS CH
1.0000 | ORAL_TABLET | Freq: Every day | ORAL | Status: DC
  Administered 2015-12-02 – 2015-12-08 (×6): 1 via ORAL
  Filled 2015-12-01 (×7): qty 1

## 2015-12-01 MED ORDER — ACETAMINOPHEN 325 MG PO TABS: 650.0000 mg | ORAL_TABLET | ORAL | Status: DC | PRN

## 2015-12-01 MED ORDER — AMIODARONE HCL IN DEXTROSE 360-4.14 MG/200ML-% IV SOLN
60.0000 mg/h | INTRAVENOUS | Status: DC
  Administered 2015-12-01: 60 mg/h via INTRAVENOUS

## 2015-12-01 MED ORDER — APIXABAN 5 MG PO TABS
5.0000 mg | ORAL_TABLET | Freq: Two times a day (BID) | ORAL | Status: DC
  Administered 2015-12-02 – 2015-12-05 (×9): 5 mg via ORAL
  Filled 2015-12-01 (×10): qty 1

## 2015-12-01 MED ORDER — FINASTERIDE 5 MG PO TABS
5.0000 mg | ORAL_TABLET | Freq: Every day | ORAL | Status: DC
  Administered 2015-12-02 – 2015-12-08 (×6): 5 mg via ORAL
  Filled 2015-12-01 (×7): qty 1

## 2015-12-01 MED ORDER — PROPOFOL 1000 MG/100ML IV EMUL
INTRAVENOUS | Status: AC
  Administered 2015-12-01: 20 mg via INTRAVENOUS
  Filled 2015-12-01: qty 100

## 2015-12-01 NOTE — ED Notes (Signed)
Pt keeps making several attempts to void, insistent on standing up right with urinal, gave option to sit up right on the side of the bed to prevent increase in heart rate, patient complied. HR accelerated from 80s to 100-110, returned to 85 upon patient lying back down.

## 2015-12-01 NOTE — H&P (Addendum)
CARDIOLOGY CONSULT NOTE     Primary Care Physician: Kirk Ruths., MD   Admit Date: 12/01/2015  Reason for consultation: Ventricular tachycardia  Austin Warren is a 75 y.o. male with h/o verapamil sensitive ventricular tachycardia with right bundle superior axis relatively narrow QRS complex and positive concordance suggesting a septal origin and possible verapamil sensitivity, normal coronary arteries by prior cardiac cath on [id number], chronic systolic CHF, PAF s/p successful DCCV 10/22/2014 on Eliquis, HTN, HLD, and prior stroke who was recently seen in the ED on 11/18/2015 with VT s/p successful DCCV returned to [facility] ED today with recurrent VT s/p unsuccessful DCCV x 2 subsequently converting to NSR with amiodarone bolus 150 mg x 2, now on infusion.   He is followed by Dr. Caryl Comes, MD. Signal average Electrocardiogram was markedly abnormal. MRI 4/16>>There is nosignificant change from the prior study with persistent basal inferior and inferolateral late gadolinium enhancement slightly more pronounced on the current study and a small focal late gadolinium enhancement at the point of inferior RV attachment to the LV wall. He has also undergone fat pad biopsy>> neg. Echocardiogram 11/15 demonstrated left ventricular hypertrophy with an ejection fraction of 45-50% with hypokinesis of the inferior apical myocardium. He was initially tried on a beta blocker prescription. He had recurrent ventricular tachycardia. He was put on verapamil which worked quite well.At his last follow up with Dr. Caryl Comes on 10/06/2015 he had not had any further episodes of VT.   He also has had intercurrent stroke having been hospitalized 11/15. He was found to be in atrial fibrillation s/p DCCV 10/22/2014. He was treated with TPA. Apixaban was initiated. He has been tolerating this without evidence of bleeding. He has intellectual consequences and aphasia. He has had no recurrent atrial fibrillation of which he is  aware.  He presented to [facility] ED on 11/18/2015 with palpitations and just not feeling right. He was noted to be in VT. He underwent successful DCCV. Labs at that time showed a Mg++ 2.0, K+ 3.6, troponin <0.03, CBC unremarkable except for PLT count of 116. He remained in sinus rhythm in the ED and was continued on current medications and advised to follow up with cardiology.   Today, he had a usual morning. He exercised, and went for his usual walk. He had lunch and 30 minutes after lunch, did not feel well. He checked his heart rate and was found, on his monitor to have a heart rate greater than 199. He presented to the emergency room medical history medical where 12-lead showed monomorphic ventricular tachycardia with heart rate of 121. He had unsuccessful cardioversion 2. He received a bolus of 150 mg of amiodarone 2 and converted to sinus rhythm during the second bolus. He was placed on amiodarone infusion at that time. He had a second episode of tachycardia that occurred when he was standing up and urinating. He returned back to sinus rhythm. Per notes in epic, he stood up to void at 6:39 PM and went into ventricular tachycardia. He reverted back to sinus rhythm with no intervention. The last time he went into tachycardia was between 1922 to 1926, while resting and he returned to sinus rhythm, again with no intervention.  He denies any recent illness, nausea, vomiting, diarrhea, or increased pain. He has not missed a single dose of his verapamil.    Past Medical History  Diagnosis Date  . Hypertension   . Hyperlipidemia   . History of colon polyps   . Benign prostatic hypertrophy   .  Arthritis   . Ventricular tachycardia (HCC)     RBB/LAHB ideopathic VT, 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  . Osteoarthritis   . Dyspnea on exertion 11/01/2011  . Stroke ([place]) 05/2014  . UTI (urinary tract infection)   . Melanoma ([place])     under arm   Past Surgical History  Procedure Laterality Date  . Replacement  total knee    . Vasectomy    . Joint replacement      R TKR  . Replacement total knee Left   . Radiology with anesthesia N/A 05/18/2014    Procedure: RADIOLOGY WITH ANESTHESIA;  Surgeon: Rob Hickman, MD;  Location: [place];  Service: Radiology;  Laterality: N/A;  . Cardioversion    . Colonoscopy with propofol N/A 10/17/2015    Procedure: COLONOSCOPY WITH PROPOFOL;  Surgeon: Manya Silvas, MD;  Location: [facility] ENDOSCOPY;  Service: Endoscopy;  Laterality: N/A;       Allergies  Allergen Reactions  . Ciprofloxacin Anaphylaxis  . Levofloxacin Anaphylaxis    "throat closes"  . Enalapril Maleate Swelling    Angioedema  . Metoprolol Swelling    edema  . Vasotec [Enalaprilat] Other (See Comments)    Reaction: Angioedema  . Sulfa Antibiotics Rash    Social History   Social History  . Marital Status: Married    Spouse Name: N/A  . Number of Children: 2  . Years of Education: BS   Occupational History  . Not on file.   Social History Main Topics  . Smoking status: Former Smoker -- 2.00 packs/day for 10 years    Types: Cigarettes    Start date: 02/10/1960    Quit date: 02/09/1970  . Smokeless tobacco: Never Used     Comment: 05/2014    quit smoking  45 years ago  . Alcohol Use: Yes     Comment: occasionally  . Drug Use: No  . Sexual Activity: Not on file   Other Topics Concern  . Not on file   Social History Narrative   Patient is married with 1 living and 1 deceased child.   Patient is right handed.   Patient has BS degree.   Patient 1 cup daily.    Family History  Problem Relation Age of Onset  . Stroke Father   . Heart attack Neg Hx   . Hypertension Father     ROS- All systems are reviewed and negative except as per the HPI above  Physical Exam: Telemetry: Filed Vitals:   12/01/15 2330  Temp: 98.3 F (36.8 C)  TempSrc: Oral  Height: 5\' 7"  (1.702 m)  Weight: 173 lb 1 oz (78.5 kg)  BP 123/67 Pulse 63  GEN- The patient is well appearing,  alert and oriented x 3 today.   Head- normocephalic, atraumatic Eyes-  Sclera clear, conjunctiva pink Ears- hearing intact Oropharynx- clear Neck- supple, no JVP Lymph- no cervical lymphadenopathy Lungs- Clear to ausculation bilaterally, normal work of breathing Heart- Regular rate and rhythm, no murmurs, rubs or gallops, PMI not laterally displaced GI- soft, NT, ND, + BS Extremities- no clubbing, cyanosis, or edema MS- no significant deformity or atrophy Skin- no rash or lesion Psych- euthymic mood, full affect Neuro- strength and sensation are intact  EKG: Sinus rhythm. ECG in tachycardia shows wide complex tachycardia, rate 221, RBBB superior axis  Labs:   Lab Results  Component Value Date   WBC 6.7 12/01/2015   HGB 15.4 12/01/2015   HCT 45.6 12/01/2015   MCV 93.4 12/01/2015  PLT 113* 12/01/2015     Recent Labs Lab 12/01/15 1535  NA 136  K 3.8  CL 100*  CO2 26  BUN 17  CREATININE 0.82  CALCIUM 8.9  PROT 7.5  BILITOT 1.4*  ALKPHOS 107  ALT 47  AST 66*  GLUCOSE 153*   Lab Results  Component Value Date   CKTOTAL 47 05/27/2014   CKMB < 0.5* 05/27/2014   TROPONINI <0.03 12/01/2015    Lab Results  Component Value Date   CHOL 124 05/19/2014   Lab Results  Component Value Date   HDL 38* 05/19/2014   Lab Results  Component Value Date   LDLCALC 58 05/19/2014   Lab Results  Component Value Date   TRIG 142 05/19/2014   TRIG 183* 05/18/2014   Lab Results  Component Value Date   CHOLHDL 3.3 05/19/2014   No results found for: LDLDIRECT    Radiology: CXR FINDINGS: Cardiomediastinal silhouette is stable. No acute infiltrate or pleural effusion. No pulmonary edema. Bony thorax is unremarkable.  IMPRESSION: No active disease.  Echo 05/19/14: - Left ventricle: The cavity size was mildly dilated. Wall thickness was increased in a pattern of moderate LVH. There was mild concentric hypertrophy. Systolic function was mildly reduced. The  estimated ejection fraction was in the range of 45% to 50%. There is hypokinesis of the inferior and apical myocardium. Doppler parameters are consistent with high ventricular filling pressure. - Mitral valve: Calcified annulus. Mildly thickened leaflets . There was mild regurgitation. - Left atrium: The atrium was mildly dilated. - Right ventricle: The cavity size was at the upper limits of normal. Wall thickness was mildly increased. - Right atrium: The atrium was mildly dilated. - Pulmonary arteries: Systolic pressure was mildly to moderately increased. PA peak pressure: 47 mm Hg (S).  ASSESSMENT AND PLAN:   1. VT storm: At this time, it is unclear why he had VT storm. Has had verapamil sensitive VT in the past.  Will continue his PO verapamil currently.  He has had VT during amiodarone infusion, but is not had any since approximately 7:00 this evening. He has received multiple boluses of amiodarone and is currently on amiodarone drip. Due to that, we'll continue him on amiodarone, as his tachycardia has remained quiet over the past few hours. Should his tachycardia start again, will consider starting lidocaine to see if this helps to break his VT. As this is a potentially verapamil sensitive tachycardia, we'll also plan to give IV verapamil should his tachycardia recur. He may require defibrillator in the future or a repeat attempt at ablation, which had failed 5 years prior.  Will keep him NPO in case procedures are necessary in the AM.  Will also get a repeat TTE to further evaluate LVEF as he has not had a TTE since 2015.  2. Paroxysmal atrial fibrillation: Currently anticoagulated with Eliquis. We'll continue today. This patients CHA2DS2-VASc Score and unadjusted Ischemic Stroke Rate (% per year) is equal to 3.2 % stroke rate/year from a score of 3  Above score calculated as 1 point each if present [CHF, HTN, DM, Vascular=MI/PAD/Aortic Plaque, Age if 65-74, or Male] Above  score calculated as 2 points each if present [Age > 75, or Stroke/TIA/TE]  3. Chronic systolic heart failure: Does not appear volume overloaded at this time, not on a beta blocker, as has not tolerated in the past. We'll not diuresis at this time.  4. Hypertension: Well controlled currently. Will hold home medications, as he is in and  out of ventricular tachycardia and this could make him unstable.        Will Gerety Leeds, MD 12/01/2015  11:51 PM

## 2015-12-01 NOTE — Progress Notes (Signed)
    Please keep the patient's amiodarone infusion at full dose (33.3) all night per [name], MD.

## 2015-12-01 NOTE — Consult Note (Signed)
Cardiology Consultation Note  Patient ID: Austin Warren, MRN: [id number], DOB/AGE: [birth year] 75 y.o. Admit date: 12/01/2015   Date of Consult: 12/01/2015 Primary Physician: Kirk Ruths., MD Primary Cardiologist: Olin Pia, MD Requesting Physician: Jones Broom, MD  Chief Complaint: Palpitations Reason for Consult: VT  HPI: 75 y.o. male with h/o verapamil sensitive ventricular tachycardia with right bundle superior axis relatively narrow QRS complex and positive concordance suggesting a septal origin and possible verapamil sensitivity, normal coronary arteries by prior cardiac cath on [id number], chronic systolic CHF, PAF s/p successful DCCV 10/22/2014 on Eliquis, HTN, HLD, and prior stroke who was recently seen in the ED on 11/18/2015 with VT s/p successful DCCV returned to [facility] ED today with recurrent VT s/p unsuccessful DCCV x 2 subsequently converting to NSR with amiodarone bolus 150 mg x 2, now on infusion.   He is followed by Dr. Caryl Comes, MD. Signal average Electrocardiogram was markedly abnormal. MRI 4/16>>There is nosignificant change from the prior study with persistent basal inferior and inferolateral late gadolinium enhancement slightly more pronounced on the current study and a small focal late gadolinium enhancement at the point of inferior RV attachment to the LV wall. He has also undergone fat pad biopsy>> neg. Echocardiogram 11/15 demonstrated left ventricular hypertrophy with an ejection fraction of 45-50% with hypokinesis of the inferior apical myocardium. He was initially tried on a beta blocker prescription. He had recurrent ventricular tachycardia. He was put on verapamil which worked quite well.At his last follow up with Dr. Caryl Comes on 10/06/2015 he had not had any further episodes of VT.   He also has had intercurrent stroke having been hospitalized 11/15. He was found to be in atrial fibrillation s/p DCCV 10/22/2014. He was treated with TPA. Apixaban was initiated. He has been  tolerating this without evidence of bleeding. He has intellectual consequences and aphasia. He has had no recurrent atrial fibrillation of which he is aware.  He presented to [facility] ED on 11/18/2015 with palpitations and just not feeling right. He was noted to be in VT. He underwent successful DCCV. Labs at that time showed a Mg++ 2.0, K+ 3.6, troponin <0.03, CBC unremarkable except for PLT count of 116. He remained in sinus rhythm in the ED and was continued on current medications and advised to follow up with cardiology.   Today, he was feeling well. Shortly after eating lunch he developed palpitations and a sense of "just not feeling well." He checked his HR on his FitBit and this was 60 bpm. He did not feel this was accurate so he checked his BP and could not get a reading. Thus he presented to [facility] ED.   Upon the patient's arrival to [facility] initial 12 lead EKG showed sustained monomorphic ventricular tachycardia, 221 bpm. He underwent unsuccessful DCCV x 2. He received amiodarone bolus 150 mg x 2, converting to sinus rhythm during the 2nd bolus. He was subsequently placed on an amiodarone infusion at full rate. Labs are pending at the time of cardiology consult. Patient reports feeling good at this time, back to his baseline.   He denies any recent illness, nausea, vomiting, diarrhea, or increased pain. He has not missed a single dose of his verapamil.   Past Medical History  Diagnosis Date  . Hypertension   . Hyperlipidemia   . History of colon polyps   . Benign prostatic hypertrophy   . Arthritis   . Ventricular tachycardia (HCC)     RBB/LAHB ideopathic VT, 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  .  Osteoarthritis   . Dyspnea on exertion 11/01/2011  . Stroke ([place]) 05/2014  . UTI (urinary tract infection)   . Melanoma ([place])     under arm      Most Recent Cardiac Studies: Cardiac cath 11/07/2010: PROCEDURAL FINDINGS: Aortic pressure 133/70 with a mean of 96, left ventricular pressure of  128/21.  Left ventriculography shows low normal left ventricular systolic function. The estimated left ventricular ejection fraction is 50-55%. There is no significant mitral regurgitation. Coronary angiography: Left mainstem is angiographically normal and divides into the LAD and left circumflex.  LAD: The LAD reaches the left ventricular apex. The vessel supplies a moderate-caliber diagonal branch with no obstructive disease. The LAD proper also has no obstructive disease.  There is a moderate ramus intermedius branch with no obstructive disease. The AV groove circumflex is fairly small and supplies two OM branches. There is no obstructive disease in the left circumflex.  Right coronary artery: The RCA is a large, dominant vessel. The vessel was widely patent throughout its course. The origin of a large posterolateral branch has minor nonobstructive stenosis but the vessel is widely patent throughout, the PDA is widely patent as well.  FINAL ASSESSMENT: 1. Widely patent coronary arteries with minimal nonobstructive disease  of the posterolateral branch. 2. Low normal left ventricular systolic function.  RECOMMENDATIONS: The patient will have an EP evaluation for definitive treatment of his ventricular tachycardia. He does not have significant CAD.   Surgical History:  Past Surgical History  Procedure Laterality Date  . Replacement total knee    . Vasectomy    . Joint replacement      R TKR  . Replacement total knee Left   . Radiology with anesthesia N/A 05/18/2014    Procedure: RADIOLOGY WITH ANESTHESIA;  Surgeon: Rob Hickman, MD;  Location: [facility];  Service: Radiology;  Laterality: N/A;  . Cardioversion    . Colonoscopy with propofol N/A 10/17/2015    Procedure: COLONOSCOPY WITH PROPOFOL;  Surgeon: Manya Silvas, MD;  Location: [facility] ENDOSCOPY;  Service: Endoscopy;  Laterality: N/A;     Home Meds: Prior to Admission medications   Medication  Sig Start Date End Date Taking? Authorizing Provider  apixaban (ELIQUIS) 5 MG TABS tablet Take 1 tablet (5 mg total) by mouth 2 (two) times daily. 08/10/15   Deboraha Sprang, MD  Ascorbic Acid (VITAMIN C) 500 MG tablet Take 500 mg by mouth daily.      Historical Provider, MD  atorvastatin (LIPITOR) 10 MG tablet Take 10 mg by mouth.  03/07/15   Historical Provider, MD  Cholecalciferol (VITAMIN D3) 1000 UNITS CAPS Take 1,000 Units by mouth daily.     Historical Provider, MD  finasteride (PROSCAR) 5 MG tablet Take 5 mg by mouth daily.      Historical Provider, MD  Multiple Vitamin (MULTIVITAMIN WITH MINERALS) TABS tablet Take 1 tablet by mouth daily. One A Day 50+    Historical Provider, MD  Tamsulosin HCl (FLOMAX) 0.4 MG CAPS Take 0.8 mg by mouth daily.     Historical Provider, MD  torsemide (DEMADEX) 10 MG tablet Take 10 mg by mouth daily.  02/17/13   Historical Provider, MD  verapamil (CALAN-SR) 120 MG CR tablet Take 1 tablet (120 mg total) by mouth at bedtime. 08/10/15   Deboraha Sprang, MD    Inpatient Medications:    . amiodarone    . amiodarone    . amiodarone    . propofol  Allergies:  Allergies  Allergen Reactions  . Ciprofloxacin Anaphylaxis  . Levofloxacin Anaphylaxis    "throat closes"  . Enalapril Maleate Swelling    Angioedema  . Metoprolol Swelling    edema  . Vasotec [Enalaprilat] Other (See Comments)    Reaction: Angioedema  . Sulfa Antibiotics Rash    Social History   Social History  . Marital Status: Married    Spouse Name: N/A  . Number of Children: 2  . Years of Education: BS   Occupational History  . Not on file.   Social History Main Topics  . Smoking status: Former Smoker -- 2.00 packs/day for 10 years    Types: Cigarettes    Start date: 02/10/1960    Quit date: 02/09/1970  . Smokeless tobacco: Never Used     Comment: 05/2014    quit smoking  45 years ago  . Alcohol Use: Yes     Comment: occasionally  . Drug Use: No  . Sexual Activity: Not on  file   Other Topics Concern  . Not on file   Social History Narrative   Patient is married with 1 living and 1 deceased child.   Patient is right handed.   Patient has BS degree.   Patient 1 cup daily.     Family History  Problem Relation Age of Onset  . Stroke Father   . Heart attack Neg Hx   . Hypertension Father      Review of Systems: Review of Systems  Constitutional: Positive for malaise/fatigue. Negative for fever, chills, weight loss and diaphoresis.  HENT: Negative for congestion.   Eyes: Negative for discharge and redness.  Respiratory: Positive for shortness of breath. Negative for cough, hemoptysis, sputum production and wheezing.   Cardiovascular: Positive for palpitations. Negative for chest pain, orthopnea, claudication, leg swelling and PND.  Gastrointestinal: Negative for heartburn, nausea, vomiting, abdominal pain and diarrhea.  Musculoskeletal: Negative for myalgias and falls.  Skin: Negative for rash.  Neurological: Positive for dizziness and weakness. Negative for tingling, tremors, sensory change, speech change, focal weakness and loss of consciousness.  Endo/Heme/Allergies: Does not bruise/bleed easily.  Psychiatric/Behavioral: Negative for substance abuse. The patient is not nervous/anxious.   All other systems reviewed and are negative.   Labs: No results for input(s): CKTOTAL, CKMB, TROPONINI in the last 72 hours. Lab Results  Component Value Date   WBC 7.1 11/18/2015   HGB 15.5 11/18/2015   HCT 45.8 11/18/2015   MCV 95.0 11/18/2015   PLT 116* 11/18/2015   No results for input(s): NA, K, CL, CO2, BUN, CREATININE, CALCIUM, PROT, BILITOT, ALKPHOS, ALT, AST, GLUCOSE in the last 168 hours.  Invalid input(s): LABALBU Lab Results  Component Value Date   CHOL 124 05/19/2014   HDL 38* 05/19/2014   LDLCALC 58 05/19/2014   TRIG 142 05/19/2014   No results found for: DDIMER  Radiology/Studies:  Dg Chest 2 View  11/19/2015  CLINICAL DATA:   Cardioversion for atrial fibrillation. EXAM: CHEST  2 VIEW COMPARISON:  None. FINDINGS: The heart size is borderline. The hila and mediastinum are unremarkable. No pneumothorax. No pulmonary nodules, masses, or infiltrates. IMPRESSION: No active cardiopulmonary disease. Electronically Signed   By: Dorise Bullion III M.D   On: 11/19/2015 00:46    EKG: Initial EKG at 15:29, 12/01/2015 - sustained monomorphic ventricular tachycardia, 221 bpm. EKG at 15:59, 12/01/2015 - NSR, 80 bpm, RBBB, nonspecific st/t changes   Weights: There were no vitals filed for this visit.   Physical  Exam: Pulse 221. There is no weight on file to calculate BMI. General: Well developed, well nourished, in no acute distress. Head: Normocephalic, atraumatic, sclera non-icteric, no xanthomas, nares are without discharge.  Neck: Negative for carotid bruits. JVD not elevated. Lungs: Clear bilaterally to auscultation without wheezes, rales, or rhonchi. Breathing is unlabored. Heart: RRR with S1 S2. No murmurs, rubs, or gallops appreciated. Abdomen: Soft, non-tender, non-distended with normoactive bowel sounds. No hepatomegaly. No rebound/guarding. No obvious abdominal masses. Msk:  Strength and tone appear normal for age. Extremities: No clubbing or cyanosis. No edema.  Distal pedal pulses are 2+ and equal bilaterally. Neuro: Alert and oriented X 3. No facial asymmetry. No focal deficit. Moves all extremities spontaneously. Psych:  Responds to questions appropriately with a normal affect.    Assessment and Plan:   1. Recurrent sustained monomorphic ventricular tachycardia: -2 recent episodes within the past 2 weeks. The initial episode converting to sinus rhythm with successful DCCV on 11/18/2015. Returned to [facility] ED on 12/01/2015 with recurrent VT, unsuccessful DCCV x 2. Subsequently converting to NSR with IV amiodarone bolus 150 mg x 2. Now on amiodarone infusion, full rate. Asymptomatic currently.  -Continue IV amiodarone  infusion for now to load -Given this is a recurrence of VT, with 2 episodes in the past 2 weeks he will likely need long term PO amiodarone once he has been loaded with IV amiodarone -Check echo to evaluate LV systolic function -Check magnesium, potassium, and tsh -Have patient seen by EP for possible implantation of ICD for primary prevention given recurrence of VT -Continue verapamil 120 mg daily, consider titration if BP allows once off IV amiodarone  -Could also consider adding back Coreg, though this was previously discontinued for soft BP and bradycardia -May need repeat ischemic evaluation  2. PAF s/p DCCV as above: -He is currently in sinus rhythm  -On verapamil for rate control -Eliquis 5 mg bid -CHADS2VASc at least 6 (CHF, HTN, age x 2, stroke x 2)  3. Chronic systolic CHF: -He does not appear volume overloaded at this time -Not on beta blocker as above -Stable  4. HTN: -Stable -Continue current medications  5. History of stroke: -Eliquis as above   Melvern Banker, PA-C Pager: [phone] 12/01/2015, 4:19 PM

## 2015-12-01 NOTE — ED Notes (Signed)
Per MD non-rebreather discontinued, started on 2L nasal canula.

## 2015-12-01 NOTE — ED Notes (Signed)
Pt went in to [place] from 1922 to 1926 while resting, pt returned to normal sinus rhythm without intervention. Pt alert and oriented, denies pain at this time.

## 2015-12-01 NOTE — ED Notes (Signed)
Pt here from home with c/o fast heart rate. Pt reports hx of the same.

## 2015-12-01 NOTE — ED Notes (Signed)
Pt placed back in bed and returned to normal sinus rhythm without intervention.

## 2015-12-01 NOTE — ED Notes (Addendum)
Pt standing to urinate heart rhythm returned to vtach HR 223, pt alert and oriented and states he "feels ok". MD made aware, amiodarone bolus administered pt returned to NSR at 1732.

## 2015-12-01 NOTE — H&P (Signed)
[facility] [place] NAME: Austin Warren    MR#:  [id number]  DATE OF BIRTH:  March 30, 1941  DATE OF ADMISSION:  12/01/2015  PRIMARY CARE PHYSICIAN: Kirk Ruths., MD   REQUESTING/REFERRING PHYSICIAN: dr Edd Fabian  CHIEF COMPLAINT:   Palpitations and not feeling well. HISTORY OF PRESENT ILLNESS:  Austin Warren  is a 75 y.o. male with a known history of Chronic atrial fibrillation on oral anticoagulation, history of stable ventricular tachycardia has had multiple cardioversions and unsuccessful cardiac ablation in the past, hypertension, BPH, osteoarthritis comes to the emergency room accompanied by his wife with complaint of palpitation and not feeling well. Patient was found to have heart rate in the 200s. He was seen by ER physician immediately and was given IV conscious sedation and was shocked 2 in order to cardiovert him. Patient did not respond however then after received IV bolus of amiodarone on 50 mg and currently on IV amiodarone drip. Patient responded well thereafter. He got up to urinate on the bedside commode went back into V. tach which state which was stable with normal blood pressure in 224 heart rate. Spoke with [name] and recommended another one 50 mg IV bolus which brought his heart rate down into the 100s. Patient is being admitted for further evaluation and management.  PAST MEDICAL HISTORY:   Past Medical History  Diagnosis Date  . Hypertension   . Hyperlipidemia   . History of colon polyps   . Benign prostatic hypertrophy   . Arthritis   . Ventricular tachycardia (HCC)     RBB/LAHB ideopathic VT, 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  . Osteoarthritis   . Dyspnea on exertion 11/01/2011  . Stroke ([place]) 05/2014  . UTI (urinary tract infection)   . Melanoma ([place])     under arm    PAST SURGICAL HISTOIRY:   Past Surgical History  Procedure Laterality Date  . Replacement total knee    . Vasectomy    .  Joint replacement      R TKR  . Replacement total knee Left   . Radiology with anesthesia N/A 05/18/2014    Procedure: RADIOLOGY WITH ANESTHESIA;  Surgeon: Rob Hickman, MD;  Location: [facility];  Service: Radiology;  Laterality: N/A;  . Cardioversion    . Colonoscopy with propofol N/A 10/17/2015    Procedure: COLONOSCOPY WITH PROPOFOL;  Surgeon: Manya Silvas, MD;  Location: [facility] ENDOSCOPY;  Service: Endoscopy;  Laterality: N/A;    SOCIAL HISTORY:   Social History  Substance Use Topics  . Smoking status: Former Smoker -- 2.00 packs/day for 10 years    Types: Cigarettes    Start date: 02/10/1960    Quit date: 02/09/1970  . Smokeless tobacco: Never Used     Comment: 05/2014    quit smoking  45 years ago  . Alcohol Use: Yes     Comment: occasionally    FAMILY HISTORY:   Family History  Problem Relation Age of Onset  . Stroke Father   . Heart attack Neg Hx   . Hypertension Father     DRUG ALLERGIES:   Allergies  Allergen Reactions  . Ciprofloxacin Anaphylaxis  . Levofloxacin Anaphylaxis    "throat closes"  . Enalapril Maleate Swelling    Angioedema  . Metoprolol Swelling    edema  . Vasotec [Enalaprilat] Other (See Comments)    Reaction: Angioedema  . Sulfa Antibiotics Rash    REVIEW OF SYSTEMS:  Review of Systems  Constitutional: Negative for fever, chills and weight loss.  HENT: Negative for ear discharge, ear pain and nosebleeds.   Eyes: Negative for blurred vision, pain and discharge.  Respiratory: Negative for sputum production, shortness of breath, wheezing and stridor.   Cardiovascular: Positive for palpitations. Negative for chest pain, orthopnea and PND.  Gastrointestinal: Negative for nausea, vomiting, abdominal pain and diarrhea.  Genitourinary: Negative for urgency and frequency.  Musculoskeletal: Negative for back pain and joint pain.  Neurological: Positive for weakness. Negative for sensory change, speech change and focal weakness.   Psychiatric/Behavioral: Negative for depression and hallucinations. The patient is not nervous/anxious.   All other systems reviewed and are negative.    MEDICATIONS AT HOME:   Prior to Admission medications   Medication Sig Start Date End Date Taking? Authorizing Provider  apixaban (ELIQUIS) 5 MG TABS tablet Take 1 tablet (5 mg total) by mouth 2 (two) times daily. 08/10/15  Yes Deboraha Sprang, MD  Ascorbic Acid (VITAMIN C) 500 MG tablet Take 500 mg by mouth daily.     Yes Historical Provider, MD  atorvastatin (LIPITOR) 10 MG tablet Take 10 mg by mouth daily.  03/07/15  Yes Historical Provider, MD  Cholecalciferol (VITAMIN D3) 1000 UNITS CAPS Take 1,000 Units by mouth daily.    Yes Historical Provider, MD  finasteride (PROSCAR) 5 MG tablet Take 5 mg by mouth daily.     Yes Historical Provider, MD  Multiple Vitamin (MULTIVITAMIN WITH MINERALS) TABS tablet Take 1 tablet by mouth daily. One A Day 50+   Yes Historical Provider, MD  Tamsulosin HCl (FLOMAX) 0.4 MG CAPS Take 0.8 mg by mouth daily.    Yes Historical Provider, MD  torsemide (DEMADEX) 10 MG tablet Take 10 mg by mouth daily.  02/17/13  Yes Historical Provider, MD  verapamil (CALAN-SR) 120 MG CR tablet Take 1 tablet (120 mg total) by mouth at bedtime. 08/10/15  Yes Deboraha Sprang, MD      VITAL SIGNS:  Blood pressure 130/78, pulse 87, temperature 97.7 F (36.5 C), temperature source Oral, resp. rate 19, height 5\' 7"  (1.702 m), weight 80.7 kg (177 lb 14.6 oz), SpO2 100 %.  PHYSICAL EXAMINATION:  GENERAL:  75 y.o.-year-old patient lying in the bed with no acute distress.  EYES: Pupils equal, round, reactive to light and accommodation. No scleral icterus. Extraocular muscles intact.  HEENT: Head atraumatic, normocephalic. Oropharynx and nasopharynx clear.  NECK:  Supple, no jugular venous distention. No thyroid enlargement, no tenderness.  LUNGS: Normal breath sounds bilaterally, no wheezing, rales,rhonchi or crepitation. No use of  accessory muscles of respiration.  CARDIOVASCULAR: S1, S2 normal. No murmurs, rubs, or gallops. Tachycardia ABDOMEN: Soft, nontender, nondistended. Bowel sounds present. No organomegaly or mass.  EXTREMITIES: No pedal edema, cyanosis, or clubbing.  NEUROLOGIC: Cranial nerves II through XII are intact. Muscle strength 5/5 in all extremities. Sensation intact. Gait not checked.  PSYCHIATRIC: The patient is alert and oriented x 3.  SKIN: No obvious rash, lesion, or ulcer.   LABORATORY PANEL:   CBC  Recent Labs Lab 12/01/15 1535  WBC 6.7  HGB 15.4  HCT 45.6  PLT 113*   ------------------------------------------------------------------------------------------------------------------  Chemistries   Recent Labs Lab 12/01/15 1535  NA 136  K 3.8  CL 100*  CO2 26  GLUCOSE 153*  BUN 17  CREATININE 0.82  CALCIUM 8.9  AST 66*  ALT 47  ALKPHOS 107  BILITOT 1.4*   ------------------------------------------------------------------------------------------------------------------  Cardiac Enzymes  Recent Labs Lab 12/01/15 1535  TROPONINI <  0.03   ------------------------------------------------------------------------------------------------------------------  RADIOLOGY:  Dg Chest Portable 1 View  12/01/2015  CLINICAL DATA:  Chest pain, history of ventricular tachycardia EXAM: PORTABLE CHEST 1 VIEW COMPARISON:  11/19/2015 FINDINGS: Cardiomediastinal silhouette is stable. No acute infiltrate or pleural effusion. No pulmonary edema. Bony thorax is unremarkable. IMPRESSION: No active disease. Electronically Signed   By: Lahoma Crocker M.D.   On: 12/01/2015 16:26    EKG:  Wide-complex QRS tachycardia. LVH. LAD.  IMPRESSION AND PLAN:   Austin Warren  is a 75 y.o. male with a known history of Chronic atrial fibrillation on oral anticoagulation, history of stable ventricular tachycardia has had multiple cardioversions and unsuccessful cardiac ablation in the past, hypertension, BPH,  osteoarthritis comes to the emergency room accompanied by his wife with complaint of palpitation and not feeling well. Patient was found to have heart rate in the 200s.   1. Wide-complex tachycardia/stable ventricular tachycardia, recurrent -Admit to ICU -Attempt was made to cardiovert 2 in the emergency room however patient did not respond but did well after giving loading dose of IV amiodarone and currently on IV amiodarone drip. Patient had to be reloaded with amiodarone to run 150 mg one time after given his heart rate in the 200s with activity. -Seen by cardiology in the emergency room. -Continue oral anticoagulation -Echo of the heart  2. Hypertension. Resume home meds  3. Hyperlipidemia on Lipitor  4. BPH continue Proscar and Flomax  5. DVT prophylaxis patient is on Apixaban  All the records are reviewed and case discussed with ED provider. Management plans discussed with the patient, family and they are in agreement.  CODE STATUS: full  TOTAL Critical TIME TAKING CARE OF THIS PATIENT: 50 minutes.    Brnadon Eoff M.D on 12/01/2015 at 5:38 PM  Between 7am to 6pm - Pager - [phone]  After 6pm go to [email or web address] - password [email or web address] [facility]  Office  [phone]  CC: Primary care physician; Kirk Ruths., MD

## 2015-12-01 NOTE — ED Notes (Addendum)
Pt was attempting to void, while straining to void began to have BM, placed on bedside commode, and pt heart rhythm returned to Tanna Furry, MD made aware.

## 2015-12-01 NOTE — Discharge Summary (Signed)
[facility] [place] NAME: Austin Warren    MR#:  [id number]  DATE OF BIRTH:  14-May-1941  DATE OF ADMISSION:  12/01/2015 ADMITTING PHYSICIAN: No admitting provider for patient encounter.  DATE OF DISCHARGE: 12/01/15  PRIMARY CARE PHYSICIAN: Kirk Ruths., MD    ADMISSION DIAGNOSIS:  Fast heart rate  DISCHARGE DIAGNOSIS:  Active Problems:   Ventricular tachyarrhythmia ([place])   SECONDARY DIAGNOSIS:   Past Medical History  Diagnosis Date  . Hypertension   . Hyperlipidemia   . History of colon polyps   . Benign prostatic hypertrophy   . Arthritis   . Ventricular tachycardia (HCC)     RBB/LAHB ideopathic VT, 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  . Osteoarthritis   . Dyspnea on exertion 11/01/2011  . Stroke ([place]) 05/2014  . UTI (urinary tract infection)   . Melanoma ([place])     under arm    CHIEF COMPLAINT:   Chief Complaint  Patient presents with  . Tachycardia    HISTORY OF PRESENT ILLNESS ON ADMISSION/HOSPITAL COURSE:  Patient came to ED and was found to be in [place].  Has history of same with multiple prior cardioversions and failed cardiac ablation.  Not controlled here on amiodarone despite multiple bolus doses and maintenance drip.  Dr Rockey Situ consulted and following, recommended if patient is not able to be stabilized that he be transferred to [facility] for EP Cardiology support.  Transfer initiated and pt being accepted by Dr Curt Bears at [facility].  CONSULTS OBTAINED:  Treatment Team:  Minna Merritts, MD  DRUG ALLERGIES:   Allergies  Allergen Reactions  . Ciprofloxacin Anaphylaxis  . Levofloxacin Anaphylaxis    "throat closes"  . Enalapril Maleate Swelling    Angioedema  . Metoprolol Swelling    edema  . Vasotec [Enalaprilat] Other (See Comments)    Reaction: Angioedema  . Sulfa Antibiotics Rash    DISCHARGE MEDICATIONS:   Current Discharge Medication List    CONTINUE these medications which  have NOT CHANGED   Details  apixaban (ELIQUIS) 5 MG TABS tablet Take 1 tablet (5 mg total) by mouth 2 (two) times daily. Qty: 60 tablet, Refills: 3    Ascorbic Acid (VITAMIN C) 500 MG tablet Take 500 mg by mouth daily.      atorvastatin (LIPITOR) 10 MG tablet Take 10 mg by mouth daily.     Cholecalciferol (VITAMIN D3) 1000 UNITS CAPS Take 1,000 Units by mouth daily.     finasteride (PROSCAR) 5 MG tablet Take 5 mg by mouth daily.      Multiple Vitamin (MULTIVITAMIN WITH MINERALS) TABS tablet Take 1 tablet by mouth daily. One A Day 50+    Tamsulosin HCl (FLOMAX) 0.4 MG CAPS Take 0.8 mg by mouth daily.     torsemide (DEMADEX) 10 MG tablet Take 10 mg by mouth daily.     verapamil (CALAN-SR) 120 MG CR tablet Take 1 tablet (120 mg total) by mouth at bedtime. Qty: 30 tablet, Refills: 3        VITAL SIGNS:   Filed Vitals:   12/01/15 1945 12/01/15 2000 12/01/15 2015 12/01/15 2030  BP: 130/82 130/82 134/79 126/71  Pulse: 79 75 82 74  Temp:      TempSrc:      Resp: 14 18 18 23   Height:      Weight:      SpO2: 99% 99% 99% 98%   Wt Readings from Last 3 Encounters:  12/01/15 80.7 kg (  177 lb 14.6 oz)  10/17/15 73.483 kg (162 lb)  10/06/15 77.792 kg (171 lb 8 oz)    I/O:  No intake or output data in the 24 hours ending 12/01/15 2107  PHYSICAL EXAMINATION:  General:  Well dressed, well nourished HEENT: PERRL, EOMI Neck: supple, no masses, non tender Cardiovascular: tachycardic, no m/r/g Respiratory: CTA bilaterally, no wheeze, no rales, no ronchi Abdomen: soft, nontender, nondistended Skin: no rash, no lesions, no erythema, warm and dry  Lymphatic: No adenopathy Neurologic: Cranial nerves intact, sensation intact, no dysarthria, no aphasia Psychiatric: Alert, Oriented to - time, person, place, circumstance, patient is cooperative   DATA REVIEW:   CBC  Recent Labs Lab 12/01/15 1535  WBC 6.7  HGB 15.4  HCT 45.6  PLT 113*    Chemistries   Recent Labs Lab  12/01/15 1535  NA 136  K 3.8  CL 100*  CO2 26  GLUCOSE 153*  BUN 17  CREATININE 0.82  CALCIUM 8.9  MG 1.8  AST 66*  ALT 47  ALKPHOS 107  BILITOT 1.4*    Cardiac Enzymes  Recent Labs Lab 12/01/15 1535  TROPONINI <0.03    Microbiology Results  Results for orders placed or performed in visit on 05/03/15  Microscopic Examination     Status: Abnormal   Collection Time: 05/03/15  9:51 AM  Result Value Ref Range Status   WBC, UA 11-30 (A) 0 -  5 /hpf Final   RBC, UA 0-2 0 -  2 /hpf Final   Epithelial Cells (non renal) None seen 0 - 10 /hpf Final   Renal Epithel, UA None seen None seen /hpf Final   Bacteria, UA None seen None seen/Few Final    RADIOLOGY:  Dg Chest Portable 1 View  12/01/2015  CLINICAL DATA:  Chest pain, history of ventricular tachycardia EXAM: PORTABLE CHEST 1 VIEW COMPARISON:  11/19/2015 FINDINGS: Cardiomediastinal silhouette is stable. No acute infiltrate or pleural effusion. No pulmonary edema. Bony thorax is unremarkable. IMPRESSION: No active disease. Electronically Signed   By: Lahoma Crocker M.D.   On: 12/01/2015 16:26    EKG:   Orders placed or performed during the hospital encounter of 12/01/15  . EKG 12-Lead  . EKG 12-Lead  . EKG 12-Lead  . EKG 12-Lead     Management plans discussed with the patient and/or family.  CODE STATUS: Full Code Status History    Date Active Date Inactive Code Status Order ID Comments User Context   10/21/2014 10:55 AM 10/22/2014  3:48 AM Full Code [id number]  Sandi Mariscal, MD HOV   05/27/2014  4:00 PM 05/31/2014  5:58 PM Full Code [id number]  Albertine Patricia, MD Inpatient   05/18/2014  7:29 PM 05/25/2014 11:21 PM Full Code [id number]  Marliss Coots, PA-C Inpatient    Advance Directive Documentation        Most Recent Value   Type of Advance Directive  Living will   Pre-existing out of facility DNR order (yellow form or pink MOST form)     "MOST" Form in Place?         Tylan Kinn [place] 12/01/2015, 9:07  PM  [facility]  Office  [phone]  CC: Primary care physician; Kirk Ruths., MD

## 2015-12-01 NOTE — ED Provider Notes (Signed)
[facility] Emergency Department Provider Note   ____________________________________________  Time seen: Approximately 3:56 PM  I have reviewed the triage vital signs and the nursing notes.   HISTORY  Chief Complaint Tachycardia    HPI Austin Warren is a 75 y.o. male with hypertension, hyperlipidemia, history of paroxysmal atrial fibrillation on elliquis as well as recurrent ventricular tachycardia previously requiring cardioversion several times and presents for evaluation of palpitations and some mild chest discomfort which began approximately an hour ago at rest, no modifying factors, currently severe. Patient reports he has been compliant with all of his medications. No recent illness including no cough, vomiting, diarrhea, fevers or chills. No shortness of breath.   Past Medical History  Diagnosis Date  . Hypertension   . Hyperlipidemia   . History of colon polyps   . Benign prostatic hypertrophy   . Arthritis   . Ventricular tachycardia (HCC)     RBB/LAHB ideopathic VT, 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  . Osteoarthritis   . Dyspnea on exertion 11/01/2011  . Stroke ([place]) 05/2014  . UTI (urinary tract infection)   . Melanoma ([place])     under arm    Patient Active Problem List   Diagnosis Date Noted  . Encounter for general adult medical examination without abnormal findings 03/10/2015  . Arthritis 03/07/2015  . Microscopic hematuria 02/14/2015  . Bilateral renal cysts 02/14/2015  . 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 Amyloidosis ([place])   . Type 2 diabetes mellitus with other circulatory complications ([place]) [id number]  . HLD (hyperlipidemia) 07/02/2014  . Ischemic stroke ([place]) 06/21/2014  . Atrial fibrillation, chronic ([place]) 06/19/2014  . 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 Stroke ([place])   . Atrial fibrillation ([place]) 05/18/2014  . CVA (cerebral vascular accident) ([place]) 05/18/2014  . Essential hypertension  05/18/2014  . Diabetes mellitus ([place]) 05/18/2014  . A-fib ([place]) 05/18/2014  . Cerebral vascular accident ([place]) 05/18/2014  . Cerebrovascular accident (CVA) ([place]) 05/18/2014  . Acute ischemic stroke ([place])   . Benign prostatic hyperplasia with urinary obstruction 12/19/2013  . Diabetes ([place]) 12/19/2013  . Type 2 diabetes mellitus with other specified complication ([place]) [id number]  . Retroperitoneal mass 12/19/2013  . Type 2 diabetes mellitus ([place]) 12/19/2013  . low-voltage ECG 03/05/2013  . Hypersomnolence 03/05/2013  . Morbid obesity ([place]) 03/05/2013  . Obesity (BMI 30-39.9) 03/05/2013  . Difficulty staying awake 03/05/2013  . Abnormal prostate specific antigen 11/28/2012  . Elevated prostate specific antigen (PSA) 11/28/2012  . Dyspnea on exertion 11/01/2011  . Edema 11/01/2011  . Ventricular tachycardia ([place]) 12/01/2010  . Abnormal  signal averaged electrocardiogram 12/01/2010  . Idiopathic ventricular tachycardia ([place]) 12/01/2010    Past Surgical History  Procedure Laterality Date  . Replacement total knee    . Vasectomy    . Joint replacement      R TKR  . Replacement total knee Left   . Radiology with anesthesia N/A 05/18/2014    Procedure: RADIOLOGY WITH ANESTHESIA;  Surgeon: Rob Hickman, MD;  Location: [facility];  Service: Radiology;  Laterality: N/A;  . Cardioversion    . Colonoscopy with propofol N/A 10/17/2015    Procedure: COLONOSCOPY WITH PROPOFOL;  Surgeon: Manya Silvas, MD;  Location: [facility] ENDOSCOPY;  Service: Endoscopy;  Laterality: N/A;    Current Outpatient Rx  Name  Route  Sig  Dispense  Refill  . apixaban (ELIQUIS) 5 MG TABS tablet   Oral   Take 1 tablet (5 mg  total) by mouth 2 (two) times daily.   60 tablet   3   . Ascorbic Acid (VITAMIN C) 500 MG tablet   Oral   Take 500 mg by mouth daily.           Marland Kitchen atorvastatin (LIPITOR) 10 MG tablet   Oral   Take 10 mg by mouth daily.          . Cholecalciferol (VITAMIN D3) 1000 UNITS CAPS    Oral   Take 1,000 Units by mouth daily.          . finasteride (PROSCAR) 5 MG tablet   Oral   Take 5 mg by mouth daily.           . Multiple Vitamin (MULTIVITAMIN WITH MINERALS) TABS tablet   Oral   Take 1 tablet by mouth daily. One A Day 50+         . Tamsulosin HCl (FLOMAX) 0.4 MG CAPS   Oral   Take 0.8 mg by mouth daily.          Marland Kitchen torsemide (DEMADEX) 10 MG tablet   Oral   Take 10 mg by mouth daily.          . verapamil (CALAN-SR) 120 MG CR tablet   Oral   Take 1 tablet (120 mg total) by mouth at bedtime.   30 tablet   3     Allergies Ciprofloxacin; Levofloxacin; Enalapril maleate; Metoprolol; Vasotec; and Sulfa antibiotics  Family History  Problem Relation Age of Onset  . Stroke Father   . Heart attack Neg Hx   . Hypertension Father     Social History Social History  Substance Use Topics  . Smoking status: Former Smoker -- 2.00 packs/day for 10 years    Types: Cigarettes    Start date: 02/10/1960    Quit date: 02/09/1970  . Smokeless tobacco: Never Used     Comment: 05/2014    quit smoking  45 years ago  . Alcohol Use: Yes     Comment: occasionally    Review of Systems Constitutional: No fever/chills Eyes: No visual changes. ENT: No sore throat. Cardiovascular: + chest pain. Respiratory: Denies shortness of breath. Gastrointestinal: No abdominal pain.  No nausea, no vomiting.  No diarrhea.  No constipation. Genitourinary: Negative for dysuria. Musculoskeletal: Negative for back pain. Skin: Negative for rash. Neurological: Negative for headaches, focal weakness or numbness.  10-point ROS otherwise negative.  ____________________________________________   PHYSICAL EXAM:  Filed Vitals:   12/01/15 1553 12/01/15 1554 12/01/15 1557 12/01/15 1600  BP:  117/67 118/72 116/74  Pulse: 99 93 84 79  Resp: 21 21 20 20   SpO2: 100% 99% 100% 100%    VITAL SIGNS: ED Triage Vitals  Enc Vitals Group     BP --      Pulse Rate 12/01/15 1534 221      Resp --      Temp --      Temp src --      SpO2 --      Weight --      Height --      Head Cir --      Peak Flow --      Pain Score --      Pain Loc --      Pain Edu? --      Excl. in [place]? --     Constitutional: Alert and oriented. Nontoxic-appearing and in no acute distress. Sitting up in bed, mentating  appropriately, speaking normally. Eyes: Conjunctivae are normal. PERRL. EOMI. Head: Atraumatic. Nose: No congestion/rhinnorhea. Mouth/Throat: Mucous membranes are moist.  Oropharynx non-erythematous. Neck: No stridor. Supple without meningismus. Cardiovascular: Tachycardic rate, regular rhythm. Grossly normal heart sounds.  Good peripheral circulation. Respiratory: Normal respiratory effort.  No retractions. Lungs CTAB. Gastrointestinal: Soft and nontender. No distention.  No CVA tenderness. Genitourinary: deferred Musculoskeletal: No lower extremity tenderness nor edema.  No joint effusions. Neurologic:  Normal speech and language. No gross focal neurologic deficits are appreciated.  Skin:  Skin is warm, dry and intact. No rash noted. Psychiatric: Mood and affect are normal. Speech and behavior are normal.  ____________________________________________   LABS (all labs ordered are listed, but only abnormal results are displayed)  Labs Reviewed  CBC WITH DIFFERENTIAL/PLATELET - Abnormal; Notable for the following:    Platelets 113 (*)    All other components within normal limits  COMPREHENSIVE METABOLIC PANEL - Abnormal; Notable for the following:    Chloride 100 (*)    Glucose, Bld 153 (*)    AST 66 (*)    Total Bilirubin 1.4 (*)    All other components within normal limits  PROTIME-INR - Abnormal; Notable for the following:    Prothrombin Time 15.6 (*)    All other components within normal limits  TROPONIN I  APTT  MAGNESIUM  TSH   ____________________________________________  EKG  ED ECG REPORT I, Joanne Gavel, the attending physician, personally  viewed and interpreted this ECG.   Date: 12/01/2015  EKG Time: 15:29  Rate: 221  Rhythm: ventricular tachycardia  Axis: left  Intervals:none  ST&T Change: No acute ST elevation.  ED ECG REPORT I, Joanne Gavel, the attending physician, personally viewed and interpreted this ECG.   Date: 12/01/2015  EKG Time: 15:59  Rate: 80  Rhythm: normal sinus rhythm  Axis: normal  Intervals:none  ST&T Change: No acute ST elevation. Q waves in V1, V2.   ____________________________________________  RADIOLOGY  CXR IMPRESSION: No active disease.  ____________________________________________   PROCEDURES  Procedure(s) performed:  Procedural sedation Performed by: Loura Pardon A Consent: Verbal consent obtained. Risks and benefits: risks, benefits and alternatives were discussed Required items: required blood products, implants, devices, and special equipment available Patient identity confirmed: arm band and provided demographic data Time out: Immediately prior to procedure a "time out" was called to verify the correct patient, procedure, equipment, support staff and site/side marked as required.  Sedation type: moderate (conscious) sedation NPO time confirmed and considedered  Sedatives: PROPOFOL  Physician Time at Bedside: 20 minutes  Vitals: Vital signs were monitored during sedation. Cardiac Monitor, pulse oximeter Patient tolerance: Patient tolerated the procedure well with no immediate complications. Comments: Pt with uneventful recovered. Returned to pre-procedural sedation baseline  Synchronized cardioversion Verbal consent was obtained, risks benefits and alternatives were discussed. Patient was noted to be in persistent monomorphic ventricle tachycardia with heart rate in the 200s, mentating appropriately with stable blood pressure. He was attached to the zoll, cardioversion was attempted twice at 120 J under procedural sedation however there was recurrence  ventricular tachycardia without return to normal sinus rhythm. Patient tolerated the procedure well without any complications.     .  Critical Care performed: Yes, see critical care note(s).  CRITICAL CARE Performed by: Loura Pardon A   Total critical care time: 40 minutes  Critical care time was exclusive of separately billable procedures and treating other patients.  Critical care was necessary to treat or prevent imminent or life-threatening deterioration.  Critical care was time spent personally by me on the following activities: development of treatment plan with patient and/or surrogate as well as nursing, discussions with consultants, evaluation of patient's response to treatment, examination of patient, obtaining history from patient or surrogate, ordering and performing treatments and interventions, ordering and review of laboratory studies, ordering and review of radiographic studies, pulse oximetry and re-evaluation of patient's condition.  ____________________________________________   INITIAL IMPRESSION / ASSESSMENT AND PLAN / ED COURSE  Pertinent labs & imaging results that were available during my care of the patient were reviewed by me and considered in my medical decision making (see chart for details).  THO RADICE is a 75 y.o. male with hypertension, hyperlipidemia, history of paroxysmal atrial fibrillation on elliquis as well as recurrent ventricular tachycardia previously requiring cardioversion several times and presents for evaluation of palpitations and some mild chest discomfort. Arrival to the emergency department his heart rate was 220, EKG confirmed monomorphic ventricular tachycardia. He did have adequate blood pressure and was mentating appropriately. He initially received 150 mg of amiodarone IV with no improvement of his heart rate or change in rhythm. The decision was made  to cardiovert him under procedural sedation, he was consented for both  procedures. Attempt at synchronized cardioversion was made twice with no improvement. He then received an additional 150 mg of amiodarone and  converted to sinus rhythm a few minutes after the second bolus. The case was discussed with [name] and PA Dunn and the patient is currently on an amiodarone drip. He has no complaints at this time, denies any palpitations or chest pain.Currently he appears well, heart rate in the 70s, blood pressure 116/74. Case discussed with the hospital, Dr. Posey Pronto for admission at 4:45 PM. I reviewed his labs and the generally unremarkable with exception of mild AST elevation. ____________________________________________   FINAL CLINICAL IMPRESSION(S) / ED DIAGNOSES  Final diagnoses:  Chest pain, unspecified chest pain type  Ventricular tachycardia (HCC)      NEW MEDICATIONS STARTED DURING THIS VISIT:  New Prescriptions   No medications on file     Note:  This document was prepared using Dragon voice recognition software and may include unintentional dictation errors.    Joanne Gavel, MD 12/01/15 [phone]

## 2015-12-01 NOTE — ED Notes (Addendum)
Pt arrived alert and oriented, skin flushed, and tachypenic. Pt heart rhythm in Vtach with a pulse HR 210, amiodarone bolus started at 1540 and finished at 1543; 1 liter of fluid started at 1543. Pt still in [place] 208. Pt administered 60 mg of propofol at 1543, still alert and oriented, and an additional 20 mg of propofol administered at 1545. Pt still alert and oriented but remains in vtach after bolus of amiodarone, pt cardioverted at 120 joules at 1545, heart rate at 206. Pt remains alert and oriented with a pulse & heart rhythm still in vtach, cardioverted for the second time at 120 joules at 1546, heart rate at 210, BP at 109/61. 2nd bolus of amiodarone started at 1550, and finished at 1554.  Pt returned to normal sinus rhythm at 1553; BP 83/64, heart rate at 95, 2nd liter of fluid started on pressure bag. Pt alert and oriented and speaking in full sentences at 1555, still in normal sinus rhythm, denies pain, HR 88, BP 117/67, O2  97% on non re breather, RR 20. Amiodarone drip started at 60 mg/hr at 1557, and second liter of fluid stopped.

## 2015-12-01 NOTE — Progress Notes (Signed)
Patient ID: Austin Warren, male   DOB: 1941-03-15, 75 y.o.   MRN: [id number] See HPI. Pt still in ER on Amiodarone drip with recurrent VT. EP has accepted patinet at [facility]

## 2015-12-02 ENCOUNTER — Inpatient Hospital Stay (HOSPITAL_COMMUNITY): Payer: Medicare Other

## 2015-12-02 DIAGNOSIS — I509 Heart failure, unspecified: Secondary | ICD-10-CM

## 2015-12-02 LAB — BASIC METABOLIC PANEL
ANION GAP: 10 (ref 5–15)
BUN: 12 mg/dL (ref 6–20)
CHLORIDE: 103 mmol/L (ref 101–111)
CO2: 28 mmol/L (ref 22–32)
Calcium: 8.9 mg/dL (ref 8.9–10.3)
Creatinine, Ser: 0.78 mg/dL (ref 0.61–1.24)
GFR calc Af Amer: >60 mL/min (ref >60)
GFR calc non Af Amer: >60 mL/min (ref >60)
GLUCOSE: 109 mg/dL — AB (ref 65–99)
POTASSIUM: 4.4 mmol/L (ref 3.5–5.1)
SODIUM: 141 mmol/L (ref 135–145)

## 2015-12-02 LAB — CBC
HCT: 41.1 % (ref 39.0–52.0)
HEMOGLOBIN: 13.5 g/dL (ref 13.0–17.0)
MCH: 31.3 pg (ref 26.0–34.0)
MCHC: 32.8 g/dL (ref 30.0–36.0)
MCV: 95.1 fL (ref 78.0–100.0)
Platelets: 99 10*3/uL — ABNORMAL LOW (ref 150–400)
RBC: 4.32 MIL/uL (ref 4.22–5.81)
RDW: 13.3 % (ref 11.5–15.5)
WBC: 7.7 10*3/uL (ref 4.0–10.5)

## 2015-12-02 LAB — MRSA PCR SCREENING: MRSA BY PCR: NEGATIVE

## 2015-12-02 MED ORDER — AMIODARONE HCL 200 MG PO TABS
400.0000 mg | ORAL_TABLET | Freq: Two times a day (BID) | ORAL | Status: DC
  Administered 2015-12-02 – 2015-12-04 (×6): 400 mg via ORAL
  Filled 2015-12-02 (×6): qty 2

## 2015-12-02 MED ORDER — ATORVASTATIN CALCIUM 10 MG PO TABS
10.0000 mg | ORAL_TABLET | Freq: Every day | ORAL | Status: DC
  Administered 2015-12-02 – 2015-12-06 (×5): 10 mg via ORAL
  Filled 2015-12-02 (×5): qty 1

## 2015-12-02 NOTE — Progress Notes (Signed)
Pt HR noted to be in the 40's while asleep. Concern for low HR while on high dose amio. Pt stated that his HR is usually in the 50's to 60's while awake as he checks it at home. Cardiology MD notified. No orders.

## 2015-12-02 NOTE — Progress Notes (Signed)
SUBJECTIVE: The patient is doing well today.  At this time, he denies chest pain, shortness of breath, or any new concerns.  Marland Kitchen apixaban  5 mg Oral BID  . atorvastatin  10 mg Oral q1800  . cholecalciferol  1,000 Units Oral Daily  . finasteride  5 mg Oral Daily  . multivitamin with minerals  1 tablet Oral Daily  . tamsulosin  0.8 mg Oral Daily  . verapamil  120 mg Oral QHS  . vitamin C  500 mg Oral Daily      OBJECTIVE: Physical Exam: Filed Vitals:   12/02/15 0600 12/02/15 0700 12/02/15 0800 12/02/15 0913  BP: 97/60 110/63 109/61 109/54  Pulse:    50  Temp:    98.4 F (36.9 C)  TempSrc:    Oral  Resp: 13 11 18 13   Height:      Weight:      SpO2:    96%    Intake/Output Summary (Last 24 hours) at 12/02/15 1030 Last data filed at 12/02/15 0800  Gross per 24 hour  Intake 250.91 ml  Output      0 ml  Net 250.91 ml    Telemetry reveals sinus rhythm  GEN- The patient is well appearing, alert and oriented x 3 today.   Head- normocephalic, atraumatic Eyes-  Sclera clear, conjunctiva pink Ears- hearing intact Oropharynx- clear Neck- supple, no JVP Lymph- no cervical lymphadenopathy Lungs- Clear to ausculation bilaterally, normal work of breathing Heart- Regular rate and rhythm, no murmurs, rubs or gallops, PMI not laterally displaced GI- soft, NT, ND, + BS Extremities- no clubbing, cyanosis, or edema Skin- no rash or lesion Psych- euthymic mood, full affect Neuro- strength and sensation are intact  LABS: Basic Metabolic Panel:  Recent Labs  12/01/15 1535 12/02/15 0259  NA 136 141  K 3.8 4.4  CL 100* 103  CO2 26 28  GLUCOSE 153* 109*  BUN 17 12  CREATININE 0.82 0.78  CALCIUM 8.9 8.9  MG 1.8  --    Liver Function Tests:  Recent Labs  12/01/15 1535  AST 66*  ALT 47  ALKPHOS 107  BILITOT 1.4*  PROT 7.5  ALBUMIN 4.7   No results for input(s): LIPASE, AMYLASE in the last 72 hours. CBC:  Recent Labs  12/01/15 1535 12/02/15 0259  WBC 6.7 7.7    NEUTROABS 3.9  --   HGB 15.4 13.5  HCT 45.6 41.1  MCV 93.4 95.1  PLT 113* 99*   Cardiac Enzymes:  Recent Labs  12/01/15 1535  TROPONINI <0.03   BNP: Invalid input(s): POCBNP D-Dimer: No results for input(s): DDIMER in the last 72 hours. Hemoglobin A1C: No results for input(s): HGBA1C in the last 72 hours. Fasting Lipid Panel: No results for input(s): CHOL, HDL, LDLCALC, TRIG, CHOLHDL, LDLDIRECT in the last 72 hours. Thyroid Function Tests:  Recent Labs  12/01/15 1535  TSH 1.554   Anemia Panel: No results for input(s): VITAMINB12, FOLATE, FERRITIN, TIBC, IRON, RETICCTPCT in the last 72 hours.  RADIOLOGY: Dg Chest 2 View  11/19/2015  CLINICAL DATA:  Cardioversion for atrial fibrillation. EXAM: CHEST  2 VIEW COMPARISON:  None. FINDINGS: The heart size is borderline. The hila and mediastinum are unremarkable. No pneumothorax. No pulmonary nodules, masses, or infiltrates. IMPRESSION: No active cardiopulmonary disease. Electronically Signed   By: Dorise Bullion III M.D   On: 11/19/2015 00:46   Dg Chest Portable 1 View  12/01/2015  CLINICAL DATA:  Chest pain, history of ventricular tachycardia EXAM:  PORTABLE CHEST 1 VIEW COMPARISON:  11/19/2015 FINDINGS: Cardiomediastinal silhouette is stable. No acute infiltrate or pleural effusion. No pulmonary edema. Bony thorax is unremarkable. IMPRESSION: No active disease. Electronically Signed   By: Lahoma Crocker M.D.   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 Has had verapamil sensitive VT in the past. Quince Santana continue his PO verapamil currently. He has been on amiodarone without recurrence.  Wiley Flicker drop infusion rate to 30.  TTE pending.  May switch to PO amiodarone today if no further VT.  2. Paroxysmal atrial fibrillation: Currently anticoagulated with Eliquis. We'll continue today. This patients CHA2DS2-VASc Score and  unadjusted Ischemic Stroke Rate (% per year) is equal to 3.2 % stroke rate/year from a score of 3  Above score calculated as 1 point each if present [CHF, HTN, DM, Vascular=MI/PAD/Aortic Plaque, Age if 65-74, or Male] Above score calculated as 2 points each if present [Age > 75, or Stroke/TIA/TE]  3. Chronic systolic heart failure: Does not appear volume overloaded at this time, not on a beta blocker, as has not tolerated in the past. We'll not diuresis at this time.  4. Hypertension: Well controlled currently.   Aidan Moten Kosier Leeds, MD 12/02/2015 10:30 AM

## 2015-12-02 NOTE — Progress Notes (Signed)
  Echocardiogram 2D Echocardiogram has been performed.  Austin Warren 12/02/2015, 11:11 AM

## 2015-12-03 ENCOUNTER — Encounter (HOSPITAL_COMMUNITY): Payer: Self-pay | Admitting: *Deleted

## 2015-12-03 MED ORDER — AMIODARONE IV BOLUS ONLY 150 MG/100ML
150.0000 mg | Freq: Once | INTRAVENOUS | Status: AC
  Administered 2015-12-03: 150 mg via INTRAVENOUS
  Filled 2015-12-03: qty 100

## 2015-12-03 MED ORDER — ADENOSINE 6 MG/2ML IV SOLN
INTRAVENOUS | Status: AC
  Filled 2015-12-03: qty 2

## 2015-12-03 MED ORDER — MEXILETINE HCL 200 MG PO CAPS
200.0000 mg | ORAL_CAPSULE | Freq: Two times a day (BID) | ORAL | Status: DC
  Administered 2015-12-03 – 2015-12-05 (×5): 200 mg via ORAL
  Filled 2015-12-03 (×6): qty 1

## 2015-12-03 MED ORDER — VERAPAMIL HCL 2.5 MG/ML IV SOLN
5.0000 mg | Freq: Once | INTRAVENOUS | Status: DC
  Filled 2015-12-03: qty 2

## 2015-12-03 MED ORDER — AMIODARONE HCL IN DEXTROSE 360-4.14 MG/200ML-% IV SOLN
INTRAVENOUS | Status: AC
  Administered 2015-12-03: 18:00:00
  Filled 2015-12-03: qty 200

## 2015-12-03 NOTE — Progress Notes (Signed)
Called to the patient's room due to recurrent ventricular tachycardia.  Upon arrival, was in sustained monomorphic VT which resembled prior VT from Thursday.  The patient was sitting in the recliner when he went into VT.  He was active all day walking the halls.  The patient was minimally symptomatic with his VT, only aware of palpitations.  Exam was consistent for tachycardia, no murmurs, lungs clear.  HR on monitor was 197.  On hanging IV amiodarone, and after flushing IV, patient broke into sinus tachycardia.  Is currently in sinus rhythm with a HR of 82.  Plan to get IV verapamil as well.  For long term suppression, Omarri Eich plan to add mexiletine to see if this suppresses his arrhythmia.  Should he continue to have VT on current medications, may require ablation.  Leyana Whidden plan for TTE on Monday to reassess LVEF.  Jayquan Bradsher Curt Bears, MD 12/03/2015 5:53 PM

## 2015-12-03 NOTE — Significant Event (Signed)
Rapid Response Event Note  Overview:  Called by RN for HR 190's sustained Time Called: [id number] Arrival Time: [id number] Event Type: Cardiac  Initial Focused Assessment:  On my arrival to patients room, RN at bedside.  Patient lying in bed, states he feels OK bp 117/92.  Patient states he got up to go to BR and was sitting in chair when staff came in room.     Interventions:  MD called and at bedside.  150 mg IV bolus of amio given.  Patients HR broke to 89-92 just prior to hanging amio.  VSS.     Event Summary:  Patient moved to room 2 w 25.  RN to call if assistance needed   at      at          [facility], Harlin Rain

## 2015-12-03 NOTE — Care Management Important Message (Signed)
Important Message  Patient Details  Name: Austin Warren MRN: [id number] Date of Birth: 1940-09-28   Medicare Important Message Given:  Yes    [name] Abena 12/03/2015, 11:01 AM

## 2015-12-03 NOTE — Progress Notes (Signed)
   12/03/15 1704  Vitals  Temp 98.5 F (36.9 C)  Temp Source Oral  BP (!) 126/93 mmHg  BP Location Left Arm  BP Method Automatic  Patient Position (if appropriate) Lying  ECG Heart Rate (!) 193  Resp 18  Oxygen Therapy  SpO2 99 %  O2 Device Room Air  Pain Assessment  Pain Assessment No/denies pain   Pt HR alert in the 190s-low 200s. Otherwise pt VSS. 12 lead EKG obtained. Pt attached to [place] on monitor. MD and RRT made aware. On way to see patient. RN at bedside.  Fritz Pickerel, RN

## 2015-12-03 NOTE — Progress Notes (Addendum)
MD Ciccotto notified of pt's HR in the 40s at rest.  Pt asymptomatic.  New orders received to hold verapamil and give PM dose of amiodarone.  RN will cont to monitor.  Claudette Stapler, RN

## 2015-12-03 NOTE — Progress Notes (Signed)
SUBJECTIVE: The patient is doing well today.  At this time, he denies chest pain, shortness of breath, or any new concerns.  Marland Kitchen amiodarone  400 mg Oral BID  . apixaban  5 mg Oral BID  . atorvastatin  10 mg Oral q1800  . cholecalciferol  1,000 Units Oral Daily  . finasteride  5 mg Oral Daily  . multivitamin with minerals  1 tablet Oral Daily  . tamsulosin  0.8 mg Oral Daily  . verapamil  120 mg Oral QHS  . vitamin C  500 mg Oral Daily      OBJECTIVE: Physical Exam: Filed Vitals:   12/02/15 1700 12/02/15 1754 12/02/15 2019 12/03/15 0506  BP: 107/55 113/49 114/75 117/63  Pulse:  62 62 45  Temp:  98 F (36.7 C) 98 F (36.7 C) 97.8 F (36.6 C)  TempSrc:  Oral Oral Oral  Resp: 16 18 18 18   Height:      Weight:      SpO2:  100% 98% 100%    Intake/Output Summary (Last 24 hours) at 12/03/15 0818 Last data filed at 12/02/15 1600  Gross per 24 hour  Intake  596.9 ml  Output      1 ml  Net  595.9 ml    Telemetry reveals sinus rhythm  GEN- The patient is well appearing, alert and oriented x 3 today.   Head- normocephalic, atraumatic Eyes-  Sclera clear, conjunctiva pink Ears- hearing intact Oropharynx- clear Neck- supple, no JVP Lymph- no cervical lymphadenopathy Lungs- Clear to ausculation bilaterally, normal work of breathing Heart- Regular rate and rhythm, no murmurs, rubs or gallops, PMI not laterally displaced GI- soft, NT, ND, + BS Extremities- no clubbing, cyanosis, or edema Skin- no rash or lesion Psych- euthymic mood, full affect Neuro- strength and sensation are intact  LABS: Basic Metabolic Panel:  Recent Labs  12/01/15 1535 12/02/15 0259  NA 136 141  K 3.8 4.4  CL 100* 103  CO2 26 28  GLUCOSE 153* 109*  BUN 17 12  CREATININE 0.82 0.78  CALCIUM 8.9 8.9  MG 1.8  --    Liver Function Tests:  Recent Labs  12/01/15 1535  AST 66*  ALT 47  ALKPHOS 107  BILITOT 1.4*  PROT 7.5  ALBUMIN 4.7   No results for input(s): LIPASE, AMYLASE in the  last 72 hours. CBC:  Recent Labs  12/01/15 1535 12/02/15 0259  WBC 6.7 7.7  NEUTROABS 3.9  --   HGB 15.4 13.5  HCT 45.6 41.1  MCV 93.4 95.1  PLT 113* 99*   Cardiac Enzymes:  Recent Labs  12/01/15 1535  TROPONINI <0.03   BNP: Invalid input(s): POCBNP D-Dimer: No results for input(s): DDIMER in the last 72 hours. Hemoglobin A1C: No results for input(s): HGBA1C in the last 72 hours. Fasting Lipid Panel: No results for input(s): CHOL, HDL, LDLCALC, TRIG, CHOLHDL, LDLDIRECT in the last 72 hours. Thyroid Function Tests:  Recent Labs  12/01/15 1535  TSH 1.554   Anemia Panel: No results for input(s): VITAMINB12, FOLATE, FERRITIN, TIBC, IRON, RETICCTPCT in the last 72 hours.  RADIOLOGY: Dg Chest 2 View  11/19/2015  CLINICAL DATA:  Cardioversion for atrial fibrillation. EXAM: CHEST  2 VIEW COMPARISON:  None. FINDINGS: The heart size is borderline. The hila and mediastinum are unremarkable. No pneumothorax. No pulmonary nodules, masses, or infiltrates. IMPRESSION: No active cardiopulmonary disease. Electronically Signed   By: Dorise Bullion III M.D   On: 11/19/2015 00:46   Dg Chest Portable  1 View  12/01/2015  CLINICAL DATA:  Chest pain, history of ventricular tachycardia EXAM: PORTABLE CHEST 1 VIEW COMPARISON:  11/19/2015 FINDINGS: Cardiomediastinal silhouette is stable. No acute infiltrate or pleural effusion. No pulmonary edema. Bony thorax is unremarkable. IMPRESSION: No active disease. Electronically Signed   By: Lahoma Crocker M.D.   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 Has had verapamil sensitive VT in the past. Guynell Kleiber continue his PO verapamil currently. TTE shows decreased EF to 40-45%.  Andrez Lieurance continue amiodarone today but has been switched to PO amio.  No further VT.  Kieon Lawhorn continue at 400 mg BID today and recheck tomorrow to see if he has more VT.   Did discuss ICD therapy.  Has had normal EF with good response to medications.  He would like to avoid ICD if possible.  Without recurrence of VT and as he tolerated it well and was able to make it to the hospital, ICD therapy may be able to be avoided.  2. Paroxysmal atrial fibrillation: Currently anticoagulated with Eliquis. We'll continue today. This patients CHA2DS2-VASc Score and unadjusted Ischemic Stroke Rate (% per year) is equal to 3.2 % stroke rate/year from a score of 3  Above score calculated as 1 point each if present [CHF, HTN, DM, Vascular=MI/PAD/Aortic Plaque, Age if 65-74, or Male] Above score calculated as 2 points each if present [Age > 75, or Stroke/TIA/TE]  3. Chronic systolic heart failure: Does not appear volume overloaded at this time, not on a beta blocker, as has not tolerated in the past. We'll not diuresis at this time.  4. Hypertension: Well controlled currently.   Sarabeth Benton Sahni Leeds, MD 12/03/2015 8:18 AM

## 2015-12-04 ENCOUNTER — Other Ambulatory Visit (HOSPITAL_COMMUNITY): Payer: Medicare Other

## 2015-12-04 LAB — BASIC METABOLIC PANEL
Anion gap: 7 (ref 5–15)
BUN: 10 mg/dL (ref 6–20)
CHLORIDE: 102 mmol/L (ref 101–111)
CO2: 29 mmol/L (ref 22–32)
Calcium: 9.1 mg/dL (ref 8.9–10.3)
Creatinine, Ser: 0.89 mg/dL (ref 0.61–1.24)
GFR calc Af Amer: >60 mL/min (ref >60)
GFR calc non Af Amer: >60 mL/min (ref >60)
Glucose, Bld: 172 mg/dL — ABNORMAL HIGH (ref 65–99)
POTASSIUM: 4.2 mmol/L (ref 3.5–5.1)
SODIUM: 138 mmol/L (ref 135–145)

## 2015-12-04 LAB — MAGNESIUM: MAGNESIUM: 1.8 mg/dL (ref 1.7–2.4)

## 2015-12-04 NOTE — Progress Notes (Addendum)
Called echo technician on call to make aware the echo was not scheduled to be done today.  Should be tomorrow 12/05/15.  Will need to clarify whether TEE in endoscopy or echo via echo lab. Payton Emerald, RN  I spoke with patient and he is under the impression he will have the same test he had before.

## 2015-12-04 NOTE — Progress Notes (Signed)
Per night shift RN patient did not receive scheduled bedtime verapimil per Dr. Rosanna Randy due to HR 40's and BP 103/53.

## 2015-12-04 NOTE — Progress Notes (Signed)
Patient: Austin Warren / Admit Date: 12/01/2015 / Date of Encounter: 12/04/2015, 8:09 AM   Subjective: He had a recurrence of sustained monomorphic VT around 17:23 on 5/20 which resembled prior episode of VT. Patient was sitting in a recliner when he went into this rhythm. He ahd done a fair amount of ambulation in the afternoon preceding the above rhythm. He denied any presyncope, syncope, or dizziness. He was only aware of palpitations. HR at that time was 197 bpm. Exam was unremarkable except for tachycardia. He broke VT and went in to sinus tahcycardia upon hanging IV amidodarone and flushing IV. He was given IV verapamil and started on mexiletine in addition to his amiodarone. Given the above recurrence of ventricular arrhythmia it was advised he stay for today and have a repeat echocardiogram on 12/05/15. Labs are pending this morning.   This morning he is up sitting in the recliner. He does not have any complaints. No palpitations, SOB, presyncope, dizziness, or syncope. His is on room air. No chest pain.   Review of Systems: Review of Systems  Constitutional: Positive for malaise/fatigue. Negative for fever, chills, weight loss and diaphoresis.  HENT: Negative for congestion.   Eyes: Negative for discharge and redness.  Respiratory: Negative for cough, hemoptysis, sputum production, shortness of breath and wheezing.   Cardiovascular: Positive for palpitations. Negative for chest pain, orthopnea, claudication, leg swelling and PND.  Gastrointestinal: Negative for heartburn, nausea, vomiting and abdominal pain.  Musculoskeletal: Negative for myalgias and falls.  Skin: Negative for rash.  Neurological: Negative for dizziness, tingling, tremors, sensory change, speech change, focal weakness, loss of consciousness and weakness.  Endo/Heme/Allergies: Does not bruise/bleed easily.  Psychiatric/Behavioral: Negative for substance abuse. The patient is not nervous/anxious.   All other  systems reviewed and are negative.   Objective: Telemetry: NSR, 70's to 80's. 4 beats of NSVT at 4:18 AM and 1 AM. Sustained monomorphic VT at 17:23 on 5/20. Physical Exam: Blood pressure 112/58, pulse 53, temperature 98 F (36.7 C), temperature source Oral, resp. rate 16, height 5\' 7"  (1.702 m), weight 173 lb 1 oz (78.5 kg), SpO2 99 %. Body mass index is 27.1 kg/(m^2). General: Well developed, well nourished, in no acute distress. Head: Normocephalic, atraumatic, sclera non-icteric, no xanthomas, nares are without discharge. Neck: Negative for carotid bruits. JVP not elevated. Lungs: Clear bilaterally to auscultation without wheezes, rales, or rhonchi. Breathing is unlabored. Heart: RRR S1 S2 without murmurs, rubs, or gallops.  Abdomen: Soft, non-tender, non-distended with normoactive bowel sounds. No rebound/guarding. Extremities: No clubbing or cyanosis. No edema. Distal pedal pulses are 2+ and equal bilaterally. Neuro: Alert and oriented X 3. Moves all extremities spontaneously. Psych:  Responds to questions appropriately with a normal affect.  No intake or output data in the 24 hours ending 12/04/15 0809  Inpatient Medications:  . amiodarone  400 mg Oral BID  . apixaban  5 mg Oral BID  . atorvastatin  10 mg Oral q1800  . cholecalciferol  1,000 Units Oral Daily  . finasteride  5 mg Oral Daily  . mexiletine  200 mg Oral Q12H  . multivitamin with minerals  1 tablet Oral Daily  . tamsulosin  0.8 mg Oral Daily  . verapamil  120 mg Oral QHS  . verapamil  5 mg Intravenous Once  . vitamin C  500 mg Oral Daily   Infusions:    Labs:  Recent Labs  12/01/15 1535 12/02/15 0259  NA 136 141  K 3.8 4.4  CL 100* 103  CO2 26 28  GLUCOSE 153* 109*  BUN 17 12  CREATININE 0.82 0.78  CALCIUM 8.9 8.9  MG 1.8  --     Recent Labs  12/01/15 1535  AST 66*  ALT 47  ALKPHOS 107  BILITOT 1.4*  PROT 7.5  ALBUMIN 4.7    Recent Labs  12/01/15 1535 12/02/15 0259  WBC 6.7 7.7    NEUTROABS 3.9  --   HGB 15.4 13.5  HCT 45.6 41.1  MCV 93.4 95.1  PLT 113* 99*    Recent Labs  12/01/15 1535  TROPONINI <0.03   Invalid input(s): POCBNP No results for input(s): HGBA1C in the last 72 hours.   Weights: Filed Weights   12/01/15 2330  Weight: 173 lb 1 oz (78.5 kg)     Radiology/Studies:  Dg Chest 2 View  11/19/2015  CLINICAL DATA:  Cardioversion for atrial fibrillation. EXAM: CHEST  2 VIEW COMPARISON:  None. FINDINGS: The heart size is borderline. The hila and mediastinum are unremarkable. No pneumothorax. No pulmonary nodules, masses, or infiltrates. IMPRESSION: No active cardiopulmonary disease. Electronically Signed   By: Dorise Bullion III M.D   On: 11/19/2015 00:46   Dg Chest Portable 1 View  12/01/2015  CLINICAL DATA:  Chest pain, history of ventricular tachycardia EXAM: PORTABLE CHEST 1 VIEW COMPARISON:  11/19/2015 FINDINGS: Cardiomediastinal silhouette is stable. No acute infiltrate or pleural effusion. No pulmonary edema. Bony thorax is unremarkable. IMPRESSION: No active disease. Electronically Signed   By: Lahoma Crocker M.D.   On: 12/01/2015 16:26     Assessment and Plan   Active Problems:   VT (ventricular tachycardia) (HCC)  1. VT storm: -Currently in normal sinus rhythm  -Recurrent sustained monomorphic VT as above per HPI -Mexiletine added by EP on the evening of 5/20 given recurrence  -Has remained hemodynamically stable throughout his episodes of VT, should he become unstable would need DCCV -Should he continue to have recurrent episodes of VT he may require VT ablation  -Plan for TTE on Monday, 5/22, to assess LVSF (most recently 40-45% on 5/19 in the setting of sustained monomorphic VT) -If EF is less than 35% he may require ICD, will d/w MD -Lytes pending this morning  2. PAF: -Currently in sinus rhythm with heart rates in the 70's to 80's bpm -On verapamil for rate control -Eliquis 5 mg bid for full-dose anticoagulation -CHADS2VASc at  least 6 (CHF, HTN, age x 2, stroke x 2)  3. Chronic systolic CHF: -He does not appear volume overloaded at this time -He is not on a beta blocker given intolerance in the past -No indication for diuresis at this time  4. HTN: -Well controlled -Continue current regimen as above   Signed, Christell Faith, PA-C Pager: [phone] 12/04/2015, 8:09 AM  I have seen and examined this patient with Christell Faith.  Agree with above, note added to reflect my findings.  On exam, regular rhythm, no murmurs, lungs clear.  Went back into VT yesterday for 20 minutes with rate of 195.  Patient was fairly asymptomatic during VT with only palpitations as his complaint.  He broke on his own.  Mexiletine was started and he had no further VT overnight.  Will plan to watch him today with TTE tomorrow.  May require ablation for his VT should he have any further issues.  Will M. Camnitz MD 12/04/2015 8:40 AM

## 2015-12-05 ENCOUNTER — Inpatient Hospital Stay (HOSPITAL_COMMUNITY): Payer: Medicare Other

## 2015-12-05 DIAGNOSIS — R000 Tachycardia, unspecified: Secondary | ICD-10-CM

## 2015-12-05 DIAGNOSIS — I472 Ventricular tachycardia: Secondary | ICD-10-CM

## 2015-12-05 LAB — ECHOCARDIOGRAM COMPLETE
HEIGHTINCHES: 67 in
WEIGHTICAEL: 2768.98 [oz_av]

## 2015-12-05 MED ORDER — GADOBENATE DIMEGLUMINE 529 MG/ML IV SOLN
28.0000 mL | Freq: Once | INTRAVENOUS | Status: AC | PRN
  Administered 2015-12-05: 28 mL via INTRAVENOUS

## 2015-12-05 NOTE — Progress Notes (Signed)
SUBJECTIVE: The patient is doing well today.  At this time, he denies chest pain, shortness of breath, or any new concerns.  Feels well ambulating in the room.  Marland Kitchen apixaban  5 mg Oral BID  . atorvastatin  10 mg Oral q1800  . cholecalciferol  1,000 Units Oral Daily  . finasteride  5 mg Oral Daily  . mexiletine  200 mg Oral Q12H  . multivitamin with minerals  1 tablet Oral Daily  . tamsulosin  0.8 mg Oral Daily  . verapamil  120 mg Oral QHS  . verapamil  5 mg Intravenous Once  . vitamin C  500 mg Oral Daily      OBJECTIVE: Physical Exam: Filed Vitals:   12/04/15 0426 12/04/15 1415 12/04/15 2016 12/05/15 0612  BP: 112/58 109/59 123/70 113/59  Pulse: 53 55 50 59  Temp: 98 F (36.7 C) 98.1 F (36.7 C) 98 F (36.7 C) 97.5 F (36.4 C)  TempSrc: Oral Oral Oral Oral  Resp: 16 17 18 18   Height:      Weight:      SpO2: 99% 100% 99% 99%    Intake/Output Summary (Last 24 hours) at 12/05/15 1254 Last data filed at 12/04/15 1300  Gross per 24 hour  Intake    240 ml  Output      0 ml  Net    240 ml    Telemetry reveals sinus rhythm, no VT in the last 24 hours  GEN- The patient is well appearing, alert and oriented x 3 today.   Head- normocephalic, atraumatic Eyes-  Sclera clear, conjunctiva pink Ears- hearing intact Oropharynx- clear Neck- supple, no JVP Lungs- Clear to ausculation bilaterally, normal work of breathing Heart- Regular rate and rhythm, no significant murmurs, no rubs or gallops GI- soft, NT, ND Extremities- no clubbing, cyanosis, or edema Skin- no rash or lesion Psych- euthymic mood, full affect Neuro- no gross deficits appreciated  LABS: Basic Metabolic Panel:  Recent Labs  12/04/15 0825  NA 138  K 4.2  CL 102  CO2 29  GLUCOSE 172*  BUN 10  CREATININE 0.89  CALCIUM 9.1  MG 1.8     ASSESSMENT AND PLAN:  1. VT storm: -Currently in normal sinus rhythm  -Mexiletine started -Has remained hemodynamically stable throughout his episodes of  VT, should he become unstable would need DCCV -repeat TTE is pending reading - Austin Warr do cardiac MRI today -If EF is less than 35% he may require ICD, pending MRI as well for final POC, poss VT ablation this week, likely to d/c and come back as out patient   2. PAF: -remains in SR -On verapamil for rate control -Eliquis 5 mg bid for full-dose anticoagulation -CHADS2VASc at least 6 (CHF, HTN, age x 2, stroke x 2)  3. Chronic systolic CHF: -He does not appear volume overloaded at this time -He is not on a beta blocker given intolerance in the past -No indication for diuresis at this time  4. HTN: -Well controlled -Continue current regimen as above  Tommye Standard, PA-C 12/05/2015 12:54 PM   I have seen and examined this patient with Tommye Standard.  Agree with above, note added to reflect my findings.  On exam, regular rhythm, no murmurs, lungs clear.  Has not had further VT since Saturday.  Austin Warren stop amiodarone as he has been bradycardic and continue Mexiletine.  TTE today pending.  Austin Warren plan for MRI to see if he has any scar that could explain new  VT.  Plan for VT ablation Wednesday.  Austin Warren hold Mexiletine tomorrow AM and hold Eliquis tomorrow as well.  Austin Warren M. Brinna Divelbiss MD 12/05/2015 2:24 PM

## 2015-12-05 NOTE — Progress Notes (Signed)
  Echocardiogram 2D Echocardiogram has been performed.  Darlina Sicilian M 12/05/2015, 8:52 AM

## 2015-12-05 NOTE — Discharge Summary (Signed)
ELECTROPHYSIOLOGY PROCEDURE DISCHARGE SUMMARY    Patient ID: Austin Warren,  MRN: [id number], DOB/AGE: 02-19-1941 75 y.o.  Admit date: 12/01/2015 Discharge date: 12/08/15  Primary Care Physician: Kirk Ruths., MD Primary Cardiologist/Electrophysiologist: Dr. Caryl Comes    Primary Discharge Diagnosis:  VT  Secondary Discharge Diagnosis:  1. PAfib     CHA2DS2Vasc is at least 4 on Eliquis 2. HTN  Allergies  Allergen Reactions  . Ciprofloxacin Anaphylaxis  . Levofloxacin Anaphylaxis    "throat closes"  . Enalapril Maleate Swelling    Angioedema  . Metoprolol Swelling    edema  . Vasotec [Enalaprilat] Other (See Comments)    Reaction: Angioedema  . Sulfa Antibiotics Rash     Procedures This Admission:  12/07/15: EPS, Dr. Curt Bears CONCLUSIONS: 1. Sinus rhythm upon presentation.  2. 3-D mapping of the left ventricle without evidence of endocardial scar. 3. Pacing of the right ventricular apex which induced ventricular flutter 4. Cardioversion 5. No early apparent complications.  Brief HPI: Patient arrived to [facility] with recurrent VT storm.  He had only vague c/o "not feeling well", at home checked his pulse and got 199bpm and sought attention.  12/05/15: Echocardiogram Study Conclusions - Left ventricle: The cavity size was mildly dilated. Systolic  function was mildly to moderately reduced. The estimated ejection  fraction was in the range of 40% to 45% with hypokinesis worse at  the apex. - Aortic valve: There was mild regurgitation. - Mitral valve: There was mild regurgitation. - Left atrium: The atrium was mildly dilated. - Pulmonary arteries: PA peak pressure: 52 mm Hg (S).  12/05/15: Cardiac MRI IMPRESSION: 1) Moderate LVE with diffuse hypokinesis worse in apex and basal inferolateral wall EF 43% 2) Subendocardial scar in basal inferolateral wall 3) Normal RV 4) Mild to moderate TR and MR 5) Moderate LAE 6) Mild aortic root  enlargement Compared to MRI done 10/20/14 the EF is similar (45%). The basal inferolateral scar is actually less impressive on this scan and the uptake at the RV insertion sight not seen on this Scan. Aortic root also mildly dilated 3.8 cm then.   Hospital Course:  Austin Warren is a 75 y.o. male with h/o verapamil sensitive ventricular tachycardia with right bundle superior axis relatively narrow QRS complex and positive concordance suggesting a septal origin and possible verapamil sensitivity, normal coronary arteries by prior cardiac cath on [id number], chronic systolic CHF, PAF s/p successful DCCV 10/22/2014 on Eliquis, HTN, HLD, and prior stroke who was recently seen in the ED on 11/18/2015 with VT s/p successful DCCV returned to [facility] ED 12/01/15 with recurrent VT s/p unsuccessful DCCV x 2 subsequently converting to NSR with amiodarone bolus 150 mg x 2, then an infusion, he had episodes of recurrent VT over the weekend that self terminated, and started on Mexiletine, eventually transtioned to PO amiodarone.  The VT was quieted, planned for repet echo to reassess  His LV and WMA, to help guide his POC, with VT ablation in mind.  It was decided to repeat his cardiac MRI.  The result of which was not resulted until late in the evening with plans to pursue VT ablation his discharge held to get his ablation done.   The patient underwent EPS on 12/07/15 by Dr. Curt Bears, no ablation given no scar was located .  He was monitored overnight on telemetry that showed SR only with no further VT.  His groin sites are stable, no bleeding or hematoma, non-tender.  Lengthy discussion with the patient,  his wife by Dr. Curt Bears regarding ICD vs medical therapy alone as options going forward.  The patient is comfortable with medication only for now prior to going forward with an implant, follow with Dr. Caryl Comes has been arranged.  Post procedure activity restrictions/instructions were reviewed with the patient, he has been  examined by Dr. Curt Bears and considered stable for discharge  He Dawnyel Leven go home on Mexiletine 200mg  BID.   Physical Exam: Filed Vitals:   12/08/15 0321 12/08/15 0400 12/08/15 0750 12/08/15 0816  BP: 108/48 123/48 115/60   Pulse: 40 45 43   Temp: 97.9 F (36.6 C)   97.8 F (36.6 C)  TempSrc: Oral   Oral  Resp: 8 16 16    Height:      Weight:      SpO2: 97% 98% 99%     Physical Exam  Constitutional: He is oriented to person, place, and time. He appears well-developed and well-nourished. No distress.  HENT:  Head: Normocephalic.  Mouth/Throat: Oropharynx is clear and moist.  Eyes: Conjunctivae are normal. Pupils are equal, round, and reactive to light.  Neck: Normal range of motion. Neck supple.  Cardiovascular: Normal rate, regular rhythm, normal heart sounds and intact distal pulses.   Pulmonary/Chest: Effort normal and breath sounds normal.  Abdominal: Soft. There is no tenderness.  Musculoskeletal: Normal range of motion.  Neurological: He is alert and oriented to person, place, and time.  Skin: Skin is warm and dry.  Psychiatric: He has a normal mood and affect.   Labs:   Lab Results  Component Value Date   WBC 7.7 12/02/2015   HGB 13.5 12/02/2015   HCT 41.1 12/02/2015   MCV 95.1 12/02/2015   PLT 99* 12/02/2015    Recent Labs Lab 12/01/15 1535  12/04/15 0825  NA 136  < > 138  K 3.8  < > 4.2  CL 100*  < > 102  CO2 26  < > 29  BUN 17  < > 10  CREATININE 0.82  < > 0.89  CALCIUM 8.9  < > 9.1  PROT 7.5  --   --   BILITOT 1.4*  --   --   ALKPHOS 107  --   --   ALT 47  --   --   AST 66*  --   --   GLUCOSE 153*  < > 172*  < > = values in this interval not displayed.  Discharge Medications:    Medication List    TAKE these medications        apixaban 5 MG Tabs tablet  Commonly known as:  ELIQUIS  Take 1 tablet (5 mg total) by mouth 2 (two) times daily.     atorvastatin 10 MG tablet  Commonly known as:  LIPITOR  Take 10 mg by mouth daily.     finasteride  5 MG tablet  Commonly known as:  PROSCAR  Take 5 mg by mouth daily.     mexiletine 200 MG capsule  Commonly known as:  MEXITIL  Take 1 capsule (200 mg total) by mouth 2 (two) times daily.     multivitamin with minerals Tabs tablet  Take 1 tablet by mouth daily. One A Day 50+     tamsulosin 0.4 MG Caps capsule  Commonly known as:  FLOMAX  Take 0.8 mg by mouth daily.     torsemide 10 MG tablet  Commonly known as:  DEMADEX  Take 10 mg by mouth daily.     verapamil  120 MG CR tablet  Commonly known as:  CALAN-SR  Take 1 tablet (120 mg total) by mouth at bedtime.     vitamin C 500 MG tablet  Commonly known as:  ASCORBIC ACID  Take 500 mg by mouth daily.     Vitamin D3 1000 units Caps  Take 1,000 Units by mouth daily.        Disposition:  Home  Follow-up Information    Follow up with Briselda Naval Satter Leeds, MD On 01/03/2016.   Specialty:  Cardiology   Why:  11:00AM   Contact information:   [address] [place] [address] [phone]       Duration of Discharge Encounter: Greater than 30 minutes including physician time.  Venetia Night, PA-C  12/08/2015 9:24 AM      I have seen and examined this patient with Tommye Standard.  Agree with above, note added to reflect my findings.  On exam, regular rhythm, no murmurs, lungs clear.  Admitted with VT storm and started on amiodarone.  Continued to have VT and started on Mexiletine.  Mexiletine was held and VT ablation was attempted but induced ventricular flutter and no further arrhythmia.  Mapping showed no evidence of endocardial scar and no ablation was performed.  Discussed the option of ICD with the patient.  With no scar and hemodynamically tolerated VT with and EF of 43%, patient has opted to try medical therapy.  Frederica Chrestman discharge on mexiletine with follow     Fard Borunda M. Jojo Geving MD 12/08/2015 12:54 PM

## 2015-12-06 MED ORDER — BISACODYL 5 MG PO TBEC
5.0000 mg | DELAYED_RELEASE_TABLET | Freq: Every day | ORAL | Status: DC | PRN
  Administered 2015-12-06: 5 mg via ORAL
  Filled 2015-12-06: qty 1

## 2015-12-06 MED ORDER — MAGNESIUM HYDROXIDE 400 MG/5ML PO SUSP
30.0000 mL | Freq: Every day | ORAL | Status: DC | PRN
  Administered 2015-12-06: 30 mL via ORAL
  Filled 2015-12-06: qty 30

## 2015-12-06 NOTE — Progress Notes (Signed)
Patient complaints of constipation Milk of Magnesia given, and after breakfast prune juice given also. Will monitor patient.Takeru Bose, Bettina Gavia RN

## 2015-12-06 NOTE — Progress Notes (Signed)
Utilization review completed.  

## 2015-12-06 NOTE — Care Management Important Message (Signed)
Important Message  Patient Details  Name: Austin Warren MRN: [id number] Date of Birth: Oct 09, 1940   Medicare Important Message Given:  Yes    Nathen May 12/06/2015, 10:26 AM

## 2015-12-06 NOTE — Progress Notes (Signed)
    SUBJECTIVE: The patient is doing well today.  At this time, he denies chest pain, shortness of breath, or any new concerns.  Feels well ambulating in the room.  Marland Kitchen apixaban  5 mg Oral BID  . atorvastatin  10 mg Oral q1800  . cholecalciferol  1,000 Units Oral Daily  . finasteride  5 mg Oral Daily  . mexiletine  200 mg Oral Q12H  . multivitamin with minerals  1 tablet Oral Daily  . tamsulosin  0.8 mg Oral Daily  . verapamil  120 mg Oral QHS  . verapamil  5 mg Intravenous Once  . vitamin C  500 mg Oral Daily      OBJECTIVE: Physical Exam: Filed Vitals:   12/05/15 1400 12/05/15 2041 12/05/15 2159 12/06/15 0401  BP: 118/59 119/54 110/50 115/60  Pulse: 56 46 70 53  Temp: 98.1 F (36.7 C) 98.1 F (36.7 C)  98 F (36.7 C)  TempSrc: Oral Oral  Oral  Resp: 17 18  18   Height:      Weight:      SpO2: 100% 99%  98%    Intake/Output Summary (Last 24 hours) at 12/06/15 0705 Last data filed at 12/05/15 1630  Gross per 24 hour  Intake    240 ml  Output      2 ml  Net    238 ml    Telemetry reveals sinus rhythm, no VT in the last 24 hours  GEN- The patient is well appearing, alert and oriented x 3 today.   Head- normocephalic, atraumatic Eyes-  Sclera clear, conjunctiva pink Ears- hearing intact Oropharynx- clear Neck- supple, no JVP Lungs- Clear to ausculation bilaterally, normal work of breathing Heart- Regular rate and rhythm, no significant murmurs, no rubs or gallops GI- soft, NT, ND Extremities- no clubbing, cyanosis, or edema Skin- no rash or lesion Psych- euthymic mood, full affect Neuro- no gross deficits appreciated  LABS: Basic Metabolic Panel:  Recent Labs  12/04/15 0825  NA 138  K 4.2  CL 102  CO2 29  GLUCOSE 172*  BUN 10  CREATININE 0.89  CALCIUM 9.1  MG 1.8     ASSESSMENT AND PLAN:  1. VT storm: -Currently in normal sinus rhythm  -Mexiletine started, Jaelle Campanile hold tonight in prep for procedure. -Has remained hemodynamically stable  throughout his episodes of VT, should he become unstable would need DCCV -repeat TTE is pending reading - MRI shows inferolateral basal scar and EF 43% -Plan for VT ablation tomorrow   2. PAF: -remains in SR -On verapamil for rate control -Eliquis 5 mg bid for full-dose anticoagulation, Kadedra Vanaken hold in prep for VT ablation -CHADS2VASc at least 6 (CHF, HTN, age x 2, stroke x 2)  3. Chronic systolic CHF: -He does not appear volume overloaded at this time -He is not on a beta blocker given intolerance in the past -No indication for diuresis at this time  4. HTN: -Well controlled -Continue current regimen as above  Lunette Tapp M. Lanina Larranaga MD 12/06/2015 7:05 AM

## 2015-12-06 NOTE — Progress Notes (Signed)
Patient still with complaints of constipation, Dulcolax given as ordered for constipation will monitor patient. Diedre Maclellan, Bettina Gavia RN

## 2015-12-07 ENCOUNTER — Ambulatory Visit (HOSPITAL_COMMUNITY): Admission: RE | Admit: 2015-12-07 | Payer: Medicare Other | Source: Ambulatory Visit | Admitting: Cardiology

## 2015-12-07 ENCOUNTER — Encounter (HOSPITAL_COMMUNITY)
Admission: AD | Disposition: A | Discharge status: Home / Self Care | Payer: Self-pay | Source: Other Acute Inpatient Hospital | Attending: Cardiology

## 2015-12-07 DIAGNOSIS — I472 Ventricular tachycardia: Secondary | ICD-10-CM

## 2015-12-07 HISTORY — PX: ELECTROPHYSIOLOGIC STUDY: SHX172A

## 2015-12-07 LAB — POCT ACTIVATED CLOTTING TIME
ACTIVATED CLOTTING TIME: 286 s
ACTIVATED CLOTTING TIME: 307 s
ACTIVATED CLOTTING TIME: 204 s
ACTIVATED CLOTTING TIME: 178 s
Activated Clotting Time: 229 seconds

## 2015-12-07 SURGERY — V TACH ABLATION

## 2015-12-07 MED ORDER — SODIUM CHLORIDE 0.9 % IV SOLN: 250.0000 mL | INTRAVENOUS | Status: DC | PRN

## 2015-12-07 MED ORDER — ACETAMINOPHEN 325 MG PO TABS: 650.0000 mg | ORAL_TABLET | ORAL | Status: DC | PRN

## 2015-12-07 MED ORDER — ONDANSETRON HCL 4 MG/2ML IJ SOLN: 4.0000 mg | Freq: Four times a day (QID) | INTRAMUSCULAR | Status: DC | PRN

## 2015-12-07 MED ORDER — SODIUM CHLORIDE 0.9% FLUSH: 3.0000 mL | INTRAVENOUS | Status: DC | PRN

## 2015-12-07 MED ORDER — HEPARIN (PORCINE) IN NACL 2-0.9 UNIT/ML-% IJ SOLN
INTRAMUSCULAR | Status: DC | PRN
  Administered 2015-12-07: 14:00:00

## 2015-12-07 MED ORDER — SODIUM CHLORIDE 0.9% FLUSH
3.0000 mL | Freq: Two times a day (BID) | INTRAVENOUS | Status: DC
  Administered 2015-12-07 – 2015-12-08 (×2): 3 mL via INTRAVENOUS

## 2015-12-07 MED ORDER — FENTANYL CITRATE (PF) 100 MCG/2ML IJ SOLN
INTRAMUSCULAR | Status: DC | PRN
  Administered 2015-12-07 (×2): 25 ug via INTRAVENOUS

## 2015-12-07 MED ORDER — HEPARIN SODIUM (PORCINE) 1000 UNIT/ML IJ SOLN
INTRAMUSCULAR | Status: DC | PRN
  Administered 2015-12-07: 11000 [IU] via INTRAVENOUS
  Administered 2015-12-07: 3000 [IU] via INTRAVENOUS

## 2015-12-07 MED ORDER — MIDAZOLAM HCL 5 MG/5ML IJ SOLN
INTRAMUSCULAR | Status: DC | PRN
  Administered 2015-12-07 (×2): 1 mg via INTRAVENOUS

## 2015-12-07 MED ORDER — BUPIVACAINE HCL (PF) 0.25 % IJ SOLN
INTRAMUSCULAR | Status: DC | PRN
  Administered 2015-12-07: 45 mL

## 2015-12-07 SURGICAL SUPPLY — 20 items
BAG SNAP BAND KOVER 36X36 (MISCELLANEOUS) ×3 IMPLANT
CATH JOSEPHSON QUAD-ALLRED 6FR (CATHETERS) ×3 IMPLANT
CATH MAPPNG PENTARAY F 2-6-2MM (CATHETERS) ×1 IMPLANT
CATH SMTCH THERMOCOOL SF DF (CATHETERS) ×3 IMPLANT
CATH SOUNDSTAR 3D IMAGING (CATHETERS) ×3 IMPLANT
COVER SWIFTLINK CONNECTOR (BAG) ×3 IMPLANT
INTRO AGILIS NXT STEERABLE LRG (INTRODUCER) ×3
INTRODUCER AGILS NXT STRBL LRG (INTRODUCER) ×1 IMPLANT
NEEDLE TRANSSEPTAL BRK 98CM (NEEDLE) ×3 IMPLANT
PACK EP LATEX FREE (CUSTOM PROCEDURE TRAY) ×2
PACK EP LF (CUSTOM PROCEDURE TRAY) ×1 IMPLANT
PAD DEFIB LIFELINK (PAD) ×3 IMPLANT
PATCH CARTO3 (PAD) ×3 IMPLANT
PENTARAY F 2-6-2MM (CATHETERS) ×3
SHEATH AVANTI 11F 11CM (SHEATH) ×3 IMPLANT
SHEATH PINNACLE 6F 10CM (SHEATH) ×3 IMPLANT
SHEATH PINNACLE 8F 10CM (SHEATH) ×3 IMPLANT
SHEATH PINNACLE VASC 9FR (SHEATH) ×3 IMPLANT
SHIELD RADPAD SCOOP 12X17 (MISCELLANEOUS) ×3 IMPLANT
TUBING SMART ABLATE COOLFLOW (TUBING) ×3 IMPLANT

## 2015-12-07 NOTE — Progress Notes (Signed)
Site area: RFV x 1/ LFV x 2 Site Prior to Removal:  Level  Pressure Applied For:20 min Manual: yes   Patient Status During Pull:stable   Post Pull Site:  Level Post Pull Instructions Given: Yes  Post Pull Pulses Present:  Dressing Applied:  tegaderm Bedrest begins @ 1930 till 0100 Comments:Rodney Cox/canderson

## 2015-12-07 NOTE — Progress Notes (Signed)
SUBJECTIVE: The patient is doing well today.  At this time, he denies chest pain, shortness of breath, or any new concerns.     Marland Kitchen atorvastatin  10 mg Oral q1800  . cholecalciferol  1,000 Units Oral Daily  . finasteride  5 mg Oral Daily  . multivitamin with minerals  1 tablet Oral Daily  . tamsulosin  0.8 mg Oral Daily  . verapamil  120 mg Oral QHS  . verapamil  5 mg Intravenous Once  . vitamin C  500 mg Oral Daily      OBJECTIVE: Physical Exam: Filed Vitals:   12/06/15 1318 12/06/15 1944 12/06/15 2235 12/07/15 0453  BP: 100/50 113/65 118/57 117/66  Pulse: 55 55 56 62  Temp: 98.8 F (37.1 C) 97.6 F (36.4 C)  97.9 F (36.6 C)  TempSrc: Oral Oral  Oral  Resp: 18 18  16   Height:      Weight:      SpO2: 97% 99%  98%    Intake/Output Summary (Last 24 hours) at 12/07/15 0941 Last data filed at 12/06/15 1800  Gross per 24 hour  Intake    600 ml  Output      1 ml  Net    599 ml    Telemetry reveals SB/SR, transiently high 30's-40's overnight, 50-70's daytime, no VT in the last 48 hours  GEN- The patient is well appearing, alert and oriented x 3 today.   Head- normocephalic, atraumatic Eyes-  Sclera clear, conjunctiva pink Ears- hearing intact Oropharynx- clear Neck- supple, no JVP Lungs- Clear to ausculation bilaterally, normal work of breathing Heart- Regular rate and rhythm, no significant murmurs, no rubs or gallops GI- soft, NT, ND Extremities- no clubbing, cyanosis, or edema Skin- no rash or lesion Psych- euthymic mood, full affect Neuro- no gross deficits appreciated  12/05/15: Echocardiogram Study Conclusions - Left ventricle: The cavity size was mildly dilated. Systolic  function was mildly to moderately reduced. The estimated ejection  fraction was in the range of 40% to 45% with hypokinesis worse at  the apex. - Aortic valve: There was mild regurgitation. - Mitral valve: There was mild regurgitation. - Left atrium: The atrium was mildly  dilated. - Pulmonary arteries: PA peak pressure: 52 mm Hg (S).    ASSESSMENT AND PLAN:  1. VT storm: -Currently in normal sinus rhythm  -Mexiletine held yesterday for procedure today -Has remained hemodynamically stable throughout his episodes of VT, should he become unstable would need DCCV - repeat TTE EF 40-45%, hypokinesis worse at the apex, PA 4mmHg - MRI shows inferolateral basal scar and EF 43% -Plan for VT ablation tomorrow   2. PAF: -remains in SR -On verapamil for rate control -Eliquis 5 mg bid for full-dose anticoagulation, held yesterday for his VT ablation today -CHADS2VASc at least 6 (CHF, HTN, age x 2, stroke x 2)  3. Chronic systolic CHF: -He does not appear volume overloaded at this time -He is not on a beta blocker given intolerance in the past -No indication for diuresis at this time  4. HTN: -Well controlled -Continue current regimen as above  Tommye Standard, PA-C  12/07/2015 9:41 AM  I have seen and examined this patient with Tommye Standard.  Agree with above, note added to reflect my findings.  On exam, regular rhythm, no murmurs, lungs clear.  No further VT since Saturday.  Have held his mexilitine and eliquis for VT ablation today.    Will M. Camnitz MD 12/07/2015 9:47 AM

## 2015-12-08 ENCOUNTER — Encounter (HOSPITAL_COMMUNITY): Payer: Self-pay | Admitting: Cardiology

## 2015-12-08 MED ORDER — APIXABAN 5 MG PO TABS
5.0000 mg | ORAL_TABLET | Freq: Two times a day (BID) | ORAL | Status: DC
  Administered 2015-12-08: 5 mg via ORAL
  Filled 2015-12-08: qty 1

## 2015-12-08 MED ORDER — MEXILETINE HCL 200 MG PO CAPS: 200.0000 mg | ORAL_CAPSULE | Freq: Two times a day (BID) | ORAL | Status: DC

## 2015-12-08 MED ORDER — MEXILETINE HCL 200 MG PO CAPS
200.0000 mg | ORAL_CAPSULE | Freq: Two times a day (BID) | ORAL | Status: DC
  Administered 2015-12-08: 200 mg via ORAL
  Filled 2015-12-08: qty 1

## 2015-12-08 MED ORDER — ATROPINE SULFATE 1 MG/10ML IJ SOSY
PREFILLED_SYRINGE | INTRAMUSCULAR | Status: AC
  Filled 2015-12-08: qty 10

## 2015-12-08 MED FILL — Heparin Sodium (Porcine) 2 Unit/ML in Sodium Chloride 0.9%: INTRAMUSCULAR | Qty: 500 | Status: AC

## 2015-12-08 NOTE — Progress Notes (Signed)
Pt discharged home, discharge instructions and medications review with spouse. All questions answered.

## 2015-12-08 NOTE — Progress Notes (Signed)
Paged Dr. Tommi Rumps regarding pt's heart rate 38-40s. MD aware. Pt asymptomatic and history of this during hospitalization. Will continue to monitor closely.

## 2015-12-08 NOTE — Discharge Instructions (Addendum)
No driving for 1 week. No lifting over 5 lbs for 1 week. No sexual or vigorous activity for 1 week. You may return to work on 12/14/15. Keep procedure site clean & dry. If you notice increased pain, swelling, bleeding or pus, call/return!  You may shower, but no soaking baths/hot tubs/pools for 1 week.   _____________________________________________________________________________________________________________________________________ Information on my medicine - ELIQUIS (apixaban)  This medication education was reviewed with me or my healthcare representative as part of my discharge preparation.  The pharmacist that spoke with me during my hospital stay was:  Myrene Galas, [facility]  Why was Eliquis prescribed for you? Eliquis was prescribed for you to reduce the risk of a blood clot forming that can cause a stroke if you have a medical condition called atrial fibrillation (a type of irregular heartbeat).  What do You need to know about Eliquis ? Take your Eliquis TWICE DAILY - one tablet in the morning and one tablet in the evening with or without food. If you have difficulty swallowing the tablet whole please discuss with your pharmacist how to take the medication safely.  Take Eliquis exactly as prescribed by your doctor and DO NOT stop taking Eliquis without talking to the doctor who prescribed the medication.  Stopping may increase your risk of developing a stroke.  Refill your prescription before you run out.  After discharge, you should have regular check-up appointments with your healthcare provider that is prescribing your Eliquis.  In the future your dose may need to be changed if your kidney function or weight changes by a significant amount or as you get older.  What do you do if you miss a dose? If you miss a dose, take it as soon as you remember on the same day and resume taking twice daily.  Do not take more than one dose of ELIQUIS at the same time to make up a missed  dose.  Important Safety Information A possible side effect of Eliquis is bleeding. You should call your healthcare provider right away if you experience any of the following: ? Bleeding from an injury or your nose that does not stop. ? Unusual colored urine (red or dark brown) or unusual colored stools (red or black). ? Unusual bruising for unknown reasons. ? A serious fall or if you hit your head (even if there is no bleeding).  Some medicines may interact with Eliquis and might increase your risk of bleeding or clotting while on Eliquis. To help avoid this, consult your healthcare provider or pharmacist prior to using any new prescription or non-prescription medications, including herbals, vitamins, non-steroidal anti-inflammatory drugs (NSAIDs) and supplements.  This website has more information on Eliquis (apixaban): http://www.eliquis.com/eliquis/home

## 2015-12-09 ENCOUNTER — Ambulatory Visit: Payer: Medicare Other | Admitting: Nurse Practitioner

## 2015-12-09 ENCOUNTER — Encounter: Payer: Self-pay | Admitting: *Deleted

## 2015-12-13 ENCOUNTER — Other Ambulatory Visit: Payer: Self-pay | Admitting: Internal Medicine

## 2015-12-14 ENCOUNTER — Ambulatory Visit (INDEPENDENT_AMBULATORY_CARE_PROVIDER_SITE_OTHER): Payer: Medicare Other | Admitting: Urology

## 2015-12-14 ENCOUNTER — Encounter: Payer: Self-pay | Admitting: Urology

## 2015-12-14 VITALS — BP 136/76 | HR 69 | Ht 67.0 in | Wt 167.7 lb

## 2015-12-14 DIAGNOSIS — Z87898 Personal history of other specified conditions: Secondary | ICD-10-CM

## 2015-12-14 DIAGNOSIS — N401 Enlarged prostate with lower urinary tract symptoms: Secondary | ICD-10-CM

## 2015-12-14 DIAGNOSIS — Z87448 Personal history of other diseases of urinary system: Secondary | ICD-10-CM

## 2015-12-14 DIAGNOSIS — Q6100 Congenital renal cyst, unspecified: Secondary | ICD-10-CM | POA: Diagnosis not present

## 2015-12-14 DIAGNOSIS — N138 Other obstructive and reflux uropathy: Secondary | ICD-10-CM

## 2015-12-14 DIAGNOSIS — N281 Cyst of kidney, acquired: Secondary | ICD-10-CM

## 2015-12-14 LAB — URINALYSIS, COMPLETE
Bilirubin, UA: NEGATIVE
Glucose, UA: NEGATIVE
Ketones, UA: NEGATIVE
Nitrite, UA: NEGATIVE
PH UA: 6.5 (ref 5.0–7.5)
PROTEIN UA: NEGATIVE
Specific Gravity, UA: 1.01 (ref 1.005–1.030)
Urobilinogen, Ur: 0.2 mg/dL (ref 0.2–1.0)

## 2015-12-14 LAB — MICROSCOPIC EXAMINATION
BACTERIA UA: NONE SEEN
WBC, UA: >30 /hpf — AB (ref 0–?)

## 2015-12-14 MED ORDER — TAMSULOSIN HCL 0.4 MG PO CAPS: 0.4000 mg | ORAL_CAPSULE | Freq: Two times a day (BID) | ORAL | Status: DC

## 2015-12-14 MED ORDER — FINASTERIDE 5 MG PO TABS: 5.0000 mg | ORAL_TABLET | Freq: Every day | ORAL | Status: DC

## 2015-12-14 NOTE — Progress Notes (Signed)
12/14/2015 7:47 AM   Austin Warren 12/24/1940 [id number]  Referring provider: Kirk Ruths, MD [place] [facility] [place], [place] [address]  Chief Complaint  Patient presents with  . Elevated PSA    1 year follow up  . Benign Prostatic Hypertrophy    HPI: Patient is a 75 year old Caucasian male with a history of elevated PSA, history of microscopic hematuria, Bosniak 2 left lower pole cyst and BPH with LUTS who presents today for a 1 year follow-up.  History of elevated PSA Patient underwent a biopsy of his prostate in 2005 for a PSA elevation of 5.5.  The results were benign.  He was initiated on finasteride 5 mg daily. His most recent PSA was 0.9 on 12/10/2014.  Due to his age and AUA guidelines, it was decided to discontinue PSA screenings as his last several PSAs have returned below 3.  We will continue annual rectal exams and if an abnormal finding is discovered, we will have further discussions at that time.  Microscopic hematuria Patient underwent a hematuria workup in 2016 and was found to have an enlarged prostate and a Bosniak 2 left lower pole cyst.  He does not report any episodes of gross hematuria over the last year.  BPH WITH LUTS His IPSS score today is 2, which is mild lower urinary tract symptomatology. He is pleased with his quality life due to his urinary symptoms.  His previous IPSS score was 3/1.  His previous PVR is 199 mL.  His major complaint today is nocturia x 2.  He has had these symptoms for several years.  He denies any dysuria, hematuria or suprapubic pain.  He currently taking tamsulosin 0.4 mg daily and finasteride 5 mg daily.  His has had cystoscopy on 05/03/2015 and found to have an enlarged prostate, estimated size greater than 100 g, significant trabeculation and left small Hutch diverticuli without masses.  He also denies any recent fevers, chills, nausea or vomiting.  He does not have a family history of PCa.      IPSS      12/14/15 1000       International Prostate Symptom Score   How often have you had the sensation of not emptying your bladder? Not at All     How often have you had to urinate less than every two hours? Not at All     How often have you found you stopped and started again several times when you urinated? Not at All     How often have you found it difficult to postpone urination? Not at All     How often have you had a weak urinary stream? Not at All     How often have you had to strain to start urination? Not at All     How many times did you typically get up at night to urinate? 2 Times     Total IPSS Score 2     Quality of Life due to urinary symptoms   If you were to spend the rest of your life with your urinary condition just the way it is now how would you feel about that? Pleased        Score:  1-7 Mild 8-19 Moderate 20-35 Severe    PMH: Past Medical History  Diagnosis Date  . Hypertension   . Hyperlipidemia   . History of colon polyps   . Benign prostatic hypertrophy   . Arthritis   .  Ventricular tachycardia (HCC)     RBB/LAHB ideopathic VT, 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  . Osteoarthritis   . Dyspnea on exertion 11/01/2011  . Stroke ([place]) 05/2014  . UTI (urinary tract infection)   . Melanoma ([place])     under arm  . Retroperitoneal mass   . Elevated PSA   . Urinary retention   . CHF (congestive heart failure) ([address] [place])   . History of colon polyps   . Diabetes ([place])   . TIA (transient ischemic attack)   . Microscopic hematuria     Surgical History: Past Surgical History  Procedure Laterality Date  . Replacement total knee    . Vasectomy    . Joint replacement      R TKR  . Replacement total knee Left   . Radiology with anesthesia N/A 05/18/2014    Procedure: RADIOLOGY WITH ANESTHESIA;  Surgeon: Rob Hickman, MD;  Location: [place];  Service: Radiology;  Laterality: N/A;  . Cardioversion    . Colonoscopy with propofol N/A 10/17/2015     Procedure: COLONOSCOPY WITH PROPOFOL;  Surgeon: Manya Silvas, MD;  Location[facility] ENDOSCOPY;  Service: Endoscopy;  Laterality: N/A;  . Electrophysiologic study N/A 12/07/2015    Procedure: V Tach Ablation;  Surgeon: Will Laforest Leeds, MD;  Location: [place] CV LAB;  Service: Cardiovascular;  Laterality: N/A;    Home Medications:    Medication List       This list is accurate as of: 12/14/15 11:59 PM.  Always use your most recent med list.               apixaban 5 MG Tabs tablet  Commonly known as:  ELIQUIS  Take 1 tablet (5 mg total) by mouth 2 (two) times daily.     atorvastatin 10 MG tablet  Commonly known as:  LIPITOR  Take 10 mg by mouth daily.     finasteride 5 MG tablet  Commonly known as:  PROSCAR  Take 1 tablet (5 mg total) by mouth daily.     mexiletine 200 MG capsule  Commonly known as:  MEXITIL  Take 1 capsule (200 mg total) by mouth 2 (two) times daily.     multivitamin with minerals Tabs tablet  Take 1 tablet by mouth daily. One A Day 50+     PROAIR HFA 108 (90 Base) MCG/ACT inhaler  Generic drug:  albuterol  Inhale into the lungs. Reported on 12/14/2015     tamsulosin 0.4 MG Caps capsule  Commonly known as:  FLOMAX  Take 1 capsule (0.4 mg total) by mouth 2 (two) times daily.     torsemide 10 MG tablet  Commonly known as:  DEMADEX  Take 10 mg by mouth daily.     verapamil 120 MG CR tablet  Commonly known as:  CALAN-SR  TAKE ONE TABLET AT BEDTIME     vitamin C 500 MG tablet  Commonly known as:  ASCORBIC ACID  Take 500 mg by mouth daily.     Vitamin D3 1000 units Caps  Take 1,000 Units by mouth daily.        Allergies:  Allergies  Allergen Reactions  . Ciprofloxacin Anaphylaxis  . Levofloxacin Anaphylaxis    "throat closes"  . Enalapril Maleate Swelling    Angioedema  . Metoprolol Swelling    edema  . Vasotec [Enalaprilat] Other (See Comments)    Reaction: Angioedema  . Sulfa Antibiotics Rash    Family History: Family  History  Problem Relation Age  of Onset  . Stroke Father   . Heart attack Neg Hx   . Hypertension Father   . Kidney disease Neg Hx   . Prostate cancer Neg Hx     Social History:  reports that he quit smoking about 45 years ago. His smoking use included Cigarettes. He started smoking about 55 years ago. He has a 20 pack-year smoking history. He has never used smokeless tobacco. He reports that he drinks alcohol. He reports that he does not use illicit drugs.  ROS: UROLOGY Frequent Urination?: No Hard to postpone urination?: No Burning/pain with urination?: No Get up at night to urinate?: Yes Leakage of urine?: No Urine stream starts and stops?: No Trouble starting stream?: No Do you have to strain to urinate?: No Blood in urine?: No Urinary tract infection?: No Sexually transmitted disease?: No Injury to kidneys or bladder?: No Painful intercourse?: No Weak stream?: No Erection problems?: No Penile pain?: No  Gastrointestinal Nausea?: No Vomiting?: No Indigestion/heartburn?: No Diarrhea?: No Constipation?: No  Constitutional Fever: No Night sweats?: No Weight loss?: No Fatigue?: No  Skin Skin rash/lesions?: No Itching?: No  Eyes Blurred vision?: No Double vision?: No  Ears/Nose/Throat Sore throat?: No Sinus problems?: No  Hematologic/Lymphatic Swollen glands?: No Easy bruising?: No  Cardiovascular Leg swelling?: No Chest pain?: No  Respiratory Cough?: No Shortness of breath?: No  Endocrine Excessive thirst?: No  Musculoskeletal Back pain?: No Joint pain?: No  Neurological Headaches?: No Dizziness?: No  Psychologic Depression?: No Anxiety?: No  Physical Exam: BP 136/76 mmHg  Pulse 69  Ht 5\' 7"  (1.702 m)  Wt 167 lb 11.2 oz (76.068 kg)  BMI 26.26 kg/m2  Constitutional: Well nourished. Alert and oriented, No acute distress. HEENT: [name] AT, moist mucus membranes. Trachea midline, no masses. Cardiovascular: No clubbing, cyanosis, or  edema. Respiratory: Normal respiratory effort, no increased work of breathing. GI: Abdomen is soft, non tender, non distended, no abdominal masses. Liver and spleen not palpable.  No hernias appreciated.  Stool sample for occult testing is not indicated.   GU: No CVA tenderness.  No bladder fullness or masses.  Patient with circumcised phallus.   Urethral meatus is patent.  No penile discharge. No penile lesions or rashes. Scrotum without lesions, cysts, rashes and/or edema.  Testicles are located scrotally bilaterally. No masses are appreciated in the testicles. Left and right epididymis are normal. Rectal: Patient with  normal sphincter tone. Anus and perineum without scarring or rashes. No rectal masses are appreciated. Prostate is approximately 65 grams, no nodules are appreciated. Seminal vesicles are normal. Skin: No rashes, bruises or suspicious lesions. Lymph: No cervical or inguinal adenopathy. Neurologic: Grossly intact, no focal deficits, moving all 4 extremities. Psychiatric: Normal mood and affect.  Laboratory Data: Lab Results  Component Value Date   WBC 7.7 12/02/2015   HGB 13.5 12/02/2015   HCT 41.1 12/02/2015   MCV 95.1 12/02/2015   PLT 99* 12/02/2015    Lab Results  Component Value Date   CREATININE 0.89 12/04/2015   PSA History:  5.5 ng/mL on 10/00/2005-bx was benign  1.3 ng/mL on 00/00/2013  1.4 ng/mL on 11/26/2012  1.6 ng/mL on 11/26/2013  0.9 ng/mL on 12/10/2014   Lab Results  Component Value Date   HGBA1C 6.3* 05/19/2014    Lab Results  Component Value Date   TSH 1.554 12/01/2015       Component Value Date/Time   CHOL 124 05/19/2014 0510   HDL 38* 05/19/2014 0510   CHOLHDL 3.3 05/19/2014  0510   VLDL 28 05/19/2014 0510   LDLCALC 58 05/19/2014 0510    Lab Results  Component Value Date   AST 66* 12/01/2015   Lab Results  Component Value Date   ALT 47 12/01/2015     Urinalysis Results for orders placed or performed in visit on 12/14/15    Microscopic Examination  Result Value Ref Range   WBC, UA >30 (A) 0 -  5 /hpf   RBC, UA 0-2 0 -  2 /hpf   Epithelial Cells (non renal) 0-10 0 - 10 /hpf   Bacteria, UA None seen None seen/Few  Urinalysis, Complete  Result Value Ref Range   Specific Gravity, UA 1.010 1.005 - 1.030   pH, UA 6.5 5.0 - 7.5   Color, UA Yellow Yellow   Appearance Ur Cloudy (A) Clear   Leukocytes, UA 3+ (A) Negative   Protein, UA Negative Negative/Trace   Glucose, UA Negative Negative   Ketones, UA Negative Negative   RBC, UA Trace (A) Negative   Bilirubin, UA Negative Negative   Urobilinogen, Ur 0.2 0.2 - 1.0 mg/dL   Nitrite, UA Negative Negative   Microscopic Examination See below:      Assessment & Plan:    1. History of elevated PSA:    PSA was found to be 5.5 and 2005. Biopsy at that time was benign. Recent PSAs have returned below 3. We have discontinued screening at this time.   2. History of hematuria:    Patient underwent hematuria workup in 2016. He was found to have BPH and Bosniak #2 cyst. He has not had any gross hematuria over the last year.   We will continue to monitor.   3. Bosniak #2 cyst:   Obtain RUS.  I will call patient with report.    4. BPH with LUTS:  -IPSS score is 2/1  -Continue tamsulosin 0.4 mg daily and finasteride 5 mg daily  -RTC in 12 months for IPSS score and exam   -Urinalysis, Complete   Return in about 1 year (around 12/13/2016) for IPSS and exam.  These notes generated with voice recognition software. I apologize for typographical errors.  Zara Council, [facility] [address] [place], [place] [address] [phone]

## 2015-12-18 DIAGNOSIS — N138 Other obstructive and reflux uropathy: Secondary | ICD-10-CM | POA: Insufficient documentation

## 2015-12-18 DIAGNOSIS — N401 Enlarged prostate with lower urinary tract symptoms: Principal | ICD-10-CM

## 2015-12-19 ENCOUNTER — Other Ambulatory Visit: Payer: Self-pay | Admitting: Internal Medicine

## 2015-12-29 ENCOUNTER — Ambulatory Visit: Payer: Medicare Other | Admitting: Internal Medicine

## 2016-01-02 ENCOUNTER — Ambulatory Visit
Admission: RE | Admit: 2016-01-02 | Discharge: 2016-01-02 | Disposition: A | Discharge status: Home / Self Care | Payer: Medicare Other | Source: Ambulatory Visit | Attending: Urology | Admitting: Urology

## 2016-01-02 DIAGNOSIS — N281 Cyst of kidney, acquired: Secondary | ICD-10-CM

## 2016-01-02 DIAGNOSIS — Q6100 Congenital renal cyst, unspecified: Secondary | ICD-10-CM | POA: Insufficient documentation

## 2016-01-02 DIAGNOSIS — N138 Other obstructive and reflux uropathy: Secondary | ICD-10-CM | POA: Insufficient documentation

## 2016-01-02 DIAGNOSIS — N401 Enlarged prostate with lower urinary tract symptoms: Secondary | ICD-10-CM | POA: Diagnosis not present

## 2016-01-03 ENCOUNTER — Ambulatory Visit: Payer: Medicare Other | Admitting: Internal Medicine

## 2016-01-03 ENCOUNTER — Ambulatory Visit: Payer: Medicare Other | Admitting: Cardiology

## 2016-01-10 ENCOUNTER — Emergency Department
Admission: EM | Admit: 2016-01-10 | Discharge: 2016-01-10 | Disposition: A | Discharge status: Home / Self Care | Payer: Medicare Other | Attending: Emergency Medicine | Admitting: Emergency Medicine

## 2016-01-10 ENCOUNTER — Telehealth: Payer: Self-pay | Admitting: Internal Medicine

## 2016-01-10 ENCOUNTER — Other Ambulatory Visit: Payer: Self-pay

## 2016-01-10 DIAGNOSIS — Z8673 Personal history of transient ischemic attack (TIA), and cerebral infarction without residual deficits: Secondary | ICD-10-CM | POA: Diagnosis not present

## 2016-01-10 DIAGNOSIS — I472 Ventricular tachycardia: Secondary | ICD-10-CM | POA: Diagnosis present

## 2016-01-10 DIAGNOSIS — I4729 Other ventricular tachycardia: Secondary | ICD-10-CM

## 2016-01-10 DIAGNOSIS — E785 Hyperlipidemia, unspecified: Secondary | ICD-10-CM | POA: Insufficient documentation

## 2016-01-10 DIAGNOSIS — E119 Type 2 diabetes mellitus without complications: Secondary | ICD-10-CM | POA: Diagnosis not present

## 2016-01-10 DIAGNOSIS — M1990 Unspecified osteoarthritis, unspecified site: Secondary | ICD-10-CM | POA: Insufficient documentation

## 2016-01-10 DIAGNOSIS — Z87891 Personal history of nicotine dependence: Secondary | ICD-10-CM | POA: Insufficient documentation

## 2016-01-10 DIAGNOSIS — I482 Chronic atrial fibrillation: Secondary | ICD-10-CM | POA: Diagnosis not present

## 2016-01-10 DIAGNOSIS — Z79899 Other long term (current) drug therapy: Secondary | ICD-10-CM | POA: Insufficient documentation

## 2016-01-10 DIAGNOSIS — I509 Heart failure, unspecified: Secondary | ICD-10-CM | POA: Insufficient documentation

## 2016-01-10 DIAGNOSIS — I110 Hypertensive heart disease with heart failure: Secondary | ICD-10-CM | POA: Insufficient documentation

## 2016-01-10 LAB — BASIC METABOLIC PANEL
ANION GAP: 10 (ref 5–15)
BUN: 17 mg/dL (ref 6–20)
CHLORIDE: 100 mmol/L — AB (ref 101–111)
CO2: 28 mmol/L (ref 22–32)
Calcium: 9.5 mg/dL (ref 8.9–10.3)
Creatinine, Ser: 0.94 mg/dL (ref 0.61–1.24)
GFR calc non Af Amer: >60 mL/min (ref >60)
Glucose, Bld: 96 mg/dL (ref 65–99)
POTASSIUM: 3.9 mmol/L (ref 3.5–5.1)
SODIUM: 138 mmol/L (ref 135–145)

## 2016-01-10 LAB — TROPONIN I: Troponin I: <0.03 ng/mL (ref <0.03)

## 2016-01-10 LAB — CBC WITH DIFFERENTIAL/PLATELET
Basophils Absolute: 0 10*3/uL (ref 0–0.1)
Basophils Relative: 1 %
EOS ABS: 0.2 10*3/uL (ref 0–0.7)
Eosinophils Relative: 3 %
HCT: 44.1 % (ref 40.0–52.0)
HEMOGLOBIN: 15.2 g/dL (ref 13.0–18.0)
LYMPHS ABS: 1.8 10*3/uL (ref 1.0–3.6)
Lymphocytes Relative: 29 %
MCH: 32.3 pg (ref 26.0–34.0)
MCHC: 34.5 g/dL (ref 32.0–36.0)
MCV: 93.4 fL (ref 80.0–100.0)
MONOS PCT: 10 %
Monocytes Absolute: 0.6 10*3/uL (ref 0.2–1.0)
NEUTROS PCT: 57 %
Neutro Abs: 3.5 10*3/uL (ref 1.4–6.5)
Platelets: 107 10*3/uL — ABNORMAL LOW (ref 150–440)
RBC: 4.72 MIL/uL (ref 4.40–5.90)
RDW: 14.3 % (ref 11.5–14.5)
WBC: 6.1 10*3/uL (ref 3.8–10.6)

## 2016-01-10 LAB — MAGNESIUM: MAGNESIUM: 1.8 mg/dL (ref 1.7–2.4)

## 2016-01-10 NOTE — ED Notes (Signed)
Pt presents with SVT, HR 202 per Pt, currently 101 in Rm. MD at bedsde

## 2016-01-10 NOTE — Telephone Encounter (Signed)
Called by Dr. Reita Cliche at the [facility] ER for Austin Warren who was admitted for stable VT with rate around 200 - on admission, spontaneously converted back to sinus. Vitals stable and he feels well. Discussed with Dr. Curt Bears, agreed that he was safe to discharge home from the ER with early follow-up with Dr. Caryl Comes this week in the office (since Dr. Curt Bears is out of the office). If he has recurrent VT, he should proceed to the nearest ER and we will gladly accept him here in transfer for observation and medication management.  Pixie Casino, MD, [facility] Attending Cardiologist [place]

## 2016-01-10 NOTE — Discharge Instructions (Signed)
You were evaluated after an episode of ventricular tachycardia, which did stop on its own. After discussion with your cardiologist team, it was recommended to go ahead and discharge him home tonight.  You do need to see the cardiologist, electrophysiologist team tomorrow. He will be called by the office, but you may call the office in the morning to find out when the appointment will be.  Return to the emergency department immediately for any recurrent symptoms of the episode today, including fast heartbeat, certainly any chest pain, dizziness, confusion, or passing out.

## 2016-01-10 NOTE — ED Provider Notes (Signed)
[facility] Emergency Department Provider Note   ____________________________________________  Time seen: Immediately upon arrival I have reviewed the triage vital signs and the triage nursing note.  HISTORY  Chief Complaint Tachycardia   Historian Patient and spouse  HPI Austin Warren is a 75 y.o. male with a history of ventricular tachycardia, for which he takes verapamil for rate control and recently approximately one month ago was started on mexiletine after a hospital stay for which he had cardiology and electrophysiology evaluation for episodes of stable ventricular tachycardia and attempts at ablation, but no source site was found.  Since discharge from [facility] he has not had any additional episodes of ventricular tachycardia symptomatology, until this evening just prior to arrival patient felt heart racing and his monitor at home said his heart rate was around 200.  He denies chest pain or trouble breathing, confusion, dizziness.  They had a discussionabout ICD, and had decided against it due to the fact that he's been stable never having any altered mental status, hypotension, or chest discomfort.    Past Medical History  Diagnosis Date  . Hypertension   . Hyperlipidemia   . History of colon polyps   . Benign prostatic hypertrophy   . Arthritis   . Ventricular tachycardia (HCC)     RBB/LAHB ideopathic VT, 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  . Osteoarthritis   . Dyspnea on exertion 11/01/2011  . Stroke ([place]) 05/2014  . UTI (urinary tract infection)   . Melanoma ([place])     under arm  . Retroperitoneal mass   . Elevated PSA   . Urinary retention   . CHF (congestive heart failure) ([place])   . History of colon polyps   . Diabetes ([place])   . TIA (transient ischemic attack)   . Microscopic hematuria     Patient Active Problem List   Diagnosis Date Noted  . BPH with obstruction/lower urinary tract symptoms 12/18/2015  . Ventricular  tachyarrhythmia ([place]) 12/01/2015  . VT (ventricular tachycardia) ([place]) 12/01/2015  . Arterial hypotension   . Urine retention   . Malaise and fatigue   . Encounter for general adult medical examination without abnormal findings 03/10/2015  . Arthritis 03/07/2015  . Microscopic hematuria 02/14/2015  . Bilateral renal cysts 02/14/2015  . 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 Amyloidosis ([place])   . Type 2 diabetes mellitus with other circulatory complications ([place]) [id number]  . HLD (hyperlipidemia) 07/02/2014  . Ischemic stroke ([place]) 06/21/2014  . Atrial fibrillation, chronic ([place]) 06/19/2014  . 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 Stroke ([place])   . Atrial fibrillation ([place]) 05/18/2014  . CVA (cerebral vascular accident) ([place]) 05/18/2014  . Essential hypertension 05/18/2014  . Diabetes mellitus ([place]) 05/18/2014  . A-fib ([place]) 05/18/2014  . Cerebral vascular accident ([place]) 05/18/2014  . Cerebrovascular accident (CVA) ([place]) 05/18/2014  . Acute ischemic stroke ([place])   . Benign prostatic hyperplasia with urinary obstruction 12/19/2013  . Diabetes ([place]) 12/19/2013  . Type 2 diabetes mellitus with other specified complication ([place]) [id number]  . Retroperitoneal mass 12/19/2013  . Type 2 diabetes mellitus ([place]) 12/19/2013  . low-voltage ECG 03/05/2013  . Hypersomnolence 03/05/2013  . Morbid obesity ([place]) 03/05/2013  . Obesity (BMI 30-39.9) 03/05/2013  . Difficulty staying awake 03/05/2013  . Abnormal prostate specific antigen 11/28/2012  . Elevated prostate specific antigen (PSA) 11/28/2012  . Dyspnea on exertion 11/01/2011  . Edema 11/01/2011  . Ventricular tachycardia ([place]) 12/01/2010  . Abnormal  signal averaged  electrocardiogram 12/01/2010  . Idiopathic ventricular tachycardia ([place]) 12/01/2010    Past Surgical History  Procedure Laterality Date  . Replacement total knee    . Vasectomy    . Joint replacement      R TKR  . Replacement total knee  Left   . Radiology with anesthesia N/A 05/18/2014    Procedure: RADIOLOGY WITH ANESTHESIA;  Surgeon: Rob Hickman, MD;  Location: [place];  Service: Radiology;  Laterality: N/A;  . Cardioversion    . Colonoscopy with propofol N/A 10/17/2015    Procedure: COLONOSCOPY WITH PROPOFOL;  Surgeon: Manya Silvas, MD;  Location: [facility] ENDOSCOPY;  Service: Endoscopy;  Laterality: N/A;  . Electrophysiologic study N/A 12/07/2015    Procedure: V Tach Ablation;  Surgeon: Will Beazer Leeds, MD;  Location: [place] CV LAB;  Service: Cardiovascular;  Laterality: N/A;    Current Outpatient Rx  Name  Route  Sig  Dispense  Refill  . albuterol (PROAIR HFA) 108 (90 Base) MCG/ACT inhaler   Inhalation   Inhale into the lungs. Reported on 12/14/2015         . Ascorbic Acid (VITAMIN C) 500 MG tablet   Oral   Take 500 mg by mouth daily.           Marland Kitchen atorvastatin (LIPITOR) 10 MG tablet   Oral   Take 10 mg by mouth daily.          . Cholecalciferol (VITAMIN D3) 1000 UNITS CAPS   Oral   Take 1,000 Units by mouth daily.          Marland Kitchen ELIQUIS 5 MG TABS tablet      TAKE ONE TABLET TWICE A DAY   60 tablet   3   . finasteride (PROSCAR) 5 MG tablet   Oral   Take 1 tablet (5 mg total) by mouth daily.   90 tablet   3   . mexiletine (MEXITIL) 200 MG capsule   Oral   Take 1 capsule (200 mg total) by mouth 2 (two) times daily.   60 capsule   3   . Multiple Vitamin (MULTIVITAMIN WITH MINERALS) TABS tablet   Oral   Take 1 tablet by mouth daily. One A Day 50+         . tamsulosin (FLOMAX) 0.4 MG CAPS capsule   Oral   Take 1 capsule (0.4 mg total) by mouth 2 (two) times daily.   180 capsule   3   . torsemide (DEMADEX) 10 MG tablet   Oral   Take 10 mg by mouth daily.          . verapamil (CALAN-SR) 120 MG CR tablet      TAKE ONE TABLET AT BEDTIME   30 tablet   6     Allergies Ciprofloxacin; Levofloxacin; Enalapril maleate; Metoprolol; Vasotec; and Sulfa antibiotics  Family  History  Problem Relation Age of Onset  . Stroke Father   . Heart attack Neg Hx   . Hypertension Father   . Kidney disease Neg Hx   . Prostate cancer Neg Hx     Social History Social History  Substance Use Topics  . Smoking status: Former Smoker -- 2.00 packs/day for 10 years    Types: Cigarettes    Start date: 02/10/1960    Quit date: 02/09/1970  . Smokeless tobacco: Never Used     Comment: 05/2014    quit smoking  45 years ago  . Alcohol Use: Yes  Comment: occasionally    Review of Systems  Constitutional: Negative for fever. Eyes: Negative for visual changes. ENT: Negative for sore throat. Cardiovascular: Negative for chest pain. Respiratory: Negative for shortness of breath. Gastrointestinal: Negative for abdominal pain, vomiting and diarrhea. Genitourinary: Negative for dysuria. Musculoskeletal: Negative for back pain. Skin: Negative for rash. Neurological: Negative for headache. 10 point Review of Systems otherwise negative ____________________________________________   PHYSICAL EXAM:  VITAL SIGNS: ED Triage Vitals  Enc Vitals Group     BP 01/10/16 1833 130/84 mmHg     Pulse Rate 01/10/16 1833 98     Resp 01/10/16 1833 16     Temp 01/10/16 1831 98.9 F (37.2 C)     Temp Source 01/10/16 1831 Oral     SpO2 01/10/16 1833 98 %     Weight --      Height --      Head Cir --      Peak Flow --      Pain Score --      Pain Loc --      Pain Edu? --      Excl. in [place]? --      Constitutional: Alert and oriented. Well appearing and in no distress. HEENT   Head: Normocephalic and atraumatic.      Eyes: Conjunctivae are normal. PERRL. Normal extraocular movements.      Ears:         Nose: No congestion/rhinnorhea.   Mouth/Throat: Mucous membranes are moist.   Neck: No stridor. Cardiovascular/Chest: Normal rate, regular rhythm.  No murmurs, rubs, or gallops. Respiratory: Normal respiratory effort without tachypnea nor retractions. Breath sounds  are clear and equal bilaterally. No wheezes/rales/rhonchi. Gastrointestinal: Soft. No distention, no guarding, no rebound. Nontender.    Genitourinary/rectal:Deferred Musculoskeletal: Nontender with normal range of motion in all extremities. No joint effusions.  No lower extremity tenderness.  No edema. Neurologic:  Normal speech and language. No gross or focal neurologic deficits are appreciated. Skin:  Skin is warm, dry and intact. No rash noted. Psychiatric: Mood and affect are normal. Speech and behavior are normal. Patient exhibits appropriate insight and judgment.  ____________________________________________   EKG I, Lisa Roca, MD, the attending physician have personally viewed and interpreted all ECGs.  196 bpm. Wide-complex tachycardia. Left axis deviation.  Similar to prior episodes of ventricular tachycardia  EKG #2 97 bpm. Normal sinus rhythm. No specific intraventricular conduction delay ____________________________________________  LABS (pertinent positives/negatives)  Labs Reviewed  CBC WITH DIFFERENTIAL/PLATELET - Abnormal; Notable for the following:    Platelets 107 (*)    All other components within normal limits  BASIC METABOLIC PANEL  TROPONIN I  MAGNESIUM    ____________________________________________  RADIOLOGY All Xrays were viewed by me. Imaging interpreted by Radiologist.  None __________________________________________  PROCEDURES  Procedure(s) performed: None  Critical Care performed: CRITICAL CARE Performed by: Lisa Roca   Total critical care time: 30 minutes  Critical care time was exclusive of separately billable procedures and treating other patients.  Critical care was necessary to treat or prevent imminent or life-threatening deterioration.  Critical care was time spent personally by me on the following activities: development of treatment plan with patient and/or surrogate as well as nursing, discussions with consultants,  evaluation of patient's response to treatment, examination of patient, obtaining history from patient or surrogate, ordering and performing treatments and interventions, ordering and review of laboratory studies, ordering and review of radiographic studies, pulse oximetry and re-evaluation of patient's condition.   ____________________________________________  ED COURSE / ASSESSMENT AND PLAN  Pertinent labs & imaging results that were available during my care of the patient were reviewed by me and considered in my medical decision making (see chart for details).  I was called immediately to the room with an EKG showing ventricular tachycardia. When I walked in the room the patient was being placed on monitors while IVs were being drawn. As he was hooked up he did spontaneously convert to normal sinus rhythm.  While patient was in ventricular tachycardia, normal mental status, no chest pain, no hypotension.  In reviewing the history with the patient and his chart review, he has had episodes of ventricular tachycardia, most recently in the hospital at [facility] and planned for ablation which a site was never found and so therefore never ablated. At that time he was on amiodarone but did get switched to mexiletine. Since being at home there is been no additional episodes.  I spoke with the cardiologist on-call at [facility] did discuss this case and further management, Dr. Debara Pickett who did also discuss with his partner, patients electrophysiologist Dr. Curt Bears by phone who recommended no need for hospital observation, admission or transfer or any additional medications as patients episode was self limited and stable.  They recommend discharge home with follow up in the EP office tomorrow -- patient will be called by the office.    Patient care transferred to Dr. Marcelene Butte at shift change 9 PM. Labs are pending. If no significant findings, patient may be discharged with my prepared instructions after I  have already spoken with the patient and his spouse and they are happy and comfortable with this plan.    CONSULTATIONS:   [facility] Cardiologist Dr. Debara Pickett, and his discussion also included partner Dr. Curt Bears.   Patient / Family / Caregiver informed of clinical course, medical decision-making process, and agree with plan.   I discussed return precautions, follow-up instructions, and discharged instructions with patient and/or family.   ___________________________________________   FINAL CLINICAL IMPRESSION(S) / ED DIAGNOSES   Final diagnoses:  Ventricular tachycardia (paroxysmal) ([place])              Note: This dictation was prepared with Dragon dictation. Any transcriptional errors that result from this process are unintentional   Lisa Roca, MD 01/10/16 2103

## 2016-01-10 NOTE — ED Notes (Signed)
This RN called lab to add-on magnesium

## 2016-01-12 ENCOUNTER — Encounter (INDEPENDENT_AMBULATORY_CARE_PROVIDER_SITE_OTHER): Payer: Self-pay

## 2016-01-12 ENCOUNTER — Encounter: Payer: Self-pay | Admitting: Internal Medicine

## 2016-01-12 ENCOUNTER — Ambulatory Visit (INDEPENDENT_AMBULATORY_CARE_PROVIDER_SITE_OTHER): Payer: Medicare Other | Admitting: Internal Medicine

## 2016-01-12 VITALS — BP 112/62 | HR 70 | Ht 67.0 in | Wt 169.5 lb

## 2016-01-12 DIAGNOSIS — I472 Ventricular tachycardia, unspecified: Secondary | ICD-10-CM

## 2016-01-12 DIAGNOSIS — I4891 Unspecified atrial fibrillation: Secondary | ICD-10-CM

## 2016-01-12 DIAGNOSIS — I480 Paroxysmal atrial fibrillation: Secondary | ICD-10-CM

## 2016-01-12 MED ORDER — AMIODARONE HCL 400 MG PO TABS: ORAL_TABLET | ORAL | Status: DC

## 2016-01-12 NOTE — Patient Instructions (Signed)
Medication Instructions: - Your physician has recommended you make the following change in your medication:  1) Stop verapamil when you start amiodarone 2) Start amiodarone 400 mg one tablet twice daily x 2 weeks, then     Decrease amiodarone to 400 mg once daily x 2 weeks  Labwork: - none  Procedures/Testing: - none  Follow-Up: - Your physician recommends that you schedule a follow-up appointment - Tuesday 02/21/16 at 9:15 am with Dr. Caryl Comes.  Any Additional Special Instructions Will Be Listed Below (If Applicable).     If you need a refill on your cardiac medications before your next appointment, please call your pharmacy.

## 2016-01-12 NOTE — Progress Notes (Signed)
Patient Care Team: Kirk Ruths, MD as PCP - General (Unknown Physician Specialty)   HPI  Austin Warren is a 75 y.o. male Seen in followup for a ventricular tachycardia with a right bundle superior axis relatively narrow QRS complex and positive concordance suggesting a septal origin and possible verapamil sensitivity. Catheterization had demonstrated normal coronary arteries and normal left ventricular function. ECG was notable for marked reduction in voltage   Signal average Electrocardiogram was markedly abnormal.  MRI 4/16>>There is nosignificant change from the prior study with persistent basal inferior and inferolateral late gadolinium enhancement slightly more pronounced on the current study and a small focal late gadolinium enhancement at the point of inferior RV attachment to the LV wall.  He also underwent fat pad biopsy>> neg  Echocardiogram 11/15 demonstrated left ventricular hypertrophy with an ejection fraction of 45-50% with hypokinesis of the inferior apical myocardium   We initially tried him on a beta blocker prescription. He had recurrent ventricular tachycardia. He was put on verapamil.  He has had no significant recurrences of ventricular tachycardia. More recently he had recurrent tachycardia and was started on mexiletine.  5/17 he was transferred to [facility] in [place] storm. Evaluation included an echo with EF of 40-45%. Cardiac MR demonstrated EF of 43% with inferolateral and apical hypokinesis with inferolateral basilar scar which was if anything not worse compared to prior MR 4/16.  He underwent EP testing. No scar was found and no ablation undertaken. Ventricular flutter was inducible. VT apparently was not. There have been discussions regarding ICD implantation. He elected to defer that decision He was discharged on mexiletine.  He was seen again 6/17 for recurrent ventricular tachycardia. ECG was reviewed. Right bundle superior axis at a cycle length of just under  300 ms  Having terminated spontaneously he was discharged for office follow-up.  He  had  stroke  11/15. He was found to be in atrial fibrillation. He was treated with TPA. Apixaban was initiated. He is tolerating this without evidence of bleeding.  He has intellectual consequences and aphasia  He underwent cardioversion from 10/22/2014.   He has had no recurrent atrial fibrillation of which he is aware.     The patient denies chest pain, shortness of breath, nocturnal dyspnea, orthopnea or peripheral edema.         Past Medical History  Diagnosis Date  . Hypertension   . Hyperlipidemia   . History of colon polyps   . Benign prostatic hypertrophy   . Arthritis   . Ventricular tachycardia (HCC)     RBB/LAHB ideopathic VT, 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  . Osteoarthritis   . Dyspnea on exertion 11/01/2011  . Stroke ([place]) 05/2014  . UTI (urinary tract infection)   . Melanoma ([place])     under arm  . Retroperitoneal mass   . Elevated PSA   . Urinary retention   . CHF (congestive heart failure) ([place])   . History of colon polyps   . Diabetes ([place])   . TIA (transient ischemic attack)   . Microscopic hematuria     Past Surgical History  Procedure Laterality Date  . Replacement total knee    . Vasectomy    . Joint replacement      R TKR  . Replacement total knee Left   . Radiology with anesthesia N/A 05/18/2014    Procedure: RADIOLOGY WITH ANESTHESIA;  Surgeon: Rob Hickman, MD;  Location: [facility];  Service: Radiology;  Laterality: N/A;  . Cardioversion    .  Colonoscopy with propofol N/A 10/17/2015    Procedure: COLONOSCOPY WITH PROPOFOL;  Surgeon: Manya Silvas, MD;  Location: [facility] ENDOSCOPY;  Service: Endoscopy;  Laterality: N/A;  . Electrophysiologic study N/A 12/07/2015    Procedure: V Tach Ablation;  Surgeon: Will Bonaventure Leeds, MD;  Location: [place] CV LAB;  Service: Cardiovascular;  Laterality: N/A;    Current Outpatient Prescriptions  Medication Sig  Dispense Refill  . albuterol (PROAIR HFA) 108 (90 Base) MCG/ACT inhaler Inhale into the lungs. Reported on 12/14/2015    . Ascorbic Acid (VITAMIN C) 500 MG tablet Take 500 mg by mouth daily.      Marland Kitchen atorvastatin (LIPITOR) 10 MG tablet Take 10 mg by mouth daily.     . Cholecalciferol (VITAMIN D3) 1000 UNITS CAPS Take 1,000 Units by mouth daily.     Marland Kitchen ELIQUIS 5 MG TABS tablet TAKE ONE TABLET TWICE A DAY 60 tablet 3  . finasteride (PROSCAR) 5 MG tablet Take 1 tablet (5 mg total) by mouth daily. 90 tablet 3  . mexiletine (MEXITIL) 200 MG capsule Take 1 capsule (200 mg total) by mouth 2 (two) times daily. 60 capsule 3  . Multiple Vitamin (MULTIVITAMIN WITH MINERALS) TABS tablet Take 1 tablet by mouth daily. One A Day 50+    . tamsulosin (FLOMAX) 0.4 MG CAPS capsule Take 1 capsule (0.4 mg total) by mouth 2 (two) times daily. 180 capsule 3  . torsemide (DEMADEX) 10 MG tablet Take 10 mg by mouth daily.     . verapamil (CALAN-SR) 120 MG CR tablet TAKE ONE TABLET AT BEDTIME 30 tablet 6   No current facility-administered medications for this visit.    Allergies  Allergen Reactions  . Ciprofloxacin Anaphylaxis  . Levofloxacin Anaphylaxis    "throat closes"  . Enalapril Maleate Swelling    Angioedema  . Metoprolol Swelling    edema  . Vasotec [Enalaprilat] Other (See Comments)    Reaction: Angioedema  . Sulfa Antibiotics Rash    Review of Systems negative except from HPI and PMH  Physical Exam BP 112/62 mmHg  Pulse 70  Ht 5\' 7"  (1.702 m)  Wt 169 lb 8 oz (76.885 kg)  BMI 26.54 kg/m2 Well developed and morbidly obese in no distress HENT normal E scleral and icterus clear Neck Supple JVP-Can't see Clear to ausculation  Regular rate and rhythm, no murmurs gallops or rub Soft with active bowel sounds No clubbing cyanosis Trace Edema Alert and oriented, grossly normal motor and sensory function Skin Warm and mildly diaphoretic  Sinus rhythm at 70 17/10/40 Low-voltage limb  leads    Assessment and  Plan  Ventricular tachycardia  Low-voltage ECG  Obesity  Hypotension   Paroxysmal atrial fibrillation  Sinus bradycardia  CVA   He has had multiple episodes of recurrent ventricular tachycardia over the last month or so. Targets for ablation were not evident and EP testing. I think it is time to take an alternative approach.  With his left ventricular dysfunction, with use amiodarone. Plan to stop the verapamil given his sinus bradycardia. Hopefully we will not end up with symptomatic bradycardia. Initially we will continue the mexiletine.

## 2016-02-20 NOTE — Progress Notes (Signed)
Patient Care Team: Kirk Ruths, MD as PCP - General (Unknown Physician Specialty)   HPI  Austin Warren is a 75 y.o. male Seen in followup for a ventricular tachycardia with a right bundle superior axis relatively narrow QRS complex and positive concordance suggesting a septal origin and possible verapamil sensitivity. Catheterization had demonstrated normal coronary arteries and normal left ventricular function. ECG was notable for marked reduction in voltage   Signal average Electrocardiogram was markedly abnormal.  MRI 4/16>>There is nosignificant change from the prior study with persistent basal inferior and inferolateral late gadolinium enhancement slightly more pronounced on the current study and a small focal late gadolinium enhancement at the point of inferior RV attachment to the LV wall.  He also underwent fat pad biopsy>> neg  Echocardiogram 11/15 demonstrated left ventricular hypertrophy with an ejection fraction of 45-50% with hypokinesis of the inferior apical myocardium   We initially tried him on a beta blocker prescription. He had recurrent ventricular tachycardia. He was put on verapamil.  He has had no significant recurrences of ventricular tachycardia. More recently he had recurrent tachycardia and was started on mexiletine.  5/17 he was transferred to [facility] in [place]. Evaluation included an echo with EF of 40-45%. Cardiac MR demonstrated EF of 43% with inferolateral and apical hypokinesis with inferolateral basilar scar which was if anything not worse compared to prior MR 4/16.  He underwent EP testing. No scar was found and no ablation undertaken. Ventricular flutter was inducible. VT apparently was not. There have been discussions regarding ICD implantation. He elected to defer that decision He was discharged on mexiletine.  He was seen again 6/17 for recurrent ventricular tachycardia. ECG was reviewed. Right bundle superior axis at a cycle length of just under  300 ms  6/17 started on amiodarone   He started the amiodarone about 4 weeks ago and has had no intercurrent ventricular tachycardia.  Patient denies symptoms of GI intolerance, sun sensitivity, neurological symptoms attributable to amiodarone.        He  had  stroke  11/15. He was found to be in atrial fibrillation. He was treated with TPA. Apixaban was initiated. He is tolerating this without evidence of bleeding.  He has intellectual consequences and aphasia  He underwent cardioversion from 10/22/2014.   He has had no recurrent atrial fibrillation of which he is aware.  The patient denies chest pain, shortness of breath, nocturnal dyspnea, orthopnea or peripheral edema.         Past Medical History:  Diagnosis Date  . Arthritis   . Benign prostatic hypertrophy   . CHF (congestive heart failure) ([place])   . Diabetes ([place])   . Dyspnea on exertion 11/01/2011  . Elevated PSA   . History of colon polyps   . History of colon polyps   . Hyperlipidemia   . Hypertension   . Melanoma ([place])    under arm  . Microscopic hematuria   . Osteoarthritis   . Retroperitoneal mass   . Stroke ([place]) 05/2014  . TIA (transient ischemic attack)   . Urinary retention   . UTI (urinary tract infection)   . Ventricular tachycardia (H[facility])    RBB/LAHB ideopathic VT, 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    Past Surgical History:  Procedure Laterality Date  . CARDIOVERSION    . COLONOSCOPY WITH PROPOFOL N/A 10/17/2015   Procedure: COLONOSCOPY WITH PROPOFOL;  Surgeon: Manya Silvas, MD;  Location: [facility] ENDOSCOPY;  Service: Endoscopy;  Laterality: N/A;  . ELECTROPHYSIOLOGIC  STUDY N/A 12/07/2015   Procedure: V Tach Ablation;  Surgeon: Will Stordahl Leeds, MD;  Location: [facility] CV LAB;  Service: Cardiovascular;  Laterality: N/A;  . JOINT REPLACEMENT     R TKR  . RADIOLOGY WITH ANESTHESIA N/A 05/18/2014   Procedure: RADIOLOGY WITH ANESTHESIA;  Surgeon: Rob Hickman, MD;  Location: [facility];  Service:  Radiology;  Laterality: N/A;  . REPLACEMENT TOTAL KNEE    . REPLACEMENT TOTAL KNEE Left   . VASECTOMY      Current Outpatient Prescriptions  Medication Sig Dispense Refill  . albuterol (PROAIR HFA) 108 (90 Base) MCG/ACT inhaler Inhale into the lungs. Reported on 12/14/2015    . amiodarone (PACERONE) 200 MG tablet Take 400 mg by mouth daily.    . Ascorbic Acid (VITAMIN C) 500 MG tablet Take 500 mg by mouth daily.      Marland Kitchen atorvastatin (LIPITOR) 10 MG tablet Take 10 mg by mouth daily.     . Cholecalciferol (VITAMIN D3) 1000 UNITS CAPS Take 1,000 Units by mouth daily.     Marland Kitchen ELIQUIS 5 MG TABS tablet TAKE ONE TABLET TWICE A DAY 60 tablet 3  . finasteride (PROSCAR) 5 MG tablet Take 1 tablet (5 mg total) by mouth daily. 90 tablet 3  . mexiletine (MEXITIL) 200 MG capsule Take 1 capsule (200 mg total) by mouth 2 (two) times daily. 60 capsule 3  . Multiple Vitamin (MULTIVITAMIN WITH MINERALS) TABS tablet Take 1 tablet by mouth daily. One A Day 50+    . tamsulosin (FLOMAX) 0.4 MG CAPS capsule Take 1 capsule (0.4 mg total) by mouth 2 (two) times daily. 180 capsule 3  . torsemide (DEMADEX) 10 MG tablet Take 10 mg by mouth daily.      No current facility-administered medications for this visit.     Allergies  Allergen Reactions  . Ciprofloxacin Anaphylaxis  . Levofloxacin Anaphylaxis    "throat closes"  . Enalapril Maleate Swelling    Angioedema  . Metoprolol Swelling    edema  . Vasotec [Enalaprilat] Other (See Comments)    Reaction: Angioedema  . Sulfa Antibiotics Rash    Review of Systems negative except from HPI and PMH  Physical Exam BP 120/60 (BP Location: Left Arm, Patient Position: Sitting, Cuff Size: Normal)   Pulse 66   Ht 5\' 7"  (1.702 m)   Wt 172 lb 8 oz (78.2 kg)   BMI 27.02 kg/m  Well developed and morbidly obese in no distress HENT normal E scleral and icterus clear Neck Supple JVP-Can't see Clear to ausculation  Regular rate and rhythm, no murmurs gallops or  rub Soft with active bowel sounds No clubbing cyanosis Trace Edema Alert and oriented, grossly normal motor and sensory function Skin Warm and mildly diaphoretic  Sinus rhythm at 66 16/11/45 Low-voltage limb leads    Assessment and  Plan  Ventricular tachycardia  Low-voltage ECG  Cardiomyopathy-nonischemic   Paroxysmal atrial fibrillation  Sinus bradycardia  CVA  High Risk Medication Surveillance,   He has had multiple episodes of recurrent ventricular tachycardia over the last month or so. Targets for ablation were not evident at EP testing.  Discussions at the hospital were also had and reiterated today that he is not interested in defibrillator therapy not withstanding left ventricular dysfunction   We will check amiodarone surveillance laboratories today  We will decrease his amiodarone from 400--200 mg a day. We will see him in about 3 months. At that juncture we will decide about reducing his  mexiletine  Sinus bradycardia currently seems to be asymptomatic. We will follow it.

## 2016-02-21 ENCOUNTER — Ambulatory Visit (INDEPENDENT_AMBULATORY_CARE_PROVIDER_SITE_OTHER): Payer: Medicare Other | Admitting: Internal Medicine

## 2016-02-21 ENCOUNTER — Encounter: Payer: Self-pay | Admitting: Internal Medicine

## 2016-02-21 VITALS — BP 120/60 | HR 66 | Ht 67.0 in | Wt 172.5 lb

## 2016-02-21 DIAGNOSIS — I472 Ventricular tachycardia, unspecified: Secondary | ICD-10-CM

## 2016-02-21 DIAGNOSIS — I480 Paroxysmal atrial fibrillation: Secondary | ICD-10-CM | POA: Diagnosis not present

## 2016-02-21 DIAGNOSIS — R001 Bradycardia, unspecified: Secondary | ICD-10-CM

## 2016-02-21 NOTE — Patient Instructions (Signed)
Medication Instructions: - Your physician has recommended you make the following change in your medication:  1) Decrease amiodarone to 200 mg once daily  Labwork: - Your physician recommends that you return for lab work today: liver/ tsh  Procedures/Testing: - none  Follow-Up: - Your physician recommends that you schedule a follow-up appointment in: 12 weeks with Dr. Caryl Comes.  Any Additional Special Instructions Will Be Listed Below (If Applicable).     If you need a refill on your cardiac medications before your next appointment, please call your pharmacy.

## 2016-02-22 LAB — TSH: TSH: 2.82 u[IU]/mL (ref 0.450–4.500)

## 2016-02-22 LAB — HEPATIC FUNCTION PANEL
ALBUMIN: 4.6 g/dL (ref 3.5–4.8)
ALK PHOS: 115 IU/L (ref 39–117)
ALT: 25 IU/L (ref 0–44)
AST: 23 IU/L (ref 0–40)
BILIRUBIN, DIRECT: 0.31 mg/dL (ref 0.00–0.40)
Bilirubin Total: 1.3 mg/dL — ABNORMAL HIGH (ref 0.0–1.2)
TOTAL PROTEIN: 7.1 g/dL (ref 6.0–8.5)

## 2016-02-24 ENCOUNTER — Telehealth: Payer: Self-pay

## 2016-02-24 NOTE — Telephone Encounter (Signed)
Pt is aware and agreeable of lab results.

## 2016-04-05 ENCOUNTER — Telehealth: Payer: Self-pay

## 2016-04-05 MED ORDER — MEXILETINE HCL 200 MG PO CAPS: 200.0000 mg | ORAL_CAPSULE | Freq: Two times a day (BID) | ORAL | 3 refills | Status: DC

## 2016-04-05 NOTE — Telephone Encounter (Signed)
RX refill for mexiletine 200 mg BID sent to [facility]. The patient's wife is aware.

## 2016-04-05 NOTE — Telephone Encounter (Signed)
Pt wife called stating pt needs a refill on Mexiletine. States he received this when he was in the hospital. Please call to [facility]

## 2016-04-16 ENCOUNTER — Other Ambulatory Visit: Payer: Self-pay | Admitting: Internal Medicine

## 2016-05-03 ENCOUNTER — Encounter: Payer: Self-pay | Admitting: Internal Medicine

## 2016-05-03 ENCOUNTER — Ambulatory Visit (INDEPENDENT_AMBULATORY_CARE_PROVIDER_SITE_OTHER): Payer: Medicare Other | Admitting: Internal Medicine

## 2016-05-03 VITALS — BP 128/60 | HR 62 | Ht 66.0 in | Wt 170.2 lb

## 2016-05-03 DIAGNOSIS — R001 Bradycardia, unspecified: Secondary | ICD-10-CM | POA: Diagnosis not present

## 2016-05-03 DIAGNOSIS — I480 Paroxysmal atrial fibrillation: Secondary | ICD-10-CM

## 2016-05-03 DIAGNOSIS — I472 Ventricular tachycardia, unspecified: Secondary | ICD-10-CM

## 2016-05-03 DIAGNOSIS — I428 Other cardiomyopathies: Secondary | ICD-10-CM | POA: Diagnosis not present

## 2016-05-03 DIAGNOSIS — Z79899 Other long term (current) drug therapy: Secondary | ICD-10-CM

## 2016-05-03 MED ORDER — MEXILETINE HCL 150 MG PO CAPS: ORAL_CAPSULE | ORAL | 6 refills | Status: DC

## 2016-05-03 MED ORDER — MEXILETINE HCL 150 MG PO CAPS: 150.0000 mg | ORAL_CAPSULE | Freq: Three times a day (TID) | ORAL | Status: DC

## 2016-05-03 NOTE — Addendum Note (Signed)
Addended by: Alvis Lemmings C on: 05/03/2016 06:12 PM   Modules accepted: Orders

## 2016-05-03 NOTE — Patient Instructions (Addendum)
Medication Instructions: - Your physician has recommended you make the following change in your medication:  1) Decrease mexiletine to 150 mg one tablet by mouth twice daily  Labwork: - Your physician recommends that you lab work today: Liver/ TSH  Procedures/Testing: - none ordered  Follow-Up: - Your physician recommends that you schedule a follow-up appointment in: 3 months with Dr. Caryl Comes  Any Additional Special Instructions Will Be Listed Below (If Applicable).     If you need a refill on your cardiac medications before your next appointment, please call your pharmacy.

## 2016-05-03 NOTE — Progress Notes (Signed)
Patient Care Team: Kirk Ruths, MD as PCP - General (Unknown Physician Specialty)   HPI  Austin Warren is a 75 y.o. male Seen in followup for a ventricular tachycardia with a right bundle superior axis relatively narrow QRS complex and positive concordance suggesting a septal origin and possible verapamil sensitivity. Catheterization had demonstrated normal coronary arteries and normal left ventricular function. ECG was notable for marked reduction in voltage   Signal average Electrocardiogram was markedly abnormal.  MRI 4/16>>There is nosignificant change from the prior study with persistent basal inferior and inferolateral late gadolinium enhancement slightly more pronounced on the current study and a small focal late gadolinium enhancement at the point of inferior RV attachment to the LV wall.  He also underwent fat pad biopsy>> neg  Echocardiogram 11/15 demonstrated left ventricular hypertrophy with an ejection fraction of 45-50% with hypokinesis of the inferior apical myocardium   We initially tried him on a beta blocker prescription. He had recurrent ventricular tachycardia. He was put on verapamil.   For a long time he had no significant recurrences of ventricular tachycardia. More recently he had recurrent tachycardia and was then started on mexiletine.  5/17 he was transferred to [facility] in [place]. Evaluation included an echo with EF of 40-45%. Cardiac MR demonstrated EF of 43% with inferolateral and apical hypokinesis with inferolateral basilar scar which was if anything not worse compared to prior MR 4/16. He underwent EP testing. No scar was found and no ablation undertaken. Ventricular flutter was inducible. VT apparently was not. There have been discussions regarding ICD implantation. He elected to defer that decision He was discharged on increased mexiletine. He was seen again 6/17 for recurrent ventricular tachycardia. ECG was reviewed. Right bundle superior axis at a cycle  length of just under 300 ms  6/17 started on amiodarone  He has had no interval tachypalpitations on amiodarone Patient denies symptoms of GI intolerance, sun sensitivity, neurological symptoms attributable to amiodarone.  Surveillance laboratories were in normal limits when checked 2 months ago   He  had  stroke  11/15. He was found to be in atrial fibrillation. He was treated with TPA. Apixaban was initiated. He is tolerating this without evidence of bleeding.  He has intellectual consequences and aphasia  CHA2DS2/VAS Stroke Risk Points       The previous score was 6 on 02/10/2016.:  Change:   .       1 Has Congestive Heart Failure:  Yes   0 Has Vascular Disease:  No   1 Has Hypertension:  Yes   2 Age:  81   1 Has Diabetes:  Yes   2 Had Stroke:  Yes Had TIA:  No Had thromboembolism:  No   0 Male:  No          He underwent cardioversion from 10/22/2014.   He has had no recurrent atrial fibrillation of which he is aware.   6/17 Cr 0.94 K3.9 8/17    TSH 2.8 ALT 25      Past Medical History:  Diagnosis Date  . Arthritis   . Benign prostatic hypertrophy   . Bursitis of left shoulder   . CHF (congestive heart failure) ([place])   . Diabetes ([place])   . Dyspnea on exertion 11/01/2011  . Elevated PSA   . History of colon polyps   . History of colon polyps   . Hyperlipidemia   . Hypertension   . Melanoma ([place])    under arm  .  Microscopic hematuria   . Osteoarthritis   . Retroperitoneal mass   . Stroke ([place]) 05/2014  . TIA (transient ischemic attack)   . Urinary retention   . UTI (urinary tract infection)   . Ventricular tachycardia (HCC)    RBB/LAHB ideopathic VT, 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    Past Surgical History:  Procedure Laterality Date  . CARDIOVERSION    . COLONOSCOPY WITH PROPOFOL N/A 10/17/2015   Procedure: COLONOSCOPY WITH PROPOFOL;  Surgeon: Manya Silvas, MD;  Location: [facility] ENDOSCOPY;  Service: Endoscopy;  Laterality: N/A;  . ELECTROPHYSIOLOGIC  STUDY N/A 12/07/2015   Procedure: V Tach Ablation;  Surgeon: Will Kaczorowski Leeds, MD;  Location: [place] CV LAB;  Service: Cardiovascular;  Laterality: N/A;  . JOINT REPLACEMENT     R TKR  . RADIOLOGY WITH ANESTHESIA N/A 05/18/2014   Procedure: RADIOLOGY WITH ANESTHESIA;  Surgeon: Rob Hickman, MD;  Location: [place];  Service: Radiology;  Laterality: N/A;  . REPLACEMENT TOTAL KNEE    . REPLACEMENT TOTAL KNEE Left   . VASECTOMY      Current Outpatient Prescriptions  Medication Sig Dispense Refill  . amiodarone (PACERONE) 200 MG tablet Take 200 mg by mouth daily.    . Ascorbic Acid (VITAMIN C) 500 MG tablet Take 500 mg by mouth daily.      Marland Kitchen atorvastatin (LIPITOR) 10 MG tablet Take 10 mg by mouth daily.     . Cholecalciferol (VITAMIN D3) 1000 UNITS CAPS Take 1,000 Units by mouth daily.     Marland Kitchen ELIQUIS 5 MG TABS tablet TAKE ONE TABLET TWICE A DAY 60 tablet 6  . finasteride (PROSCAR) 5 MG tablet Take 1 tablet (5 mg total) by mouth daily. 90 tablet 3  . mexiletine (MEXITIL) 200 MG capsule Take 1 capsule (200 mg total) by mouth 2 (two) times daily. 60 capsule 3  . Multiple Vitamin (MULTIVITAMIN WITH MINERALS) TABS tablet Take 1 tablet by mouth daily. One A Day 50+    . tamsulosin (FLOMAX) 0.4 MG CAPS capsule Take 1 capsule (0.4 mg total) by mouth 2 (two) times daily. 180 capsule 3  . torsemide (DEMADEX) 10 MG tablet Take 10 mg by mouth daily.      No current facility-administered medications for this visit.     Allergies  Allergen Reactions  . Ciprofloxacin Anaphylaxis  . Levofloxacin Anaphylaxis    "throat closes"  . Enalapril Maleate Swelling    Angioedema  . Metoprolol Swelling    edema  . Vasotec [Enalaprilat] Other (See Comments)    Reaction: Angioedema  . Sulfa Antibiotics Rash    Review of Systems negative except from HPI and PMH  Physical Exam BP 128/60 (BP Location: Left Arm, Patient Position: Sitting, Cuff Size: Normal)   Pulse 62   Ht 5\' 6"  (1.676 m)   Wt 170  lb 4 oz (77.2 kg)   BMI 27.48 kg/m  Well developed and morbidly obese in no distress HENT normal E scleral and icterus clear Neck Supple JVP-Can't see Clear to ausculation  Regular rate and rhythm, no murmurs gallops or rub Soft with active bowel sounds No clubbing cyanosis Trace Edema Alert and oriented, grossly normal motor and sensory function Skin Warm and mildly diaphoretic  Sinus rhythm at 62 16/11/45 Low-voltage limb leads    Assessment and  Plan  Ventricular tachycardia  Low-voltage ECG  Cardiomyopathy-nonischemic   Paroxysmal atrial fibrillation  Sinus bradycardia  CVA  High Risk Medication Surveillance,  No intercurrent Ventricular tachycardia  No intercurrent  atrial fibrillation or flutter of which she is aware  On Anticoagulation;  No bleeding issues   We will check amiodarone surveillance laboratories today  We will decrease mexiletine today 200--150. If in 3 months he has no intercurrent VT we will stop it  Sinus bradycardia currently seems to be asymptomatic. We will follow it.

## 2016-05-04 LAB — HEPATIC FUNCTION PANEL
ALT: 29 IU/L (ref 0–44)
AST: 22 IU/L (ref 0–40)
Albumin: 4.5 g/dL (ref 3.5–4.8)
Alkaline Phosphatase: 116 IU/L (ref 39–117)
Bilirubin Total: 1.2 mg/dL (ref 0.0–1.2)
Bilirubin, Direct: 0.32 mg/dL (ref 0.00–0.40)
TOTAL PROTEIN: 7.1 g/dL (ref 6.0–8.5)

## 2016-05-04 LAB — TSH: TSH: 3.4 u[IU]/mL (ref 0.450–4.500)

## 2016-05-15 ENCOUNTER — Telehealth: Payer: Self-pay

## 2016-05-15 IMAGING — CR DG CHEST 1V PORT
1 series · 1 of 1 positions shown · non-contrast
Comparison: 05/20/2014

CLINICAL DATA: Sepsis.  Fever.

EXAM:
PORTABLE CHEST - 1 VIEW

[ap]
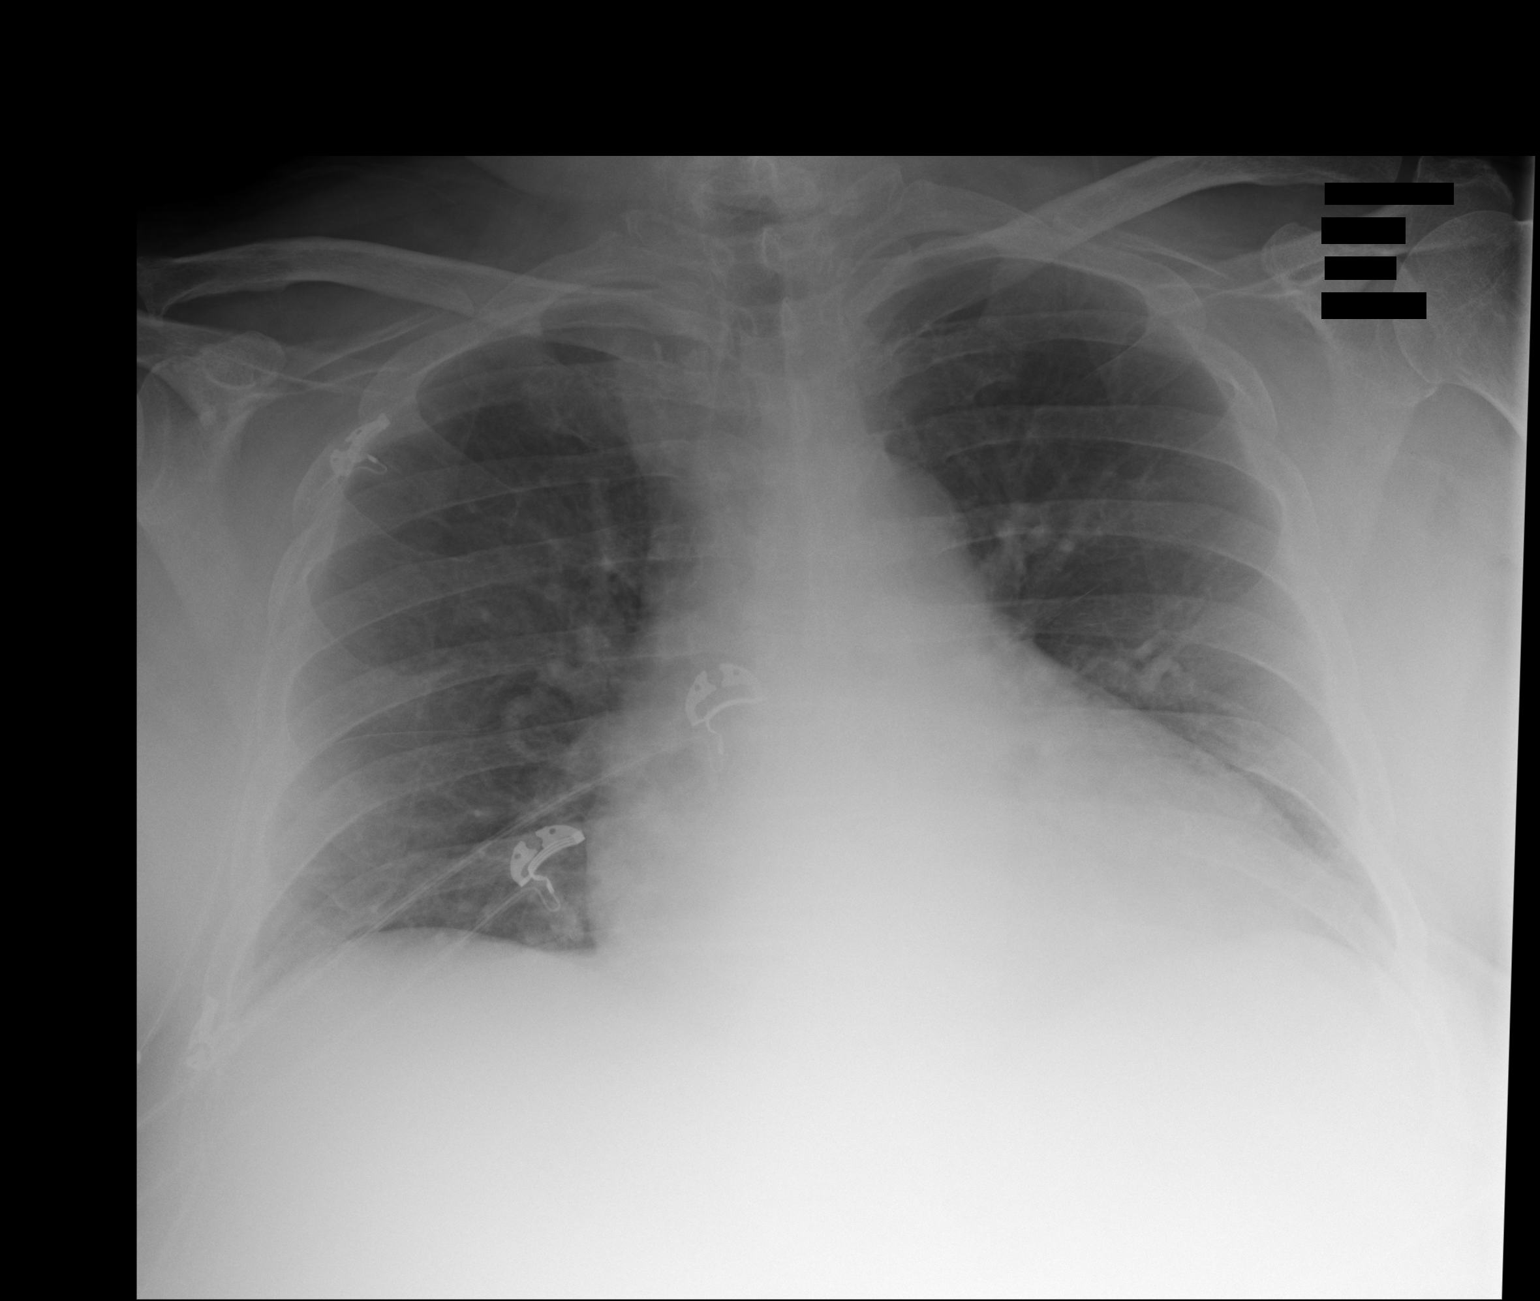

[1 of 1 positions shown; findings below may reference images not displayed]

FINDINGS: Unchanged cardiomegaly. Stable aortic and hilar contours. Previously
noted pulmonary edema has resolved. There is no convincing
pneumonia. No effusion or pneumothorax.
IMPRESSION: No active disease.

## 2016-05-15 IMAGING — CT CT HEAD WITHOUT CONTRAST
1 of 2 series · 16 of 30 positions shown, 20 images · non-contrast
Comparison: 05/18/2014

CLINICAL DATA: Fever and altered level of consciousness

EXAM:
CT HEAD WITHOUT CONTRAST
TECHNIQUE: Contiguous axial images were obtained from the base of the skull
through the vertex without intravenous contrast.

[Series 2: head wo · axial · 0.40mm/px · z∈[+999,+1128]mm · 16 of 32 slices shown, 20 images]
[im 2/32  brain]
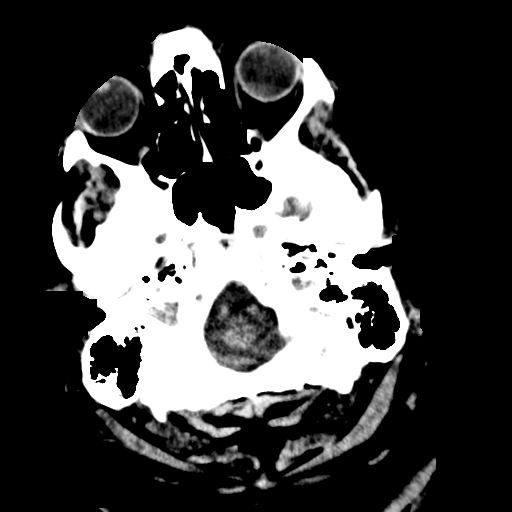
[im 2/32  bone]
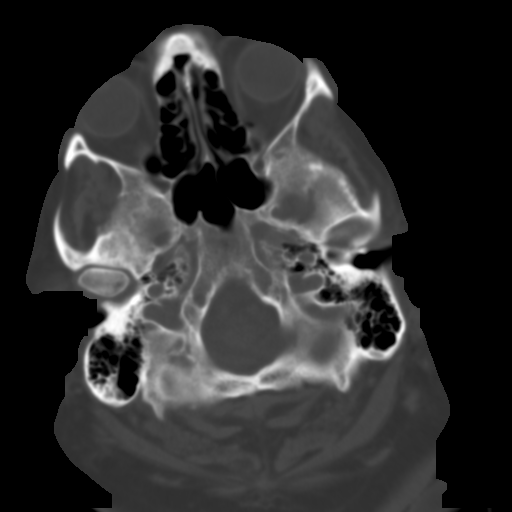
[im 3/32  brain]
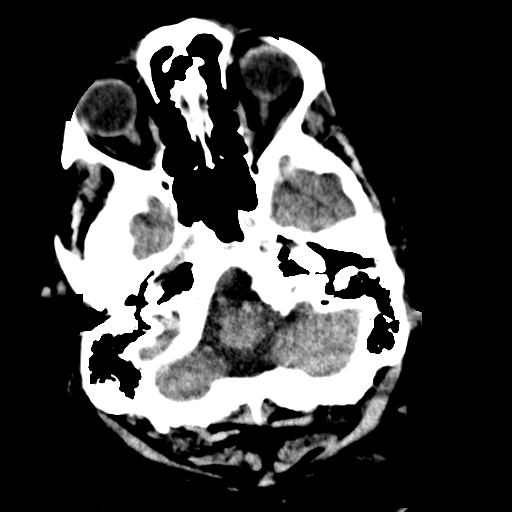
[im 6/32  brain]
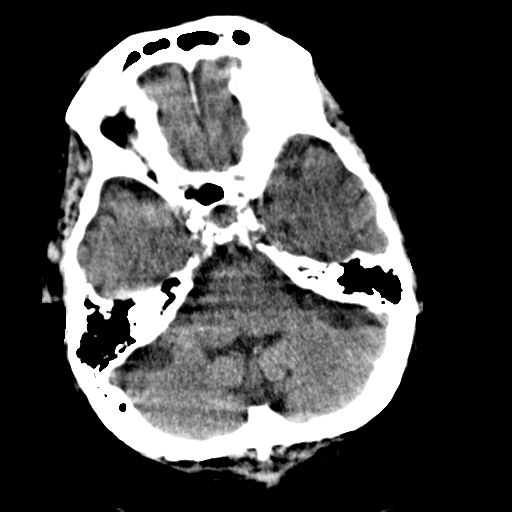
[im 8/32  brain]
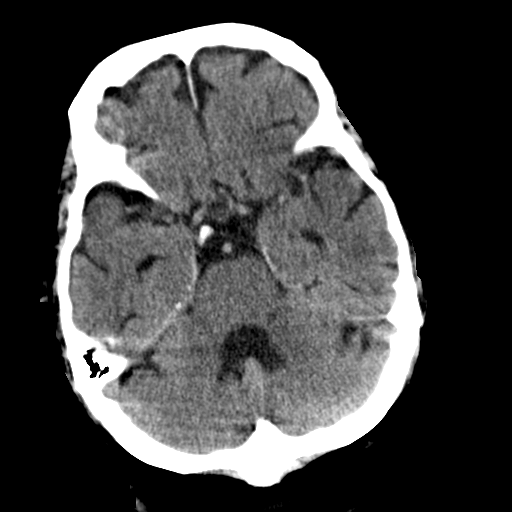
[im 9/32  brain]
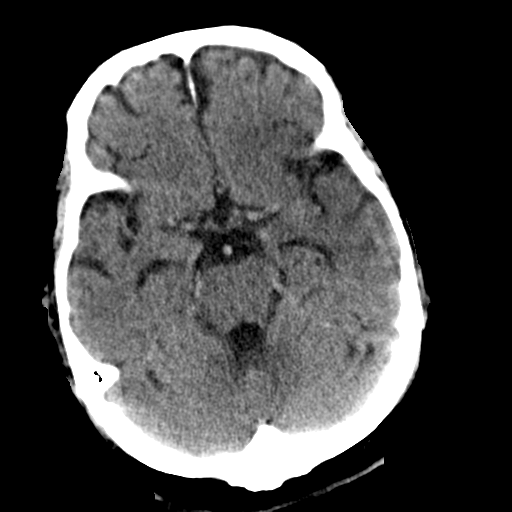
[im 9/32  bone]
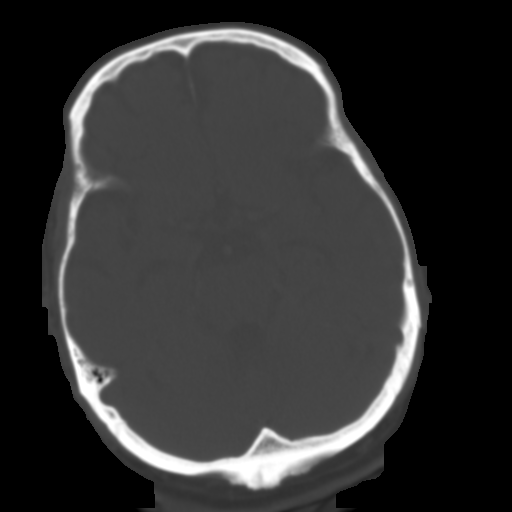
[im 11/32  brain]
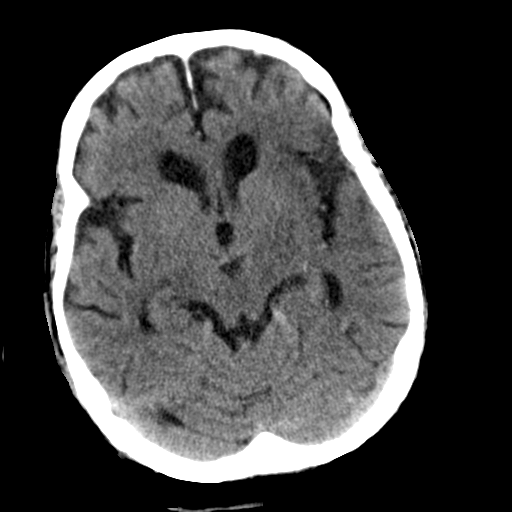
[im 14/32  brain]
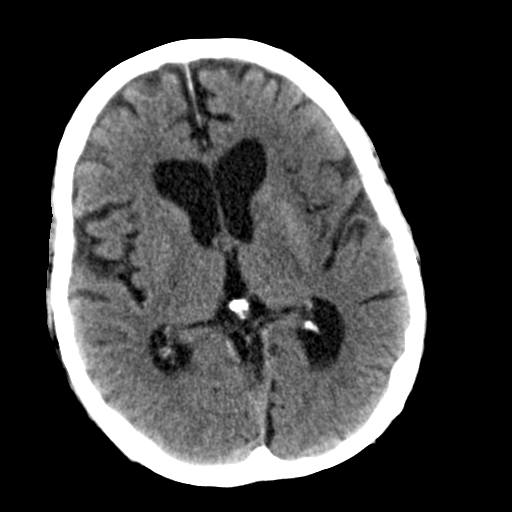
[im 15/32  brain]
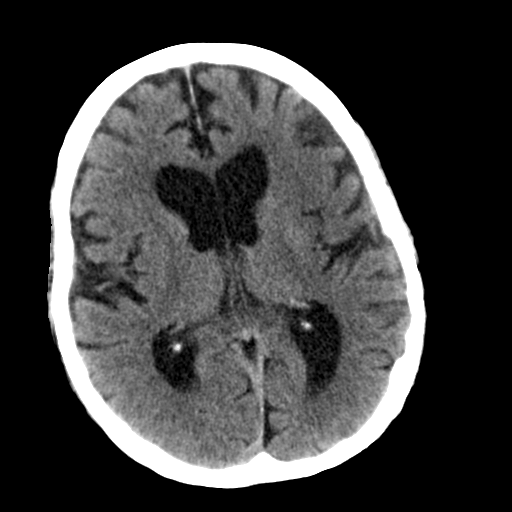
[im 17/32  brain]
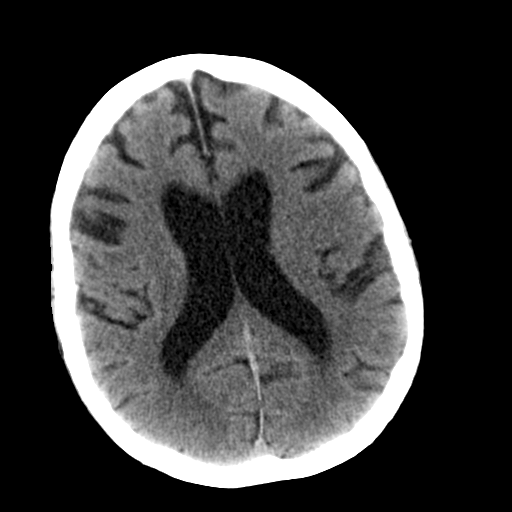
[im 17/32  bone]
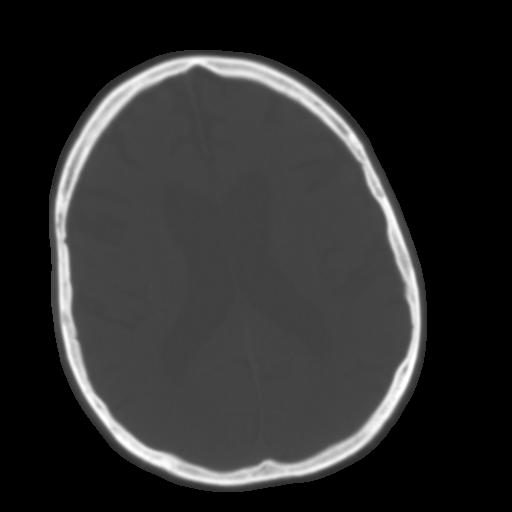
[im 18/32  brain]
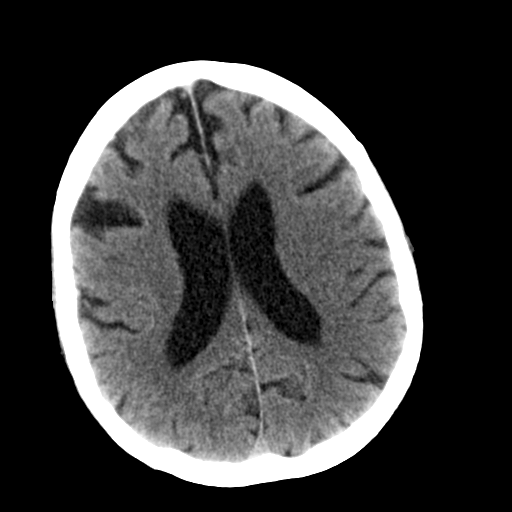
[im 21/32  brain]
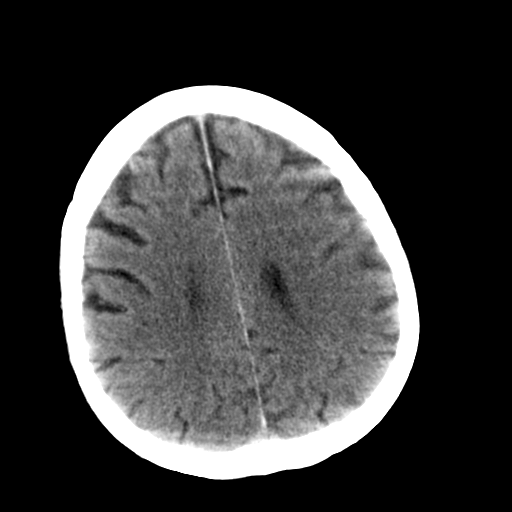
[im 23/32  brain]
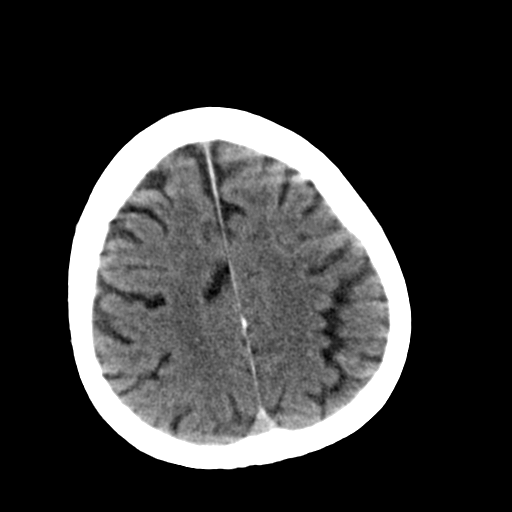
[im 24/32  brain]
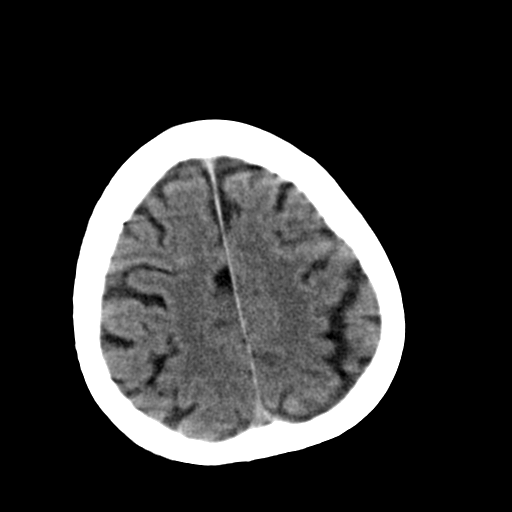
[im 24/32  bone]
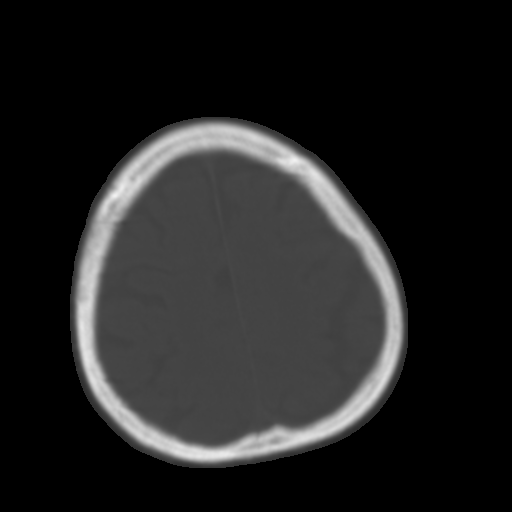
[im 26/32  brain]
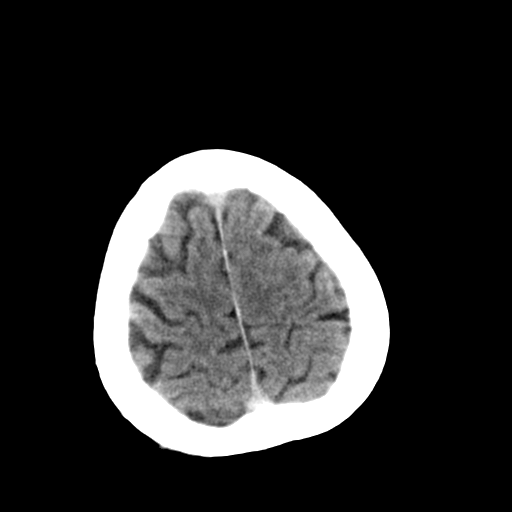
[im 29/32  brain]
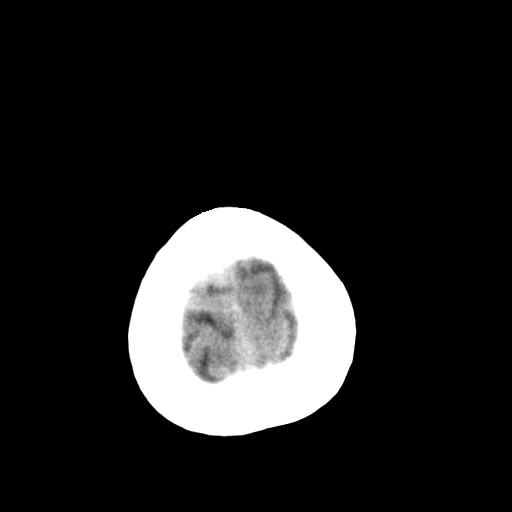
[im 30/32  brain]
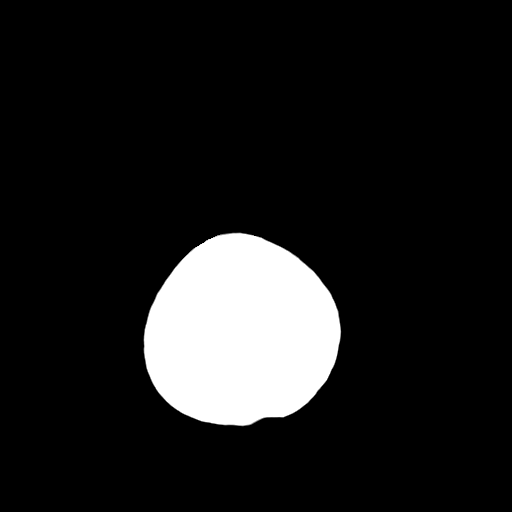

[16 of 30 positions shown; findings below may reference images not displayed]

FINDINGS: The bony calvarium is intact. Atrophic changes are noted
bilaterally. An area decreased attenuation is noted in the basal
ganglia on the left which has increased in visibility in the
interval from the prior exam consistent with an evolving infarct. No
acute hemorrhage, acute infarction or space-occupying mass lesion is
noted. A few tiny foci of air are noted along the tentorium near its
apex on the left. This is likely related to recent IV start.
IMPRESSION: Evolving infarct in the left basal ganglia centrally.

Tiny foci of air along the tentorium cerebelli as described.

## 2016-05-15 NOTE — Telephone Encounter (Signed)
Spoke to pt's wife, she is aware and agreeable to normal results. She states they had already received the results via MyChart and saw that he was within normal range but appreciated the call anyway.

## 2016-06-19 ENCOUNTER — Other Ambulatory Visit (HOSPITAL_COMMUNITY): Payer: Self-pay | Admitting: Interventional Radiology

## 2016-06-19 DIAGNOSIS — I639 Cerebral infarction, unspecified: Secondary | ICD-10-CM

## 2016-06-19 DIAGNOSIS — I771 Stricture of artery: Secondary | ICD-10-CM

## 2016-07-23 ENCOUNTER — Other Ambulatory Visit: Payer: Self-pay | Admitting: Internal Medicine

## 2016-08-02 ENCOUNTER — Ambulatory Visit: Payer: Medicare Other | Admitting: Internal Medicine

## 2016-08-08 ENCOUNTER — Ambulatory Visit
Admission: RE | Admit: 2016-08-08 | Discharge: 2016-08-08 | Disposition: A | Discharge status: Home / Self Care | Payer: Medicare Other | Source: Ambulatory Visit | Attending: Interventional Radiology | Admitting: Interventional Radiology

## 2016-08-08 DIAGNOSIS — I771 Stricture of artery: Secondary | ICD-10-CM | POA: Insufficient documentation

## 2016-08-08 DIAGNOSIS — I639 Cerebral infarction, unspecified: Secondary | ICD-10-CM | POA: Insufficient documentation

## 2016-08-08 LAB — POCT I-STAT CREATININE: CREATININE: 1 mg/dL (ref 0.61–1.24)

## 2016-08-08 MED ORDER — GADOBENATE DIMEGLUMINE 529 MG/ML IV SOLN
14.0000 mL | Freq: Once | INTRAVENOUS | Status: AC | PRN
  Administered 2016-08-08: 14 mL via INTRAVENOUS

## 2016-08-13 ENCOUNTER — Telehealth (HOSPITAL_COMMUNITY): Payer: Self-pay

## 2016-08-13 NOTE — Telephone Encounter (Signed)
Pt agreed to f/u in 1 yr with mri/mra. AW 

## 2016-08-23 ENCOUNTER — Ambulatory Visit (INDEPENDENT_AMBULATORY_CARE_PROVIDER_SITE_OTHER): Payer: Medicare Other | Admitting: Internal Medicine

## 2016-08-23 ENCOUNTER — Encounter: Payer: Self-pay | Admitting: Internal Medicine

## 2016-08-23 VITALS — BP 120/64 | HR 54 | Ht 67.0 in | Wt 178.8 lb

## 2016-08-23 DIAGNOSIS — I428 Other cardiomyopathies: Secondary | ICD-10-CM | POA: Diagnosis not present

## 2016-08-23 DIAGNOSIS — I472 Ventricular tachycardia, unspecified: Secondary | ICD-10-CM

## 2016-08-23 DIAGNOSIS — I480 Paroxysmal atrial fibrillation: Secondary | ICD-10-CM

## 2016-08-23 DIAGNOSIS — Z79899 Other long term (current) drug therapy: Secondary | ICD-10-CM

## 2016-08-23 MED ORDER — AMIODARONE HCL 200 MG PO TABS: ORAL_TABLET | ORAL | Status: DC

## 2016-08-23 NOTE — Progress Notes (Signed)
Patient Care Team: Kirk Ruths, MD as PCP - General (Unknown Physician Specialty)   HPI  Austin Warren is a 76 y.o. male Seen in followup for a ventricular tachycardia with a right bundle superior axis relatively narrow QRS complex and positive concordance suggesting a septal origin and possible verapamil sensitivity. Catheterization had demonstrated normal coronary arteries and normal left ventricular function. ECG was notable for marked reduction in voltage   Signal average Electrocardiogram was markedly abnormal.  MRI 4/16>>There is nosignificant change from the prior study with persistent basal inferior and inferolateral late gadolinium enhancement slightly more pronounced on the current study and a small focal late gadolinium enhancement at the point of inferior RV attachment to the LV wall.  He also underwent fat pad biopsy>> neg  Echocardiogram 11/15 demonstrated left ventricular hypertrophy with an ejection fraction of 45-50% with hypokinesis of the inferior apical myocardium   We initially tried him on a beta blocker prescription. He had recurrent ventricular tachycardia. He was put on verapamil.   For a long time he had no significant recurrences of ventricular tachycardia. More recently he had recurrent tachycardia and was then started on mexiletine.  5/17 he was transferred to [facility] in [place] storm. Evaluation included an echo with EF of 40-45%. Cardiac MR demonstrated EF of 43% with inferolateral and apical hypokinesis with inferolateral basilar scar which was if anything not worse compared to prior MR 4/16. He underwent EP testing. No scar was found and no ablation undertaken. Ventricular flutter was inducible. VT apparently was not. There have been discussions regarding ICD implantation. He elected to defer that decision He was discharged on increased mexiletine. He was seen again 6/17 for recurrent ventricular tachycardia. ECG was reviewed. Right bundle superior axis at a cycle  length of just under 300 ms  6/17 started on amiodarone  He has had no interval tachypalpitations on amiodarone Patient denies symptoms of GI intolerance, sun sensitivity, neurological symptoms attributable to amiodarone.  Surveillance laboratories were in normal limits when checked 2 months ago   He  had  stroke  11/15. He was found to be in atrial fibrillation. He was treated with TPA. Apixaban was initiated. He is tolerating this without evidence of bleeding.  He has intellectual consequences and aphasia  CHA2DS2/VAS Stroke Risk Points       The previous score was 6 on 02/10/2016.:   1 Has Congestive Heart Failure:  Yes   0 Has Vascular Disease:  No   1 Has Hypertension:  Yes   2 Age:  45   1 Has Diabetes:  Yes   2 Had Stroke:  Yes Had TIA:  No Had thromboembolism:  No       He underwent cardioversion from 10/22/2014.   He has had no recurrent atrial fibrillation of which he is aware.   6/17 Cr 0.94 K3.9 8/17    TSH 2.8 ALT 25 10/17    TSH  3.4 ALT 23  DATE Rate PR interval QRSduration Dose-Amio  10/18 62  162 112 200  1/18 54 212 112 200      Past Medical History:  Diagnosis Date  . Arthritis   . Benign prostatic hypertrophy   . Bursitis of left shoulder   . CHF (congestive heart failure) ([place])   . Diabetes ([place])   . Dyspnea on exertion 11/01/2011  . Elevated PSA   . History of colon polyps   . History of colon polyps   . Hyperlipidemia   . Hypertension   .  Melanoma ([place])    under arm  . Microscopic hematuria   . Osteoarthritis   . Retroperitoneal mass   . Stroke ([place]) 05/2014  . TIA (transient ischemic attack)   . Urinary retention   . UTI (urinary tract infection)   . Ventricular tachycardia ([facility])    RBB/LAHB ideopathic VT, 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    Past Surgical History:  Procedure Laterality Date  . CARDIOVERSION    . COLONOSCOPY WITH PROPOFOL N/A 10/17/2015   Procedure: COLONOSCOPY WITH PROPOFOL;  Surgeon: Manya Silvas, MD;  Location[facility]  ENDOSCOPY;  Service: Endoscopy;  Laterality: N/A;  . ELECTROPHYSIOLOGIC STUDY N/A 12/07/2015   Procedure: V Tach Ablation;  Surgeon: Will Hadden Leeds, MD;  Location: [facility];  Service: Cardiovascular;  Laterality: N/A;  . JOINT REPLACEMENT     R TKR  . RADIOLOGY WITH ANESTHESIA N/A 05/18/2014   Procedure: RADIOLOGY WITH ANESTHESIA;  Surgeon: Rob Hickman, MD;  Location: [place];  Service: Radiology;  Laterality: N/A;  . REPLACEMENT TOTAL KNEE    . REPLACEMENT TOTAL KNEE Left   . VASECTOMY      Current Outpatient Prescriptions  Medication Sig Dispense Refill  . amiodarone (PACERONE) 200 MG tablet Take 1 tablet (200 mg total) by mouth daily. 90 tablet 3  . Ascorbic Acid (VITAMIN C) 500 MG tablet Take 500 mg by mouth daily.      Marland Kitchen atorvastatin (LIPITOR) 10 MG tablet Take 10 mg by mouth daily.     . Cholecalciferol (VITAMIN D3) 1000 UNITS CAPS Take 1,000 Units by mouth daily.     Marland Kitchen ELIQUIS 5 MG TABS tablet TAKE ONE TABLET TWICE A DAY 60 tablet 6  . finasteride (PROSCAR) 5 MG tablet Take 1 tablet (5 mg total) by mouth daily. 90 tablet 3  . mexiletine (MEXITIL) 150 MG capsule Take one tablet (150 mg) by mouth twice daily 60 capsule 6  . Multiple Vitamin (MULTIVITAMIN WITH MINERALS) TABS tablet Take 1 tablet by mouth daily. One A Day 50+    . tamsulosin (FLOMAX) 0.4 MG CAPS capsule Take 1 capsule (0.4 mg total) by mouth 2 (two) times daily. 180 capsule 3  . torsemide (DEMADEX) 10 MG tablet Take 10 mg by mouth daily.      No current facility-administered medications for this visit.     Allergies  Allergen Reactions  . Ciprofloxacin Anaphylaxis  . Levofloxacin Anaphylaxis    "throat closes"  . Enalapril Maleate Swelling    Angioedema  . Metoprolol Swelling    edema  . Vasotec [Enalaprilat] Other (See Comments)    Reaction: Angioedema  . Sulfa Antibiotics Rash    Review of Systems negative except from HPI and PMH  Physical Exam BP 120/64 (BP Location: Left Arm,  Patient Position: Sitting, Cuff Size: Normal)   Pulse (!) 54   Ht 5\' 7"  (1.702 m)   Wt 178 lb 12 oz (81.1 kg)   BMI 28.00 kg/m  Well developed and morbidly obese in no distress HENT normal E scleral and icterus clear Neck Supple JVP-flat; carotids without bruits  Clear to ausculation  Regular rate and rhythm, no murmurs gallops or rub Soft with active bowel sounds No clubbing cyanosis no Edema Alert and oriented, grossly normal motor and sensory function Skin Warm and mildly diaphoretic  Sinus rhythm at 54 21/11/44 Low-voltage limb leads    Assessment and  Plan  Ventricular tachycardia  Low-voltage ECG  Cardiomyopathy-nonischemic   Paroxysmal atrial fibrillation  Sinus bradycardia  High Risk Medication Surveillance,  Tolerating amiodarone, but sinus bradycardia seems to be worsening; there is also no evidence of first-degree AV block. We will decrease his amiodarone dose. We will continue him on mexiletine  Amiodarone surveillance laboratories were normal 10/17   I've been in touch with Dr. Bethann Punches. He'll be rechecked again next week  On Anticoagulation;  No bleeding issues   No intercurrent atrial fibrillation or flutter

## 2016-08-23 NOTE — Patient Instructions (Addendum)
Medication Instructions: - Your physician has recommended you make the following change in your medication:  1) Decrease amiodarone 200 mg- take one tablet by mouth daily Monday- Friday  Labwork: - none ordered  Procedures/Testing: - none ordered  Follow-Up: - Your physician wants you to follow-up in: September 2018 with Dr. Caryl Comes. You will receive a reminder letter in the mail two months in advance. If you don't receive a letter, please call our office to schedule the follow-up appointment.  Any Additional Special Instructions Will Be Listed Below (If Applicable).     If you need a refill on your cardiac medications before your next appointment, please call your pharmacy.

## 2016-10-09 IMAGING — US US BIOPSY
1 series · 13 of 14 positions shown · non-contrast
Comparison: CT the abdomen pelvis - 12/28/2008

INDICATION: Concern for amyloidosis. Please perform ultrasound-guided fat pad
biopsy for tissue diagnostic purposes.

EXAM:
ULTRASOUND GUIDED ANTERIOR ABDOMINAL FAT PAD BIOPSY

[Series 1: us biopsy · 0.07mm/px · 13 of 14 slices shown]
[im 1/14]
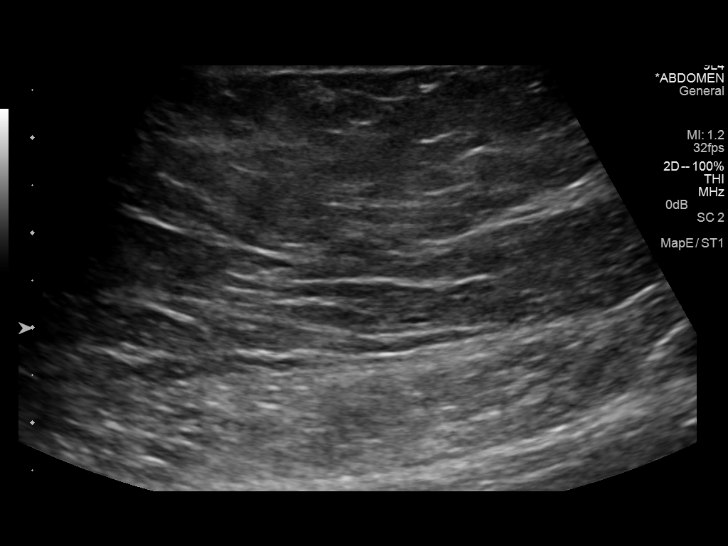
[im 2/14]
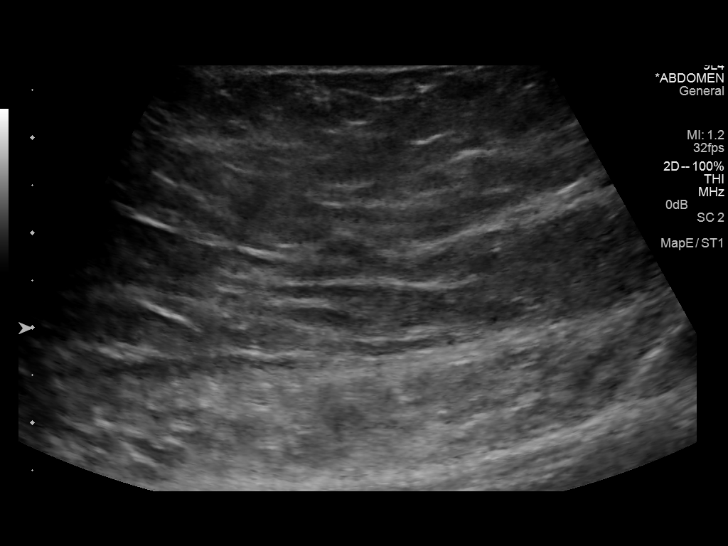
[im 3/14]
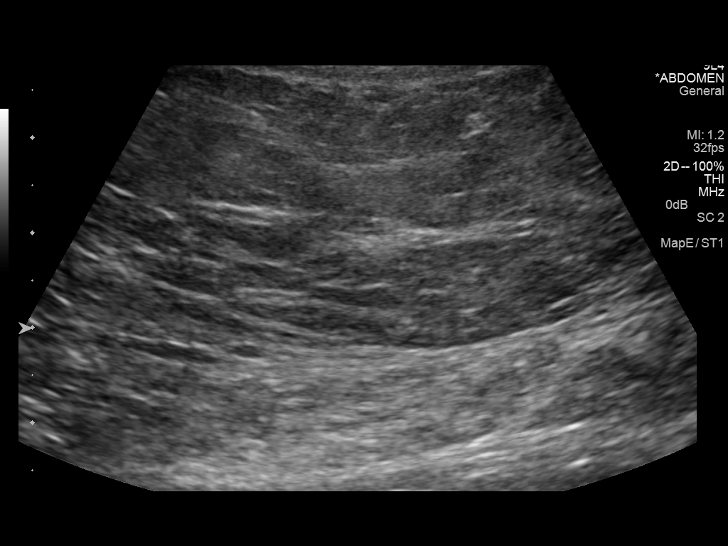
[im 4/14]
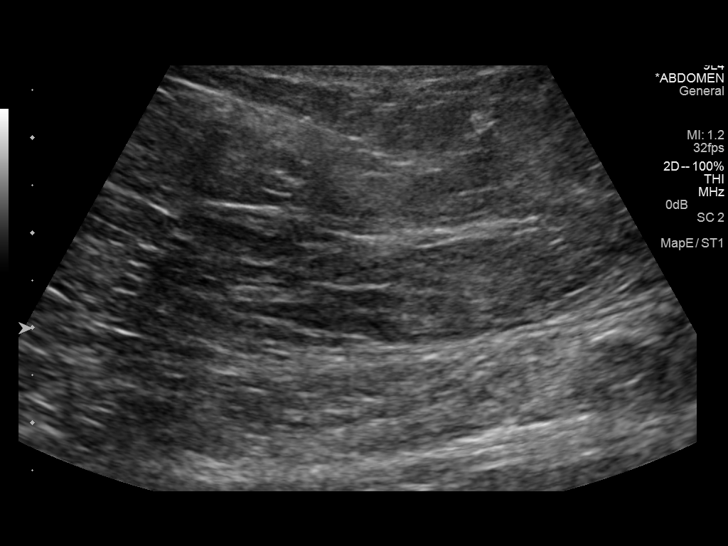
[im 5/14]
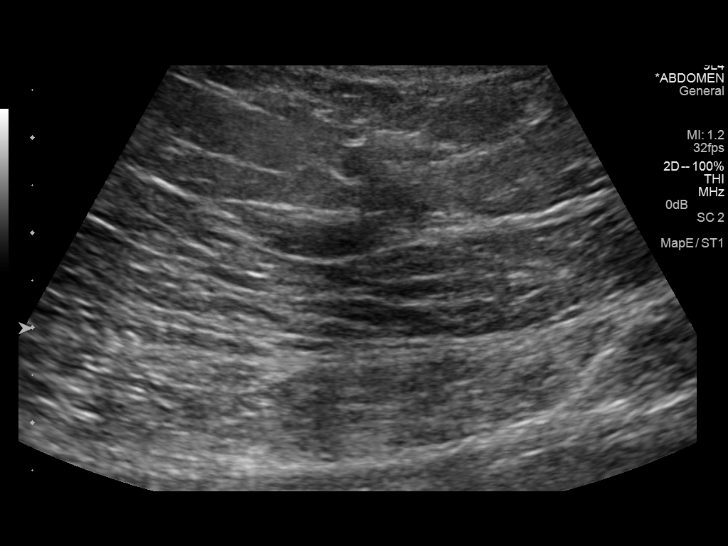
[im 6/14]
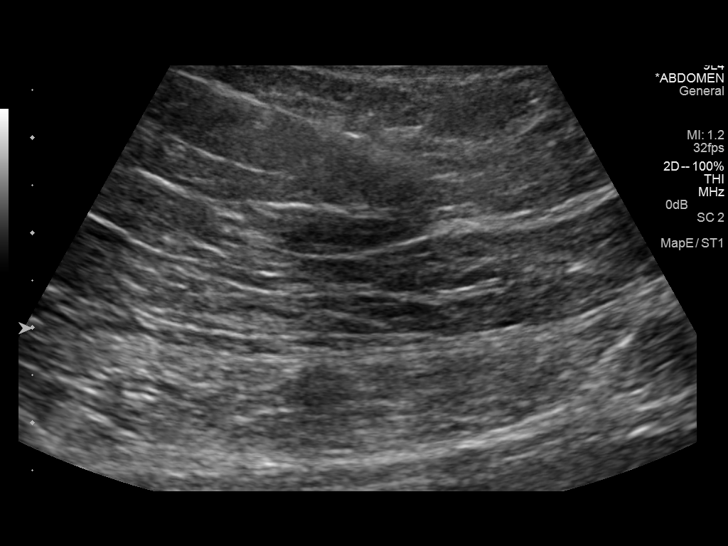
[im 8/14]
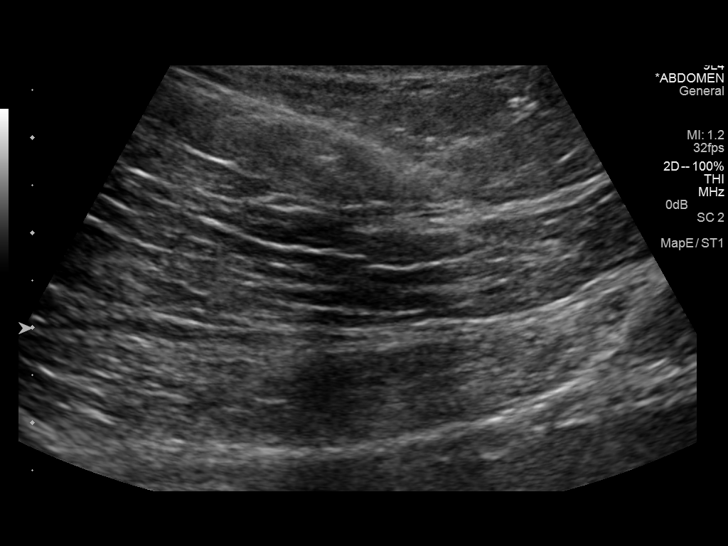
[im 9/14]
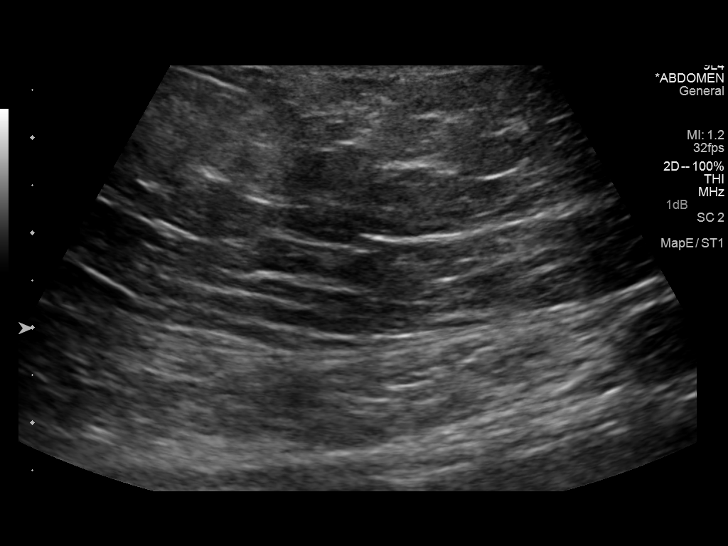
[im 10/14]
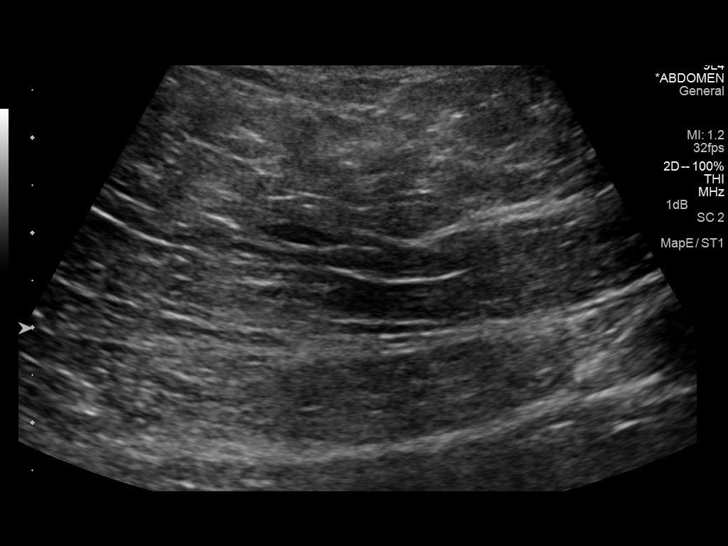
[im 11/14]
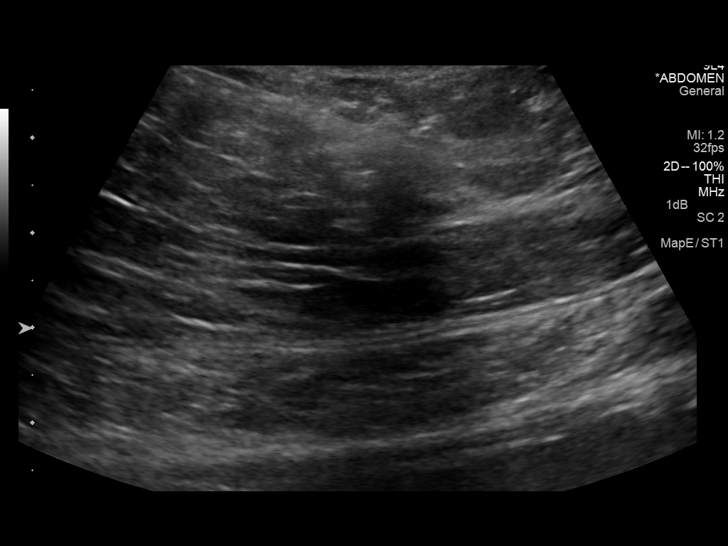
[im 12/14]
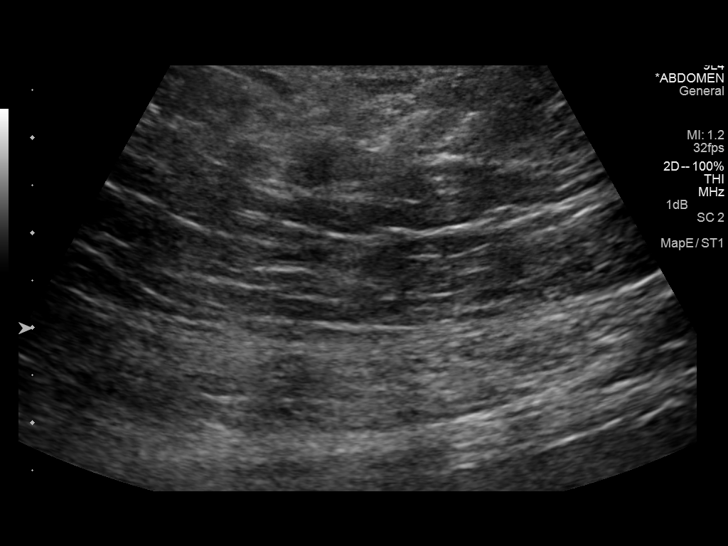
[im 13/14]
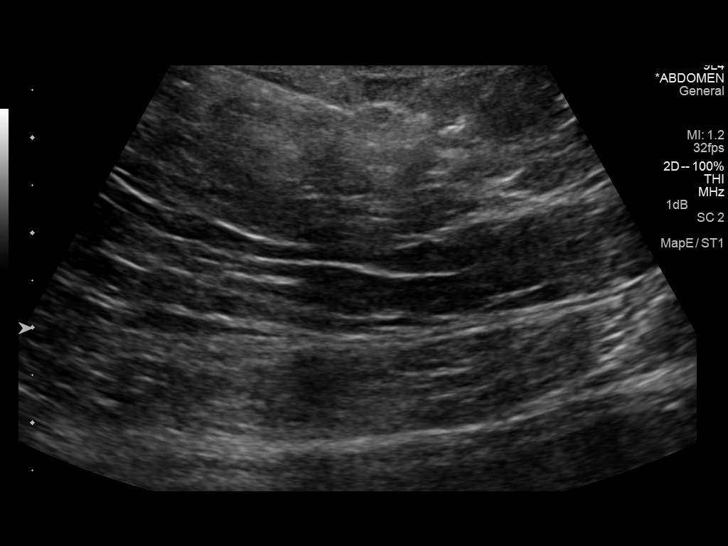
[im 14/14]
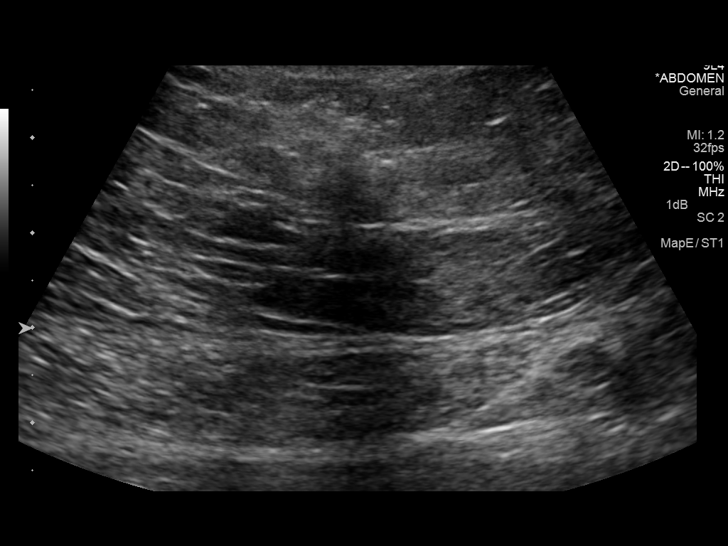

[13 of 14 positions shown; findings below may reference images not displayed]

MEDICATIONS:
Fentanyl 25 mcg IV; Versed 1 mg IV

ANESTHESIA/SEDATION:
Total Moderate Sedation time

8 minutes

COMPLICATIONS:
None immediate

PROCEDURE:
Informed written consent was obtained from the patient after a
discussion of the risks, benefits and alternatives to treatment. The
patient understands and consents the procedure. A timeout was
performed prior to the initiation of the procedure.

Ultrasound scanning was performed of the anterior aspect of the
midline of the lower abdomen, caudal to the umbilicus. This location
was selected for fat pad biopsy. The procedure was planned. The
operative site was prepped and draped in the usual sterile fashion.
The overlying soft tissues were anesthetized with 1% lidocaine with
epinephrine.

A 17 gauge core needle biopsy device was advanced into the
subcutaneous fat of the midline of the inferior anterior abdominal
wall and 6 core biopsies were obtained under direct ultrasound
guidance. Multiple ultrasound images were saved for documentation
purposes. The biopsy device was removed and hemostasis was obtained
with manual compression. Post procedural scanning was negative for
significant post procedural hemorrhage or additional complication. A
dressing was placed. The patient tolerated the procedure well
without immediate post procedural complication.
IMPRESSION: Technically successful ultrasound guided biopsy of inferior anterior
abdominal wall fat pad.

## 2016-11-12 ENCOUNTER — Other Ambulatory Visit: Payer: Self-pay | Admitting: Internal Medicine

## 2016-11-13 NOTE — Telephone Encounter (Signed)
Age 76 Wt 81.1kg (08/23/2016)  Saw Dr Caryl Comes on 08/23/2016 01/10/2016 Hgb 15.2 HCT 44.1 01/10/2016 SrCr 0.94  Refill done for Eliquis 5mg  q 12 hours

## 2016-12-11 ENCOUNTER — Other Ambulatory Visit: Payer: Self-pay | Admitting: Internal Medicine

## 2016-12-11 NOTE — Progress Notes (Signed)
12/13/2016 10:42 AM   Austin Warren 01-08-1941 [id number]  Referring provider: Kirk Ruths, MD [facility] [place], [place] [address]  Chief Complaint  Patient presents with  . Benign Prostatic Hypertrophy    1 year follow up    HPI: Patient is a 76 year old Caucasian male with a history of elevated PSA, history of hematuria, and BPH with LUTS who presents today for a 1 year follow-up.  History of elevated PSA Patient underwent a biopsy of his prostate in 2005 for a PSA elevation of 5.5.  The results were benign.  He was initiated on finasteride 5 mg daily. His most recent PSA was 0.9 on 12/10/2014.  Due to his age and AUA guidelines, it was decided to discontinue PSA screenings as his last several PSAs have returned below 3.  We will continue annual rectal exams and if an abnormal finding is discovered, we will have further discussions at that time.  Microscopic hematuria Patient underwent a hematuria workup in 2016 and was found to have an enlarged prostate and a Bosniak 2 left lower pole cyst.  He does not report any episodes of gross hematuria over the last year.  BPH WITH LUTS His IPSS score today is 12, which is moderate lower urinary tract symptomatology.  He is pleased with his quality life due to his urinary symptoms.  His previous IPSS score was 2/1.  His previous PVR is 199 mL.  His major complaint today is nocturia x 2.  He has had these symptoms for several years.  He denies any dysuria, hematuria or suprapubic pain.  He currently taking tamsulosin 0.4 mg daily and finasteride 5 mg daily.  His has had cystoscopy on 05/03/2015 and found to have an enlarged prostate, estimated size greater than 100 g, significant trabeculation and left small Hutch diverticuli without masses.  He also denies any recent fevers, chills, nausea or vomiting.  He does not have a family history of PCa.      IPSS    Row Name 12/13/16 1000         International Prostate Symptom Score   How often have you had the sensation of not emptying your bladder? Less than 1 in 5     How often have you had to urinate less than every two hours? Less than 1 in 5 times     How often have you found you stopped and started again several times when you urinated? About half the time     How often have you found it difficult to postpone urination? About half the time     How often have you had a weak urinary stream? Less than half the time     How often have you had to strain to start urination? Less than 1 in 5 times     How many times did you typically get up at night to urinate? 1 Time     Total IPSS Score 12       Quality of Life due to urinary symptoms   If you were to spend the rest of your life with your urinary condition just the way it is now how would you feel about that? Pleased        Score:  1-7 Mild 8-19 Moderate 20-35 Severe    PMH: Past Medical History:  Diagnosis Date  . Arthritis   . Benign prostatic hypertrophy   . Bursitis of left shoulder   .  CHF (congestive heart failure) ([place])   . Diabetes ([place])   . Dyspnea on exertion 11/01/2011  . Elevated PSA   . History of colon polyps   . History of colon polyps   . Hyperlipidemia   . Hypertension   . Melanoma ([place])    under arm  . Microscopic hematuria   . Osteoarthritis   . Retroperitoneal mass   . Stroke ([place]) 05/2014  . TIA (transient ischemic attack)   . Urinary retention   . UTI (urinary tract infection)   . Ventricular tachycardia ([facility])    RBB/LAHB ideopathic VT, 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    Surgical History: Past Surgical History:  Procedure Laterality Date  . CARDIOVERSION    . COLONOSCOPY WITH PROPOFOL N/A 10/17/2015   Procedure: COLONOSCOPY WITH PROPOFOL;  Surgeon: Manya Silvas, MD;  Location: [facility] ENDOSCOPY;  Service: Endoscopy;  Laterality: N/A;  . ELECTROPHYSIOLOGIC STUDY N/A 12/07/2015   Procedure: V Tach Ablation;  Surgeon: Will Allnutt Leeds,  MD;  Location: [facility];  Service: Cardiovascular;  Laterality: N/A;  . JOINT REPLACEMENT     R TKR  . RADIOLOGY WITH ANESTHESIA N/A 05/18/2014   Procedure: RADIOLOGY WITH ANESTHESIA;  Surgeon: Rob Hickman, MD;  Location: [facility];  Service: Radiology;  Laterality: N/A;  . REPLACEMENT TOTAL KNEE    . REPLACEMENT TOTAL KNEE Left   . VASECTOMY      Home Medications:  Allergies as of 12/13/2016      Reactions   Ciprofloxacin Anaphylaxis   Levofloxacin Anaphylaxis   "throat closes"   Enalapril Maleate Swelling   Angioedema   Metoprolol Swelling   edema   Vasotec [enalaprilat] Other (See Comments)   Reaction: Angioedema   Sulfa Antibiotics Rash, Other (See Comments)   Hypotension      Medication List       Accurate as of 12/13/16 10:42 AM. Always use your most recent med list.          amiodarone 200 MG tablet Commonly known as:  PACERONE Take by mouth.   aspirin EC 81 MG tablet Take by mouth.   atorvastatin 10 MG tablet Commonly known as:  LIPITOR Take 10 mg by mouth daily.   ELIQUIS 5 MG Tabs tablet Generic drug:  apixaban TAKE ONE TABLET TWICE A DAY   finasteride 5 MG tablet Commonly known as:  PROSCAR Take 1 tablet (5 mg total) by mouth daily.   mexiletine 150 MG capsule Commonly known as:  MEXITIL TAKE 1 CAPSULE BY MOUTH TWICE DAILY   multivitamin with minerals Tabs tablet Take 1 tablet by mouth daily. One A Day 50+   senna-docusate 8.6-50 MG tablet Commonly known as:  Senokot-S Take by mouth.   tamsulosin 0.4 MG Caps capsule Commonly known as:  FLOMAX Take 1 capsule (0.4 mg total) by mouth daily.   torsemide 10 MG tablet Commonly known as:  DEMADEX Take 10 mg by mouth daily.   vitamin C 500 MG tablet Commonly known as:  ASCORBIC ACID Take 500 mg by mouth daily.   Vitamin D3 1000 units Caps Take 1,000 Units by mouth daily.       Allergies:  Allergies  Allergen Reactions  . Ciprofloxacin Anaphylaxis  . Levofloxacin  Anaphylaxis    "throat closes"  . Enalapril Maleate Swelling    Angioedema  . Metoprolol Swelling    edema  . Vasotec [Enalaprilat] Other (See Comments)    Reaction: Angioedema  . Sulfa Antibiotics Rash and Other (See Comments)  Hypotension    Family History: Family History  Problem Relation Age of Onset  . Stroke Father   . Hypertension Father   . Heart attack Neg Hx   . Kidney disease Neg Hx   . Prostate cancer Neg Hx   . Kidney cancer Neg Hx   . Bladder Cancer Neg Hx     Social History:  reports that he quit smoking about 46 years ago. His smoking use included Cigarettes. He started smoking about 56 years ago. He has a 20.00 pack-year smoking history. He has never used smokeless tobacco. He reports that he drinks alcohol. He reports that he does not use drugs.  ROS: UROLOGY Frequent Urination?: No Hard to postpone urination?: No Burning/pain with urination?: No Get up at night to urinate?: No Leakage of urine?: No Urine stream starts and stops?: No Trouble starting stream?: No Do you have to strain to urinate?: No Blood in urine?: No Urinary tract infection?: No Sexually transmitted disease?: No Injury to kidneys or bladder?: No Painful intercourse?: No Weak stream?: No Erection problems?: No Penile pain?: No  Gastrointestinal Nausea?: No Vomiting?: No Indigestion/heartburn?: No Diarrhea?: No Constipation?: No  Constitutional Fever: No Night sweats?: No Weight loss?: No Fatigue?: No  Skin Skin rash/lesions?: No Itching?: No  Eyes Blurred vision?: No Double vision?: No  Ears/Nose/Throat Sore throat?: No Sinus problems?: No  Hematologic/Lymphatic Swollen glands?: No Easy bruising?: No  Cardiovascular Leg swelling?: No Chest pain?: No  Respiratory Cough?: No Shortness of breath?: No  Endocrine Excessive thirst?: No  Musculoskeletal Back pain?: No Joint pain?: No  Neurological Headaches?: No Dizziness?:  No  Psychologic Depression?: No Anxiety?: No  Physical Exam: BP 138/76   Pulse 69   Ht 5\' 6"  (1.676 m)   Wt 174 lb 14.4 oz (79.3 kg)   BMI 28.23 kg/m   Constitutional: Well nourished. Alert and oriented, No acute distress. HEENT: [place] AT, moist mucus membranes. Trachea midline, no masses. Cardiovascular: No clubbing, cyanosis, or edema. Respiratory: Normal respiratory effort, no increased work of breathing. GI: Abdomen is soft, non tender, non distended, no abdominal masses. Liver and spleen not palpable.  No hernias appreciated.  Stool sample for occult testing is not indicated.   GU: No CVA tenderness.  No bladder fullness or masses.  Patient with circumcised phallus.   Urethral meatus is patent.  No penile discharge. No penile lesions or rashes. Scrotum without lesions, cysts, rashes and/or edema.  Testicles are located scrotally bilaterally. No masses are appreciated in the testicles. Left and right epididymis are normal. Rectal: Patient with  normal sphincter tone. Anus and perineum without scarring or rashes. No rectal masses are appreciated. Prostate is approximately 65 grams, no nodules are appreciated. Seminal vesicles are normal. Skin: No rashes, bruises or suspicious lesions. Lymph: No cervical or inguinal adenopathy. Neurologic: Grossly intact, no focal deficits, moving all 4 extremities. Psychiatric: Normal mood and affect.  Laboratory Data: Lab Results  Component Value Date   WBC 6.1 01/10/2016   HGB 15.2 01/10/2016   HCT 44.1 01/10/2016   MCV 93.4 01/10/2016   PLT 107 (L) 01/10/2016    Lab Results  Component Value Date   CREATININE 1.00 08/08/2016   PSA History:  5.5 ng/mL on 10/00/2005-bx was benign  1.3 ng/mL on 00/00/2013  1.4 ng/mL on 11/26/2012  1.6 ng/mL on 11/26/2013  0.9 ng/mL on 12/10/2014   Lab Results  Component Value Date   HGBA1C 6.3 (H) 05/19/2014    Lab Results  Component Value Date  TSH 3.400 05/03/2016       Component Value  Date/Time   CHOL 124 05/19/2014 0510   HDL 38 (L) 05/19/2014 0510   CHOLHDL 3.3 05/19/2014 0510   VLDL 28 05/19/2014 0510   LDLCALC 58 05/19/2014 0510    Lab Results  Component Value Date   AST 22 05/03/2016   Lab Results  Component Value Date   ALT 29 05/03/2016    Results for orders placed or performed during the hospital encounter of 08/08/16  I-STAT creatinine  Result Value Ref Range   Creatinine, Ser 1.00 0.61 - 1.24 mg/dL     Assessment & Plan:    1. History of hematuria:    Patient underwent hematuria workup in 2016. He was found to have BPH and Bosniak #2 cyst. He has not had any gross hematuria over the last year.   We will continue to monitor.   2. BPH with LUTS:  -IPSS score is 12/1  -Continue tamsulosin 0.4 mg daily and finasteride 5 mg daily  -RTC in 12 months for IPSS score and exam     Return in about 1 year (around 12/13/2017) for IPSS and exam.  These notes generated with voice recognition software. I apologize for typographical errors.  Zara Council, [facility] [address], [place], [place] [address] [phone]

## 2016-12-13 ENCOUNTER — Encounter: Payer: Self-pay | Admitting: Urology

## 2016-12-13 ENCOUNTER — Ambulatory Visit: Payer: Medicare Other | Admitting: Urology

## 2016-12-13 VITALS — BP 138/76 | HR 69 | Ht 66.0 in | Wt 174.9 lb

## 2016-12-13 DIAGNOSIS — N138 Other obstructive and reflux uropathy: Secondary | ICD-10-CM

## 2016-12-13 DIAGNOSIS — Z87448 Personal history of other diseases of urinary system: Secondary | ICD-10-CM | POA: Diagnosis not present

## 2016-12-13 DIAGNOSIS — N401 Enlarged prostate with lower urinary tract symptoms: Secondary | ICD-10-CM | POA: Diagnosis not present

## 2016-12-13 MED ORDER — TAMSULOSIN HCL 0.4 MG PO CAPS: 0.4000 mg | ORAL_CAPSULE | Freq: Every day | ORAL | 3 refills | Status: DC

## 2016-12-13 MED ORDER — FINASTERIDE 5 MG PO TABS: 5.0000 mg | ORAL_TABLET | Freq: Every day | ORAL | 3 refills | Status: DC

## 2017-02-01 ENCOUNTER — Other Ambulatory Visit: Payer: Self-pay | Admitting: Urology

## 2017-02-11 ENCOUNTER — Telehealth: Payer: Self-pay | Admitting: Urology

## 2017-02-11 NOTE — Telephone Encounter (Signed)
Pt currently takes Tamsulosin .4 mg.  Shannon instructed pt to take 2 per day for 2-3 years now.  Pharmacy [facility])  Will not refill Rx for 2 per day, only 1 per day.  They sent a request to [place] but the same (1 per day) was sent back.  Pt has 2-3 days left.  Please give pt a call [phone].

## 2017-02-11 NOTE — Telephone Encounter (Signed)
I see from 2017 pt was on tamsulosin bid, but in 2018 note tamsulosin was once daily. Please advise.

## 2017-02-12 ENCOUNTER — Other Ambulatory Visit: Payer: Self-pay | Admitting: Urology

## 2017-02-12 MED ORDER — TAMSULOSIN HCL 0.4 MG PO CAPS: 0.4000 mg | ORAL_CAPSULE | Freq: Every day | ORAL | 3 refills | Status: DC

## 2017-02-12 NOTE — Telephone Encounter (Signed)
Spoke with pt wife in reference to tamsulosin at 180 pills. Wife voiced understanding.

## 2017-02-12 NOTE — Telephone Encounter (Signed)
The script was for 180 capsules, that's two a day for 90 days.  I corrected the script to say two capsules a day.

## 2017-02-12 NOTE — Telephone Encounter (Signed)
LMOM

## 2017-02-21 ENCOUNTER — Encounter: Payer: Self-pay | Admitting: Internal Medicine

## 2017-02-21 ENCOUNTER — Ambulatory Visit (INDEPENDENT_AMBULATORY_CARE_PROVIDER_SITE_OTHER): Payer: Medicare Other | Admitting: Internal Medicine

## 2017-02-21 VITALS — BP 120/70 | HR 64 | Ht 66.0 in | Wt 174.8 lb

## 2017-02-21 DIAGNOSIS — I472 Ventricular tachycardia, unspecified: Secondary | ICD-10-CM

## 2017-02-21 DIAGNOSIS — I480 Paroxysmal atrial fibrillation: Secondary | ICD-10-CM

## 2017-02-21 DIAGNOSIS — R001 Bradycardia, unspecified: Secondary | ICD-10-CM

## 2017-02-21 DIAGNOSIS — I428 Other cardiomyopathies: Secondary | ICD-10-CM | POA: Diagnosis not present

## 2017-02-21 DIAGNOSIS — Z79899 Other long term (current) drug therapy: Secondary | ICD-10-CM | POA: Diagnosis not present

## 2017-02-21 NOTE — Progress Notes (Signed)
Patient Care Team: Kirk Ruths, MD as PCP - General (Unknown Physician Specialty)   HPI  Austin Warren is a 76 y.o. male Seen in followup for a ventricular tachycardia with a right bundle superior axis relatively narrow QRS complex and positive concordance suggesting a septal origin and possible verapamil sensitivity. Catheterization had demonstrated normal coronary arteries and normal left ventricular function. ECG was notable for marked reduction in voltage   Signal average Electrocardiogram was markedly abnormal.  MRI 4/16>>There is nosignificant change from the prior study with persistent basal inferior and inferolateral late gadolinium enhancement slightly more pronounced on the current study and a small focal late gadolinium enhancement at the point of inferior RV attachment to the LV wall.  He also underwent fat pad biopsy>> neg  Echocardiogram 11/15 demonstrated left ventricular hypertrophy with an ejection fraction of 45-50% with hypokinesis of the inferior apical myocardium   We initially tried him on a beta blocker prescription. He had recurrent ventricular tachycardia. He was put on verapamil.   For a long time he had no significant recurrences of ventricular tachycardia. More recently he had recurrent tachycardia and was then started on mexiletine.  5/17 he was transferred to [facility] in [place]. Evaluation included an echo with EF of 40-45%. Cardiac MR demonstrated EF of 43% with inferolateral and apical hypokinesis with inferolateral basilar scar which was if anything not worse compared to prior MR 4/16. He underwent EP testing. No scar was found and no ablation undertaken. Ventricular flutter was inducible. VT apparently was not. There have been discussions regarding ICD implantation. He elected to defer that decision He was discharged on increased mexiletine. He was seen again 6/17 for recurrent ventricular tachycardia. ECG was reviewed. Right bundle superior axis at a cycle  length of just under 300 ms  6/17 started on amiodarone  More recently bradycardia ensued; we had to down titrate his amiodarone. He has been stable on the combination of amiodarone and mexiletine with good exercise tolerance. He denies chest pain shortness of breath or peripheral edema.    He  had  stroke  11/15. He was found to be in atrial fibrillation. He was treated with TPA. Apixaban was initiated.  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 No bleeding issues   Patient denies symptoms of GI intolerance, sun sensitivity, neurological symptoms attributable to amiodarone.  Surveillance laboratories were in normal limits when checked 6 months ago    No interval AF of which he is aware.   6/17 Cr 0.94 K3.9 8/17    TSH 2.8 ALT 25 10/17    TSH  3.4 ALT 23   DATE Rate PR interval QRSduration Dose-Amio  10/18 62  162 112 200  1/18 54 212 112 200                  Past Medical History:  Diagnosis Date  . Arthritis   . Benign prostatic hypertrophy   . Bursitis of left shoulder   . CHF (congestive heart failure) ([place])   . Diabetes ([place])   . Dyspnea on exertion 11/01/2011  . Elevated PSA   . History of colon polyps   . History of colon polyps   . Hyperlipidemia   . Hypertension   . Melanoma ([place])    under arm  . Microscopic hematuria   . Osteoarthritis   . Retroperitoneal mass   . Stroke ([place]) 05/2014  . TIA (transient ischemic attack)   . Urinary retention   .  UTI (urinary tract infection)   . Ventricular tachycardia (H[facility])    RBB/LAHB ideopathic VT, 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    Past Surgical History:  Procedure Laterality Date  . CARDIOVERSION    . COLONOSCOPY WITH PROPOFOL N/A 10/17/2015   Procedure: COLONOSCOPY WITH PROPOFOL;  Surgeon: Manya Silvas, MD;  Location: [facility] ENDOSCOPY;  Service: Endoscopy;  Laterality: N/A;  . ELECTROPHYSIOLOGIC STUDY N/A 12/07/2015   Procedure: V Tach Ablation;  Surgeon: Will Ewbank Leeds, MD;   Location: [place] CV LAB;  Service: Cardiovascular;  Laterality: N/A;  . JOINT REPLACEMENT     R TKR  . RADIOLOGY WITH ANESTHESIA N/A 05/18/2014   Procedure: RADIOLOGY WITH ANESTHESIA;  Surgeon: Rob Hickman, MD;  Location: [place];  Service: Radiology;  Laterality: N/A;  . REPLACEMENT TOTAL KNEE    . REPLACEMENT TOTAL KNEE Left   . VASECTOMY      Current Outpatient Prescriptions  Medication Sig Dispense Refill  . amiodarone (PACERONE) 200 MG tablet Take by mouth.    . Ascorbic Acid (VITAMIN C) 500 MG tablet Take 500 mg by mouth daily.      Marland Kitchen atorvastatin (LIPITOR) 10 MG tablet Take 10 mg by mouth daily.     . Cholecalciferol (VITAMIN D3) 1000 UNITS CAPS Take 1,000 Units by mouth daily.     Marland Kitchen ELIQUIS 5 MG TABS tablet TAKE ONE TABLET TWICE A DAY 60 tablet 5  . finasteride (PROSCAR) 5 MG tablet Take 1 tablet (5 mg total) by mouth daily. 90 tablet 3  . mexiletine (MEXITIL) 150 MG capsule TAKE 1 CAPSULE BY MOUTH TWICE DAILY 60 capsule 3  . Multiple Vitamin (MULTIVITAMIN WITH MINERALS) TABS tablet Take 1 tablet by mouth daily. One A Day 50+    . senna-docusate (SENOKOT-S) 8.6-50 MG tablet Take by mouth.    . tamsulosin (FLOMAX) 0.4 MG CAPS capsule Take 1 capsule (0.4 mg total) by mouth daily. Take two capsules daily 180 capsule 3  . torsemide (DEMADEX) 10 MG tablet Take 10 mg by mouth daily.      No current facility-administered medications for this visit.     Allergies  Allergen Reactions  . Ciprofloxacin Anaphylaxis  . Levofloxacin Anaphylaxis    "throat closes"  . Enalapril Maleate Swelling    Angioedema  . Metoprolol Swelling    edema  . Vasotec [Enalaprilat] Other (See Comments)    Reaction: Angioedema  . Sulfa Antibiotics Rash and Other (See Comments)    Hypotension    Review of Systems negative except from HPI and PMH  Physical Exam BP 120/70 (BP Location: Left Arm, Patient Position: Sitting, Cuff Size: Normal)   Pulse 64   Ht 5\' 6"  (1.676 m)   Wt 174 lb 12  oz (79.3 kg)   BMI 28.21 kg/m  Well developed and nourished in no acute distress HENT normal Neck supple with JVP-flat Carotids brisk and full without bruits Clear Regular rate and rhythm, no murmurs or gallops Abd-soft with active BS without hepatomegaly No Clubbing cyanosis edema Skin-warm and dry A & Oriented  Grossly normal sensory and motor function  Sinus rhythm at 64 21/11/42 Low-voltage    Assessment and  Plan  Ventricular tachycardia  Low-voltage ECG  Cardiomyopathy-nonischemic   Paroxysmal atrial fibrillation  Sinus bradycardia  High Risk Medication Surveillance,    No intercurrent atrial fibrillation or flutter  No intercurrent Ventricular tachycardia  On Anticoagulation;  No bleeding issues   Tolerating amio   Euvolemic continue current meds  Sinus brady  improved off amio

## 2017-02-21 NOTE — Patient Instructions (Signed)
Medication Instructions: - Your physician recommends that you continue on your current medications as directed. Please refer to the Current Medication list given to you today.  Labwork: - Your physician recommends that you have lab work today: Liver/ TSH  Procedures/Testing: - none ordered  Follow-Up: - Your physician wants you to follow-up in: 6 months with Dr. Caryl Comes. You will receive a reminder letter in the mail two months in advance. If you don't receive a letter, please call our office to schedule the follow-up appointment.   Any Additional Special Instructions Will Be Listed Below (If Applicable).     If you need a refill on your cardiac medications before your next appointment, please call your pharmacy.

## 2017-02-22 LAB — TSH: TSH: 7.5 u[IU]/mL — ABNORMAL HIGH (ref 0.450–4.500)

## 2017-02-22 LAB — HEPATIC FUNCTION PANEL
ALBUMIN: 4.5 g/dL (ref 3.5–4.8)
ALK PHOS: 91 IU/L (ref 39–117)
ALT: 17 IU/L (ref 0–44)
AST: 23 IU/L (ref 0–40)
BILIRUBIN TOTAL: 1.1 mg/dL (ref 0.0–1.2)
Bilirubin, Direct: 0.31 mg/dL (ref 0.00–0.40)
Total Protein: 6.9 g/dL (ref 6.0–8.5)

## 2017-02-25 ENCOUNTER — Telehealth: Payer: Self-pay

## 2017-02-25 MED ORDER — LEVOTHYROXINE SODIUM 25 MCG PO TABS: 25.0000 ug | ORAL_TABLET | Freq: Every day | ORAL | 2 refills | Status: DC

## 2017-02-25 NOTE — Telephone Encounter (Signed)
Pt is aware and agreeable to results. Patient is aware of new abnormality with Thyroid and agreeable to starting Synthroid 22mcg QD. Confirmed with patient that he wants RX sent to [facility] and that he would need to follow up with PCP Dr. Ouida Sills in regards to future treatment. Patient is aware that Dr. Ouida Sills has been sent a copy of labs. We are both agreeable to plan

## 2017-04-08 ENCOUNTER — Other Ambulatory Visit: Payer: Self-pay | Admitting: Internal Medicine

## 2017-05-09 ENCOUNTER — Other Ambulatory Visit: Payer: Self-pay | Admitting: Internal Medicine

## 2017-05-10 NOTE — Telephone Encounter (Signed)
Pt saw Dr Caryl Comes 02/21/17, last labs 08/08/16 Creat 1.0, age 76, weight 79.3, based on specified criteria pt is on appropriate dosage of Eliquis 5mg  BID.  Will refill rx.

## 2017-07-09 IMAGING — US US RENAL
1 series · 14 of 25 positions shown · non-contrast
Comparison: Abdominal CT 01/31/2015

CLINICAL DATA: Follow-up renal cysts. Left flank pain for 3 months
with microscopic hematuria

EXAM:
RENAL / URINARY TRACT ULTRASOUND COMPLETE

[Series 1: us renal · 0.30mm/px · 14 of 43 slices shown]
[im 1/43]
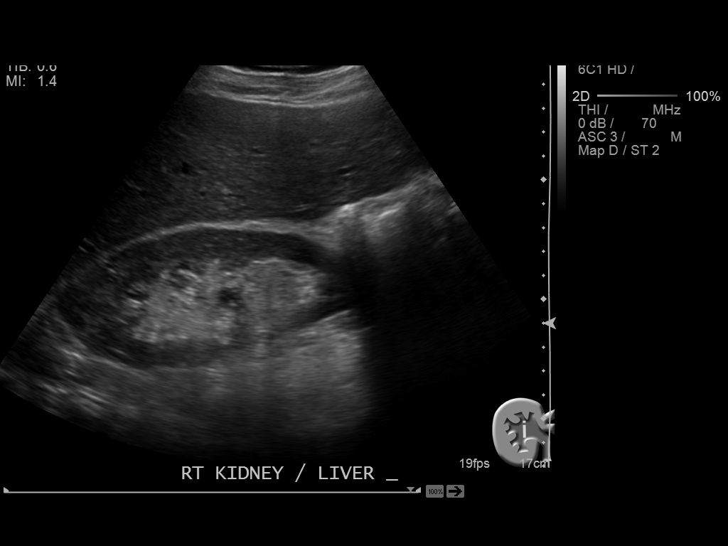
[im 4/43]
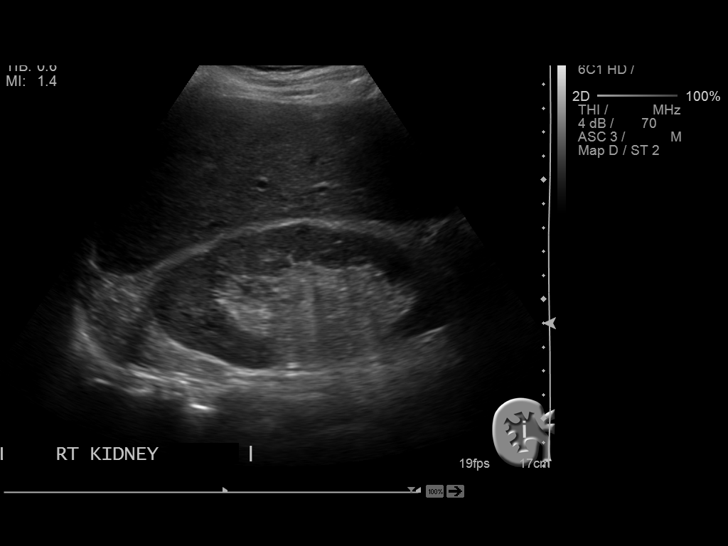
[im 8/43]
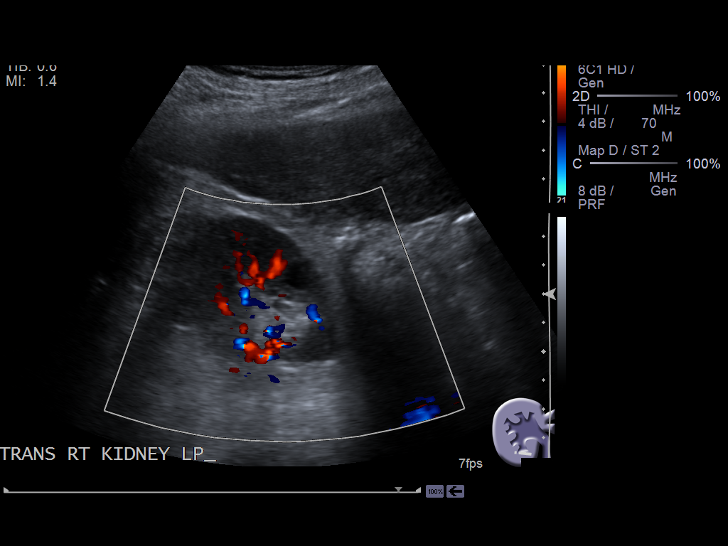
[im 11/43]
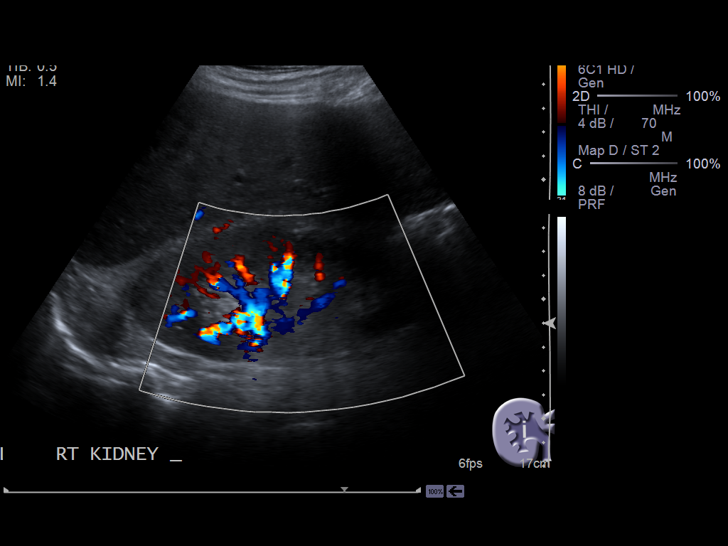
[im 15/43]
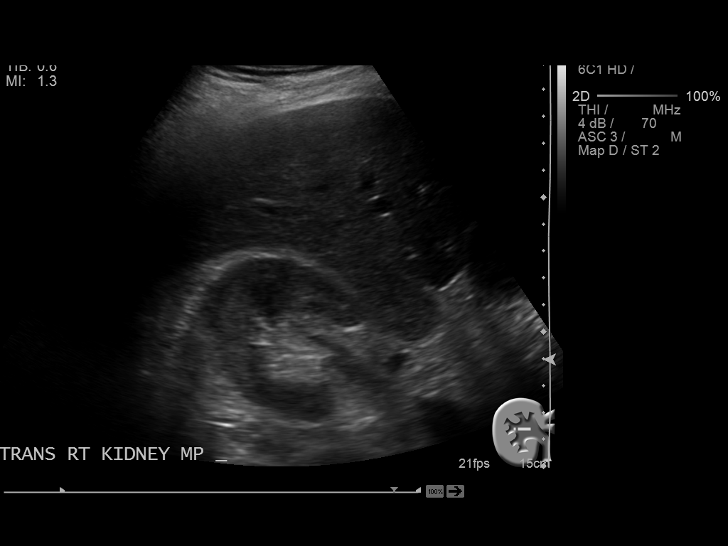
[im 16/43]
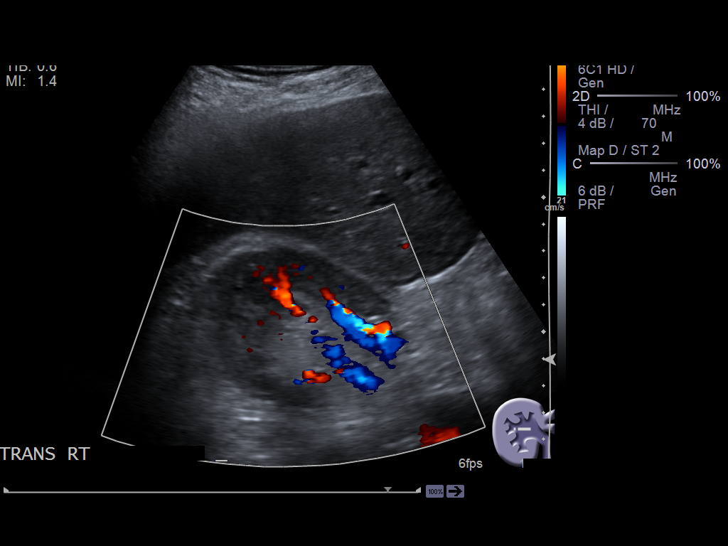
[im 20/43]
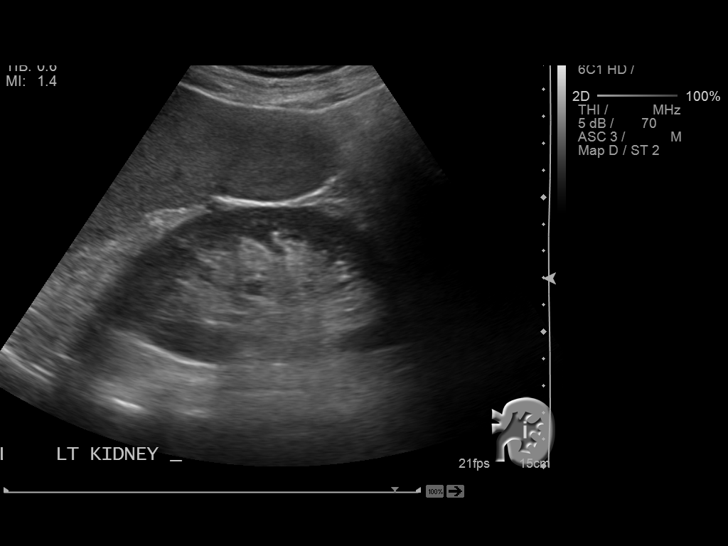
[im 23/43]
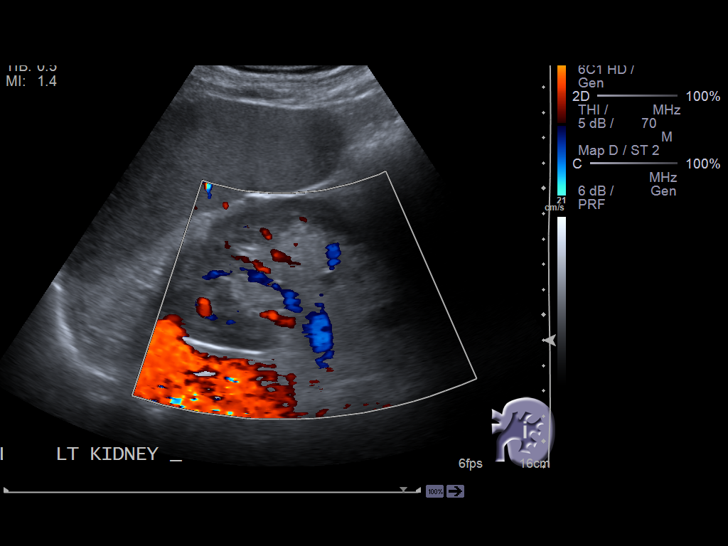
[im 27/43]
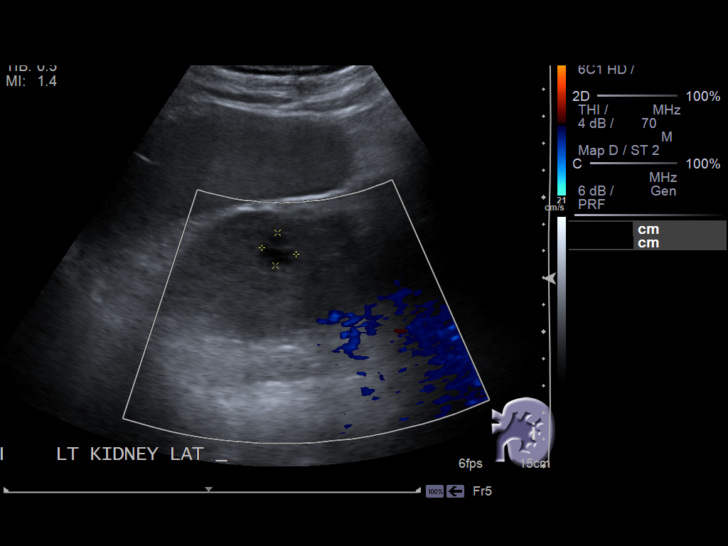
[im 29/43]
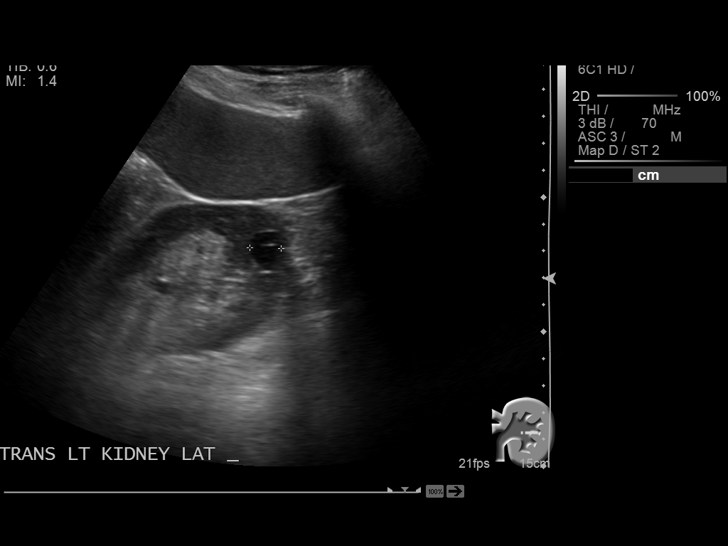
[im 32/43]
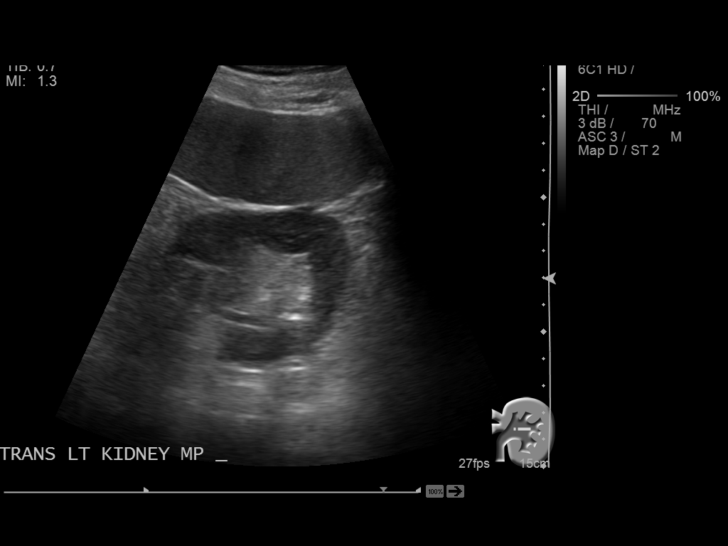
[im 36/43]
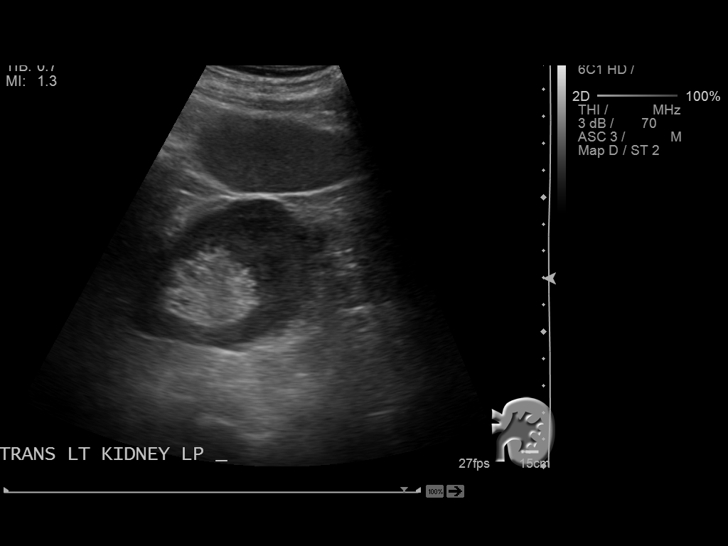
[im 39/43]
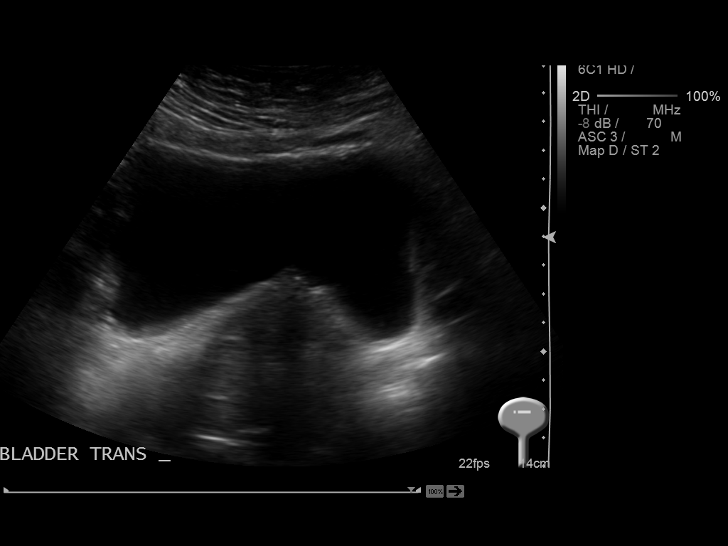
[im 43/43]
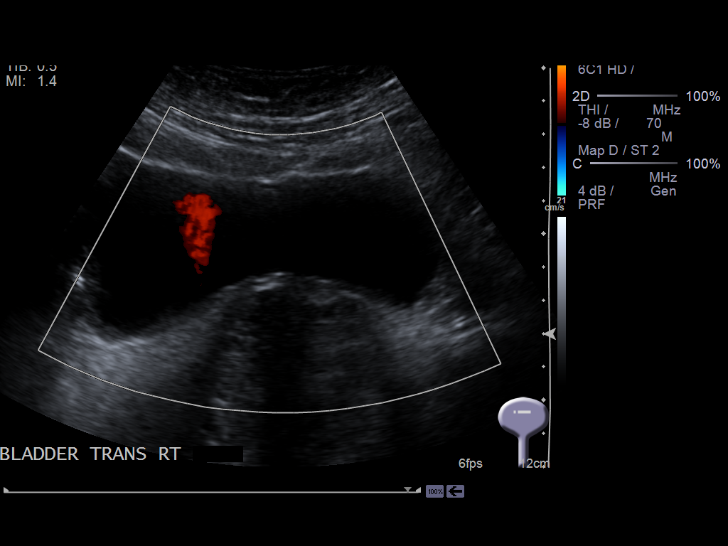

[14 of 25 positions shown; findings below may reference images not displayed]

FINDINGS: Right Kidney:

Length: 13 cm. Echogenicity within normal limits. No solid mass or
hydronephrosis. A 9 mm simple appearing cyst is present in the lower
pole cortex.

Left Kidney:

Length: 12 cm. Thinly septated cyst in the lower pole cortex
measures 16 mm. This lesion has minimally (if at all) enlarged by CT
since 0757. No hydronephrosis. Normal cortical echogenicity.

Bladder:

Thick walled bladder, likely chronic outlet obstruction in this
patient with large prostate, projecting into the bladder base.
IMPRESSION: 1. Thinly septated 16 mm cyst in the lower pole left kidney. Renal
MRI follow-up is recommended. Given minimal if any enlargement since
0757 CT, MRI could safely be delayed 6 to 12 months.
2. No solid or complex cystic lesions seen in the right kidney.
3. Prostatomegaly with bladder wall thickening consistent with
chronic outlet obstruction.

## 2017-08-22 ENCOUNTER — Encounter: Payer: Self-pay | Admitting: Internal Medicine

## 2017-08-22 ENCOUNTER — Ambulatory Visit: Payer: Medicare Other | Admitting: Internal Medicine

## 2017-08-22 VITALS — BP 122/62 | HR 66 | Ht 66.0 in | Wt 177.2 lb

## 2017-08-22 DIAGNOSIS — I480 Paroxysmal atrial fibrillation: Secondary | ICD-10-CM

## 2017-08-22 DIAGNOSIS — I428 Other cardiomyopathies: Secondary | ICD-10-CM

## 2017-08-22 DIAGNOSIS — I472 Ventricular tachycardia, unspecified: Secondary | ICD-10-CM

## 2017-08-22 DIAGNOSIS — Z79899 Other long term (current) drug therapy: Secondary | ICD-10-CM | POA: Diagnosis not present

## 2017-08-22 NOTE — Progress Notes (Signed)
Patient Care Team: Kirk Ruths, MD as PCP - General (Unknown Physician Specialty)   HPI  Austin Warren is a 77 y.o. male Seen in followup for a ventricular tachycardia with a right bundle superior axis relatively narrow QRS complex and positive concordance suggesting a septal origin and possible verapamil sensitivity. Catheterization had demonstrated normal coronary arteries and normal left ventricular function. ECG was notable for marked reduction in voltage   Signal average Electrocardiogram was markedly abnormal.  MRI 4/16>>There is no significant change from the prior study with persistent basal inferior and inferolateral late gadolinium enhancement slightly more pronounced on the current study and a small focal late gadolinium enhancement at the point of inferior RV attachment to the LV wall.  He also underwent fat pad biopsy>> neg  Echocardiogram 11/15 demonstrated left ventricular hypertrophy with an ejection fraction of 45-50% with hypokinesis of the inferior apical myocardium   We initially tried him on a beta blocker prescription. He had recurrent ventricular tachycardia. He was put on verapamil.   For a long time he had no significant recurrences of ventricular tachycardia. More recently he had recurrent tachycardia and was then started on mexiletine.  5/17 he was transferred to [facility] in [place]. Evaluation included an echo with EF of 40-45%. Cardiac MR demonstrated EF of 43% with inferolateral and apical hypokinesis with inferolateral basilar scar which was if anything not worse compared to prior MR 4/16. He underwent EP testing. No scar was found and no ablation undertaken. Ventricular flutter was inducible. VT apparently was not. There have been discussions regarding ICD implantation. He elected to defer that decision He was discharged on increased mexiletine. He was seen again 6/17 for recurrent ventricular tachycardia. ECG was reviewed. Right bundle superior axis at a  cycle length of just under 300 ms  6/17 started on amiodarone  Bradycardia required down titration of amiodarone now on combination with mexiletine for ventricular tachycardia     He  had  stroke  11/15. He was found to be in atrial fibrillation. He was treated with TPA. Apixaban was initiated.  He has intellectual consequences and aphasia;  This continues to improve   He is unaware of any atrial fibrillation or ventricular tachycardia   he is tolerating his amiodarone without difficulty.  Nausea sun sensitivity.  He does have a little bit of a cough he is also had a URI.  E.       Date Cr K TSH LFTs PFTs  6/17  0.94 3.9 2.8 25    8/18 0.9 4.0 7.5 16   1/19   4.99       DATE Rate PR interval QRSduration Dose-Amio  10/18 62  162 112 200  1/18 54 212 112 200  1/19  66  206  104  200 x 5/w            Past Medical History:  Diagnosis Date  . Arthritis   . Benign prostatic hypertrophy   . Bursitis of left shoulder   . CHF (congestive heart failure) ([place])   . Diabetes ([place])   . Dyspnea on exertion 11/01/2011  . Elevated PSA   . History of colon polyps   . History of colon polyps   . Hyperlipidemia   . Hypertension   . Melanoma ([place])    under arm  . Microscopic hematuria   . Osteoarthritis   . Retroperitoneal mass   . Stroke ([place]) 05/2014  . TIA (transient ischemic attack)   . Urinary  retention   . UTI (urinary tract infection)   . Ventricular tachycardia (HCC)    RBB/LAHB ideopathic VT, 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    Past Surgical History:  Procedure Laterality Date  . CARDIOVERSION    . COLONOSCOPY WITH PROPOFOL N/A 10/17/2015   Procedure: COLONOSCOPY WITH PROPOFOL;  Surgeon: Manya Silvas, MD;  Location: [facility] ENDOSCOPY;  Service: Endoscopy;  Laterality: N/A;  . ELECTROPHYSIOLOGIC STUDY N/A 12/07/2015   Procedure: V Tach Ablation;  Surgeon: Will Marines Leeds, MD;  Location: [place] CV LAB;  Service: Cardiovascular;  Laterality: N/A;  . JOINT REPLACEMENT      R TKR  . RADIOLOGY WITH ANESTHESIA N/A 05/18/2014   Procedure: RADIOLOGY WITH ANESTHESIA;  Surgeon: Rob Hickman, MD;  Location: [place];  Service: Radiology;  Laterality: N/A;  . REPLACEMENT TOTAL KNEE    . REPLACEMENT TOTAL KNEE Left   . VASECTOMY      Current Outpatient Medications  Medication Sig Dispense Refill  . amiodarone (PACERONE) 200 MG tablet Take by mouth.    . Ascorbic Acid (VITAMIN C) 500 MG tablet Take 500 mg by mouth daily.      Marland Kitchen atorvastatin (LIPITOR) 10 MG tablet Take 10 mg by mouth daily.     . Cholecalciferol (VITAMIN D3) 1000 UNITS CAPS Take 1,000 Units by mouth daily.     Marland Kitchen ELIQUIS 5 MG TABS tablet TAKE 1 TABLET BY MOUTH TWICE DAILY 60 tablet 6  . finasteride (PROSCAR) 5 MG tablet Take 1 tablet (5 mg total) by mouth daily. 90 tablet 3  . levothyroxine (SYNTHROID) 25 MCG tablet Take 1 tablet (25 mcg total) by mouth daily before breakfast. 30 tablet 2  . mexiletine (MEXITIL) 150 MG capsule TAKE 1 CAPSULE BY MOUTH TWICE A DAY 60 capsule 6  . Multiple Vitamin (MULTIVITAMIN WITH MINERALS) TABS tablet Take 1 tablet by mouth daily. One A Day 50+    . senna-docusate (SENOKOT-S) 8.6-50 MG tablet Take by mouth.    . tamsulosin (FLOMAX) 0.4 MG CAPS capsule Take 1 capsule (0.4 mg total) by mouth daily. Take two capsules daily 180 capsule 3  . torsemide (DEMADEX) 10 MG tablet Take 10 mg by mouth daily.      No current facility-administered medications for this visit.     Allergies  Allergen Reactions  . Ciprofloxacin Anaphylaxis  . Levofloxacin Anaphylaxis    "throat closes"  . Enalapril Maleate Swelling    Angioedema  . Metoprolol Swelling    edema  . Vasotec [Enalaprilat] Other (See Comments)    Reaction: Angioedema  . Sulfa Antibiotics Rash and Other (See Comments)    Hypotension    Review of Systems negative except from HPI and PMH  Physical Exam BP 122/62 (BP Location: Left Arm, Patient Position: Sitting, Cuff Size: Normal)   Pulse 66   Ht 5\' 6"   (1.676 m)   Wt 177 lb 4 oz (80.4 kg)   BMI 28.61 kg/m  Well developed and nourished in no acute distress HENT normal Neck supple with JVP-flat Clear Regular rate and rhythm, no murmurs or gallops Abd-soft with active BS No Clubbing cyanosis edema Skin-warm and dry A & Oriented  Grossly normal sensory and motor function  ECG sinus at 66 21/10/40 Low voltage Nonspecific T waves    Assessment and  Plan  Ventricular tachycardia  Low-voltage ECG  Cardiomyopathy-nonischemic abnormal signal average ECG/MRI negative fat pad biopsy  Paroxysmal atrial fibrillation  Sinus bradycardia  High Risk Medication Surveillance,  Continue to worry  about the mechanism of his cardiomyopathy with low voltage abnormal signal average and MR.  Sensitivity a fat pad biopsy may be an adequate.  We will review the issues related to alternative diagnosis for amyloidosis   On Anticoagulation;  No bleeding issues  Will check CBC   No intercurrent Ventricular tachycardia  No intercurrent atrial fibrillation or flutter  Euvolemic continue current meds  Tolerating amio without difficulty  Will check LFTs

## 2017-08-22 NOTE — Patient Instructions (Addendum)
Medication Instructions:  Your physician recommends that you continue on your current medications as directed. Please refer to the Current Medication list given to you today.  Labwork: Your physician recommends that you have the following labs drawn: CMET, CBC  Testing/Procedures: None ordered.  Follow-Up: Your physician recommends that you schedule a follow-up appointment in: 6 months with Dr Caryl Comes  Any Other Special Instructions Will Be Listed Below (If Applicable).     If you need a refill on your cardiac medications before your next appointment, please call your pharmacy.

## 2017-08-23 LAB — COMPREHENSIVE METABOLIC PANEL
ALK PHOS: 89 IU/L (ref 39–117)
ALT: 27 IU/L (ref 0–44)
AST: 25 IU/L (ref 0–40)
Albumin/Globulin Ratio: 1.9 (ref 1.2–2.2)
Albumin: 4.5 g/dL (ref 3.5–4.8)
BUN/Creatinine Ratio: 13 (ref 10–24)
BUN: 11 mg/dL (ref 8–27)
Bilirubin Total: 1.1 mg/dL (ref 0.0–1.2)
CALCIUM: 8.9 mg/dL (ref 8.6–10.2)
CO2: 24 mmol/L (ref 20–29)
CREATININE: 0.88 mg/dL (ref 0.76–1.27)
Chloride: 102 mmol/L (ref 96–106)
GFR calc Af Amer: 94 mL/min/{1.73_m2} (ref >59)
GFR calc non Af Amer: 82 mL/min/{1.73_m2} (ref >59)
GLUCOSE: 111 mg/dL — AB (ref 65–99)
Globulin, Total: 2.4 g/dL (ref 1.5–4.5)
Potassium: 4.5 mmol/L (ref 3.5–5.2)
Sodium: 140 mmol/L (ref 134–144)
Total Protein: 6.9 g/dL (ref 6.0–8.5)

## 2017-08-23 LAB — CBC WITH DIFFERENTIAL/PLATELET
BASOS ABS: 0 10*3/uL (ref 0.0–0.2)
Basos: 1 %
EOS (ABSOLUTE): 0.1 10*3/uL (ref 0.0–0.4)
Eos: 3 %
HEMOGLOBIN: 14.4 g/dL (ref 13.0–17.7)
Hematocrit: 41.5 % (ref 37.5–51.0)
IMMATURE GRANULOCYTES: 0 %
Immature Grans (Abs): 0 10*3/uL (ref 0.0–0.1)
Lymphocytes Absolute: 1 10*3/uL (ref 0.7–3.1)
Lymphs: 25 %
MCH: 32.7 pg (ref 26.6–33.0)
MCHC: 34.7 g/dL (ref 31.5–35.7)
MCV: 94 fL (ref 79–97)
MONOCYTES: 7 %
MONOS ABS: 0.3 10*3/uL (ref 0.1–0.9)
NEUTROS PCT: 64 %
Neutrophils Absolute: 2.7 10*3/uL (ref 1.4–7.0)
Platelets: 111 10*3/uL — ABNORMAL LOW (ref 150–379)
RBC: 4.41 x10E6/uL (ref 4.14–5.80)
RDW: 14.2 % (ref 12.3–15.4)
WBC: 4.1 10*3/uL (ref 3.4–10.8)

## 2017-09-02 ENCOUNTER — Other Ambulatory Visit: Payer: Self-pay | Admitting: *Deleted

## 2017-09-02 MED ORDER — AMIODARONE HCL 200 MG PO TABS: 200.0000 mg | ORAL_TABLET | ORAL | 3 refills | Status: DC

## 2017-09-12 ENCOUNTER — Telehealth (HOSPITAL_COMMUNITY): Payer: Self-pay

## 2017-09-12 NOTE — Telephone Encounter (Signed)
Called to schedule f/u mri. Pt's wife will speak with husband and call me back if they decide to schedule. AW

## 2017-10-07 ENCOUNTER — Other Ambulatory Visit: Payer: Self-pay | Admitting: Internal Medicine

## 2017-11-06 ENCOUNTER — Other Ambulatory Visit: Payer: Self-pay | Admitting: Internal Medicine

## 2017-11-11 ENCOUNTER — Other Ambulatory Visit: Payer: Self-pay | Admitting: Urology

## 2017-11-11 DIAGNOSIS — N138 Other obstructive and reflux uropathy: Secondary | ICD-10-CM

## 2017-11-11 DIAGNOSIS — N401 Enlarged prostate with lower urinary tract symptoms: Principal | ICD-10-CM

## 2017-12-11 NOTE — Progress Notes (Signed)
12/12/2017 10:34 AM   [name] 22-Mar-1941 [id number]  Referring provider: Kirk Ruths, MD [facility] [place], [place] [address]  Chief Complaint  Patient presents with  . Benign Prostatic Hypertrophy    HPI: Patient is a 77 year old Caucasian male with a history of elevated PSA, history of hematuria, and BPH with LUTS who presents today for a 1 year follow-up with his wife, [name].    History of elevated PSA Patient underwent a biopsy of his prostate in 2005 for a PSA elevation of 5.5.  The results were benign.  He was initiated on finasteride 5 mg daily. His most recent PSA was 0.9 on 12/10/2014.  Due to his age and AUA guidelines, it was decided to discontinue PSA screenings as his last several PSAs have returned below 3.  We will continue annual rectal exams and if an abnormal finding is discovered, we will have further discussions at that time.  Microscopic hematuria Patient underwent a hematuria workup in 2016 and was found to have an enlarged prostate and a Bosniak 2 left lower pole cyst.  He does not report any gross hematuria over the last.    BPH WITH LUTS His IPSS score today is 9, which is moderate lower urinary tract symptomatology.  He is mostly satisfied with his quality life due to his urinary symptoms.  His previous IPSS score was 12/1.  His previous PVR is 199 mL.  His major complaint today is nocturia x 2, intermittency and hesitancy.  He has had these symptoms for several years.  He denies any dysuria, hematuria or suprapubic pain.  He currently taking tamsulosin 0.4 mg daily and finasteride 5 mg daily.  His has had cystoscopy on 05/03/2015 and found to have an enlarged prostate, estimated size greater than 100 g, significant trabeculation and left small Hutch diverticuli without masses.  He also denies any recent fevers, chills, nausea or vomiting.  He does not have a family history of PCa.   IPSS    Row Name  12/12/17 1000         International Prostate Symptom Score   How often have you had the sensation of not emptying your bladder?  Less than 1 in 5     How often have you had to urinate less than every two hours?  Not at All     How often have you found you stopped and started again several times when you urinated?  About half the time     How often have you found it difficult to postpone urination?  Less than 1 in 5 times     How often have you had a weak urinary stream?  Less than half the time     How often have you had to strain to start urination?  Not at All     How many times did you typically get up at night to urinate?  2 Times     Total IPSS Score  9       Quality of Life due to urinary symptoms   If you were to spend the rest of your life with your urinary condition just the way it is now how would you feel about that?  Mostly Satisfied        Score:  1-7 Mild 8-19 Moderate 20-35 Severe  PMH: Past Medical History:  Diagnosis Date  . Arthritis   . Benign prostatic hypertrophy   . Bursitis of left  shoulder   . CHF (congestive heart failure) ([place])   . Diabetes ([place])   . Dyspnea on exertion 11/01/2011  . Elevated PSA   . History of colon polyps   . History of colon polyps   . Hyperlipidemia   . Hypertension   . Melanoma ()    under arm  . Microscopic hematuria   . Osteoarthritis   . Retroperitoneal mass   . Stroke ([place]) 05/2014  . TIA (transient ischemic attack)   . Urinary retention   . UTI (urinary tract infection)   . Ventricular tachycardia (HCC)    RBB/LAHB ideopathic VT, 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    Surgical History: Past Surgical History:  Procedure Laterality Date  . CARDIOVERSION    . COLONOSCOPY WITH PROPOFOL N/A 10/17/2015   Procedure: COLONOSCOPY WITH PROPOFOL;  Surgeon: Manya Silvas, MD;  Location: [facility] ENDOSCOPY;  Service: Endoscopy;  Laterality: N/A;  . ELECTROPHYSIOLOGIC STUDY N/A 12/07/2015   Procedure: V Tach Ablation;  Surgeon:  Will Chrystal Leeds, MD;  Location: [place] CV LAB;  Service: Cardiovascular;  Laterality: N/A;  . JOINT REPLACEMENT     R TKR  . RADIOLOGY WITH ANESTHESIA N/A 05/18/2014   Procedure: RADIOLOGY WITH ANESTHESIA;  Surgeon: Rob Hickman, MD;  Location: [place];  Service: Radiology;  Laterality: N/A;  . REPLACEMENT TOTAL KNEE    . REPLACEMENT TOTAL KNEE Left   . VASECTOMY      Home Medications:  Allergies as of 12/12/2017      Reactions   Ciprofloxacin Anaphylaxis   Levofloxacin Anaphylaxis   "throat closes"   Enalapril Maleate Swelling   Angioedema   Metoprolol Swelling   edema   Vasotec [enalaprilat] Other (See Comments)   Reaction: Angioedema   Sulfa Antibiotics Rash, Other (See Comments)   Hypotension      Medication List        Accurate as of 12/12/17 10:34 AM. Always use your most recent med list.          amiodarone 200 MG tablet Commonly known as:  PACERONE Take 1 tablet (200 mg total) by mouth as directed. Mon-Friday.   atorvastatin 10 MG tablet Commonly known as:  LIPITOR Take 10 mg by mouth daily.   ELIQUIS 5 MG Tabs tablet Generic drug:  apixaban TAKE ONE TABLET TWICE DAILY   finasteride 5 MG tablet Commonly known as:  PROSCAR TAKE ONE TABLET EVERY DAY   levothyroxine 50 MCG tablet Commonly known as:  SYNTHROID, LEVOTHROID Take 1 tablet by mouth daily.   mexiletine 150 MG capsule Commonly known as:  MEXITIL TAKE 1 CAPSULE BY MOUTH TWICE DAILY   multivitamin with minerals Tabs tablet Take 1 tablet by mouth daily. One A Day 50+   tamsulosin 0.4 MG Caps capsule Commonly known as:  FLOMAX Take 1 capsule (0.4 mg total) by mouth daily. Take two capsules daily   torsemide 10 MG tablet Commonly known as:  DEMADEX Take 10 mg by mouth daily.   vitamin C 500 MG tablet Commonly known as:  ASCORBIC ACID Take 500 mg by mouth daily.   Vitamin D3 1000 units Caps Take 1,000 Units by mouth daily.       Allergies:  Allergies  Allergen  Reactions  . Ciprofloxacin Anaphylaxis  . Levofloxacin Anaphylaxis    "throat closes"  . Enalapril Maleate Swelling    Angioedema  . Metoprolol Swelling    edema  . Vasotec [Enalaprilat] Other (See Comments)    Reaction: Angioedema  . Sulfa  Antibiotics Rash and Other (See Comments)    Hypotension    Family History: Family History  Problem Relation Age of Onset  . Stroke Father   . Hypertension Father   . Heart attack Neg Hx   . Kidney disease Neg Hx   . Prostate cancer Neg Hx   . Kidney cancer Neg Hx   . Bladder Cancer Neg Hx     Social History:  reports that he quit smoking about 47 years ago. His smoking use included cigarettes. He started smoking about 57 years ago. He has a 20.00 pack-year smoking history. He has never used smokeless tobacco. He reports that he drinks alcohol. He reports that he does not use drugs.  ROS: UROLOGY Frequent Urination?: No Hard to postpone urination?: No Burning/pain with urination?: No Get up at night to urinate?: Yes Leakage of urine?: No Urine stream starts and stops?: Yes Trouble starting stream?: Yes Do you have to strain to urinate?: No Blood in urine?: No Urinary tract infection?: No Sexually transmitted disease?: No Injury to kidneys or bladder?: No Painful intercourse?: No Weak stream?: No Erection problems?: No Penile pain?: No  Gastrointestinal Nausea?: No Vomiting?: No Indigestion/heartburn?: No Diarrhea?: No Constipation?: No  Constitutional Fever: No Night sweats?: No Weight loss?: No Fatigue?: No  Skin Skin rash/lesions?: No Itching?: No  Eyes Blurred vision?: No Double vision?: No  Ears/Nose/Throat Sore throat?: No Sinus problems?: No  Hematologic/Lymphatic Swollen glands?: No Easy bruising?: No  Cardiovascular Leg swelling?: No Chest pain?: No  Respiratory Cough?: No Shortness of breath?: No  Endocrine Excessive thirst?: No  Musculoskeletal Back pain?: No Joint pain?:  No  Neurological Headaches?: No Dizziness?: No  Psychologic Depression?: No Anxiety?: No  Physical Exam: BP 134/66   Pulse 61   Ht 5' 6"  (1.676 m)   Wt 177 lb (80.3 kg)   BMI 28.57 kg/m   Constitutional: Well nourished. Alert and oriented, No acute distress. HEENT: [place] AT, moist mucus membranes. Trachea midline, no masses. Cardiovascular: No clubbing, cyanosis, or edema. Respiratory: Normal respiratory effort, no increased work of breathing. GI: Abdomen is soft, non tender, non distended, no abdominal masses. Liver and spleen not palpable.  No hernias appreciated.  Stool sample for occult testing is not indicated.   GU: No CVA tenderness.  No bladder fullness or masses.  Patient with circumcised phallus.   Urethral meatus is patent.  No penile discharge. No penile lesions or rashes. Scrotum without lesions, cysts, rashes and/or edema.  Testicles are located scrotally bilaterally. No masses are appreciated in the testicles. Left and right epididymis are normal. Rectal: Patient with  normal sphincter tone. Anus and perineum without scarring or rashes. No rectal masses are appreciated. Prostate is approximately 70 grams, no nodules are appreciated. Seminal vesicles are normal. Skin: No rashes, bruises or suspicious lesions. Lymph: No cervical or inguinal adenopathy. Neurologic: Grossly intact, no focal deficits, moving all 4 extremities. Psychiatric: Normal mood and affect.   Laboratory Data: Lab Results  Component Value Date   WBC 4.1 08/22/2017   HGB 14.4 08/22/2017   HCT 41.5 08/22/2017   MCV 94 08/22/2017   PLT 111 (L) 08/22/2017    Lab Results  Component Value Date   CREATININE 0.88 08/22/2017   PSA History:  5.5 ng/mL on 10/00/2005-bx was benign  1.3 ng/mL on 00/00/2013  1.4 ng/mL on 11/26/2012  1.6 ng/mL on 11/26/2013  0.9 ng/mL on 12/10/2014   Lab Results  Component Value Date   HGBA1C 6.3 (H) 05/19/2014  Lab Results  Component Value Date   TSH 7.500  (H) 02/21/2017       Component Value Date/Time   CHOL 124 05/19/2014 0510   HDL 38 (L) 05/19/2014 0510   CHOLHDL 3.3 05/19/2014 0510   VLDL 28 05/19/2014 0510   LDLCALC 58 05/19/2014 0510    Lab Results  Component Value Date   AST 25 08/22/2017   Lab Results  Component Value Date   ALT 27 08/22/2017    Results for orders placed or performed in visit on 08/22/17  CBC with Differential/Platelet  Result Value Ref Range   WBC 4.1 3.4 - 10.8 x10E3/uL   RBC 4.41 4.14 - 5.80 x10E6/uL   Hemoglobin 14.4 13.0 - 17.7 g/dL   Hematocrit 41.5 37.5 - 51.0 %   MCV 94 79 - 97 fL   MCH 32.7 26.6 - 33.0 pg   MCHC 34.7 31.5 - 35.7 g/dL   RDW 14.2 12.3 - 15.4 %   Platelets 111 (L) 150 - 379 x10E3/uL   Neutrophils 64 Not Estab. %   Lymphs 25 Not Estab. %   Monocytes 7 Not Estab. %   Eos 3 Not Estab. %   Basos 1 Not Estab. %   Neutrophils Absolute 2.7 1.4 - 7.0 x10E3/uL   Lymphocytes Absolute 1.0 0.7 - 3.1 x10E3/uL   Monocytes Absolute 0.3 0.1 - 0.9 x10E3/uL   EOS (ABSOLUTE) 0.1 0.0 - 0.4 x10E3/uL   Basophils Absolute 0.0 0.0 - 0.2 x10E3/uL   Immature Granulocytes 0 Not Estab. %   Immature Grans (Abs) 0.0 0.0 - 0.1 x10E3/uL  Comp Met (CMET)  Result Value Ref Range   Glucose 111 (H) 65 - 99 mg/dL   BUN 11 8 - 27 mg/dL   Creatinine, Ser 0.88 0.76 - 1.27 mg/dL   GFR calc non Af Amer 82 >59 mL/min/1.73   GFR calc Af Amer 94 >59 mL/min/1.73   BUN/Creatinine Ratio 13 10 - 24   Sodium 140 134 - 144 mmol/L   Potassium 4.5 3.5 - 5.2 mmol/L   Chloride 102 96 - 106 mmol/L   CO2 24 20 - 29 mmol/L   Calcium 8.9 8.6 - 10.2 mg/dL   Total Protein 6.9 6.0 - 8.5 g/dL   Albumin 4.5 3.5 - 4.8 g/dL   Globulin, Total 2.4 1.5 - 4.5 g/dL   Albumin/Globulin Ratio 1.9 1.2 - 2.2   Bilirubin Total 1.1 0.0 - 1.2 mg/dL   Alkaline Phosphatase 89 39 - 117 IU/L   AST 25 0 - 40 IU/L   ALT 27 0 - 44 IU/L   I have reviewed the labs.  Assessment & Plan:    1. History of hematuria:    Patient underwent  hematuria workup in 2016. He was found to have BPH and Bosniak #2 cyst. He has not had any gross hematuria over the last year.   We will continue to monitor.   2. BPH with LUTS:  -IPSS score is 9/2, it is stable  -Continue tamsulosin 0.4 mg bid and finasteride 5 mg daily; refills given  -RTC in 12 months for IPSS score and exam     Return in about 1 year (around 12/13/2018) for I PSS and exam.  These notes generated with voice recognition software. I apologize for typographical errors.  Zara Council, PA-C  [facility] [address] [place], [place] [address] [phone]

## 2017-12-12 ENCOUNTER — Encounter: Payer: Self-pay | Admitting: Urology

## 2017-12-12 ENCOUNTER — Ambulatory Visit: Payer: Medicare Other | Admitting: Urology

## 2017-12-12 VITALS — BP 134/66 | HR 61 | Ht 66.0 in | Wt 177.0 lb

## 2017-12-12 DIAGNOSIS — Z87448 Personal history of other diseases of urinary system: Secondary | ICD-10-CM

## 2017-12-12 DIAGNOSIS — N138 Other obstructive and reflux uropathy: Secondary | ICD-10-CM | POA: Diagnosis not present

## 2017-12-12 DIAGNOSIS — N401 Enlarged prostate with lower urinary tract symptoms: Secondary | ICD-10-CM

## 2017-12-12 MED ORDER — TAMSULOSIN HCL 0.4 MG PO CAPS: 0.4000 mg | ORAL_CAPSULE | Freq: Every day | ORAL | 3 refills | Status: DC

## 2018-02-17 ENCOUNTER — Other Ambulatory Visit: Payer: Self-pay | Admitting: Internal Medicine

## 2018-02-27 ENCOUNTER — Ambulatory Visit: Payer: Medicare Other | Admitting: Internal Medicine

## 2018-02-27 ENCOUNTER — Encounter: Payer: Self-pay | Admitting: Internal Medicine

## 2018-02-27 VITALS — BP 136/76 | HR 67 | Ht 66.0 in | Wt 178.5 lb

## 2018-02-27 DIAGNOSIS — R001 Bradycardia, unspecified: Secondary | ICD-10-CM | POA: Diagnosis not present

## 2018-02-27 DIAGNOSIS — I472 Ventricular tachycardia, unspecified: Secondary | ICD-10-CM

## 2018-02-27 DIAGNOSIS — I480 Paroxysmal atrial fibrillation: Secondary | ICD-10-CM

## 2018-02-27 DIAGNOSIS — I428 Other cardiomyopathies: Secondary | ICD-10-CM

## 2018-02-27 DIAGNOSIS — Z79899 Other long term (current) drug therapy: Secondary | ICD-10-CM

## 2018-02-27 NOTE — H&P (View-Only) (Signed)
Patient Care Team: Kirk Ruths, MD as PCP - General (Unknown Physician Specialty)   HPI  Austin Warren is a 77 y.o. male Seen in followup for a ventricular tachycardia with a right bundle superior axis relatively narrow QRS complex and positive concordance suggesting a septal origin and possible verapamil sensitivity. Catheterization had demonstrated normal coronary arteries and normal left ventricular function. ECG was notable for marked reduction in voltage   Signal average Electrocardiogram was markedly abnormal.  MRI 4/16>>There is no significant change from the prior study with persistent basal inferior and inferolateral late gadolinium enhancement slightly more pronounced on the current study and a small focal late gadolinium enhancement at the point of inferior RV attachment to the LV wall.  He also underwent fat pad biopsy>> neg  Echocardiogram 11/15 demonstrated left ventricular hypertrophy with an ejection fraction of 45-50% with hypokinesis of the inferior apical myocardium   We initially tried him on a beta blocker prescription. He had recurrent ventricular tachycardia. He was put on verapamil.   For a long time he had no significant recurrences of ventricular tachycardia. More recently he had recurrent tachycardia and was then started on mexiletine.  5/17 he was transferred to [facility] in [place]. Evaluation included an echo with EF of 40-45%. Cardiac MR demonstrated EF of 43% with inferolateral and apical hypokinesis with inferolateral basilar scar which was if anything not worse compared to prior MR 4/16. He underwent EP testing. No scar was found and no ablation undertaken. Ventricular flutter was inducible. VT apparently was not. There have been discussions regarding ICD implantation. He elected to defer that decision He was discharged on increased mexiletine. He was seen again 6/17 for recurrent ventricular tachycardia. ECG was reviewed. Right bundle superior axis at a  cycle length of just under 300 ms  6/17 started on amiodarone  Bradycardia required down titration of amiodarone now on combination with mexiletine for ventricular tachycardia   He  had  stroke  11/15. He was found to be in atrial fibrillation. He was treated with TPA. Apixaban was initiated.  He has intellectual consequences and aphasia; these are improving  Denies chest pain or shortness of breath or peripheral edema  No bleeding  Patient denies symptoms of GI intolerance, sun sensitivity, neurological symptoms attributable to amiodarone.  Surveillance laboratories were in normal limits when checked 6 months  ago   Date Cr K TSH LFTs PFTs  6/17  0.94 3.9 2.8 25    8/18 0.9 4.0 7.5 16   1/19   4.99    2/19 0.8 4.0 3.9 18             DATE Rate PR interval QRSduration Dose-Amio  10/18 62  162 112 200  1/18 54 212 112 200  1/19  66  206  104  200 x 5/w            Past Medical History:  Diagnosis Date  . Arthritis   . Benign prostatic hypertrophy   . Bursitis of left shoulder   . CHF (congestive heart failure) ([place])   . Diabetes ([place])   . Dyspnea on exertion 11/01/2011  . Elevated PSA   . History of colon polyps   . History of colon polyps   . Hyperlipidemia   . Hypertension   . Melanoma ([place])    under arm  . Microscopic hematuria   . Osteoarthritis   . Retroperitoneal mass   . Stroke ([place]) 05/2014  . TIA (transient ischemic attack)   .  Urinary retention   . UTI (urinary tract infection)   . Ventricular tachycardia (HCC)    RBB/LAHB ideopathic VT, 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    Past Surgical History:  Procedure Laterality Date  . CARDIOVERSION    . COLONOSCOPY WITH PROPOFOL N/A 10/17/2015   Procedure: COLONOSCOPY WITH PROPOFOL;  Surgeon: Manya Silvas, MD;  Location: [facility] ENDOSCOPY;  Service: Endoscopy;  Laterality: N/A;  . ELECTROPHYSIOLOGIC STUDY N/A 12/07/2015   Procedure: V Tach Ablation;  Surgeon: Will Dexheimer Leeds, MD;  Location: [place] CV LAB;   Service: Cardiovascular;  Laterality: N/A;  . JOINT REPLACEMENT     R TKR  . RADIOLOGY WITH ANESTHESIA N/A 05/18/2014   Procedure: RADIOLOGY WITH ANESTHESIA;  Surgeon: Rob Hickman, MD;  Location: [place];  Service: Radiology;  Laterality: N/A;  . REPLACEMENT TOTAL KNEE    . REPLACEMENT TOTAL KNEE Left   . VASECTOMY      Current Outpatient Medications  Medication Sig Dispense Refill  . amiodarone (PACERONE) 200 MG tablet TAKE ONE TABLET EVERY DAY AS DIRECTED MON-FRI 30 tablet 1  . Ascorbic Acid (VITAMIN C) 500 MG tablet Take 500 mg by mouth daily.      Marland Kitchen atorvastatin (LIPITOR) 10 MG tablet Take 10 mg by mouth daily.     . Cholecalciferol (VITAMIN D3) 1000 UNITS CAPS Take 1,000 Units by mouth daily.     Marland Kitchen ELIQUIS 5 MG TABS tablet TAKE ONE TABLET TWICE DAILY 60 tablet 6  . finasteride (PROSCAR) 5 MG tablet TAKE ONE TABLET EVERY DAY 90 tablet 3  . levothyroxine (SYNTHROID, LEVOTHROID) 50 MCG tablet Take 1 tablet by mouth daily.    Marland Kitchen mexiletine (MEXITIL) 150 MG capsule TAKE 1 CAPSULE BY MOUTH TWICE DAILY 60 capsule 3  . Multiple Vitamin (MULTIVITAMIN WITH MINERALS) TABS tablet Take 1 tablet by mouth daily. One A Day 50+    . tamsulosin (FLOMAX) 0.4 MG CAPS capsule Take 1 capsule (0.4 mg total) by mouth daily. Take two capsules daily 180 capsule 3  . torsemide (DEMADEX) 10 MG tablet Take 10 mg by mouth daily.      No current facility-administered medications for this visit.     Allergies  Allergen Reactions  . Ciprofloxacin Anaphylaxis  . Levofloxacin Anaphylaxis    "throat closes"  . Enalapril Maleate Swelling    Angioedema  . Metoprolol Swelling    edema  . Vasotec [Enalaprilat] Other (See Comments)    Reaction: Angioedema  . Sulfa Antibiotics Rash and Other (See Comments)    Hypotension    Review of Systems negative except from HPI and PMH  Physical Exam BP 136/76 (BP Location: Left Arm, Patient Position: Sitting, Cuff Size: Normal)   Pulse 67   Ht 5\' 6"  (1.676 m)    Wt 178 lb 8 oz (81 kg)   BMI 28.81 kg/m  Well developed and nourished in no acute distress HENT normal Neck supple with JVP-flat Clear Regular rate and rhythm, no murmurs or gallops Abd-soft with active BS No Clubbing cyanosis edema Skin-warm and dry A & Oriented  Grossly normal sensory and motor function   ECG atrial fibrillation at 67 Intervals-/11/39    Assessment and  Plan  Ventricular tachycardia  Low-voltage ECG  Cardiomyopathy-nonischemic abnormal signal average ECG/MRI negative fat pad biopsy  Persistent atrial fibrillation  Stroke   Sinus bradycardia  Hypothyroidism-iatrogenic-treated  High Risk Medication Surveillance,   No intercurrent Ventricular tachycardia  On Anticoagulation;  No bleeding issues   Euvolemic continue current meds  Persistent atrial fibrillation.  We have discussed sinus rhythm versus undertaking cardioversion.  We have elected to pursue the latter.  He is not sure whether he is symptomatic related to his atrial fibrillation so he is wife will be attuned to whether he is better following cardioversion.  This would inform decision-making regarding cardioversion in the future.  We spent more than 50% of our >25 min visit in face to face counseling regarding the above  We will check amiodarone surveillance laboratories today including TSH on thyroid replacement; it has not been checked in some months and he may actually be hyperthyroid.

## 2018-02-27 NOTE — Patient Instructions (Addendum)
Medication Instructions: - Your physician recommends that you continue on your current medications as directed. Please refer to the Current Medication list given to you today.  Labwork: - Your physician recommends that you have lab work today: CMET/ CBC  Procedures/Testing: - Your physician has recommended that you have a Cardioversion (DCCV). Electrical Cardioversion uses a jolt of electricity to your heart either through paddles or wired patches attached to your chest. This is a controlled, usually prescheduled, procedure. Defibrillation is done under light anesthesia in the hospital, and you usually go home the day of the procedure. This is done to get your heart back into a normal rhythm. You are not awake for the procedure.   - You are scheduled for a Cardioversion on Friday 03/07/18 with Dr. Fletcher Anon Please arrive at the [facility] at 6:30 a.m. on the day of your procedure.  DIET INSTRUCTIONS:  Nothing to eat or drink after midnight except your medications with a sip of water.  1) Labs: done today (8/15)  2) Medications:  You may take of your regular morning medications with a small amount of water the day of your procedure, EXCEPT hold torsemide that morning  3) Must have a responsible person to drive you home.  4) Bring a current list of your medications and current insurance cards.    If you have any questions after you get home, please call the office at [phone]   Follow-Up: - Your physician recommends that you schedule a follow-up appointment in: 5 weeks with Dr. Caryl Comes (post Cardioversion).    Any Additional Special Instructions Will Be Listed Below (If Applicable).     If you need a refill on your cardiac medications before your next appointment, please call your pharmacy.

## 2018-02-27 NOTE — Progress Notes (Signed)
Patient Care Team: Kirk Ruths, MD as PCP - General (Unknown Physician Specialty)   HPI  Austin Warren is a 77 y.o. male Seen in followup for a ventricular tachycardia with a right bundle superior axis relatively narrow QRS complex and positive concordance suggesting a septal origin and possible verapamil sensitivity. Catheterization had demonstrated normal coronary arteries and normal left ventricular function. ECG was notable for marked reduction in voltage   Signal average Electrocardiogram was markedly abnormal.  MRI 4/16>>There is no significant change from the prior study with persistent basal inferior and inferolateral late gadolinium enhancement slightly more pronounced on the current study and a small focal late gadolinium enhancement at the point of inferior RV attachment to the LV wall.  He also underwent fat pad biopsy>> neg  Echocardiogram 11/15 demonstrated left ventricular hypertrophy with an ejection fraction of 45-50% with hypokinesis of the inferior apical myocardium   We initially tried him on a beta blocker prescription. He had recurrent ventricular tachycardia. He was put on verapamil.   For a long time he had no significant recurrences of ventricular tachycardia. More recently he had recurrent tachycardia and was then started on mexiletine.  5/17 he was transferred to [facility] in [place]. Evaluation included an echo with EF of 40-45%. Cardiac MR demonstrated EF of 43% with inferolateral and apical hypokinesis with inferolateral basilar scar which was if anything not worse compared to prior MR 4/16. He underwent EP testing. No scar was found and no ablation undertaken. Ventricular flutter was inducible. VT apparently was not. There have been discussions regarding ICD implantation. He elected to defer that decision He was discharged on increased mexiletine. He was seen again 6/17 for recurrent ventricular tachycardia. ECG was reviewed. Right bundle superior axis at a  cycle length of just under 300 ms  6/17 started on amiodarone  Bradycardia required down titration of amiodarone now on combination with mexiletine for ventricular tachycardia   He  had  stroke  11/15. He was found to be in atrial fibrillation. He was treated with TPA. Apixaban was initiated.  He has intellectual consequences and aphasia; these are improving  Denies chest pain or shortness of breath or peripheral edema  No bleeding  Patient denies symptoms of GI intolerance, sun sensitivity, neurological symptoms attributable to amiodarone.  Surveillance laboratories were in normal limits when checked 6 months  ago   Date Cr K TSH LFTs PFTs  6/17  0.94 3.9 2.8 25    8/18 0.9 4.0 7.5 16   1/19   4.99    2/19 0.8 4.0 3.9 18             DATE Rate PR interval QRSduration Dose-Amio  10/18 62  162 112 200  1/18 54 212 112 200  1/19  66  206  104  200 x 5/w            Past Medical History:  Diagnosis Date  . Arthritis   . Benign prostatic hypertrophy   . Bursitis of left shoulder   . CHF (congestive heart failure) ([place])   . Diabetes ([place])   . Dyspnea on exertion 11/01/2011  . Elevated PSA   . History of colon polyps   . History of colon polyps   . Hyperlipidemia   . Hypertension   . Melanoma ([place])    under arm  . Microscopic hematuria   . Osteoarthritis   . Retroperitoneal mass   . Stroke ([place]) 05/2014  . TIA (transient ischemic attack)   .  Urinary retention   . UTI (urinary tract infection)   . Ventricular tachycardia ([facility])    RBB/LAHB ideopathic VT, 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    Past Surgical History:  Procedure Laterality Date  . CARDIOVERSION    . COLONOSCOPY WITH PROPOFOL N/A 10/17/2015   Procedure: COLONOSCOPY WITH PROPOFOL;  Surgeon: Manya Silvas, MD;  Location: [facility] END[facility]COPY;  Service: Endoscopy;  Laterality: N/A;  . ELECTROPHYSIOLOGIC STUDY N/A 12/07/2015   Procedure: V Tach Ablation;  Surgeon: Will Pepin Leeds, MD;  Location: [facility];   Service: Cardiovascular;  Laterality: N/A;  . JOINT REPLACEMENT     R TKR  . RADIOLOGY WITH ANESTHESIA N/A 05/18/2014   Procedure: RADIOLOGY WITH ANESTHESIA;  Surgeon: Rob Hickman, MD;  Location: [facility];  Service: Radiology;  Laterality: N/A;  . REPLACEMENT TOTAL KNEE    . REPLACEMENT TOTAL KNEE Left   . VASECTOMY      Current Outpatient Medications  Medication Sig Dispense Refill  . amiodarone (PACERONE) 200 MG tablet TAKE ONE TABLET EVERY DAY AS DIRECTED MON-FRI 30 tablet 1  . Ascorbic Acid (VITAMIN C) 500 MG tablet Take 500 mg by mouth daily.      Marland Kitchen atorvastatin (LIPITOR) 10 MG tablet Take 10 mg by mouth daily.     . Cholecalciferol (VITAMIN D3) 1000 UNITS CAPS Take 1,000 Units by mouth daily.     Marland Kitchen ELIQUIS 5 MG TABS tablet TAKE ONE TABLET TWICE DAILY 60 tablet 6  . finasteride (PROSCAR) 5 MG tablet TAKE ONE TABLET EVERY DAY 90 tablet 3  . levothyroxine (SYNTHROID, LEVOTHROID) 50 MCG tablet Take 1 tablet by mouth daily.    Marland Kitchen mexiletine (MEXITIL) 150 MG capsule TAKE 1 CAPSULE BY MOUTH TWICE DAILY 60 capsule 3  . Multiple Vitamin (MULTIVITAMIN WITH MINERALS) TABS tablet Take 1 tablet by mouth daily. One A Day 50+    . tamsulosin (FLOMAX) 0.4 MG CAPS capsule Take 1 capsule (0.4 mg total) by mouth daily. Take two capsules daily 180 capsule 3  . torsemide (DEMADEX) 10 MG tablet Take 10 mg by mouth daily.      No current facility-administered medications for this visit.     Allergies  Allergen Reactions  . Ciprofloxacin Anaphylaxis  . Levofloxacin Anaphylaxis    "throat closes"  . Enalapril Maleate Swelling    Angioedema  . Metoprolol Swelling    edema  . Vasotec [Enalaprilat] Other (See Comments)    Reaction: Angioedema  . Sulfa Antibiotics Rash and Other (See Comments)    Hypotension    Review of Systems negative except from HPI and PMH  Physical Exam BP 136/76 (BP Location: Left Arm, Patient Position: Sitting, Cuff Size: Normal)   Pulse 67   Ht 5\' 6"  (1.676 m)    Wt 178 lb 8 oz (81 kg)   BMI 28.81 kg/m  Well developed and nourished in no acute distress HENT normal Neck supple with JVP-flat Clear Regular rate and rhythm, no murmurs or gallops Abd-soft with active BS No Clubbing cyanosis edema Skin-warm and dry A & Oriented  Grossly normal sensory and motor function   ECG atrial fibrillation at 67 Intervals-/11/39    Assessment and  Plan  Ventricular tachycardia  Low-voltage ECG  Cardiomyopathy-nonischemic abnormal signal average ECG/MRI negative fat pad biopsy  Persistent atrial fibrillation  Stroke   Sinus bradycardia  Hypothyroidism-iatrogenic-treated  High Risk Medication Surveillance,   No intercurrent Ventricular tachycardia  On Anticoagulation;  No bleeding issues   Euvolemic continue current meds  Persistent atrial fibrillation.  We have discussed sinus rhythm versus undertaking cardioversion.  We have elected to pursue the latter.  He is not sure whether he is symptomatic related to his atrial fibrillation so he is wife will be attuned to whether he is better following cardioversion.  This would inform decision-making regarding cardioversion in the future.  We spent more than 50% of our >25 min visit in face to face counseling regarding the above  We will check amiodarone surveillance laboratories today including TSH on thyroid replacement; it has not been checked in some months and he may actually be hyperthyroid.

## 2018-02-28 LAB — COMPREHENSIVE METABOLIC PANEL
ALT: 20 IU/L (ref 0–44)
AST: 20 IU/L (ref 0–40)
Albumin/Globulin Ratio: 1.8 (ref 1.2–2.2)
Albumin: 4.3 g/dL (ref 3.5–4.8)
Alkaline Phosphatase: 105 IU/L (ref 39–117)
BUN/Creatinine Ratio: 19 (ref 10–24)
BUN: 16 mg/dL (ref 8–27)
Bilirubin Total: 1.2 mg/dL (ref 0.0–1.2)
CALCIUM: 8.9 mg/dL (ref 8.6–10.2)
CO2: 26 mmol/L (ref 20–29)
Chloride: 103 mmol/L (ref 96–106)
Creatinine, Ser: 0.86 mg/dL (ref 0.76–1.27)
GFR, EST AFRICAN AMERICAN: 95 mL/min/{1.73_m2} (ref >59)
GFR, EST NON AFRICAN AMERICAN: 82 mL/min/{1.73_m2} (ref >59)
GLUCOSE: 92 mg/dL (ref 65–99)
Globulin, Total: 2.4 g/dL (ref 1.5–4.5)
Potassium: 4.6 mmol/L (ref 3.5–5.2)
Sodium: 143 mmol/L (ref 134–144)
TOTAL PROTEIN: 6.7 g/dL (ref 6.0–8.5)

## 2018-02-28 LAB — CBC WITH DIFFERENTIAL/PLATELET
BASOS ABS: 0 10*3/uL (ref 0.0–0.2)
BASOS: 0 %
EOS (ABSOLUTE): 0.2 10*3/uL (ref 0.0–0.4)
Eos: 3 %
HEMOGLOBIN: 15.5 g/dL (ref 13.0–17.7)
Hematocrit: 46.6 % (ref 37.5–51.0)
IMMATURE GRANS (ABS): 0 10*3/uL (ref 0.0–0.1)
IMMATURE GRANULOCYTES: 1 %
LYMPHS: 17 %
Lymphocytes Absolute: 1.2 10*3/uL (ref 0.7–3.1)
MCH: 31.8 pg (ref 26.6–33.0)
MCHC: 33.3 g/dL (ref 31.5–35.7)
MCV: 96 fL (ref 79–97)
MONOCYTES: 9 %
Monocytes Absolute: 0.6 10*3/uL (ref 0.1–0.9)
NEUTROS ABS: 5 10*3/uL (ref 1.4–7.0)
NEUTROS PCT: 70 %
Platelets: 162 10*3/uL (ref 150–450)
RBC: 4.87 x10E6/uL (ref 4.14–5.80)
RDW: 13.3 % (ref 12.3–15.4)
WBC: 7 10*3/uL (ref 3.4–10.8)

## 2018-03-03 ENCOUNTER — Other Ambulatory Visit: Payer: Self-pay | Admitting: Internal Medicine

## 2018-03-06 ENCOUNTER — Other Ambulatory Visit: Payer: Self-pay | Admitting: Internal Medicine

## 2018-03-06 DIAGNOSIS — I4819 Other persistent atrial fibrillation: Secondary | ICD-10-CM

## 2018-03-07 ENCOUNTER — Ambulatory Visit: Payer: Medicare Other | Admitting: Anesthesiology

## 2018-03-07 ENCOUNTER — Ambulatory Visit
Admission: RE | Admit: 2018-03-07 | Discharge: 2018-03-07 | Disposition: A | Discharge status: Home / Self Care | Payer: Medicare Other | Source: Ambulatory Visit | Attending: Cardiovascular Disease | Admitting: Cardiovascular Disease

## 2018-03-07 ENCOUNTER — Encounter
Admission: RE | Disposition: A | Discharge status: Home / Self Care | Payer: Self-pay | Source: Ambulatory Visit | Attending: Cardiovascular Disease

## 2018-03-07 DIAGNOSIS — I509 Heart failure, unspecified: Secondary | ICD-10-CM | POA: Insufficient documentation

## 2018-03-07 DIAGNOSIS — E785 Hyperlipidemia, unspecified: Secondary | ICD-10-CM | POA: Insufficient documentation

## 2018-03-07 DIAGNOSIS — Z86010 Personal history of colonic polyps: Secondary | ICD-10-CM | POA: Insufficient documentation

## 2018-03-07 DIAGNOSIS — Z881 Allergy status to other antibiotic agents status: Secondary | ICD-10-CM | POA: Diagnosis not present

## 2018-03-07 DIAGNOSIS — E032 Hypothyroidism due to medicaments and other exogenous substances: Secondary | ICD-10-CM | POA: Diagnosis not present

## 2018-03-07 DIAGNOSIS — I481 Persistent atrial fibrillation: Secondary | ICD-10-CM

## 2018-03-07 DIAGNOSIS — I6990 Unspecified sequelae of unspecified cerebrovascular disease: Secondary | ICD-10-CM | POA: Diagnosis not present

## 2018-03-07 DIAGNOSIS — Z882 Allergy status to sulfonamides status: Secondary | ICD-10-CM | POA: Diagnosis not present

## 2018-03-07 DIAGNOSIS — M1990 Unspecified osteoarthritis, unspecified site: Secondary | ICD-10-CM | POA: Diagnosis not present

## 2018-03-07 DIAGNOSIS — E119 Type 2 diabetes mellitus without complications: Secondary | ICD-10-CM | POA: Insufficient documentation

## 2018-03-07 DIAGNOSIS — N400 Enlarged prostate without lower urinary tract symptoms: Secondary | ICD-10-CM | POA: Diagnosis not present

## 2018-03-07 DIAGNOSIS — I472 Ventricular tachycardia: Secondary | ICD-10-CM | POA: Insufficient documentation

## 2018-03-07 DIAGNOSIS — Z85820 Personal history of malignant melanoma of skin: Secondary | ICD-10-CM | POA: Diagnosis not present

## 2018-03-07 DIAGNOSIS — Z888 Allergy status to other drugs, medicaments and biological substances status: Secondary | ICD-10-CM | POA: Insufficient documentation

## 2018-03-07 DIAGNOSIS — I429 Cardiomyopathy, unspecified: Secondary | ICD-10-CM | POA: Diagnosis not present

## 2018-03-07 DIAGNOSIS — Z87891 Personal history of nicotine dependence: Secondary | ICD-10-CM | POA: Insufficient documentation

## 2018-03-07 DIAGNOSIS — I110 Hypertensive heart disease with heart failure: Secondary | ICD-10-CM | POA: Diagnosis not present

## 2018-03-07 DIAGNOSIS — Z79899 Other long term (current) drug therapy: Secondary | ICD-10-CM | POA: Diagnosis not present

## 2018-03-07 DIAGNOSIS — I4819 Other persistent atrial fibrillation: Secondary | ICD-10-CM

## 2018-03-07 HISTORY — PX: CARDIOVERSION: EP1203

## 2018-03-07 SURGERY — CARDIOVERSION (CATH LAB)
Anesthesia: General

## 2018-03-07 MED ORDER — SODIUM CHLORIDE 0.9 % IV SOLN
INTRAVENOUS | Status: DC
  Administered 2018-03-07: 08:00:00 via INTRAVENOUS

## 2018-03-07 MED ORDER — PROPOFOL 10 MG/ML IV BOLUS
INTRAVENOUS | Status: DC | PRN
  Administered 2018-03-07: 20 mg via INTRAVENOUS
  Administered 2018-03-07: 50 mg via INTRAVENOUS

## 2018-03-07 MED ORDER — PROPOFOL 10 MG/ML IV BOLUS
INTRAVENOUS | Status: AC
  Filled 2018-03-07: qty 20

## 2018-03-07 NOTE — Anesthesia Post-op Follow-up Note (Signed)
Anesthesia QCDR form completed.        

## 2018-03-07 NOTE — Anesthesia Postprocedure Evaluation (Signed)
Anesthesia Post Note  Patient: Austin Warren  Procedure(s) Performed: CARDIOVERSION (N/A )  Patient location during evaluation: PACU Anesthesia Type: General Level of consciousness: awake and alert Pain management: pain level controlled Vital Signs Assessment: post-procedure vital signs reviewed and stable Respiratory status: spontaneous breathing, nonlabored ventilation, respiratory function stable and patient connected to nasal cannula oxygen Cardiovascular status: blood pressure returned to baseline and stable Postop Assessment: no apparent nausea or vomiting Anesthetic complications: no     Last Vitals:  Vitals:   03/07/18 0815 03/07/18 0830  BP: 118/70   Pulse: 74 74  Resp: 17 17  Temp:    SpO2: 96% 96%    Last Pain:  Vitals:   03/07/18 0830  TempSrc:   PainSc: 0-No pain                 Molli Barrows

## 2018-03-07 NOTE — Interval H&P Note (Signed)
History and Physical Interval Note:  03/07/2018 7:48 AM  Austin Warren  has presented today for surgery, with the diagnosis of Cardioversion    Afibrillation  The various methods of treatment have been discussed with the patient and family. After consideration of risks, benefits and other options for treatment, the patient has consented to  Procedure(s): CARDIOVERSION (N/A) as a surgical intervention .  The patient's history has been reviewed, patient examined, no change in status, stable for surgery.  I have reviewed the patient's chart and labs.  Questions were answered to the patient's satisfaction.     [name]

## 2018-03-07 NOTE — Anesthesia Preprocedure Evaluation (Signed)
Anesthesia Evaluation  Patient identified by MRN, date of birth, ID band Patient awake    Reviewed: Allergy & Precautions, H&P , NPO status , Patient's Chart, lab work & pertinent test results, reviewed documented beta blocker date and time   Airway Mallampati: II   Neck ROM: full    Dental  (+) Poor Dentition   Pulmonary neg pulmonary ROS, former smoker,    Pulmonary exam normal        Cardiovascular Exercise Tolerance: Poor hypertension, On Medications +CHF  negative cardio ROS Normal cardiovascular examAtrial Fibrillation  Rhythm:regular Rate:Normal     Neuro/Psych TIACVA, Residual Symptoms negative neurological ROS  negative psych ROS   GI/Hepatic negative GI ROS, Neg liver ROS,   Endo/Other  negative endocrine ROSdiabetes  Renal/GU Renal diseasenegative Renal ROS  negative genitourinary   Musculoskeletal   Abdominal   Peds  Hematology negative hematology ROS (+)   Anesthesia Other Findings Past Medical History: No date: Arthritis No date: Benign prostatic hypertrophy No date: Bursitis of left shoulder No date: CHF (congestive heart failure) ([facility]) No date: Diabetes ([place]) 11/01/2011: Dyspnea on exertion No date: Elevated PSA No date: History of colon polyps No date: History of colon polyps No date: Hyperlipidemia No date: Hypertension No date: Melanoma ([place])     Comment:  under arm No date: Microscopic hematuria No date: Osteoarthritis No date: Retroperitoneal mass 05/2014: Stroke ([place]) No date: TIA (transient ischemic attack) No date: Urinary retention No date: UTI (urinary tract infection) No date: Ventricular tachycardia ([place])     Comment:  RBB/LAHB ideopathic VT, 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 Past Surgical History: No date: CARDIOVERSION 10/17/2015: COLONOSCOPY WITH PROPOFOL; N/A     Comment:  Procedure: COLONOSCOPY WITH PROPOFOL;  Surgeon: Manya Silvas, MD;  Location: [facility]  ENDOSCOPY;  Service:               Endoscopy;  Laterality: N/A; 12/07/2015: ELECTROPHYSIOLOGIC STUDY; N/A     Comment:  Procedure: V Tach Ablation;  Surgeon: Will Temme Leeds, MD;  Location: [facility];  Service:               Cardiovascular;  Laterality: N/A; No date: JOINT REPLACEMENT     Comment:  R TKR 05/18/2014: RADIOLOGY WITH ANESTHESIA; N/A     Comment:  Procedure: RADIOLOGY WITH ANESTHESIA;  Surgeon: Rob Hickman, MD;  Location: [facility];  Service: Radiology;                Laterality: N/A; No date: REPLACEMENT TOTAL KNEE No date: REPLACEMENT TOTAL KNEE; Left No date: VASECTOMY BMI    Body Mass Index:  28.82 kg/m     Reproductive/Obstetrics negative OB ROS                             Anesthesia Physical Anesthesia Plan  ASA: IV  Anesthesia Plan: General   Post-op Pain Management:    Induction:   PONV Risk Score and Plan:   Airway Management Planned:   Additional Equipment:   Intra-op Plan:   Post-operative Plan:   Informed Consent: I have reviewed the patients History and Physical, chart, labs and discussed the procedure including the risks, benefits and alternatives for the proposed  anesthesia with the patient or authorized representative who has indicated his/her understanding and acceptance.   Dental Advisory Given  Plan Discussed with: CRNA  Anesthesia Plan Comments:         Anesthesia Quick Evaluation

## 2018-03-07 NOTE — Anesthesia Procedure Notes (Signed)
Performed by: Frona Yost, CRNA Pre-anesthesia Checklist: Patient identified, Emergency Drugs available, Suction available and Patient being monitored Patient Re-evaluated:Patient Re-evaluated prior to induction Oxygen Delivery Method: Nasal cannula Induction Type: IV induction Dental Injury: Teeth and Oropharynx as per pre-operative assessment  Comments: Nasal cannula with etCO2 monitoring       

## 2018-03-07 NOTE — Transfer of Care (Signed)
Immediate Anesthesia Transfer of Care Note  Patient: Austin Warren  Procedure(s) Performed: Procedure(s): CARDIOVERSION (N/A)  Patient Location: PACU and Short Stay  Anesthesia Type:General  Level of Consciousness: awake, alert  and oriented  Airway & Oxygen Therapy: Patient Spontanous Breathing and Patient connected to nasal cannula oxygen  Post-op Assessment: Report given to RN and Post -op Vital signs reviewed and stable  Post vital signs: Reviewed and stable  Last Vitals:  Vitals:   03/07/18 0746 03/07/18 0747  BP:  118/76  Pulse: 65 72  Resp: 15 (!) 26  Temp:    SpO2: 78% 46%    Complications: No apparent anesthesia complications

## 2018-03-07 NOTE — Discharge Instructions (Signed)
Electrical Cardioversion °Electrical cardioversion is the delivery of a jolt of electricity to restore a normal rhythm to the heart. A rhythm that is too fast or is not regular keeps the heart from pumping well. In this procedure, sticky patches or metal paddles are placed on the chest to deliver electricity to the heart from a device. °This procedure may be done in an emergency if: °· There is low or no blood pressure as a result of the heart rhythm. °· Normal rhythm must be restored as fast as possible to protect the brain and heart from further damage. °· It may save a life. ° °This procedure may also be done for irregular or fast heart rhythms that are not immediately life-threatening. °Tell a health care provider about: °· Any allergies you have. °· All medicines you are taking, including vitamins, herbs, eye drops, creams, and over-the-counter medicines. °· Any problems you or family members have had with anesthetic medicines. °· Any blood disorders you have. °· Any surgeries you have had. °· Any medical conditions you have. °· Whether you are pregnant or may be pregnant. °What are the risks? °Generally, this is a safe procedure. However, problems may occur, including: °· Allergic reactions to medicines. °· A blood clot that breaks free and travels to other parts of your body. °· The possible return of an abnormal heart rhythm within hours or days after the procedure. °· Your heart stopping (cardiac arrest ). This is rare. ° °What happens before the procedure? °Medicines °· Your health care provider may have you start taking: °? Blood-thinning medicines (anticoagulants) so your blood does not clot as easily. °? Medicines may be given to help stabilize your heart rate and rhythm. °· Ask your health care provider about changing or stopping your regular medicines. This is especially important if you are taking diabetes medicines or blood thinners. °General instructions °· Plan to have someone take you home from  the hospital or clinic. °· If you will be going home right after the procedure, plan to have someone with you for 24 hours. °· Follow instructions from your health care provider about eating or drinking restrictions. °What happens during the procedure? °· To lower your risk of infection: °? Your health care team will wash or sanitize their hands. °? Your skin will be washed with soap. °· An IV tube will be inserted into one of your veins. °· You will be given a medicine to help you relax (sedative). °· Sticky patches (electrodes) or metal paddles may be placed on your chest. °· An electrical shock will be delivered. °The procedure may vary among health care providers and hospitals. °What happens after the procedure? °· Your blood pressure, heart rate, breathing rate, and blood oxygen level will be monitored until the medicines you were given have worn off. °· Do not drive for 24 hours if you were given a sedative. °· Your heart rhythm will be watched to make sure it does not change. °This information is not intended to replace advice given to you by your health care provider. Make sure you discuss any questions you have with your health care provider. °Document Released: 06/22/2002 Document Revised: 02/29/2016 Document Reviewed: 01/06/2016 °Elsevier Interactive Patient Education © 2017 [employer] ° °

## 2018-03-07 NOTE — CV Procedure (Signed)
Cardioversion note: A standard informed consent was obtained. Timeout was performed. The pads were placed in the anterior posterior fashion. The patient was given propofol by the anesthesia team.  Successful cardioversion was performed with a 150 J. The patient converted to sinus rhythm. Pre-and post EKGs were reviewed. The patient tolerated the procedure with no immediate complications.  Recommendations: Continue same medications and follow-up in 2-3 weeks.  

## 2018-03-31 ENCOUNTER — Other Ambulatory Visit: Payer: Self-pay | Admitting: Internal Medicine

## 2018-04-10 ENCOUNTER — Ambulatory Visit: Payer: Medicare Other | Admitting: Internal Medicine

## 2018-04-10 ENCOUNTER — Encounter: Payer: Self-pay | Admitting: Internal Medicine

## 2018-04-10 VITALS — BP 126/70 | HR 61 | Ht 66.0 in | Wt 178.2 lb

## 2018-04-10 DIAGNOSIS — Z79899 Other long term (current) drug therapy: Secondary | ICD-10-CM | POA: Diagnosis not present

## 2018-04-10 DIAGNOSIS — I481 Persistent atrial fibrillation: Secondary | ICD-10-CM | POA: Diagnosis not present

## 2018-04-10 DIAGNOSIS — I472 Ventricular tachycardia, unspecified: Secondary | ICD-10-CM

## 2018-04-10 DIAGNOSIS — R001 Bradycardia, unspecified: Secondary | ICD-10-CM | POA: Diagnosis not present

## 2018-04-10 DIAGNOSIS — I4819 Other persistent atrial fibrillation: Secondary | ICD-10-CM

## 2018-04-10 NOTE — Patient Instructions (Signed)
Medication Instructions: - Your physician recommends that you continue on your current medications as directed. Please refer to the Current Medication list given to you today.  Labwork: - none ordered  Procedures/Testing: - none ordered  Follow-Up: - Your physician recommends that you schedule a follow-up appointment in: 3 months with Dr. Klein.   Any Additional Special Instructions Will Be Listed Below (If Applicable).     If you need a refill on your cardiac medications before your next appointment, please call your pharmacy.   

## 2018-04-10 NOTE — Progress Notes (Signed)
Patient Care Team: Kirk Ruths, MD as PCP - General (Unknown Physician Specialty)   HPI  Austin Warren is a 77 y.o. male Seen in followup for a ventricular tachycardia with a right bundle superior axis relatively narrow QRS complex and positive concordance suggesting a septal origin and possible verapamil sensitivity. Catheterization had demonstrated normal coronary arteries and normal left ventricular function. ECG was notable for marked reduction in voltage   Signal average Electrocardiogram was markedly abnormal.  MRI 4/16>>There is no significant change from the prior study with persistent basal inferior and inferolateral late gadolinium enhancement slightly more pronounced on the current study and a small focal late gadolinium enhancement at the point of inferior RV attachment to the LV wall.  He also underwent fat pad biopsy>> neg  Echocardiogram 11/15 demonstrated left ventricular hypertrophy with an ejection fraction of 45-50% with hypokinesis of the inferior apical myocardium   We initially tried him on a beta blocker prescription. He had recurrent ventricular tachycardia. He was put on verapamil.   Had recurrent tachycardia >>>  started on mexiletine.  5/17 he was transferred to [facility] in [place]. Evaluation included an echo with EF of 40-45%. Cardiac MR demonstrated EF of 43% with inferolateral and apical hypokinesis with inferolateral basilar scar which was if anything not worse compared to prior MR 4/16. He underwent EP testing. No scar was found and no ablation undertaken. Ventricular flutter was inducible. VT apparently was not. There have been discussions regarding ICD implantation. He elected to defer that decision He was discharged on increased mexiletine. He was seen again 6/17 for recurrent ventricular tachycardia. ECG was reviewed. Right bundle superior axis at a cycle length of just under 300 ms  6/17 started on amiodarone  Bradycardia required down titration of  amiodarone now on combination with mexiletine for ventricular tachycardia   He  had  stroke  11/15. He was found to be in atrial fibrillation. He was treated with TPA. Apixaban was initiated.  He has intellectual consequences and aphasia; these are improving    8/19 found to be in AFib DCCV>> to assess benefit of sinus rhythm   Date Cr K TSH LFTs PFTs  6/17  0.94 3.9 2.8 25    8/18 0.9 4.0 7.5 16   1/19   4.99    2/19 0.8 4.0 3.9 18   8/19 0.86 4.6        DATE Rate PR interval QRSduration Dose-Amio  10/18 62  162 112 200  1/18 54 212 112 200  1/19  66  206  104  200 x 5/w         Since cardioversion, both he and his wife think he is doing better with more energy and less dyspnea.  He has had no edema.    Patient denies symptoms of GI intolerance, sun sensitivity, neurological symptoms attributable to amiodarone.  Surveillance laboratories were in normal limits As above   Past Medical History:  Diagnosis Date  . Arthritis   . Benign prostatic hypertrophy   . Bursitis of left shoulder   . CHF (congestive heart failure) ([place])   . Diabetes ([place])   . Dyspnea on exertion 11/01/2011  . Elevated PSA   . History of colon polyps   . History of colon polyps   . Hyperlipidemia   . Hypertension   . Melanoma ([place])    under arm  . Microscopic hematuria   . Osteoarthritis   . Retroperitoneal mass   . Stroke [facility])  05/2014  . TIA (transient ischemic attack)   . Urinary retention   . UTI (urinary tract infection)   . Ventricular tachycardia (HCC)    RBB/LAHB ideopathic VT, 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    Past Surgical History:  Procedure Laterality Date  . CARDIOVERSION    . CARDIOVERSION N/A 03/07/2018   Procedure: CARDIOVERSION;  Surgeon: Wellington Hampshire, MD;  Location: [facility] ORS;  Service: Cardiovascular;  Laterality: N/A;  . COLONOSCOPY WITH PROPOFOL N/A 10/17/2015   Procedure: COLONOSCOPY WITH PROPOFOL;  Surgeon: Manya Silvas, MD;  Location: [facility] ENDOSCOPY;  Service:  Endoscopy;  Laterality: N/A;  . ELECTROPHYSIOLOGIC STUDY N/A 12/07/2015   Procedure: V Tach Ablation;  Surgeon: Will Burrowes Leeds, MD;  Location: [place] CV LAB;  Service: Cardiovascular;  Laterality: N/A;  . JOINT REPLACEMENT     R TKR  . RADIOLOGY WITH ANESTHESIA N/A 05/18/2014   Procedure: RADIOLOGY WITH ANESTHESIA;  Surgeon: Rob Hickman, MD;  Location: [place];  Service: Radiology;  Laterality: N/A;  . REPLACEMENT TOTAL KNEE    . REPLACEMENT TOTAL KNEE Left   . VASECTOMY      Current Outpatient Medications  Medication Sig Dispense Refill  . amiodarone (PACERONE) 200 MG tablet TAKE ONE TABLET BY MOUTH EACH DAY AS DIRECTED MONDAY THRU FRIDAY 90 tablet 3  . Ascorbic Acid (VITAMIN C) 500 MG tablet Take 500 mg by mouth daily.      Marland Kitchen atorvastatin (LIPITOR) 10 MG tablet Take 10 mg by mouth daily.     . Cholecalciferol (VITAMIN D3) 1000 UNITS CAPS Take 1,000 Units by mouth daily.     Marland Kitchen ELIQUIS 5 MG TABS tablet TAKE ONE TABLET TWICE DAILY (Patient taking differently: Take 5 mg by mouth 2 (two) times daily. ) 60 tablet 6  . finasteride (PROSCAR) 5 MG tablet TAKE ONE TABLET EVERY DAY (Patient taking differently: Take 5 mg by mouth daily. ) 90 tablet 3  . levothyroxine (SYNTHROID, LEVOTHROID) 50 MCG tablet Take 50 mcg by mouth daily before breakfast.     . mexiletine (MEXITIL) 150 MG capsule TAKE 1 CAPSULE BY MOUTH TWICE DAILY 180 capsule 3  . Multiple Vitamin (MULTIVITAMIN WITH MINERALS) TABS tablet Take 1 tablet by mouth daily. One A Day 50+    . tamsulosin (FLOMAX) 0.4 MG CAPS capsule Take 1 capsule (0.4 mg total) by mouth daily. Take two capsules daily (Patient taking differently: Take 0.8 mg by mouth daily. ) 180 capsule 3  . torsemide (DEMADEX) 10 MG tablet Take 10 mg by mouth daily.      No current facility-administered medications for this visit.     Allergies  Allergen Reactions  . Ciprofloxacin Anaphylaxis  . Levofloxacin Anaphylaxis    "throat closes"  . Enalapril  Maleate Swelling    Angioedema  . Metoprolol Swelling    edema  . Vasotec [Enalaprilat] Other (See Comments)    Reaction: Angioedema  . Sulfa Antibiotics Rash and Other (See Comments)    Hypotension    Review of Systems negative except from HPI and PMH  Physical Exam BP 126/70 (BP Location: Left Arm, Patient Position: Sitting, Cuff Size: Normal)   Pulse 61   Ht 5\' 6"  (1.676 m)   Wt 178 lb 4 oz (80.9 kg)   BMI 28.77 kg/m  Well developed and nourished in no acute distress HENT normal Neck supple with JVP-flat Clear Regular rate and rhythm, no murmurs or gallops Abd-soft with active BS No Clubbing cyanosis edema Skin-warm and dry A &  Oriented  Grossly normal sensory and motor function   ECG sinus @ 62 21/10/45   Assessment and  Plan  Ventricular tachycardia  Low-voltage ECG  Cardiomyopathy-nonischemic abnormal signal average ECG/MRI negative fat pad biopsy  Persistent atrial fibrillation  Stroke   Sinus bradycardia  Hypothyroidism-iatrogenic-treated  High Risk Medication Surveillance,   No intercurrent Ventricular tachycardia  On Anticoagulation;  No bleeding issues   Feels better with sinus rhythm.  Will pursue efforts at rhythm control.  We have discussed medication augmentation versus intermittent cardioversion.  For now we will pursue the latter.  I have reviewed with his wife how to take his pulse.  Unfortunately there is not a great deal of variation in rate so she will have to be very attentive to rhythm variations.  I suggest that if she has concerns to come on into we will get an electrocardiogram.  Amiodarone laboratory for within range at last assessment

## 2018-05-12 ENCOUNTER — Other Ambulatory Visit: Payer: Self-pay | Admitting: Internal Medicine

## 2018-06-30 ENCOUNTER — Other Ambulatory Visit: Payer: Self-pay | Admitting: Internal Medicine

## 2018-07-03 ENCOUNTER — Encounter: Payer: Self-pay | Admitting: Internal Medicine

## 2018-07-03 ENCOUNTER — Ambulatory Visit: Payer: Medicare Other | Admitting: Internal Medicine

## 2018-07-03 VITALS — BP 111/65 | HR 58 | Ht 66.0 in | Wt 179.0 lb

## 2018-07-03 DIAGNOSIS — R001 Bradycardia, unspecified: Secondary | ICD-10-CM

## 2018-07-03 DIAGNOSIS — I4819 Other persistent atrial fibrillation: Secondary | ICD-10-CM | POA: Diagnosis not present

## 2018-07-03 DIAGNOSIS — I428 Other cardiomyopathies: Secondary | ICD-10-CM | POA: Diagnosis not present

## 2018-07-03 DIAGNOSIS — I472 Ventricular tachycardia, unspecified: Secondary | ICD-10-CM

## 2018-07-03 DIAGNOSIS — Z79899 Other long term (current) drug therapy: Secondary | ICD-10-CM

## 2018-07-03 MED ORDER — AMIODARONE HCL 200 MG PO TABS: ORAL_TABLET | ORAL | Status: DC

## 2018-07-03 NOTE — Patient Instructions (Signed)
Medication Instructions:  - Your physician has recommended you make the following change in your medication:   1) Amiodarone 200 mg- take 1 tablet by mouth on Mondays, Wednesdays, and Fridays  If you need a refill on your cardiac medications before your next appointment, please call your pharmacy.   Lab work: - none ordered  If you have labs (blood work) drawn today and your tests are completely normal, you will receive your results only by: Marland Kitchen MyChart Message (if you have MyChart) OR . A paper copy in the mail If you have any lab test that is abnormal or we need to change your treatment, we will call you to review the results.  Testing/Procedures: - none ordered  Follow-Up: At  [facility], you and your health needs are our priority.  As part of our continuing mission to provide you with exceptional heart care, we have created designated Provider Care Teams.  These Care Teams include your primary Cardiologist (physician) and Advanced Practice Providers (APPs -  Physician Assistants and Nurse Practitioners) who all work together to provide you with the care you need, when you need it. . You will need a follow up appointment in 6 months with Dr. Caryl Comes.  Please call our office 2 months in advance to schedule this appointment.    Any Other Special Instructions Will Be Listed Below (If Applicable). - N/A

## 2018-07-03 NOTE — Progress Notes (Signed)
Patient Care Team: Kirk Ruths, MD as PCP - General (Unknown Physician Specialty)   HPI  Austin Warren is a 77 y.o. male Seen in followup for a ventricular tachycardia with a right bundle superior axis relatively narrow QRS complex and positive concordance suggesting a septal origin and possible verapamil sensitivity. Catheterization had demonstrated normal coronary arteries and normal left ventricular function. ECG was notable for marked reduction in voltage   Signal average Electrocardiogram was markedly abnormal.  MRI 4/16>>There is no significant change from the prior study with persistent basal inferior and inferolateral late gadolinium enhancement slightly more pronounced on the current study and a small focal late gadolinium enhancement at the point of inferior RV attachment to the LV wall.  He also underwent fat pad biopsy>> neg  Echocardiogram 11/15 demonstrated left ventricular hypertrophy with an ejection fraction of 45-50% with hypokinesis of the inferior apical myocardium   We initially tried him on a beta blocker prescription. He had recurrent ventricular tachycardia. He was put on verapamil.   Had recurrent tachycardia >>>  started on mexiletine.  5/17 he was transferred to [facility] in [place]. Evaluation included an echo with EF of 40-45%. Cardiac MR demonstrated EF of 43% with inferolateral and apical hypokinesis with inferolateral basilar scar which was if anything not worse compared to prior MR 4/16. He underwent EP testing. No scar was found and no ablation undertaken. Ventricular flutter was inducible. VT apparently was not. There have been discussions regarding ICD implantation. He elected to defer that decision He was discharged on increased mexiletine. He was seen again 6/17 for recurrent ventricular tachycardia. ECG was reviewed. Right bundle superior axis at a cycle length of just under 300 ms  6/17 started on amiodarone  Bradycardia required down titration of  amiodarone now on combination with mexiletine for ventricular tachycardia   He  had  stroke  11/15. He was found to be in atrial fibrillation. He was treated with TPA. Apixaban was initiated.  He has intellectual consequences and aphasia; these are improving    8/19 found to be in AFib DCCV>> to assess benefit of sinus rhythm   Date Cr K TSH Hgb LFTs PFTs  6/17  0.94 3.9 2.8  25    8/18 0.9 4.0 7.5  16   1/19   4.99     2/19 0.8 4.0 3.9  18   8/19 0.86 4.6  15.5               DATE Rate PR interval QRSduration Dose-Amio  10/18 62  162 112 200  1/18 54 212 112 200  1/19  66  206  104  200 x 5/w  12/19 58 220 106 200 x 5/w         Since cardioversion, both he and his wife think he is doing better with more energy and less dyspnea.  He has had no edema.   The patient denies chest pain, shortness of breath, nocturnal dyspnea, orthopnea or peripheral edema.  There have been no palpitations, lightheadedness or syncope.   No nausea cough or other symptoms of amiodarone intolerance  Past Medical History:  Diagnosis Date  . Arthritis   . Benign prostatic hypertrophy   . Bursitis of left shoulder   . CHF (congestive heart failure) ([place])   . Diabetes ([place])   . Dyspnea on exertion 11/01/2011  . Elevated PSA   . History of colon polyps   . History of colon polyps   . Hyperlipidemia   .  Hypertension   . Melanoma ([place])    under arm  . Microscopic hematuria   . Osteoarthritis   . Retroperitoneal mass   . Stroke ([place]) 05/2014  . TIA (transient ischemic attack)   . Urinary retention   . UTI (urinary tract infection)   . Ventricular tachycardia (HCC)    RBB/LAHB ideopathic VT, 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    Past Surgical History:  Procedure Laterality Date  . CARDIOVERSION    . CARDIOVERSION N/A 03/07/2018   Procedure: CARDIOVERSION;  Surgeon: Wellington Hampshire, MD;  Location: [facility] ORS;  Service: Cardiovascular;  Laterality: N/A;  . COLONOSCOPY WITH PROPOFOL N/A 10/17/2015    Procedure: COLONOSCOPY WITH PROPOFOL;  Surgeon: Manya Silvas, MD;  Location: [facility] ENDOSCOPY;  Service: Endoscopy;  Laterality: N/A;  . ELECTROPHYSIOLOGIC STUDY N/A 12/07/2015   Procedure: V Tach Ablation;  Surgeon: Will Ainsley Leeds, MD;  Location: [place] CV LAB;  Service: Cardiovascular;  Laterality: N/A;  . JOINT REPLACEMENT     R TKR  . RADIOLOGY WITH ANESTHESIA N/A 05/18/2014   Procedure: RADIOLOGY WITH ANESTHESIA;  Surgeon: Rob Hickman, MD;  Location: [place];  Service: Radiology;  Laterality: N/A;  . REPLACEMENT TOTAL KNEE    . REPLACEMENT TOTAL KNEE Left   . VASECTOMY      Current Outpatient Medications  Medication Sig Dispense Refill  . amiodarone (PACERONE) 200 MG tablet TAKE ONE TABLET BY MOUTH EACH DAY AS DIRECTED MONDAY THRU FRIDAY 90 tablet 3  . apixaban (ELIQUIS) 5 MG TABS tablet Take 1 tablet (5 mg total) by mouth 2 (two) times daily. 60 tablet 6  . Ascorbic Acid (VITAMIN C) 500 MG tablet Take 500 mg by mouth daily.      Marland Kitchen atorvastatin (LIPITOR) 10 MG tablet Take 10 mg by mouth daily.     . Cholecalciferol (VITAMIN D3) 1000 UNITS CAPS Take 1,000 Units by mouth daily.     . finasteride (PROSCAR) 5 MG tablet TAKE ONE TABLET EVERY DAY (Patient taking differently: Take 5 mg by mouth daily. ) 90 tablet 3  . levothyroxine (SYNTHROID, LEVOTHROID) 50 MCG tablet Take 50 mcg by mouth daily before breakfast.     . mexiletine (MEXITIL) 150 MG capsule TAKE 1 CAPSULE BY MOUTH TWICE DAILY 180 capsule 3  . Multiple Vitamin (MULTIVITAMIN WITH MINERALS) TABS tablet Take 1 tablet by mouth daily. One A Day 50+    . tamsulosin (FLOMAX) 0.4 MG CAPS capsule Take 1 capsule (0.4 mg total) by mouth daily. Take two capsules daily (Patient taking differently: Take 0.8 mg by mouth daily. ) 180 capsule 3  . torsemide (DEMADEX) 10 MG tablet Take 10 mg by mouth daily.      No current facility-administered medications for this visit.     Allergies  Allergen Reactions  . Ciprofloxacin  Anaphylaxis  . Levofloxacin Anaphylaxis    "throat closes"  . Enalapril Maleate Swelling    Angioedema  . Metoprolol Swelling    edema  . Vasotec [Enalaprilat] Other (See Comments)    Reaction: Angioedema  . Sulfa Antibiotics Rash and Other (See Comments)    Hypotension    Review of Systems negative except from HPI and PMH  Physical Exam BP 111/65 (BP Location: Left Arm, Patient Position: Sitting, Cuff Size: Normal)   Pulse (!) 58   Ht 5\' 6"  (1.676 m)   Wt 179 lb (81.2 kg)   BMI 28.89 kg/m  Well developed and nourished in no acute distress HENT normal Neck supple with  JVP-flat Clear Regular rate and rhythm, no murmurs or gallops Abd-soft with active BS No Clubbing cyanosis edema Skin-warm and dry A & Oriented  Grossly normal sensory and motor function    ECG sinus at 58 Intervals 22/11/41 Low voltage  Assessment and  Plan  Ventricular tachycardia  Low-voltage ECG  Cardiomyopathy-nonischemic abnormal signal average ECG/MRI negative fat pad biopsy  Persistent atrial fibrillation  Stroke   Sinus bradycardia  Hypothyroidism-iatrogenic-treated  High Risk Medication Surveillance,    No intercurrent Ventricular tachycardia  On Anticoagulation;  No bleeding issues   Will decrease amio to 3/d week continue mexilitene  We spent more than 50% of our >25 min visit in face to face counseling regarding the above

## 2018-10-27 ENCOUNTER — Other Ambulatory Visit: Payer: Self-pay | Admitting: Urology

## 2018-12-01 ENCOUNTER — Other Ambulatory Visit: Payer: Self-pay | Admitting: Urology

## 2018-12-01 DIAGNOSIS — N138 Other obstructive and reflux uropathy: Secondary | ICD-10-CM

## 2018-12-15 ENCOUNTER — Ambulatory Visit: Payer: Medicare Other | Admitting: Urology

## 2018-12-29 ENCOUNTER — Other Ambulatory Visit: Payer: Self-pay | Admitting: Internal Medicine

## 2018-12-29 NOTE — Telephone Encounter (Signed)
Pt's wt 81.2 kg, age 78, SCr 0.86 (6 mos ago), CrCl 78.68.  Pt overdue to see Dr. Caryl Comes and for labs.

## 2018-12-30 ENCOUNTER — Telehealth: Payer: Self-pay

## 2018-12-30 NOTE — Telephone Encounter (Signed)
   [facility] Pre-operative Risk Assessment    Request for surgical clearance:  1. What type of surgery is being performed? Colonoscopy   2. When is this surgery scheduled? 02/13/19   3. What type of clearance is required (medical clearance vs. Pharmacy clearance to hold med vs. Both)? Pharmacy  4. Are there any medications that need to be held prior to surgery and how long? Eliquis for 3 days prior   5. Practice name and name of physician performing surgery? [facility]/ [employer]   6. What is your office phone number [phone]    7.   What is your office fax number [phone]  8.   Anesthesia type (None, local, MAC, general) ? MAC   Austin Warren 12/30/2018, 1:39 PM  _________________________________________________________________   (provider comments below)

## 2018-12-30 NOTE — Telephone Encounter (Signed)
Patient with diagnosis of atrial fibrillation  on Eliquis for anticoagulation.    Procedure: colonoscopy Date of procedure: 02/13/2019  CHADS2-VASc score of  7 (CHF, HTN, AGE, DM2, stroke/tia x 2, , AGE, )  CrCl 78.7 Platelet count 162  Because of high CHADS2-VASc score with history of ischemic stroke, would recommend that Eliquis be held no more than 24 hours prior to colonoscopy and restarted 12-24 hrs after.   Will defer to Dr. Caryl Comes if he feels patient okay to hold for the 3 days prior that was requested.

## 2018-12-31 NOTE — Telephone Encounter (Signed)
Agree with Austin Warren  Would hold apixoban the morning of the procedure-- < 24 hrs off drug

## 2019-01-05 ENCOUNTER — Other Ambulatory Visit: Payer: Self-pay | Admitting: Urology

## 2019-01-05 DIAGNOSIS — N138 Other obstructive and reflux uropathy: Secondary | ICD-10-CM

## 2019-01-05 DIAGNOSIS — N401 Enlarged prostate with lower urinary tract symptoms: Secondary | ICD-10-CM

## 2019-01-21 NOTE — Progress Notes (Signed)
01/22/2019 10:49 AM   Austin Warren 08-Aug-1940 [id number]  Referring provider: Kirk Ruths, MD [facility] [place],  [place] [address]  Chief Complaint  Patient presents with  . Benign Prostatic Hypertrophy    HPI: Patient is a 78 year old male with a history of elevated PSA, history of hematuria, and BPH with LUTS who presents today for a 1 year follow-up with his wife, Austin Warren.    History of elevated PSA Patient underwent a biopsy of his prostate in 2005 for a PSA elevation of 5.5.  The results were benign.  He was initiated on finasteride 5 mg daily. His most recent PSA was 0.9 on 12/10/2014.  Due to his age and AUA guidelines, it was decided to discontinue PSA screenings as his last several PSAs have returned below 3.  We will continue annual rectal exams and if an abnormal finding is discovered, we will have further discussions at that time.  History of hematuria (high risk) Patient underwent a hematuria workup in 2016 and was found to have an enlarged prostate and a Bosniak 2 left lower pole cyst.  He does not report any gross hematuria over the last year.    BPH WITH LUTS His IPSS score today is 10, which is moderate lower urinary tract symptomatology.  He is mostly satisfied with his quality life due to his urinary symptoms.  His PVR is 242 mL.  His previous IPSS score was 9/2.  His previous PVR is 199 mL.  He has no complaints today.  He denies any dysuria, hematuria or suprapubic pain.  He currently taking tamsulosin 0.4 mg daily and finasteride 5 mg daily.  His has had cystoscopy on 05/03/2015 and found to have an enlarged prostate, estimated size greater than 100 g, significant trabeculation and left small Hutch diverticuli without masses.  He also denies any recent fevers, chills, nausea or vomiting.  He does not have a family history of PCa.   IPSS    Row Name 01/22/19 1000         International Prostate Symptom Score   How  often have you had the sensation of not emptying your bladder?  Less than 1 in 5     How often have you had to urinate less than every two hours?  Less than 1 in 5 times     How often have you found you stopped and started again several times when you urinated?  Less than half the time     How often have you found it difficult to postpone urination?  Less than half the time     How often have you had a weak urinary stream?  Less than 1 in 5 times     How often have you had to strain to start urination?  Less than 1 in 5 times     How many times did you typically get up at night to urinate?  2 Times     Total IPSS Score  10       Quality of Life due to urinary symptoms   If you were to spend the rest of your life with your urinary condition just the way it is now how would you feel about that?  Mostly Satisfied        Score:  1-7 Mild 8-19 Moderate 20-35 Severe  PMH: Past Medical History:  Diagnosis Date  . Arthritis   . Benign prostatic hypertrophy   . Bursitis  of left shoulder   . CHF (congestive heart failure) ([place])   . Diabetes ([place])   . Dyspnea on exertion 11/01/2011  . Elevated PSA   . History of colon polyps   . History of colon polyps   . Hyperlipidemia   . Hypertension   . Melanoma ([place])    under arm  . Microscopic hematuria   . Osteoarthritis   . Retroperitoneal mass   . Stroke ([place]) 05/2014  . TIA (transient ischemic attack)   . Urinary retention   . UTI (urinary tract infection)   . Ventricular tachycardia (HCC)    RBB/LAHB ideopathic VT, 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    Surgical History: Past Surgical History:  Procedure Laterality Date  . CARDIOVERSION    . CARDIOVERSION N/A 03/07/2018   Procedure: CARDIOVERSION;  Surgeon: Wellington Hampshire, MD;  Location: [facility] ORS;  Service: Cardiovascular;  Laterality: N/A;  . COLONOSCOPY WITH PROPOFOL N/A 10/17/2015   Procedure: COLONOSCOPY WITH PROPOFOL;  Surgeon: Manya Silvas, MD;  Location: [facility] ENDOSCOPY;   Service: Endoscopy;  Laterality: N/A;  . ELECTROPHYSIOLOGIC STUDY N/A 12/07/2015   Procedure: V Tach Ablation;  Surgeon: Will Overfield Leeds, MD;  Location: [facility] CV LAB;  Service: Cardiovascular;  Laterality: N/A;  . JOINT REPLACEMENT     R TKR  . RADIOLOGY WITH ANESTHESIA N/A 05/18/2014   Procedure: RADIOLOGY WITH ANESTHESIA;  Surgeon: Rob Hickman, MD;  Location: [facility];  Service: Radiology;  Laterality: N/A;  . REPLACEMENT TOTAL KNEE    . REPLACEMENT TOTAL KNEE Left   . VASECTOMY      Home Medications:  Allergies as of 01/22/2019      Reactions   Ciprofloxacin Anaphylaxis   Levofloxacin Anaphylaxis   "throat closes"   Enalapril Maleate Swelling   Angioedema   Metoprolol Swelling   edema   Vasotec [enalaprilat] Other (See Comments)   Reaction: Angioedema   Sulfa Antibiotics Rash, Other (See Comments)   Hypotension      Medication List       Accurate as of January 22, 2019 10:49 AM. If you have any questions, ask your nurse or doctor.        amiodarone 200 MG tablet Commonly known as: PACERONE Take 1 tablet (200 mg) by mouth on Mondays, Wednesdays, and Fridays   apixaban 5 MG Tabs tablet Commonly known as: Eliquis Take 1 tablet (5 mg total) by mouth 2 (two) times daily. NEED APPT w/ Dr. Caryl Comes   atorvastatin 10 MG tablet Commonly known as: LIPITOR Take 10 mg by mouth daily.   finasteride 5 MG tablet Commonly known as: PROSCAR Take 1 tablet (5 mg total) by mouth daily.   levothyroxine 50 MCG tablet Commonly known as: SYNTHROID Take 50 mcg by mouth daily before breakfast.   mexiletine 150 MG capsule Commonly known as: MEXITIL TAKE 1 CAPSULE BY MOUTH TWICE DAILY   multivitamin with minerals Tabs tablet Take 1 tablet by mouth daily. One A Day 50+   tamsulosin 0.4 MG Caps capsule Commonly known as: FLOMAX Take 2 capsules (0.8 mg total) by mouth daily.   torsemide 10 MG tablet Commonly known as: DEMADEX Take 10 mg by mouth daily.   vitamin C 500 MG  tablet Commonly known as: ASCORBIC ACID Take 500 mg by mouth daily.   Vitamin D3 25 MCG (1000 UT) Caps Take 1,000 Units by mouth daily.       Allergies:  Allergies  Allergen Reactions  . Ciprofloxacin Anaphylaxis  . Levofloxacin Anaphylaxis    "  throat closes"  . Enalapril Maleate Swelling    Angioedema  . Metoprolol Swelling    edema  . Vasotec [Enalaprilat] Other (See Comments)    Reaction: Angioedema  . Sulfa Antibiotics Rash and Other (See Comments)    Hypotension    Family History: Family History  Problem Relation Age of Onset  . Stroke Father   . Hypertension Father   . Heart attack Neg Hx   . Kidney disease Neg Hx   . Prostate cancer Neg Hx   . Kidney cancer Neg Hx   . Bladder Cancer Neg Hx     Social History:  reports that he quit smoking about 48 years ago. His smoking use included cigarettes. He started smoking about 58 years ago. He has a 20.00 pack-year smoking history. He has never used smokeless tobacco. He reports current alcohol use. He reports that he does not use drugs.  ROS: UROLOGY Frequent Urination?: No Hard to postpone urination?: No Burning/pain with urination?: No Get up at night to urinate?: No Leakage of urine?: No Urine stream starts and stops?: No Trouble starting stream?: No Do you have to strain to urinate?: No Blood in urine?: No Urinary tract infection?: No Sexually transmitted disease?: No Injury to kidneys or bladder?: No Painful intercourse?: No Weak stream?: No Erection problems?: No Penile pain?: No  Gastrointestinal Nausea?: No Vomiting?: No Indigestion/heartburn?: No Diarrhea?: No Constipation?: No  Constitutional Fever: No Night sweats?: No Weight loss?: No Fatigue?: No  Skin Skin rash/lesions?: No Itching?: No  Eyes Blurred vision?: No Double vision?: No  Ears/Nose/Throat Sore throat?: No Sinus problems?: No  Hematologic/Lymphatic Swollen glands?: No Easy bruising?: No  Cardiovascular Leg  swelling?: No Chest pain?: No  Respiratory Cough?: No Shortness of breath?: No  Endocrine Excessive thirst?: No  Musculoskeletal Back pain?: No Joint pain?: No  Neurological Headaches?: No Dizziness?: No  Psychologic Depression?: No Anxiety?: No  Physical Exam: BP (!) 157/84 (BP Location: Left Arm, Patient Position: Sitting, Cuff Size: Normal)   Pulse 92   Ht 5\' 6"  (1.676 m)   Wt 178 lb (80.7 kg)   BMI 28.73 kg/m   Constitutional:  Well nourished. Alert and oriented, No acute distress. HEENT: [place] AT, moist mucus membranes.  Trachea midline, no masses. Cardiovascular: No clubbing, cyanosis, or edema. Respiratory: Normal respiratory effort, no increased work of breathing. GI: Abdomen is soft, non tender, non distended, no abdominal masses. Liver and spleen not palpable.  No hernias appreciated.  Stool sample for occult testing is not indicated.   GU: No CVA tenderness.  No bladder fullness or masses.  Patient with buried phallus.  Foreskin easily retracted.  Urethral meatus is patent.  No penile discharge. No penile lesions or rashes. Scrotum without lesions, cysts, rashes and/or edema.  Testicles are located scrotally bilaterally. No masses are appreciated in the testicles. Left and right epididymis are normal. Rectal: Patient with  normal sphincter tone. Anus and perineum without scarring or rashes. No rectal masses are appreciated. Prostate is approximately 70 grams, could only palpate the apex and midportion of the gland, right lobe > left lobe, no nodules are appreciated.  Neurologic: Grossly intact, no focal deficits, moving all 4 extremities. Psychiatric: Normal mood and affect.   Laboratory Data: Lab Results  Component Value Date   WBC 7.0 02/27/2018   HGB 15.5 02/27/2018   HCT 46.6 02/27/2018   MCV 96 02/27/2018   PLT 162 02/27/2018    Lab Results  Component Value Date   CREATININE 0.86  02/27/2018   PSA History:  5.5 ng/mL on 10/00/2005-bx was benign  1.3  ng/mL on 00/00/2013  1.4 ng/mL on 11/26/2012  1.6 ng/mL on 11/26/2013  0.9 ng/mL on 12/10/2014   Lab Results  Component Value Date   HGBA1C 6.3 (H) 05/19/2014    Lab Results  Component Value Date   TSH 7.500 (H) 02/21/2017       Component Value Date/Time   CHOL 124 05/19/2014 0510   HDL 38 (L) 05/19/2014 0510   CHOLHDL 3.3 05/19/2014 0510   VLDL 28 05/19/2014 0510   LDLCALC 58 05/19/2014 0510    Lab Results  Component Value Date   AST 20 02/27/2018   Lab Results  Component Value Date   ALT 20 02/27/2018   I have reviewed the labs.   Pertinent Imaging Results for orders placed or performed in visit on 01/22/19  Bladder Scan (Post Void Residual) in office  Result Value Ref Range   Scan Result 242     Assessment & Plan:    1. History of hematuria Hematuria work up completed in 2016 - findings positive for Bosniak #2 cyst No report of gross hematuria   2. BPH with LUTS: I PSS score is 10/2, it is slightly worse Continue tamsulosin 0.4 mg bid and finasteride 5 mg daily; refills given RTC in 12 months for IPSS score and exam   3. History of elevated PSA Discontinued screening due to age and co-morbidities    Return in about 1 year (around 01/22/2020) for IPSS, PVR and exam.  These notes generated with voice recognition software. I apologize for typographical errors.  Zara Council, PA-C  [facility] [address] [place], [place] [address] [phone]

## 2019-01-22 ENCOUNTER — Ambulatory Visit: Payer: Medicare Other | Admitting: Urology

## 2019-01-22 ENCOUNTER — Other Ambulatory Visit: Payer: Self-pay | Admitting: Internal Medicine

## 2019-01-22 ENCOUNTER — Other Ambulatory Visit: Payer: Self-pay

## 2019-01-22 ENCOUNTER — Encounter: Payer: Self-pay | Admitting: Urology

## 2019-01-22 VITALS — BP 157/84 | HR 92 | Ht 66.0 in | Wt 178.0 lb

## 2019-01-22 DIAGNOSIS — N138 Other obstructive and reflux uropathy: Secondary | ICD-10-CM

## 2019-01-22 DIAGNOSIS — Z87448 Personal history of other diseases of urinary system: Secondary | ICD-10-CM

## 2019-01-22 DIAGNOSIS — N401 Enlarged prostate with lower urinary tract symptoms: Secondary | ICD-10-CM

## 2019-01-22 LAB — BLADDER SCAN AMB NON-IMAGING: Scan Result: 242

## 2019-01-22 MED ORDER — FINASTERIDE 5 MG PO TABS: 5.0000 mg | ORAL_TABLET | Freq: Every day | ORAL | 3 refills | Status: DC

## 2019-01-22 MED ORDER — TAMSULOSIN HCL 0.4 MG PO CAPS: 0.8000 mg | ORAL_CAPSULE | Freq: Every day | ORAL | 3 refills | Status: DC

## 2019-02-10 ENCOUNTER — Other Ambulatory Visit: Admission: RE | Admit: 2019-02-10 | Payer: Medicare Other | Source: Ambulatory Visit

## 2019-02-13 ENCOUNTER — Ambulatory Visit
Admission: RE | Admit: 2019-02-13 | Payer: Medicare Other | Source: Home / Self Care | Admitting: Unknown Physician Specialty

## 2019-02-13 ENCOUNTER — Encounter: Admission: RE | Payer: Self-pay | Source: Home / Self Care

## 2019-02-13 SURGERY — COLONOSCOPY WITH PROPOFOL
Anesthesia: General

## 2019-02-27 ENCOUNTER — Telehealth: Payer: Self-pay | Admitting: Internal Medicine

## 2019-02-27 NOTE — Telephone Encounter (Signed)
Attempted to contact Austin Warren. No answer- I left a message to please call back.

## 2019-02-27 NOTE — Telephone Encounter (Signed)
I spoke with Mrs. Craine. She states she spoke with [facility] and that the mexiletine is on back order. They had #60 tablets in stock and gave him what they had.  He has ~ 1 months worth. I advised she can check with another pharmacy to see if they have this in stock, or we can wait until his appointment on 9/3 with Dr. Caryl Comes and readdress with the pharmacy at that time.  They would like to wait until his f/u with Dr. Caryl Comes. I advised this is the first I have heard of this, so I am unsure if the back order is due to the supplier for [facility] of if this is a national back order.

## 2019-02-27 NOTE — Telephone Encounter (Signed)
Pt c/o medication issue:  1. Name of Medication: mexiletine   2. How are you currently taking this medication (dosage and times per day)? 150 MG 2 times daily   3. Are you having a reaction (difficulty breathing--STAT)? No   4. What is your medication issue? Patient spouse calling.  States pharmacy is out of this medication - it is on backorder and does not know when will be received, patient is not out of this mediation yet but will need to know where to get or if medication will need to be changed for when refill is needed.  Please call to discuss.

## 2019-03-13 ENCOUNTER — Other Ambulatory Visit: Payer: Self-pay

## 2019-03-13 ENCOUNTER — Telehealth: Payer: Self-pay | Admitting: Internal Medicine

## 2019-03-13 ENCOUNTER — Other Ambulatory Visit
Admission: RE | Admit: 2019-03-13 | Discharge: 2019-03-13 | Disposition: A | Discharge status: Home / Self Care | Payer: Medicare Other | Source: Ambulatory Visit | Attending: Internal Medicine | Admitting: Internal Medicine

## 2019-03-13 DIAGNOSIS — Z20828 Contact with and (suspected) exposure to other viral communicable diseases: Secondary | ICD-10-CM | POA: Insufficient documentation

## 2019-03-13 DIAGNOSIS — Z01812 Encounter for preprocedural laboratory examination: Secondary | ICD-10-CM | POA: Diagnosis present

## 2019-03-13 LAB — SARS CORONAVIRUS 2 (TAT 6-24 HRS): SARS Coronavirus 2: NEGATIVE

## 2019-03-13 NOTE — Telephone Encounter (Signed)
Patients wife calling in to remind nurse about mexiletine 150mg  bid. Patients wife states their pharmacy is unable to obtain medication and asked patient to call in to remind nurse ahead of time.

## 2019-03-17 ENCOUNTER — Ambulatory Visit
Admission: RE | Admit: 2019-03-17 | Discharge: 2019-03-17 | Disposition: A | Discharge status: Home / Self Care | Payer: Medicare Other | Attending: Internal Medicine | Admitting: Internal Medicine

## 2019-03-17 ENCOUNTER — Ambulatory Visit: Payer: Medicare Other | Admitting: Certified Registered Nurse Anesthetist

## 2019-03-17 ENCOUNTER — Encounter: Payer: Self-pay | Admitting: Internal Medicine

## 2019-03-17 ENCOUNTER — Encounter
Admission: RE | Disposition: A | Discharge status: Home / Self Care | Payer: Self-pay | Source: Home / Self Care | Attending: Internal Medicine

## 2019-03-17 DIAGNOSIS — I509 Heart failure, unspecified: Secondary | ICD-10-CM | POA: Insufficient documentation

## 2019-03-17 DIAGNOSIS — Z85820 Personal history of malignant melanoma of skin: Secondary | ICD-10-CM | POA: Diagnosis not present

## 2019-03-17 DIAGNOSIS — M1990 Unspecified osteoarthritis, unspecified site: Secondary | ICD-10-CM | POA: Diagnosis not present

## 2019-03-17 DIAGNOSIS — E785 Hyperlipidemia, unspecified: Secondary | ICD-10-CM | POA: Diagnosis not present

## 2019-03-17 DIAGNOSIS — D123 Benign neoplasm of transverse colon: Secondary | ICD-10-CM | POA: Insufficient documentation

## 2019-03-17 DIAGNOSIS — Z79890 Hormone replacement therapy (postmenopausal): Secondary | ICD-10-CM | POA: Insufficient documentation

## 2019-03-17 DIAGNOSIS — Z7901 Long term (current) use of anticoagulants: Secondary | ICD-10-CM | POA: Insufficient documentation

## 2019-03-17 DIAGNOSIS — I110 Hypertensive heart disease with heart failure: Secondary | ICD-10-CM | POA: Insufficient documentation

## 2019-03-17 DIAGNOSIS — E119 Type 2 diabetes mellitus without complications: Secondary | ICD-10-CM | POA: Diagnosis not present

## 2019-03-17 DIAGNOSIS — Z86010 Personal history of colonic polyps: Secondary | ICD-10-CM | POA: Insufficient documentation

## 2019-03-17 DIAGNOSIS — Z79899 Other long term (current) drug therapy: Secondary | ICD-10-CM | POA: Diagnosis not present

## 2019-03-17 DIAGNOSIS — Z87891 Personal history of nicotine dependence: Secondary | ICD-10-CM | POA: Insufficient documentation

## 2019-03-17 DIAGNOSIS — Z8673 Personal history of transient ischemic attack (TIA), and cerebral infarction without residual deficits: Secondary | ICD-10-CM | POA: Insufficient documentation

## 2019-03-17 DIAGNOSIS — I428 Other cardiomyopathies: Secondary | ICD-10-CM | POA: Insufficient documentation

## 2019-03-17 DIAGNOSIS — Z881 Allergy status to other antibiotic agents status: Secondary | ICD-10-CM | POA: Insufficient documentation

## 2019-03-17 DIAGNOSIS — Z09 Encounter for follow-up examination after completed treatment for conditions other than malignant neoplasm: Secondary | ICD-10-CM | POA: Diagnosis not present

## 2019-03-17 DIAGNOSIS — K640 First degree hemorrhoids: Secondary | ICD-10-CM | POA: Diagnosis not present

## 2019-03-17 DIAGNOSIS — Z888 Allergy status to other drugs, medicaments and biological substances status: Secondary | ICD-10-CM | POA: Insufficient documentation

## 2019-03-17 DIAGNOSIS — N400 Enlarged prostate without lower urinary tract symptoms: Secondary | ICD-10-CM | POA: Diagnosis not present

## 2019-03-17 DIAGNOSIS — Z6830 Body mass index (BMI) 30.0-30.9, adult: Secondary | ICD-10-CM | POA: Insufficient documentation

## 2019-03-17 DIAGNOSIS — I4891 Unspecified atrial fibrillation: Secondary | ICD-10-CM | POA: Diagnosis not present

## 2019-03-17 DIAGNOSIS — Z882 Allergy status to sulfonamides status: Secondary | ICD-10-CM | POA: Insufficient documentation

## 2019-03-17 DIAGNOSIS — Z96659 Presence of unspecified artificial knee joint: Secondary | ICD-10-CM | POA: Insufficient documentation

## 2019-03-17 DIAGNOSIS — K5730 Diverticulosis of large intestine without perforation or abscess without bleeding: Secondary | ICD-10-CM | POA: Insufficient documentation

## 2019-03-17 HISTORY — PX: COLONOSCOPY WITH PROPOFOL: SHX5780

## 2019-03-17 SURGERY — COLONOSCOPY WITH PROPOFOL
Anesthesia: General

## 2019-03-17 MED ORDER — LIDOCAINE HCL (CARDIAC) PF 100 MG/5ML IV SOSY
PREFILLED_SYRINGE | INTRAVENOUS | Status: DC | PRN
  Administered 2019-03-17: 50 mg via INTRAVENOUS

## 2019-03-17 MED ORDER — PROPOFOL 500 MG/50ML IV EMUL
INTRAVENOUS | Status: DC | PRN
  Administered 2019-03-17: 150 ug/kg/min via INTRAVENOUS

## 2019-03-17 MED ORDER — PROPOFOL 10 MG/ML IV BOLUS
INTRAVENOUS | Status: AC
  Filled 2019-03-17: qty 20

## 2019-03-17 MED ORDER — PROPOFOL 500 MG/50ML IV EMUL
INTRAVENOUS | Status: AC
  Filled 2019-03-17: qty 50

## 2019-03-17 MED ORDER — SODIUM CHLORIDE 0.9 % IV SOLN
INTRAVENOUS | Status: DC
  Administered 2019-03-17: 11:00:00 1000 mL via INTRAVENOUS

## 2019-03-17 MED ORDER — PROPOFOL 10 MG/ML IV BOLUS
INTRAVENOUS | Status: DC | PRN
  Administered 2019-03-17: 40 mg via INTRAVENOUS

## 2019-03-17 MED ORDER — PHENYLEPHRINE HCL (PRESSORS) 10 MG/ML IV SOLN
INTRAVENOUS | Status: DC | PRN
  Administered 2019-03-17 (×4): 100 ug via INTRAVENOUS

## 2019-03-17 NOTE — Anesthesia Postprocedure Evaluation (Signed)
Anesthesia Post Note  Patient: Austin Warren  Procedure(s) Performed: COLONOSCOPY WITH PROPOFOL (N/A )  Patient location during evaluation: Endoscopy Anesthesia Type: General Level of consciousness: awake and alert Pain management: pain level controlled Vital Signs Assessment: post-procedure vital signs reviewed and stable Respiratory status: spontaneous breathing and respiratory function stable Cardiovascular status: stable Anesthetic complications: no     Last Vitals:  Vitals:   03/17/19 1120 03/17/19 1130  BP: (!) 84/45 (!) 87/56  Pulse: 67 68  Resp: 14 18  Temp: (!) 36.4 C   SpO2: 96% 96%    Last Pain:  Vitals:   03/17/19 1120  TempSrc: Tympanic  PainSc: 0-No pain                 KEPHART,WILLIAM K

## 2019-03-17 NOTE — H&P (Signed)
Outpatient short stay form Pre-procedure 03/17/2019 10:01 AM Teodoro K. Alice Reichert, M.D.  Primary Physician: Frazier Richards, MD  Reason for visit: Personal history of tubular adenomas of the colon  History of present illness: 78 year old male with a history of multiple adenomatous polyps (19 total over the last 20 years).  Last colonoscopy in March 2017 revealed adenomatous colon polyps.Patient denies change in bowel habits, rectal bleeding, weight loss or abdominal pain.  Patient takes Eliquis for personal history of atrial fibrillation and nonischemic cardiomyopathy, has held this for at least 3 days.  No current facility-administered medications for this encounter.   Current Outpatient Medications:  .  amiodarone (PACERONE) 200 MG tablet, Take 1 tablet (200 mg) by mouth on Mondays, Wednesdays, and Fridays, Disp: , Rfl:  .  apixaban (ELIQUIS) 5 MG TABS tablet, Take 1 tablet (5 mg total) by mouth 2 (two) times daily. NEED APPT w/ Dr. Caryl Comes, Disp: 60 tablet, Rfl: 0 .  Ascorbic Acid (VITAMIN C) 500 MG tablet, Take 500 mg by mouth daily.  , Disp: , Rfl:  .  atorvastatin (LIPITOR) 10 MG tablet, Take 10 mg by mouth daily. , Disp: , Rfl:  .  Cholecalciferol (VITAMIN D3) 1000 UNITS CAPS, Take 1,000 Units by mouth daily. , Disp: , Rfl:  .  finasteride (PROSCAR) 5 MG tablet, Take 1 tablet (5 mg total) by mouth daily., Disp: 90 tablet, Rfl: 3 .  levothyroxine (SYNTHROID, LEVOTHROID) 50 MCG tablet, Take 50 mcg by mouth daily before breakfast. , Disp: , Rfl:  .  mexiletine (MEXITIL) 150 MG capsule, TAKE 1 CAPSULE BY MOUTH TWICE DAILY, Disp: 180 capsule, Rfl: 0 .  Multiple Vitamin (MULTIVITAMIN WITH MINERALS) TABS tablet, Take 1 tablet by mouth daily. One A Day 50+, Disp: , Rfl:  .  tamsulosin (FLOMAX) 0.4 MG CAPS capsule, Take 2 capsules (0.8 mg total) by mouth daily., Disp: 180 capsule, Rfl: 3 .  torsemide (DEMADEX) 10 MG tablet, Take 10 mg by mouth daily. , Disp: , Rfl:   No medications prior to  admission.     Allergies  Allergen Reactions  . Ciprofloxacin Anaphylaxis  . Levofloxacin Anaphylaxis    "throat closes"  . Enalapril Maleate Swelling    Angioedema  . Metoprolol Swelling    edema  . Vasotec [Enalaprilat] Other (See Comments)    Reaction: Angioedema  . Sulfa Antibiotics Rash and Other (See Comments)    Hypotension     Past Medical History:  Diagnosis Date  . Arthritis   . Benign prostatic hypertrophy   . Bursitis of left shoulder   . CHF (congestive heart failure) ([place])   . Diabetes ([place])   . Dyspnea on exertion 11/01/2011  . Elevated PSA   . History of colon polyps   . History of colon polyps   . Hyperlipidemia   . Hypertension   . Melanoma ([place])    under arm  . Microscopic hematuria   . Osteoarthritis   . Retroperitoneal mass   . Stroke ([place]) 05/2014  . TIA (transient ischemic attack)   . Urinary retention   . UTI (urinary tract infection)   . Ventricular tachycardia (HCC)    RBB/LAHB ideopathic VT, 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    Review of systems:  Otherwise negative.    Physical Exam  Gen: Alert, oriented. Appears stated age.  HEENT: [place]/AT. PERRLA. Lungs: CTA, no wheezes. CV: RR nl S1, S2. Abd: soft, benign, no masses. BS+ Ext: No edema. Pulses 2+    Planned procedures: Proceed with  colonoscopy. The patient understands the nature of the planned procedure, indications, risks, alternatives and potential complications including but not limited to bleeding, infection, perforation, damage to internal organs and possible oversedation/side effects from anesthesia. The patient agrees and gives consent to proceed.  Please refer to procedure notes for findings, recommendations and patient disposition/instructions.     Teodoro K. Alice Reichert, M.D. Gastroenterology 03/17/2019  10:01 AM

## 2019-03-17 NOTE — Transfer of Care (Signed)
Immediate Anesthesia Transfer of Care Note  Patient: Austin Warren  Procedure(s) Performed: COLONOSCOPY WITH PROPOFOL (N/A )  Patient Location: PACU  Anesthesia Type:General  Level of Consciousness: awake, alert  and oriented  Airway & Oxygen Therapy: Patient Spontanous Breathing  Post-op Assessment: Report given to RN and Post -op Vital signs reviewed and stable  Post vital signs: Reviewed and stable  Last Vitals:  Vitals Value Taken Time  BP 84/45 03/17/19 1120  Temp 36.4 C 03/17/19 1120  Pulse 70 03/17/19 1120  Resp 14 03/17/19 1120  SpO2 96 % 03/17/19 1120  Vitals shown include unvalidated device data.  Last Pain:  Vitals:   03/17/19 1120  TempSrc: Tympanic  PainSc: 0-No pain         Complications: No apparent anesthesia complications

## 2019-03-17 NOTE — Anesthesia Post-op Follow-up Note (Signed)
Anesthesia QCDR form completed.        

## 2019-03-17 NOTE — Op Note (Signed)
[facility] Gastroenterology Patient Name: Austin Warren Procedure Date: 03/17/2019 10:57 AM MRN: [id number] Account #: [id number] Date of Birth: 09/22/1940 Admit Type: Outpatient Age: 78 Room: [facility] ENDO ROOM 2 Gender: Male Note Status: Finalized Procedure:            Colonoscopy Indications:          High risk colon cancer surveillance: Personal history                        of colonic polyps Providers:            Benay Pike. [place] MD, MD Medicines:            Propofol per Anesthesia Complications:        No immediate complications. Procedure:            Pre-Anesthesia Assessment:                       - The risks and benefits of the procedure and the                        sedation options and risks were discussed with the                        patient. All questions were answered and informed                        consent was obtained.                       - Patient identification and proposed procedure were                        verified prior to the procedure by the nurse. The                        procedure was verified in the procedure room.                       - ASA Grade Assessment: III - A patient with severe                        systemic disease.                       - After reviewing the risks and benefits, the patient                        was deemed in satisfactory condition to undergo the                        procedure.                       After obtaining informed consent, the colonoscope was                        passed under direct vision. Throughout the procedure,                        the patient's blood pressure, pulse, and oxygen  saturations were monitored continuously. The                        Colonoscope was introduced through the anus and                        advanced to the the cecum, identified by appendiceal                        orifice and ileocecal valve. The colonoscopy was              performed without difficulty. The patient tolerated the                        procedure well. The quality of the bowel preparation                        was fair. The ileocecal valve, appendiceal orifice, and                        rectum were photographed. Findings:      The perianal and digital rectal examinations were normal. Pertinent       negatives include normal sphincter tone and no palpable rectal lesions.      Many small and large-mouthed diverticula were found in the left colon.      A 3 mm polyp was found in the transverse colon. The polyp was sessile.       The polyp was removed with a cold biopsy forceps. Resection and       retrieval were complete.      Non-bleeding internal hemorrhoids were found during retroflexion. The       hemorrhoids were Grade I (internal hemorrhoids that do not prolapse).      The exam was otherwise without abnormality. Impression:           - Preparation of the colon was fair.                       - Diverticulosis in the left colon.                       - One 3 mm polyp in the transverse colon, removed with                        a cold biopsy forceps. Resected and retrieved.                       - Non-bleeding internal hemorrhoids.                       - The examination was otherwise normal. Recommendation:       - Patient has a contact number available for                        emergencies. The signs and symptoms of potential                        delayed complications were discussed with the patient.                        Return to normal  activities tomorrow. Written discharge                        instructions were provided to the patient.                       - Resume previous diet.                       - Continue present medications.                       - Await pathology results.                       - No repeat colonoscopy due to current age (21 years or                        older).                       -  Return to GI office PRN.                       - Resume Eliquis (apixaban) at prior dose today. Refer                        to managing physician for further adjustment of therapy.                       - Await pathology results.                       - The findings and recommendations were discussed with                        the patient. Procedure Code(s):    --- Professional ---                       [phone], Colonoscopy, flexible; with biopsy, single or                        multiple Diagnosis Code(s):    --- Professional ---                       K57.30, Diverticulosis of large intestine without                        perforation or abscess without bleeding                       K63.5, Polyp of colon                       K64.0, First degree hemorrhoids                       Z86.010, Personal history of colonic polyps CPT copyright 2019 American Medical Association. All rights reserved. The codes documented in this report are preliminary and upon coder review may  be revised to meet current compliance requirements. Efrain Sella MD, MD 03/17/2019 11:18:58 AM This report has been signed electronically. Number of Addenda: 0 Note Initiated On: 03/17/2019 10:57 AM Scope  Withdrawal Time: 0 hours 6 minutes 30 seconds  Total Procedure Duration: 0 hours 10 minutes 40 seconds  Estimated Blood Loss: Estimated blood loss: none.      [facility]

## 2019-03-17 NOTE — Interval H&P Note (Signed)
History and Physical Interval Note:  03/17/2019 10:06 AM  Austin Warren  has presented today for surgery, with the diagnosis of HX ADEN POLYPS.  The various methods of treatment have been discussed with the patient and family. After consideration of risks, benefits and other options for treatment, the patient has consented to  Procedure(s): COLONOSCOPY WITH PROPOFOL (N/A) as a surgical intervention.  The patient's history has been reviewed, patient examined, no change in status, stable for surgery.  I have reviewed the patient's chart and labs.  Questions were answered to the patient's satisfaction.     [place], [place]

## 2019-03-17 NOTE — Anesthesia Preprocedure Evaluation (Addendum)
Anesthesia Evaluation  Patient identified by MRN, date of birth, ID band Patient awake    Reviewed: Allergy & Precautions, NPO status , Patient's Chart, lab work & pertinent test results  History of Anesthesia Complications Negative for: history of anesthetic complications  Airway Mallampati: III       Dental   Pulmonary neg sleep apnea, neg COPD, Not current smoker, former smoker,           Cardiovascular hypertension, Pt. on medications (-) Past MI and (-) CHF (-) Valvular Problems/Murmurs     Neuro/Psych neg Seizures CVA (R sided weakness, speech difficulties), Residual Symptoms    GI/Hepatic Neg liver ROS, neg GERD  ,  Endo/Other  diabetes, Type 2, Oral Hypoglycemic Agents  Renal/GU negative Renal ROS     Musculoskeletal   Abdominal   Peds  Hematology   Anesthesia Other Findings   Reproductive/Obstetrics                            Anesthesia Physical Anesthesia Plan  ASA: III  Anesthesia Plan: General   Post-op Pain Management:    Induction:   PONV Risk Score and Plan: 2 and Propofol infusion and TIVA  Airway Management Planned: Nasal Cannula  Additional Equipment:   Intra-op Plan:   Post-operative Plan:   Informed Consent: I have reviewed the patients History and Physical, chart, labs and discussed the procedure including the risks, benefits and alternatives for the proposed anesthesia with the patient or authorized representative who has indicated his/her understanding and acceptance.       Plan Discussed with:   Anesthesia Plan Comments:         Anesthesia Quick Evaluation

## 2019-03-18 LAB — SURGICAL PATHOLOGY

## 2019-03-19 ENCOUNTER — Other Ambulatory Visit: Payer: Self-pay | Admitting: Internal Medicine

## 2019-03-19 ENCOUNTER — Other Ambulatory Visit: Payer: Self-pay

## 2019-03-19 ENCOUNTER — Ambulatory Visit: Payer: Medicare Other | Admitting: Internal Medicine

## 2019-03-19 ENCOUNTER — Encounter: Payer: Self-pay | Admitting: Internal Medicine

## 2019-03-19 VITALS — BP 110/64 | HR 88 | Ht 66.0 in | Wt 178.0 lb

## 2019-03-19 DIAGNOSIS — I480 Paroxysmal atrial fibrillation: Secondary | ICD-10-CM

## 2019-03-19 DIAGNOSIS — I428 Other cardiomyopathies: Secondary | ICD-10-CM | POA: Diagnosis not present

## 2019-03-19 DIAGNOSIS — Z01812 Encounter for preprocedural laboratory examination: Secondary | ICD-10-CM

## 2019-03-19 DIAGNOSIS — I4819 Other persistent atrial fibrillation: Secondary | ICD-10-CM | POA: Diagnosis not present

## 2019-03-19 DIAGNOSIS — I472 Ventricular tachycardia, unspecified: Secondary | ICD-10-CM

## 2019-03-19 DIAGNOSIS — Z79899 Other long term (current) drug therapy: Secondary | ICD-10-CM | POA: Diagnosis not present

## 2019-03-19 MED ORDER — APIXABAN 5 MG PO TABS: 5.0000 mg | ORAL_TABLET | Freq: Two times a day (BID) | ORAL | 1 refills | Status: DC

## 2019-03-19 MED ORDER — MEXILETINE HCL 150 MG PO CAPS: 150.0000 mg | ORAL_CAPSULE | Freq: Two times a day (BID) | ORAL | 1 refills | Status: DC

## 2019-03-19 MED ORDER — MEXILETINE HCL 150 MG PO CAPS: 150.0000 mg | ORAL_CAPSULE | Freq: Two times a day (BID) | ORAL | 3 refills | Status: DC

## 2019-03-19 MED ORDER — APIXABAN 5 MG PO TABS: 5.0000 mg | ORAL_TABLET | Freq: Two times a day (BID) | ORAL | 3 refills | Status: DC

## 2019-03-19 MED ORDER — AMIODARONE HCL 200 MG PO TABS: ORAL_TABLET | ORAL | 3 refills | Status: DC

## 2019-03-19 NOTE — H&P (View-Only) (Signed)
Patient Care Team: Kirk Ruths, MD as PCP - General (Unknown Physician Specialty)   HPI  Austin Warren is a 78 y.o. male Seen in followup for a ventricular tachycardia with a right bundle superior axis relatively narrow QRS complex and positive concordance suggesting a septal origin and possible verapamil sensitivity. Catheterization had demonstrated normal coronary arteries and normal left ventricular function. ECG was notable for marked reduction in voltage   Signal average Electrocardiogram was markedly abnormal.  MRI 4/16>>There is no significant change from the prior study with persistent basal inferior and inferolateral late gadolinium enhancement slightly more pronounced on the current study and a small focal late gadolinium enhancement at the point of inferior RV attachment to the LV wall.  He also underwent fat pad biopsy>> neg  Echocardiogram 11/15 demonstrated left ventricular hypertrophy with an ejection fraction of 45-50% with hypokinesis of the inferior apical myocardium   We initially tried him on a beta blocker prescription. He had recurrent ventricular tachycardia. He was put on verapamil.   Had recurrent tachycardia >>>  started on mexiletine.  5/17 he was transferred to [facility] in [place]. Evaluation included an echo with EF of 40-45%. Cardiac MR demonstrated EF of 43% with inferolateral and apical hypokinesis with inferolateral basilar scar which was if anything not worse compared to prior MR 4/16. He underwent EP testing. No scar was found and no ablation undertaken. Ventricular flutter was inducible. VT apparently was not. There have been discussions regarding ICD implantation. He elected to defer that decision He was discharged on increased mexiletine. He was seen again 6/17 for recurrent ventricular tachycardia. ECG was reviewed. Right bundle superior axis at a cycle length of just under 300 ms  6/17 started on amiodarone  Bradycardia required down titration of  amiodarone now on combination with mexiletine for ventricular tachycardia   He  had  stroke  11/15. He was found to be in atrial fibrillation. He was treated with TPA. Apixaban was initiated.  He has intellectual consequences and aphasia; these are improving    8/19 found to be in AFib DCCV>> to assess benefit of sinus rhythm   Date Cr K TSH Hgb LFTs PFTs  6/17  0.94 3.9 2.8  25    8/18 0.9 4.0 7.5  16   1/19   4.99     2/19 0.8 4.0 3.9  18   8/19 0.86 4.6  15.5    2/20 0.9 4.0 4.87  22      DATE Rate PR interval QRSduration Dose-Amio  10/18 62  162 112 200  1/18 54 212 112 200  1/19  66  206  104  200 x 5/w  12/19 58 220 106 200 x 5/w         After cardioversion both he is wife thought he had done better.  Over the last 3 months, he has noted heart rates faster than normal instead of in the 70s, 90s-110.  He has been reluctant to exercise.  He has some edema. The patient denies chest pain, nocturnal dyspnea, orthopnea.  There have been no palpitations, lightheadedness or syncope.   Patient denies symptoms of GI intolerance, sun sensitivity, neurological symptoms attributable to amiodarone.      Past Medical History:  Diagnosis Date  . Arthritis   . Benign prostatic hypertrophy   . Bursitis of left shoulder   . CHF (congestive heart failure) ([place])   . Diabetes ([place])   . Dyspnea on exertion 11/01/2011  . Elevated  PSA   . History of colon polyps   . History of colon polyps   . Hyperlipidemia   . Hypertension   . Melanoma ([place])    under arm  . Microscopic hematuria   . Osteoarthritis   . Retroperitoneal mass   . Stroke ([place]) 05/2014  . TIA (transient ischemic attack)   . Urinary retention   . UTI (urinary tract infection)   . Ventricular tachycardia (HCC)    RBB/LAHB ideopathic VT, 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    Past Surgical History:  Procedure Laterality Date  . CARDIOVERSION    . CARDIOVERSION N/A 03/07/2018   Procedure: CARDIOVERSION;  Surgeon: Wellington Hampshire, MD;  Location: [facility] ORS;  Service: Cardiovascular;  Laterality: N/A;  . COLONOSCOPY WITH PROPOFOL N/A 10/17/2015   Procedure: COLONOSCOPY WITH PROPOFOL;  Surgeon: Manya Silvas, MD;  Location: [facility] ENDOSCOPY;  Service: Endoscopy;  Laterality: N/A;  . COLONOSCOPY WITH PROPOFOL N/A 03/17/2019   Procedure: COLONOSCOPY WITH PROPOFOL;  Surgeon: Toledo, Benay Pike, MD;  Location: [facility] ENDOSCOPY;  Service: Gastroenterology;  Laterality: N/A;  . ELECTROPHYSIOLOGIC STUDY N/A 12/07/2015   Procedure: V Tach Ablation;  Surgeon: Will Zinni Leeds, MD;  Location: [place] CV LAB;  Service: Cardiovascular;  Laterality: N/A;  . JOINT REPLACEMENT     R TKR  . RADIOLOGY WITH ANESTHESIA N/A 05/18/2014   Procedure: RADIOLOGY WITH ANESTHESIA;  Surgeon: Rob Hickman, MD;  Location: [place];  Service: Radiology;  Laterality: N/A;  . REPLACEMENT TOTAL KNEE    . REPLACEMENT TOTAL KNEE Left   . VASECTOMY      Current Outpatient Medications  Medication Sig Dispense Refill  . amiodarone (PACERONE) 200 MG tablet Take 1 tablet (200 mg) by mouth on Mondays, Wednesdays, and Fridays    . apixaban (ELIQUIS) 5 MG TABS tablet Take 1 tablet (5 mg total) by mouth 2 (two) times daily. NEED APPT w/ Dr. Caryl Comes 60 tablet 0  . Ascorbic Acid (VITAMIN C) 500 MG tablet Take 500 mg by mouth daily.      Marland Kitchen atorvastatin (LIPITOR) 10 MG tablet Take 10 mg by mouth daily.     . Cholecalciferol (VITAMIN D3) 1000 UNITS CAPS Take 1,000 Units by mouth daily.     . finasteride (PROSCAR) 5 MG tablet Take 1 tablet (5 mg total) by mouth daily. 90 tablet 3  . levothyroxine (SYNTHROID, LEVOTHROID) 50 MCG tablet Take 50 mcg by mouth daily before breakfast.     . mexiletine (MEXITIL) 150 MG capsule TAKE 1 CAPSULE BY MOUTH TWICE DAILY 180 capsule 0  . Multiple Vitamin (MULTIVITAMIN WITH MINERALS) TABS tablet Take 1 tablet by mouth daily. One A Day 50+    . tamsulosin (FLOMAX) 0.4 MG CAPS capsule Take 2 capsules (0.8 mg total) by mouth  daily. 180 capsule 3  . torsemide (DEMADEX) 10 MG tablet Take 10 mg by mouth daily.      No current facility-administered medications for this visit.     Allergies  Allergen Reactions  . Ciprofloxacin Anaphylaxis  . Levofloxacin Anaphylaxis    "throat closes"  . Enalapril Maleate Swelling    Angioedema  . Metoprolol Swelling    edema  . Vasotec [Enalaprilat] Other (See Comments)    Reaction: Angioedema  . Sulfa Antibiotics Rash and Other (See Comments)    Hypotension    Review of Systems negative except from HPI and PMH  Physical Exam BP 110/64 (BP Location: Left Arm, Patient Position: Sitting, Cuff Size: Normal)   Pulse  88   Ht 5\' 6"  (1.676 m)   Wt 178 lb (80.7 kg)   SpO2 98%   BMI 28.73 kg/m  Well developed and nourished in no acute distress HENT normal Neck supple with JVP-  flat   Clear Irregularly regular rate and rhythm, no murmurs or gallops Abd-soft with active BS No Clubbing cyanosis trace on the right edema Skin-warm and dry A & Oriented  Grossly normal sensory and motor function  ECG atrial fibrillation at 88 -/12/46 Low voltage  Assessment and  Plan  Ventricular tachycardia  Low-voltage ECG  Cardiomyopathy-nonischemic abnormal signal average ECG/MRI negative fat pad biopsy  Persistent atrial fibrillation  Stroke   Sinus bradycardia  Hypothyroidism-iatrogenic-treated  High Risk Medication Surveillance,   Sound like he has been back in atrial fibrillation for about 3 months.  Modestly symptomatic.  Clinically tolerating amiodarone.  Will check amiodarone surveillance laboratories.  We will increase the amiodarone from 200 mg 3 times per week to 200 mg 7 times per week.  Anticipate reducing it to 5 times per week at post cardioversion follow-up in about 6 weeks.  He had stopped his anticoagulant for colonoscopy this week (good results).  Hence, we will anticipate cardioversion in 3 weeks.  Mild edema.  May be related to atrial  fibrillation.  We will continue him on his torsemide and evaluate post restoration of sinus rhythm.

## 2019-03-19 NOTE — Patient Instructions (Addendum)
Medication Instructions:  - Your physician has recommended you make the following change in your medication:   1) Increase amiodarone 200 mg- take 1 tablet by mouth once daily   If you need a refill on your cardiac medications before your next appointment, please call your pharmacy.   Lab work: - Lab work: Thursday 9/17 [facility] (12:00 pm- 2:00 pm), 1st desk on the right to check in-  BMP/ CBC  - COVID swab: Thursday 9/17 [facility] (12:30 pm-2:30 pm) Drive up test  If you have labs (blood work) drawn today and your tests are completely normal, you will receive your results only by: Marland Kitchen MyChart Message (if you have MyChart) OR . A paper copy in the mail If you have any lab test that is abnormal or we need to change your treatment, we will call you to review the results.  Testing/Procedures: - Your physician has recommended that you have a Cardioversion (DCCV). Electrical Cardioversion uses a jolt of electricity to your heart either through paddles or wired patches attached to your chest. This is a controlled, usually prescheduled, procedure. Defibrillation is done under light anesthesia in the hospital, and you usually go home the day of the procedure. This is done to get your heart back into a normal rhythm. You are not awake for the procedure. Please see the instruction sheet given to you today.  - You are scheduled for a Cardioversion on Monday 04/06/19 with Dr. Fletcher Anon.  Please arrive at the [facility] at 6:30 a.m. on the day of your procedure, 1st desk on the right to check in.  DIET INSTRUCTIONS:  Nothing to eat or drink after midnight the night before your procedure.         1) Labs/ COVID swab: Thrusday 9/17/  2) Medications:  You may take all of your regular medications the morning of the procedure with enough water to get them down safely unless listed below:  - HOLD torsemide the morning of your procedure  3) Must have a responsible person to drive you  home.  4) Bring a current list of your medications and current insurance cards.    If you have any questions after you get home, please call the office at [phone]   Follow-Up: At [facility], you and your health needs are our priority.  As part of our continuing mission to provide you with exceptional heart care, we have created designated Provider Care Teams.  These Care Teams include your primary Cardiologist (physician) and Advanced Practice Providers (APPs -  Physician Assistants and Nurse Practitioners) who all work together to provide you with the care you need, when you need it. . in 6 weeks with Dr. Caryl Comes  Any Other Special Instructions Will Be Listed Below (If Applicable). - N/A

## 2019-03-19 NOTE — Progress Notes (Signed)
Patient Care Team: Kirk Ruths, MD as PCP - General (Unknown Physician Specialty)   HPI  Austin Warren is a 78 y.o. male Seen in followup for a ventricular tachycardia with a right bundle superior axis relatively narrow QRS complex and positive concordance suggesting a septal origin and possible verapamil sensitivity. Catheterization had demonstrated normal coronary arteries and normal left ventricular function. ECG was notable for marked reduction in voltage   Signal average Electrocardiogram was markedly abnormal.  MRI 4/16>>There is no significant change from the prior study with persistent basal inferior and inferolateral late gadolinium enhancement slightly more pronounced on the current study and a small focal late gadolinium enhancement at the point of inferior RV attachment to the LV wall.  He also underwent fat pad biopsy>> neg  Echocardiogram 11/15 demonstrated left ventricular hypertrophy with an ejection fraction of 45-50% with hypokinesis of the inferior apical myocardium   We initially tried him on a beta blocker prescription. He had recurrent ventricular tachycardia. He was put on verapamil.   Had recurrent tachycardia >>>  started on mexiletine.  5/17 he was transferred to [facility] in [place]. Evaluation included an echo with EF of 40-45%. Cardiac MR demonstrated EF of 43% with inferolateral and apical hypokinesis with inferolateral basilar scar which was if anything not worse compared to prior MR 4/16. He underwent EP testing. No scar was found and no ablation undertaken. Ventricular flutter was inducible. VT apparently was not. There have been discussions regarding ICD implantation. He elected to defer that decision He was discharged on increased mexiletine. He was seen again 6/17 for recurrent ventricular tachycardia. ECG was reviewed. Right bundle superior axis at a cycle length of just under 300 ms  6/17 started on amiodarone  Bradycardia required down titration of  amiodarone now on combination with mexiletine for ventricular tachycardia   He  had  stroke  11/15. He was found to be in atrial fibrillation. He was treated with TPA. Apixaban was initiated.  He has intellectual consequences and aphasia; these are improving    8/19 found to be in AFib DCCV>> to assess benefit of sinus rhythm   Date Cr K TSH Hgb LFTs PFTs  6/17  0.94 3.9 2.8  25    8/18 0.9 4.0 7.5  16   1/19   4.99     2/19 0.8 4.0 3.9  18   8/19 0.86 4.6  15.5    2/20 0.9 4.0 4.87  22      DATE Rate PR interval QRSduration Dose-Amio  10/18 62  162 112 200  1/18 54 212 112 200  1/19  66  206  104  200 x 5/w  12/19 58 220 106 200 x 5/w         After cardioversion both he is wife thought he had done better.  Over the last 3 months, he has noted heart rates faster than normal instead of in the 70s, 90s-110.  He has been reluctant to exercise.  He has some edema. The patient denies chest pain, nocturnal dyspnea, orthopnea.  There have been no palpitations, lightheadedness or syncope.   Patient denies symptoms of GI intolerance, sun sensitivity, neurological symptoms attributable to amiodarone.      Past Medical History:  Diagnosis Date  . Arthritis   . Benign prostatic hypertrophy   . Bursitis of left shoulder   . CHF (congestive heart failure) ([place])   . Diabetes ([place])   . Dyspnea on exertion 11/01/2011  . Elevated  PSA   . History of colon polyps   . History of colon polyps   . Hyperlipidemia   . Hypertension   . Melanoma ([place])    under arm  . Microscopic hematuria   . Osteoarthritis   . Retroperitoneal mass   . Stroke ([place]) 05/2014  . TIA (transient ischemic attack)   . Urinary retention   . UTI (urinary tract infection)   . Ventricular tachycardia (HCC)    RBB/LAHB ideopathic VT, 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    Past Surgical History:  Procedure Laterality Date  . CARDIOVERSION    . CARDIOVERSION N/A 03/07/2018   Procedure: CARDIOVERSION;  Surgeon: Wellington Hampshire, MD;  Location: [facility] ORS;  Service: Cardiovascular;  Laterality: N/A;  . COLONOSCOPY WITH PROPOFOL N/A 10/17/2015   Procedure: COLONOSCOPY WITH PROPOFOL;  Surgeon: Manya Silvas, MD;  Location: [facility] ENDOSCOPY;  Service: Endoscopy;  Laterality: N/A;  . COLONOSCOPY WITH PROPOFOL N/A 03/17/2019   Procedure: COLONOSCOPY WITH PROPOFOL;  Surgeon: Toledo, Benay Pike, MD;  Location: [facility] ENDOSCOPY;  Service: Gastroenterology;  Laterality: N/A;  . ELECTROPHYSIOLOGIC STUDY N/A 12/07/2015   Procedure: V Tach Ablation;  Surgeon: Will Marines Leeds, MD;  Location: [place] CV LAB;  Service: Cardiovascular;  Laterality: N/A;  . JOINT REPLACEMENT     R TKR  . RADIOLOGY WITH ANESTHESIA N/A 05/18/2014   Procedure: RADIOLOGY WITH ANESTHESIA;  Surgeon: Rob Hickman, MD;  Location: [place];  Service: Radiology;  Laterality: N/A;  . REPLACEMENT TOTAL KNEE    . REPLACEMENT TOTAL KNEE Left   . VASECTOMY      Current Outpatient Medications  Medication Sig Dispense Refill  . amiodarone (PACERONE) 200 MG tablet Take 1 tablet (200 mg) by mouth on Mondays, Wednesdays, and Fridays    . apixaban (ELIQUIS) 5 MG TABS tablet Take 1 tablet (5 mg total) by mouth 2 (two) times daily. NEED APPT w/ Dr. Caryl Comes 60 tablet 0  . Ascorbic Acid (VITAMIN C) 500 MG tablet Take 500 mg by mouth daily.      Marland Kitchen atorvastatin (LIPITOR) 10 MG tablet Take 10 mg by mouth daily.     . Cholecalciferol (VITAMIN D3) 1000 UNITS CAPS Take 1,000 Units by mouth daily.     . finasteride (PROSCAR) 5 MG tablet Take 1 tablet (5 mg total) by mouth daily. 90 tablet 3  . levothyroxine (SYNTHROID, LEVOTHROID) 50 MCG tablet Take 50 mcg by mouth daily before breakfast.     . mexiletine (MEXITIL) 150 MG capsule TAKE 1 CAPSULE BY MOUTH TWICE DAILY 180 capsule 0  . Multiple Vitamin (MULTIVITAMIN WITH MINERALS) TABS tablet Take 1 tablet by mouth daily. One A Day 50+    . tamsulosin (FLOMAX) 0.4 MG CAPS capsule Take 2 capsules (0.8 mg total) by mouth  daily. 180 capsule 3  . torsemide (DEMADEX) 10 MG tablet Take 10 mg by mouth daily.      No current facility-administered medications for this visit.     Allergies  Allergen Reactions  . Ciprofloxacin Anaphylaxis  . Levofloxacin Anaphylaxis    "throat closes"  . Enalapril Maleate Swelling    Angioedema  . Metoprolol Swelling    edema  . Vasotec [Enalaprilat] Other (See Comments)    Reaction: Angioedema  . Sulfa Antibiotics Rash and Other (See Comments)    Hypotension    Review of Systems negative except from HPI and PMH  Physical Exam BP 110/64 (BP Location: Left Arm, Patient Position: Sitting, Cuff Size: Normal)   Pulse  88   Ht 5\' 6"  (1.676 m)   Wt 178 lb (80.7 kg)   SpO2 98%   BMI 28.73 kg/m  Well developed and nourished in no acute distress HENT normal Neck supple with JVP-  flat   Clear Irregularly regular rate and rhythm, no murmurs or gallops Abd-soft with active BS No Clubbing cyanosis trace on the right edema Skin-warm and dry A & Oriented  Grossly normal sensory and motor function  ECG atrial fibrillation at 88 -/12/46 Low voltage  Assessment and  Plan  Ventricular tachycardia  Low-voltage ECG  Cardiomyopathy-nonischemic abnormal signal average ECG/MRI negative fat pad biopsy  Persistent atrial fibrillation  Stroke   Sinus bradycardia  Hypothyroidism-iatrogenic-treated  High Risk Medication Surveillance,   Sound like he has been back in atrial fibrillation for about 3 months.  Modestly symptomatic.  Clinically tolerating amiodarone.  Will check amiodarone surveillance laboratories.  We will increase the amiodarone from 200 mg 3 times per week to 200 mg 7 times per week.  Anticipate reducing it to 5 times per week at post cardioversion follow-up in about 6 weeks.  He had stopped his anticoagulant for colonoscopy this week (good results).  Hence, we will anticipate cardioversion in 3 weeks.  Mild edema.  May be related to atrial  fibrillation.  We will continue him on his torsemide and evaluate post restoration of sinus rhythm.

## 2019-03-19 NOTE — Telephone Encounter (Signed)
The patient was seen in clinic today.

## 2019-04-02 ENCOUNTER — Other Ambulatory Visit: Payer: Self-pay

## 2019-04-02 ENCOUNTER — Other Ambulatory Visit
Admission: RE | Admit: 2019-04-02 | Discharge: 2019-04-02 | Disposition: A | Discharge status: Home / Self Care | Payer: Medicare Other | Source: Ambulatory Visit | Attending: Internal Medicine | Admitting: Internal Medicine

## 2019-04-02 DIAGNOSIS — Z20828 Contact with and (suspected) exposure to other viral communicable diseases: Secondary | ICD-10-CM | POA: Diagnosis not present

## 2019-04-02 DIAGNOSIS — Z79899 Other long term (current) drug therapy: Secondary | ICD-10-CM

## 2019-04-02 DIAGNOSIS — Z01812 Encounter for preprocedural laboratory examination: Secondary | ICD-10-CM | POA: Insufficient documentation

## 2019-04-02 DIAGNOSIS — I4819 Other persistent atrial fibrillation: Secondary | ICD-10-CM | POA: Insufficient documentation

## 2019-04-02 LAB — CBC WITH DIFFERENTIAL/PLATELET
Abs Immature Granulocytes: 0.02 10*3/uL (ref 0.00–0.07)
Basophils Absolute: 0 10*3/uL (ref 0.0–0.1)
Basophils Relative: 0 %
Eosinophils Absolute: 0.1 10*3/uL (ref 0.0–0.5)
Eosinophils Relative: 1 %
HCT: 47.4 % (ref 39.0–52.0)
Hemoglobin: 15.8 g/dL (ref 13.0–17.0)
Immature Granulocytes: 0 %
Lymphocytes Relative: 18 %
Lymphs Abs: 1.3 10*3/uL (ref 0.7–4.0)
MCH: 32.6 pg (ref 26.0–34.0)
MCHC: 33.3 g/dL (ref 30.0–36.0)
MCV: 97.9 fL (ref 80.0–100.0)
Monocytes Absolute: 0.7 10*3/uL (ref 0.1–1.0)
Monocytes Relative: 10 %
Neutro Abs: 5 10*3/uL (ref 1.7–7.7)
Neutrophils Relative %: 71 %
Platelets: 149 10*3/uL — ABNORMAL LOW (ref 150–400)
RBC: 4.84 MIL/uL (ref 4.22–5.81)
RDW: 13.2 % (ref 11.5–15.5)
WBC: 7.2 10*3/uL (ref 4.0–10.5)
nRBC: 0 % (ref 0.0–0.2)

## 2019-04-02 LAB — COMPREHENSIVE METABOLIC PANEL
ALT: 24 U/L (ref 0–44)
AST: 24 U/L (ref 15–41)
Albumin: 4.3 g/dL (ref 3.5–5.0)
Alkaline Phosphatase: 121 U/L (ref 38–126)
Anion gap: 11 (ref 5–15)
BUN: 17 mg/dL (ref 8–23)
CO2: 26 mmol/L (ref 22–32)
Calcium: 9 mg/dL (ref 8.9–10.3)
Chloride: 101 mmol/L (ref 98–111)
Creatinine, Ser: 0.96 mg/dL (ref 0.61–1.24)
GFR calc Af Amer: >60 mL/min (ref >60)
GFR calc non Af Amer: >60 mL/min (ref >60)
Glucose, Bld: 112 mg/dL — ABNORMAL HIGH (ref 70–99)
Potassium: 4.2 mmol/L (ref 3.5–5.1)
Sodium: 138 mmol/L (ref 135–145)
Total Bilirubin: 1.8 mg/dL — ABNORMAL HIGH (ref 0.3–1.2)
Total Protein: 7.4 g/dL (ref 6.5–8.1)

## 2019-04-02 LAB — TSH: TSH: 5.944 u[IU]/mL — ABNORMAL HIGH (ref 0.350–4.500)

## 2019-04-03 ENCOUNTER — Telehealth: Payer: Self-pay | Admitting: Internal Medicine

## 2019-04-03 LAB — SARS CORONAVIRUS 2 (TAT 6-24 HRS): SARS Coronavirus 2: NEGATIVE

## 2019-04-03 NOTE — Telephone Encounter (Signed)
Notes recorded by Deboraha Sprang, MD on 04/03/2019 at 10:13 AM EDT  Please inform patient that drug surveillance labs are normal x TSH remains up a little bit  Will send his TSH value to his PCP  May need uptitrations  thx

## 2019-04-06 ENCOUNTER — Ambulatory Visit (HOSPITAL_BASED_OUTPATIENT_CLINIC_OR_DEPARTMENT_OTHER)
Admission: RE | Admit: 2019-04-06 | Discharge: 2019-04-06 | Disposition: A | Discharge status: Home / Self Care | Payer: Medicare Other | Source: Home / Self Care | Attending: Cardiovascular Disease | Admitting: Cardiovascular Disease

## 2019-04-06 ENCOUNTER — Ambulatory Visit: Payer: Medicare Other | Admitting: Anesthesiology

## 2019-04-06 ENCOUNTER — Encounter
Admission: RE | Disposition: A | Discharge status: Home / Self Care | Payer: Self-pay | Source: Home / Self Care | Attending: Cardiovascular Disease

## 2019-04-06 ENCOUNTER — Other Ambulatory Visit: Payer: Self-pay

## 2019-04-06 DIAGNOSIS — E119 Type 2 diabetes mellitus without complications: Secondary | ICD-10-CM | POA: Insufficient documentation

## 2019-04-06 DIAGNOSIS — Z85820 Personal history of malignant melanoma of skin: Secondary | ICD-10-CM | POA: Insufficient documentation

## 2019-04-06 DIAGNOSIS — I4819 Other persistent atrial fibrillation: Secondary | ICD-10-CM | POA: Diagnosis not present

## 2019-04-06 DIAGNOSIS — Z79899 Other long term (current) drug therapy: Secondary | ICD-10-CM | POA: Insufficient documentation

## 2019-04-06 DIAGNOSIS — I509 Heart failure, unspecified: Secondary | ICD-10-CM | POA: Insufficient documentation

## 2019-04-06 DIAGNOSIS — Z79890 Hormone replacement therapy (postmenopausal): Secondary | ICD-10-CM | POA: Insufficient documentation

## 2019-04-06 DIAGNOSIS — Z96652 Presence of left artificial knee joint: Secondary | ICD-10-CM | POA: Insufficient documentation

## 2019-04-06 DIAGNOSIS — I69398 Other sequelae of cerebral infarction: Secondary | ICD-10-CM | POA: Insufficient documentation

## 2019-04-06 DIAGNOSIS — I480 Paroxysmal atrial fibrillation: Secondary | ICD-10-CM

## 2019-04-06 DIAGNOSIS — R200 Anesthesia of skin: Secondary | ICD-10-CM | POA: Insufficient documentation

## 2019-04-06 DIAGNOSIS — I472 Ventricular tachycardia: Secondary | ICD-10-CM | POA: Insufficient documentation

## 2019-04-06 DIAGNOSIS — I110 Hypertensive heart disease with heart failure: Secondary | ICD-10-CM | POA: Insufficient documentation

## 2019-04-06 DIAGNOSIS — E039 Hypothyroidism, unspecified: Secondary | ICD-10-CM | POA: Insufficient documentation

## 2019-04-06 DIAGNOSIS — Z87891 Personal history of nicotine dependence: Secondary | ICD-10-CM | POA: Insufficient documentation

## 2019-04-06 DIAGNOSIS — Z7901 Long term (current) use of anticoagulants: Secondary | ICD-10-CM | POA: Insufficient documentation

## 2019-04-06 HISTORY — PX: CARDIOVERSION: SHX1299

## 2019-04-06 SURGERY — CARDIOVERSION
Anesthesia: General

## 2019-04-06 MED ORDER — PROPOFOL 10 MG/ML IV BOLUS
INTRAVENOUS | Status: AC
  Filled 2019-04-06: qty 40

## 2019-04-06 MED ORDER — PROPOFOL 10 MG/ML IV BOLUS
INTRAVENOUS | Status: DC | PRN
  Administered 2019-04-06: 10 mg via INTRAVENOUS
  Administered 2019-04-06: 40 mg via INTRAVENOUS
  Administered 2019-04-06: 20 mg via INTRAVENOUS

## 2019-04-06 MED ORDER — SODIUM CHLORIDE 0.9 % IV SOLN
INTRAVENOUS | Status: DC
  Administered 2019-04-06: 07:00:00 via INTRAVENOUS

## 2019-04-06 NOTE — Interval H&P Note (Signed)
History and Physical Interval Note:  04/06/2019 7:48 AM  Austin Warren  has presented today for surgery, with the diagnosis of Cardioversion    Afib.  The various methods of treatment have been discussed with the patient and family. After consideration of risks, benefits and other options for treatment, the patient has consented to  Procedure(s): CARDIOVERSION (N/A) as a surgical intervention.  The patient's history has been reviewed, patient examined, no change in status, stable for surgery.  I have reviewed the patient's chart and labs.  Questions were answered to the patient's satisfaction.     [name]

## 2019-04-06 NOTE — Anesthesia Procedure Notes (Signed)
Date/Time: 04/06/2019 7:32 AM Performed by: Johnna Acosta, CRNA Pre-anesthesia Checklist: Patient identified, Emergency Drugs available, Suction available, Patient being monitored and Timeout performed Patient Re-evaluated:Patient Re-evaluated prior to induction Oxygen Delivery Method: Nasal cannula Preoxygenation: Pre-oxygenation with 100% oxygen Induction Type: IV induction

## 2019-04-06 NOTE — Transfer of Care (Signed)
Immediate Anesthesia Transfer of Care Note  Patient: Austin Warren  Procedure(s) Performed: CARDIOVERSION (N/A )  Patient Location: PACU  Anesthesia Type:General  Level of Consciousness: awake, drowsy and responds to stimulation  Airway & Oxygen Therapy: Patient Spontanous Breathing and Patient connected to nasal cannula oxygen  Post-op Assessment: Report given to RN and Post -op Vital signs reviewed and stable  Post vital signs: Reviewed and stable  Last Vitals:  Vitals Value Taken Time  BP 119/70 04/06/19 0745  Temp    Pulse 78 04/06/19 0747  Resp 16 04/06/19 0747  SpO2 97 % 04/06/19 0747  Vitals shown include unvalidated device data.  Last Pain:  Vitals:   04/06/19 0649  TempSrc: Oral  PainSc: 0-No pain         Complications: No apparent anesthesia complications

## 2019-04-06 NOTE — Anesthesia Postprocedure Evaluation (Signed)
Anesthesia Post Note  Patient: Austin Warren  Procedure(s) Performed: CARDIOVERSION (N/A )  Patient location during evaluation: Other (specials recovery) Anesthesia Type: General Level of consciousness: awake and alert and oriented Pain management: pain level controlled Vital Signs Assessment: post-procedure vital signs reviewed and stable Respiratory status: spontaneous breathing, nonlabored ventilation and respiratory function stable Cardiovascular status: blood pressure returned to baseline and stable Postop Assessment: no signs of nausea or vomiting Anesthetic complications: no     Last Vitals:  Vitals:   04/06/19 0800 04/06/19 0815  BP: 111/69 120/83  Pulse: 74 80  Resp: 14 20  Temp:    SpO2: 96% 96%    Last Pain:  Vitals:   04/06/19 0815  TempSrc:   PainSc: 0-No pain                 Yides Saidi

## 2019-04-06 NOTE — Anesthesia Post-op Follow-up Note (Signed)
Anesthesia QCDR form completed.        

## 2019-04-06 NOTE — CV Procedure (Signed)
Cardioversion note: A standard informed consent was obtained. Timeout was performed. The pads were placed in the anterior posterior fashion. The patient was given propofol by the anesthesia team.  Successful cardioversion was performed with a 150 J. The patient converted to sinus rhythm. Pre-and post EKGs were reviewed. The patient tolerated the procedure with no immediate complications.  Recommendations: Continue same medications and follow-up as planned with Dr. Caryl Comes.

## 2019-04-06 NOTE — Anesthesia Preprocedure Evaluation (Signed)
Anesthesia Evaluation  Patient identified by MRN, date of birth, ID band Patient awake    Reviewed: Allergy & Precautions, NPO status , Patient's Chart, lab work & pertinent test results  History of Anesthesia Complications Negative for: history of anesthetic complications  Airway Mallampati: II  TM Distance: >3 FB Neck ROM: Full    Dental no notable dental hx.    Pulmonary neg sleep apnea, neg COPD, former smoker,    breath sounds clear to auscultation- rhonchi (-) wheezing      Cardiovascular hypertension, Pt. on medications +CHF (EF 40-45%)  (-) CAD, (-) Past MI, (-) Cardiac Stents and (-) CABG + dysrhythmias Atrial Fibrillation  Rhythm:Regular Rate:Normal - Systolic murmurs and - Diastolic murmurs    Neuro/Psych neg Seizures TIACVA (R sided numbness), Residual Symptoms negative psych ROS   GI/Hepatic negative GI ROS, Neg liver ROS,   Endo/Other  diabetes  Renal/GU negative Renal ROS     Musculoskeletal  (+) Arthritis ,   Abdominal (+) - obese,   Peds  Hematology negative hematology ROS (+)   Anesthesia Other Findings Past Medical History: No date: Arthritis No date: Benign prostatic hypertrophy No date: Bursitis of left shoulder No date: CHF (congestive heart failure) (HCC) No date: Diabetes ([place]) 11/01/2011: Dyspnea on exertion No date: Elevated PSA No date: History of colon polyps No date: History of colon polyps No date: Hyperlipidemia No date: Hypertension No date: Melanoma ([place])     Comment:  under arm No date: Microscopic hematuria No date: Osteoarthritis No date: Retroperitoneal mass 05/2014: Stroke ([place]) No date: TIA (transient ischemic attack) No date: Urinary retention No date: UTI (urinary tract infection) No date: Ventricular tachycardia (HCC)     Comment:  RBB/LAHB ideopathic VT, 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   Reproductive/Obstetrics                              Anesthesia Physical Anesthesia Plan  ASA: III  Anesthesia Plan: General   Post-op Pain Management:    Induction: Intravenous  PONV Risk Score and Plan: 1 and Propofol infusion  Airway Management Planned: Natural Airway  Additional Equipment:   Intra-op Plan:   Post-operative Plan:   Informed Consent: I have reviewed the patients History and Physical, chart, labs and discussed the procedure including the risks, benefits and alternatives for the proposed anesthesia with the patient or authorized representative who has indicated his/her understanding and acceptance.     Dental advisory given  Plan Discussed with: CRNA and Anesthesiologist  Anesthesia Plan Comments:         Anesthesia Quick Evaluation

## 2019-04-07 ENCOUNTER — Other Ambulatory Visit: Payer: Self-pay

## 2019-04-07 ENCOUNTER — Inpatient Hospital Stay
Admission: EM | Admit: 2019-04-07 | Discharge: 2019-04-08 | DRG: 291 | Disposition: A | Discharge status: Home / Self Care | Payer: Medicare Other | Attending: Internal Medicine | Admitting: Internal Medicine

## 2019-04-07 ENCOUNTER — Emergency Department: Payer: Medicare Other

## 2019-04-07 ENCOUNTER — Encounter: Payer: Self-pay | Admitting: Cardiovascular Disease

## 2019-04-07 ENCOUNTER — Inpatient Hospital Stay (HOSPITAL_COMMUNITY)
Admit: 2019-04-07 | Discharge: 2019-04-07 | Disposition: A | Discharge status: Home / Self Care | Payer: Medicare Other | Attending: Physician Assistant | Admitting: Physician Assistant

## 2019-04-07 DIAGNOSIS — R7989 Other specified abnormal findings of blood chemistry: Secondary | ICD-10-CM | POA: Diagnosis not present

## 2019-04-07 DIAGNOSIS — I428 Other cardiomyopathies: Secondary | ICD-10-CM | POA: Diagnosis present

## 2019-04-07 DIAGNOSIS — I4510 Unspecified right bundle-branch block: Secondary | ICD-10-CM | POA: Diagnosis present

## 2019-04-07 DIAGNOSIS — Z7901 Long term (current) use of anticoagulants: Secondary | ICD-10-CM | POA: Diagnosis not present

## 2019-04-07 DIAGNOSIS — Z85820 Personal history of malignant melanoma of skin: Secondary | ICD-10-CM

## 2019-04-07 DIAGNOSIS — Z20828 Contact with and (suspected) exposure to other viral communicable diseases: Secondary | ICD-10-CM | POA: Diagnosis present

## 2019-04-07 DIAGNOSIS — I69398 Other sequelae of cerebral infarction: Secondary | ICD-10-CM

## 2019-04-07 DIAGNOSIS — E039 Hypothyroidism, unspecified: Secondary | ICD-10-CM | POA: Diagnosis present

## 2019-04-07 DIAGNOSIS — E785 Hyperlipidemia, unspecified: Secondary | ICD-10-CM | POA: Diagnosis present

## 2019-04-07 DIAGNOSIS — I472 Ventricular tachycardia: Secondary | ICD-10-CM | POA: Diagnosis present

## 2019-04-07 DIAGNOSIS — I69320 Aphasia following cerebral infarction: Secondary | ICD-10-CM

## 2019-04-07 DIAGNOSIS — R419 Unspecified symptoms and signs involving cognitive functions and awareness: Secondary | ICD-10-CM | POA: Diagnosis present

## 2019-04-07 DIAGNOSIS — Z96653 Presence of artificial knee joint, bilateral: Secondary | ICD-10-CM | POA: Diagnosis present

## 2019-04-07 DIAGNOSIS — E119 Type 2 diabetes mellitus without complications: Secondary | ICD-10-CM | POA: Diagnosis present

## 2019-04-07 DIAGNOSIS — Z87891 Personal history of nicotine dependence: Secondary | ICD-10-CM | POA: Diagnosis not present

## 2019-04-07 DIAGNOSIS — I4819 Other persistent atrial fibrillation: Secondary | ICD-10-CM | POA: Diagnosis present

## 2019-04-07 DIAGNOSIS — I110 Hypertensive heart disease with heart failure: Secondary | ICD-10-CM | POA: Diagnosis present

## 2019-04-07 DIAGNOSIS — Z8249 Family history of ischemic heart disease and other diseases of the circulatory system: Secondary | ICD-10-CM | POA: Diagnosis not present

## 2019-04-07 DIAGNOSIS — I340 Nonrheumatic mitral (valve) insufficiency: Secondary | ICD-10-CM | POA: Diagnosis not present

## 2019-04-07 DIAGNOSIS — Z79890 Hormone replacement therapy (postmenopausal): Secondary | ICD-10-CM

## 2019-04-07 DIAGNOSIS — I4902 Ventricular flutter: Secondary | ICD-10-CM | POA: Diagnosis present

## 2019-04-07 DIAGNOSIS — J810 Acute pulmonary edema: Secondary | ICD-10-CM

## 2019-04-07 DIAGNOSIS — I509 Heart failure, unspecified: Secondary | ICD-10-CM

## 2019-04-07 DIAGNOSIS — Z823 Family history of stroke: Secondary | ICD-10-CM

## 2019-04-07 DIAGNOSIS — I361 Nonrheumatic tricuspid (valve) insufficiency: Secondary | ICD-10-CM | POA: Diagnosis not present

## 2019-04-07 DIAGNOSIS — Z882 Allergy status to sulfonamides status: Secondary | ICD-10-CM | POA: Diagnosis not present

## 2019-04-07 DIAGNOSIS — N400 Enlarged prostate without lower urinary tract symptoms: Secondary | ICD-10-CM | POA: Diagnosis present

## 2019-04-07 DIAGNOSIS — Z881 Allergy status to other antibiotic agents status: Secondary | ICD-10-CM

## 2019-04-07 DIAGNOSIS — I5023 Acute on chronic systolic (congestive) heart failure: Secondary | ICD-10-CM | POA: Diagnosis present

## 2019-04-07 DIAGNOSIS — Z79899 Other long term (current) drug therapy: Secondary | ICD-10-CM

## 2019-04-07 DIAGNOSIS — I5022 Chronic systolic (congestive) heart failure: Secondary | ICD-10-CM | POA: Diagnosis present

## 2019-04-07 DIAGNOSIS — Z888 Allergy status to other drugs, medicaments and biological substances status: Secondary | ICD-10-CM

## 2019-04-07 DIAGNOSIS — J9601 Acute respiratory failure with hypoxia: Secondary | ICD-10-CM | POA: Diagnosis present

## 2019-04-07 LAB — BASIC METABOLIC PANEL
Anion gap: 13 (ref 5–15)
BUN: 19 mg/dL (ref 8–23)
CO2: 26 mmol/L (ref 22–32)
Calcium: 8.7 mg/dL — ABNORMAL LOW (ref 8.9–10.3)
Chloride: 98 mmol/L (ref 98–111)
Creatinine, Ser: 0.92 mg/dL (ref 0.61–1.24)
GFR calc Af Amer: >60 mL/min (ref >60)
GFR calc non Af Amer: >60 mL/min (ref >60)
Glucose, Bld: 117 mg/dL — ABNORMAL HIGH (ref 70–99)
Potassium: 3.8 mmol/L (ref 3.5–5.1)
Sodium: 137 mmol/L (ref 135–145)

## 2019-04-07 LAB — CBC WITH DIFFERENTIAL/PLATELET
Abs Immature Granulocytes: 0.06 10*3/uL (ref 0.00–0.07)
Basophils Absolute: 0.1 10*3/uL (ref 0.0–0.1)
Basophils Relative: 1 %
Eosinophils Absolute: 0.2 10*3/uL (ref 0.0–0.5)
Eosinophils Relative: 3 %
HCT: 49.5 % (ref 39.0–52.0)
Hemoglobin: 16.5 g/dL (ref 13.0–17.0)
Immature Granulocytes: 1 %
Lymphocytes Relative: 32 %
Lymphs Abs: 2.4 10*3/uL (ref 0.7–4.0)
MCH: 32.9 pg (ref 26.0–34.0)
MCHC: 33.3 g/dL (ref 30.0–36.0)
MCV: 98.8 fL (ref 80.0–100.0)
Monocytes Absolute: 0.5 10*3/uL (ref 0.1–1.0)
Monocytes Relative: 7 %
Neutro Abs: 4.2 10*3/uL (ref 1.7–7.7)
Neutrophils Relative %: 56 %
Platelets: 160 10*3/uL (ref 150–400)
RBC: 5.01 MIL/uL (ref 4.22–5.81)
RDW: 13.2 % (ref 11.5–15.5)
WBC: 7.4 10*3/uL (ref 4.0–10.5)
nRBC: 0 % (ref 0.0–0.2)

## 2019-04-07 LAB — COMPREHENSIVE METABOLIC PANEL
ALT: 23 U/L (ref 0–44)
AST: 28 U/L (ref 15–41)
Albumin: 4.2 g/dL (ref 3.5–5.0)
Alkaline Phosphatase: 111 U/L (ref 38–126)
Anion gap: 10 (ref 5–15)
BUN: 18 mg/dL (ref 8–23)
CO2: 23 mmol/L (ref 22–32)
Calcium: 8.5 mg/dL — ABNORMAL LOW (ref 8.9–10.3)
Chloride: 104 mmol/L (ref 98–111)
Creatinine, Ser: 0.98 mg/dL (ref 0.61–1.24)
GFR calc Af Amer: >60 mL/min (ref >60)
GFR calc non Af Amer: >60 mL/min (ref >60)
Glucose, Bld: 125 mg/dL — ABNORMAL HIGH (ref 70–99)
Potassium: 4.2 mmol/L (ref 3.5–5.1)
Sodium: 137 mmol/L (ref 135–145)
Total Bilirubin: 1.4 mg/dL — ABNORMAL HIGH (ref 0.3–1.2)
Total Protein: 7.2 g/dL (ref 6.5–8.1)

## 2019-04-07 LAB — TROPONIN I (HIGH SENSITIVITY)
Troponin I (High Sensitivity): 36 ng/L — ABNORMAL HIGH (ref <18)
Troponin I (High Sensitivity): 41 ng/L — ABNORMAL HIGH (ref <18)

## 2019-04-07 LAB — SARS CORONAVIRUS 2 BY RT PCR (HOSPITAL ORDER, PERFORMED IN ~~LOC~~ HOSPITAL LAB): SARS Coronavirus 2: NEGATIVE

## 2019-04-07 LAB — TSH: TSH: 6.724 u[IU]/mL — ABNORMAL HIGH (ref 0.350–4.500)

## 2019-04-07 LAB — BRAIN NATRIURETIC PEPTIDE: B Natriuretic Peptide: 493 pg/mL — ABNORMAL HIGH (ref 0.0–100.0)

## 2019-04-07 LAB — MAGNESIUM: Magnesium: 2 mg/dL (ref 1.7–2.4)

## 2019-04-07 LAB — PROTIME-INR
INR: 1.2 (ref 0.8–1.2)
Prothrombin Time: 15.3 seconds — ABNORMAL HIGH (ref 11.4–15.2)

## 2019-04-07 MED ORDER — ATORVASTATIN CALCIUM 10 MG PO TABS
10.0000 mg | ORAL_TABLET | Freq: Every evening | ORAL | Status: DC
  Administered 2019-04-07: 10 mg via ORAL
  Filled 2019-04-07: qty 1

## 2019-04-07 MED ORDER — FINASTERIDE 5 MG PO TABS
5.0000 mg | ORAL_TABLET | Freq: Every day | ORAL | Status: DC
  Administered 2019-04-07 – 2019-04-08 (×2): 5 mg via ORAL
  Filled 2019-04-07 (×2): qty 1

## 2019-04-07 MED ORDER — APIXABAN 5 MG PO TABS
5.0000 mg | ORAL_TABLET | Freq: Two times a day (BID) | ORAL | Status: DC
  Administered 2019-04-07 – 2019-04-08 (×3): 5 mg via ORAL
  Filled 2019-04-07 (×3): qty 1

## 2019-04-07 MED ORDER — ACETAMINOPHEN 650 MG RE SUPP: 650.0000 mg | Freq: Four times a day (QID) | RECTAL | Status: DC | PRN

## 2019-04-07 MED ORDER — VITAMIN D 25 MCG (1000 UNIT) PO TABS
1000.0000 [IU] | ORAL_TABLET | Freq: Every day | ORAL | Status: DC
  Administered 2019-04-07 – 2019-04-08 (×2): 1000 [IU] via ORAL
  Filled 2019-04-07 (×2): qty 1

## 2019-04-07 MED ORDER — FUROSEMIDE 10 MG/ML IJ SOLN
40.0000 mg | INTRAMUSCULAR | Status: AC
  Administered 2019-04-07: 40 mg via INTRAVENOUS
  Filled 2019-04-07: qty 4

## 2019-04-07 MED ORDER — ONDANSETRON HCL 4 MG PO TABS: 4.0000 mg | ORAL_TABLET | Freq: Four times a day (QID) | ORAL | Status: DC | PRN

## 2019-04-07 MED ORDER — DOCUSATE SODIUM 100 MG PO CAPS
100.0000 mg | ORAL_CAPSULE | Freq: Two times a day (BID) | ORAL | Status: DC
  Administered 2019-04-07 – 2019-04-08 (×3): 100 mg via ORAL
  Filled 2019-04-07 (×3): qty 1

## 2019-04-07 MED ORDER — AMIODARONE HCL 200 MG PO TABS
200.0000 mg | ORAL_TABLET | Freq: Every day | ORAL | Status: DC
  Administered 2019-04-07 – 2019-04-08 (×2): 200 mg via ORAL
  Filled 2019-04-07 (×2): qty 1

## 2019-04-07 MED ORDER — ONDANSETRON HCL 4 MG/2ML IJ SOLN: 4.0000 mg | Freq: Four times a day (QID) | INTRAMUSCULAR | Status: DC | PRN

## 2019-04-07 MED ORDER — MEXILETINE HCL 150 MG PO CAPS
150.0000 mg | ORAL_CAPSULE | Freq: Two times a day (BID) | ORAL | Status: DC
  Administered 2019-04-07 – 2019-04-08 (×3): 150 mg via ORAL
  Filled 2019-04-07 (×5): qty 1

## 2019-04-07 MED ORDER — FUROSEMIDE 10 MG/ML IJ SOLN
40.0000 mg | Freq: Four times a day (QID) | INTRAMUSCULAR | Status: AC
  Administered 2019-04-07 (×2): 40 mg via INTRAVENOUS
  Filled 2019-04-07 (×2): qty 4

## 2019-04-07 MED ORDER — TORSEMIDE 20 MG PO TABS
10.0000 mg | ORAL_TABLET | Freq: Every day | ORAL | Status: DC
  Administered 2019-04-08: 10 mg via ORAL
  Filled 2019-04-07: qty 1

## 2019-04-07 MED ORDER — TAMSULOSIN HCL 0.4 MG PO CAPS
0.8000 mg | ORAL_CAPSULE | Freq: Every day | ORAL | Status: DC
  Administered 2019-04-07 – 2019-04-08 (×2): 0.8 mg via ORAL
  Filled 2019-04-07 (×2): qty 2

## 2019-04-07 MED ORDER — ACETAMINOPHEN 325 MG PO TABS: 650.0000 mg | ORAL_TABLET | Freq: Four times a day (QID) | ORAL | Status: DC | PRN

## 2019-04-07 MED ORDER — NITROGLYCERIN 2 % TD OINT
1.0000 [in_us] | TOPICAL_OINTMENT | Freq: Once | TRANSDERMAL | Status: AC
  Administered 2019-04-07: 1 [in_us] via TOPICAL
  Filled 2019-04-07: qty 1

## 2019-04-07 MED ORDER — LEVOTHYROXINE SODIUM 50 MCG PO TABS
75.0000 ug | ORAL_TABLET | Freq: Every day | ORAL | Status: DC
  Administered 2019-04-07 – 2019-04-08 (×2): 75 ug via ORAL
  Filled 2019-04-07: qty 2
  Filled 2019-04-07: qty 1

## 2019-04-07 NOTE — ED Notes (Signed)
Pharmacy called and notified of need of medication verification.

## 2019-04-07 NOTE — ED Notes (Signed)
Patient and his family are requesting to be discharged due to long wait times on admission process. Gouru MD paged and notified.

## 2019-04-07 NOTE — ED Notes (Signed)
Patient resting on stretcher. Even and non labored respirations noted. Wife remains at bedside. Denies needs. Will continue to monitor.

## 2019-04-07 NOTE — ED Triage Notes (Signed)
PT had cardioversion yesterday for afib and woke up this am with shortness of breath. Co abd pain and chest tightness.

## 2019-04-07 NOTE — Progress Notes (Addendum)
Received call from central tele this pt has intermittent Second degree type 2 HB. Pt also has frequent PVC's. Dr. Brett Albino notified. Will CTM

## 2019-04-07 NOTE — ED Notes (Addendum)
Per Gouru MD, patient is not to be discharged today and that if patient needs to leave he can sign out AMA. Family and patient notified of same and are in agreement to stay at this time. Will continue to monitor.

## 2019-04-07 NOTE — ED Notes (Signed)
Cardiology at bedside.

## 2019-04-07 NOTE — H&P (Signed)
Austin Warren is an 78 y.o. male.   Chief Complaint: Shortness of breath HPI: The patient with past medical history of CHF, hyperlipidemia, hypertension and diet-controlled diabetes presents to the emergency department complaining of shortness of breath.  The patient underwent planned cardioversion yesterday for atrial fibrillation with rapid ventricular rate.  He felt well following the procedure but awoke this evening with shortness of breath.  The patient denies chest pain, nausea, vomiting or diaphoresis.  Chest x-ray showed pulmonary edema as well as cardiomegaly.  Telemetry within the emergency department showed sinus tachycardia.  The patient was placed on BiPAP prior to the emergency department staff called the hospitalist service for admission.  Past Medical History:  Diagnosis Date  . Arthritis   . Benign prostatic hypertrophy   . Bursitis of left shoulder   . CHF (congestive heart failure) ([place])   . Diabetes ([place])   . Dyspnea on exertion 11/01/2011  . Elevated PSA   . History of colon polyps   . History of colon polyps   . Hyperlipidemia   . Hypertension   . Melanoma ([place])    under arm  . Microscopic hematuria   . Osteoarthritis   . Retroperitoneal mass   . Stroke ([place]) 05/2014  . TIA (transient ischemic attack)   . Urinary retention   . UTI (urinary tract infection)   . Ventricular tachycardia (HCC)    RBB/LAHB ideopathic VT, 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    Past Surgical History:  Procedure Laterality Date  . CARDIOVERSION    . CARDIOVERSION N/A 03/07/2018   Procedure: CARDIOVERSION;  Surgeon: Wellington Hampshire, MD;  Location: [facility] ORS;  Service: Cardiovascular;  Laterality: N/A;  . COLONOSCOPY WITH PROPOFOL N/A 10/17/2015   Procedure: COLONOSCOPY WITH PROPOFOL;  Surgeon: Manya Silvas, MD;  Location: [facility] ENDOSCOPY;  Service: Endoscopy;  Laterality: N/A;  . COLONOSCOPY WITH PROPOFOL N/A 03/17/2019   Procedure: COLONOSCOPY WITH PROPOFOL;  Surgeon: Toledo, Benay Pike,  MD;  Location: [facility] ENDOSCOPY;  Service: Gastroenterology;  Laterality: N/A;  . ELECTROPHYSIOLOGIC STUDY N/A 12/07/2015   Procedure: V Tach Ablation;  Surgeon: Will Doswell Leeds, MD;  Location: [facility] CV LAB;  Service: Cardiovascular;  Laterality: N/A;  . JOINT REPLACEMENT     R TKR  . RADIOLOGY WITH ANESTHESIA N/A 05/18/2014   Procedure: RADIOLOGY WITH ANESTHESIA;  Surgeon: Rob Hickman, MD;  Location: [facility];  Service: Radiology;  Laterality: N/A;  . REPLACEMENT TOTAL KNEE    . REPLACEMENT TOTAL KNEE Left   . VASECTOMY      Family History  Problem Relation Age of Onset  . Stroke Father   . Hypertension Father   . Heart attack Neg Hx   . Kidney disease Neg Hx   . Prostate cancer Neg Hx   . Kidney cancer Neg Hx   . Bladder Cancer Neg Hx    Social History:  reports that he quit smoking about 49 years ago. His smoking use included cigarettes. He started smoking about 59 years ago. He has a 20.00 pack-year smoking history. He has never used smokeless tobacco. He reports current alcohol use. He reports that he does not use drugs.  Allergies:  Allergies  Allergen Reactions  . Ciprofloxacin Anaphylaxis  . Levofloxacin Anaphylaxis    "throat closes"  . Enalapril Maleate Swelling    Angioedema  . Metoprolol Swelling    edema  . Vasotec [Enalaprilat] Other (See Comments)    Reaction: Angioedema  . Sulfa Antibiotics Rash and Other (See  Comments)    Hypotension    Prior to Admission medications   Medication Sig Start Date End Date Taking? Authorizing Provider  amiodarone (PACERONE) 200 MG tablet Take 1 tablet (200 mg) by mouth once daily 03/19/19  Yes Deboraha Sprang, MD  apixaban (ELIQUIS) 5 MG TABS tablet Take 1 tablet (5 mg total) by mouth 2 (two) times daily. 03/19/19  Yes Deboraha Sprang, MD  Ascorbic Acid (VITAMIN C) 500 MG tablet Take 500 mg by mouth daily.     Yes [provider]  atorvastatin (LIPITOR) 10 MG tablet Take 10 mg by mouth every evening.  03/07/15   Yes [provider]  Cholecalciferol (VITAMIN D3) 1000 UNITS CAPS Take 1,000 Units by mouth daily.    Yes [provider]  docusate sodium (COLACE) 50 MG capsule Take 50 mg by mouth 2 (two) times daily.   Yes [provider]  finasteride (PROSCAR) 5 MG tablet Take 1 tablet (5 mg total) by mouth daily. 01/22/19  Yes McGowan, Hunt Oris, PA-C  levothyroxine (SYNTHROID) 75 MCG tablet Take 75 mcg by mouth daily before breakfast.   Yes [provider]  mexiletine (MEXITIL) 150 MG capsule Take 1 capsule (150 mg total) by mouth 2 (two) times daily. 03/19/19  Yes Deboraha Sprang, MD  Multiple Vitamin (MULTIVITAMIN WITH MINERALS) TABS tablet Take 1 tablet by mouth daily. One A Day 50+   Yes [provider]  tamsulosin (FLOMAX) 0.4 MG CAPS capsule Take 2 capsules (0.8 mg total) by mouth daily. 01/22/19  Yes McGowan, Larene Beach A, PA-C  torsemide (DEMADEX) 10 MG tablet Take 10 mg by mouth daily.  02/17/13  Yes [provider]     Results for orders placed or performed during the hospital encounter of 04/07/19 (from the past 48 hour(s))  CBC with Differential/Platelet     Status: None   Collection Time: 04/07/19  2:48 AM  Result Value Ref Range   WBC 7.4 4.0 - 10.5 K/uL   RBC 5.01 4.22 - 5.81 MIL/uL   Hemoglobin 16.5 13.0 - 17.0 g/dL   HCT 49.5 39.0 - 52.0 %   MCV 98.8 80.0 - 100.0 fL   MCH 32.9 26.0 - 34.0 pg   MCHC 33.3 30.0 - 36.0 g/dL   RDW 13.2 11.5 - 15.5 %   Platelets 160 150 - 400 K/uL   nRBC 0.0 0.0 - 0.2 %   Neutrophils Relative % 56 %   Neutro Abs 4.2 1.7 - 7.7 K/uL   Lymphocytes Relative 32 %   Lymphs Abs 2.4 0.7 - 4.0 K/uL   Monocytes Relative 7 %   Monocytes Absolute 0.5 0.1 - 1.0 K/uL   Eosinophils Relative 3 %   Eosinophils Absolute 0.2 0.0 - 0.5 K/uL   Basophils Relative 1 %   Basophils Absolute 0.1 0.0 - 0.1 K/uL   Immature Granulocytes 1 %   Abs Immature Granulocytes 0.06 0.00 - 0.07 K/uL    Comment: Performed at [facility], [place], [place] [address]  Troponin I (High Sensitivity)     Status: Abnormal   Collection Time: 04/07/19  2:48 AM  Result Value Ref Range   Troponin I (High Sensitivity) 36 (H) <18 ng/L    Comment: (NOTE) Elevated high sensitivity troponin I (hsTnI) values and significant  changes across serial measurements may suggest ACS but many other  chronic and acute conditions are known to elevate hsTnI results.  Refer to the "Links" section for chest  pain algorithms and additional  guidance. Performed at [facility], [address] [place], [place] [address]   Brain natriuretic peptide     Status: Abnormal   Collection Time: 04/07/19  2:48 AM  Result Value Ref Range   B Natriuretic Peptide 493.0 (H) 0.0 - 100.0 pg/mL    Comment: Performed at [facility], [address] [place], [place] [address]  Comprehensive metabolic panel     Status: Abnormal   Collection Time: 04/07/19  2:48 AM  Result Value Ref Range   Sodium 137 135 - 145 mmol/L   Potassium 4.2 3.5 - 5.1 mmol/L   Chloride 104 98 - 111 mmol/L   CO2 23 22 - 32 mmol/L   Glucose, Bld 125 (H) 70 - 99 mg/dL   BUN 18 8 - 23 mg/dL   Creatinine, Ser 0.98 0.61 - 1.24 mg/dL   Calcium 8.5 (L) 8.9 - 10.3 mg/dL   Total Protein 7.2 6.5 - 8.1 g/dL   Albumin 4.2 3.5 - 5.0 g/dL   AST 28 15 - 41 U/L   ALT 23 0 - 44 U/L   Alkaline Phosphatase 111 38 - 126 U/L   Total Bilirubin 1.4 (H) 0.3 - 1.2 mg/dL   GFR calc non Af Amer >60 >60 mL/min   GFR calc Af Amer >60 >60 mL/min   Anion gap 10 5 - 15    Comment: Performed at [facility], [address] [place], [place] [address]  Protime-INR     Status: Abnormal   Collection Time: 04/07/19  2:48 AM  Result Value Ref Range   Prothrombin Time 15.3 (H) 11.4 - 15.2 seconds   INR 1.2 0.8 - 1.2    Comment: (NOTE) INR goal varies based on device and disease states. Performed at [facility], [address] [place], [place]  [address]    Dg Chest Portable 1 View  Result Date: 04/07/2019 CLINICAL DATA:  78 year old male with shortness of breath. EXAM: PORTABLE CHEST 1 VIEW COMPARISON:  Chest radiograph dated 12/01/2015 FINDINGS: There is cardiomegaly with vascular congestion and edema. Pneumonia is not excluded. Clinical correlation is recommended. No pleural effusion pneumothorax. No acute osseous pathology. IMPRESSION: Cardiomegaly with findings of congestive heart failure. Pneumonia is not excluded. Electronically Signed   By: Anner Crete M.D.   On: 04/07/2019 03:28    Review of Systems  Constitutional: Negative for chills and fever.  HENT: Negative for sore throat and tinnitus.   Eyes: Negative for blurred vision and redness.  Respiratory: Positive for shortness of breath. Negative for cough.   Cardiovascular: Negative for chest pain, palpitations, orthopnea and PND.  Gastrointestinal: Negative for abdominal pain, diarrhea, nausea and vomiting.  Genitourinary: Negative for dysuria, frequency and urgency.  Musculoskeletal: Negative for joint pain and myalgias.  Skin: Negative for rash.       No lesions  Neurological: Negative for speech change, focal weakness and weakness.  Endo/Heme/Allergies: Does not bruise/bleed easily.       No temperature intolerance  Psychiatric/Behavioral: Negative for depression and suicidal ideas.    Blood pressure (!) 139/99, pulse (!) 107, temperature (!) 97.2 F (36.2 C), temperature source Oral, resp. rate 17, weight 80.7 kg, SpO2 96 %. Physical Exam  Vitals reviewed. Constitutional: He is oriented to person, place, and time. He appears well-developed and well-nourished. No distress.  HENT:  Head: Normocephalic and atraumatic.  Mouth/Throat: Oropharynx is clear and moist.  Eyes: Pupils are equal, round, and reactive to light. Conjunctivae  and EOM are normal. No scleral icterus.  Neck: Normal range of motion. Neck supple. No JVD present. No tracheal deviation present. No  thyromegaly present.  Cardiovascular: Normal rate, regular rhythm and normal heart sounds. Exam reveals no gallop and no friction rub.  No murmur heard. Respiratory: Breath sounds normal. He is in respiratory distress.  BiPAP  GI: Soft. Bowel sounds are normal. He exhibits no distension.  Genitourinary:    Genitourinary Comments: Deferred   Musculoskeletal: Normal range of motion.        General: No edema.  Lymphadenopathy:    He has no cervical adenopathy.  Neurological: He is alert and oriented to person, place, and time. No cranial nerve deficit.  Skin: Skin is warm and dry. No rash noted. No erythema.  Psychiatric: He has a normal mood and affect. His behavior is normal. Judgment and thought content normal.     Assessment/Plan This is an 78 year old male admitted for CHF exacerbation. 1.  CHF: Acute on chronic; systolic.  Last EF 40 to 45%.  The patient is received Lasix and we will continue to diurese until he may resume oral torsemide tomorrow.  Continue BiPAP until work of breathing is significantly improved.  Wean oxygen as tolerated.  Consult cardiology for further guidance. 2.  Atrial fibrillation: Status post cardioversion yesterday; currently sinus tachycardia.  Continue amiodarone and mexiletine.  Eliquis for anticoagulation. 3.  Hypertension: Controlled; continue to monitor 4.  Hypotension: Check TSH; continue Synthroid 5.  Hyperlipidemia: Continue statin therapy 6.  BPH: Continue tamsulosin 7.  DVT prophylaxis: As above 8.  GI prophylaxis: None The patient is a full code.  Time spent on admission orders and patient care approximately 45 minutes  Harrie Foreman, MD 04/07/2019, 4:07 AM

## 2019-04-07 NOTE — ED Notes (Signed)
Attempting to call report at this time. 

## 2019-04-07 NOTE — ED Notes (Signed)
Pt placed on zoll and bipap at this time. Pt sats and work of breathing improved at this time.

## 2019-04-07 NOTE — ED Notes (Signed)
Lunch tray delivered at this time.

## 2019-04-07 NOTE — ED Provider Notes (Signed)
[facility] Emergency Department Provider Note  ____________________________________________   First MD Initiated Contact with Patient 04/07/19 0404     (approximate)  I have reviewed the triage vital signs and the nursing notes.   HISTORY  Chief Complaint Shortness of Breath  Level 5 caveat:  history/ROS limited by acute/critical illness  HPI Austin Warren is a 78 y.o. male with medical history as listed below and who had electrical cardioversion yesterday for A. fib who presents  by private vehicle for evaluation of acute onset and severe shortness of breath.  He did fine after his cardioversion yesterday (Dr. Caryl Comes is his cardiologist).  He was at home and then awoke from sleep with severe shortness of breath.  He felt like he could not catch his breath and sitting up helped a little bit but not substantially.  He waited about 30 minutes and then woke up his wife to bring him to the emergency department.  His symptoms were severe and he has no prior history of lung disease or heart failure according to his wife.  I was called to his room immediately after he was placed in the exam room and when I saw him he was using accessory muscles and having difficulty speaking in full sentences.  He denies chest pain but does have a sense of heaviness over the left side of his chest.  He has not had prior heart attacks but has had a stroke in the past for which she is takes Eliquis and was thought to be secondary to his atrial fibrillation which was previously undiagnosed.  He has not had any infectious symptoms recently and was tested 5 days ago for COVID-19 has preprocedural work-up prior to cardioversion.        Past Medical History:  Diagnosis Date  . Arthritis   . Benign prostatic hypertrophy   . Bursitis of left shoulder   . CHF (congestive heart failure) ([place])   . Diabetes ([place])   . Dyspnea on exertion 11/01/2011  . Elevated PSA   . History of colon  polyps   . History of colon polyps   . Hyperlipidemia   . Hypertension   . Melanoma ([place])    under arm  . Microscopic hematuria   . Osteoarthritis   . Retroperitoneal mass   . Stroke ([place]) 05/2014  . TIA (transient ischemic attack)   . Urinary retention   . UTI (urinary tract infection)   . Ventricular tachycardia (HCC)    RBB/LAHB ideopathic VT, 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    Patient Active Problem List   Diagnosis Date Noted  . Acute on chronic systolic CHF (congestive heart failure) ([place]) 04/07/2019  . BPH with obstruction/lower urinary tract symptoms 12/18/2015  . Ventricular tachyarrhythmia ([place]) 12/01/2015  . VT (ventricular tachycardia) ([place]) 12/01/2015  . Arterial hypotension   . Urine retention   . Malaise and fatigue   . Encounter for general adult medical examination without abnormal findings 03/10/2015  . Arthritis 03/07/2015  . Microscopic hematuria 02/14/2015  . Bilateral renal cysts 02/14/2015  . 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 Amyloidosis ([place])   . Type 2 diabetes mellitus with other circulatory complications ([place]) [id number]  . HLD (hyperlipidemia) 07/02/2014  . Ischemic stroke ([place]) 06/21/2014  . Atrial fibrillation, chronic 06/19/2014  . 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 Stroke ([place])   . Atrial fibrillation ([place]) 05/18/2014  . CVA (cerebral vascular accident) ([place]) 05/18/2014  . Essential hypertension 05/18/2014  .  Diabetes mellitus ([place]) 05/18/2014  . A-fib ([place]) 05/18/2014  . Cerebral vascular accident ([place]) 05/18/2014  . Cerebrovascular accident (CVA) ([place]) 05/18/2014  . Acute ischemic stroke ([place])   . Benign prostatic hyperplasia with urinary obstruction 12/19/2013  . Diabetes ([place]) 12/19/2013  . Type 2 diabetes mellitus with other specified complication ([place]) [id number]  . Retroperitoneal mass 12/19/2013  . Type 2 diabetes mellitus ([place]) 12/19/2013  . low-voltage ECG 03/05/2013  . Hypersomnolence  03/05/2013  . Morbid obesity ([place]) 03/05/2013  . Obesity (BMI 30-39.9) 03/05/2013  . Difficulty staying awake 03/05/2013  . Abnormal prostate specific antigen 11/28/2012  . Elevated prostate specific antigen (PSA) 11/28/2012  . Dyspnea on exertion 11/01/2011  . Edema 11/01/2011  . Ventricular tachycardia ([place]) 12/01/2010  . Abnormal  signal averaged electrocardiogram 12/01/2010  . 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 CARDIOVERSION    . CARDIOVERSION N/A 03/07/2018   Procedure: CARDIOVERSION;  Surgeon: Wellington Hampshire, MD;  Location: [facility] ORS;  Service: Cardiovascular;  Laterality: N/A;  . COLONOSCOPY WITH PROPOFOL N/A 10/17/2015   Procedure: COLONOSCOPY WITH PROPOFOL;  Surgeon: Manya Silvas, MD;  Location: [facility] ENDOSCOPY;  Service: Endoscopy;  Laterality: N/A;  . COLONOSCOPY WITH PROPOFOL N/A 03/17/2019   Procedure: COLONOSCOPY WITH PROPOFOL;  Surgeon: Toledo, Benay Pike, MD;  Location: [facility] ENDOSCOPY;  Service: Gastroenterology;  Laterality: N/A;  . ELECTROPHYSIOLOGIC STUDY N/A 12/07/2015   Procedure: V Tach Ablation;  Surgeon: Will Roblero Leeds, MD;  Location: [place] CV LAB;  Service: Cardiovascular;  Laterality: N/A;  . JOINT REPLACEMENT     R TKR  . RADIOLOGY WITH ANESTHESIA N/A 05/18/2014   Procedure: RADIOLOGY WITH ANESTHESIA;  Surgeon: Rob Hickman, MD;  Location: [place];  Service: Radiology;  Laterality: N/A;  . REPLACEMENT TOTAL KNEE    . REPLACEMENT TOTAL KNEE Left   . VASECTOMY      Prior to Admission medications   Medication Sig Start Date End Date Taking? Authorizing Provider  amiodarone (PACERONE) 200 MG tablet Take 1 tablet (200 mg) by mouth once daily 03/19/19  Yes Deboraha Sprang, MD  apixaban (ELIQUIS) 5 MG TABS tablet Take 1 tablet (5 mg total) by mouth 2 (two) times daily. 03/19/19  Yes Deboraha Sprang, MD  Ascorbic Acid (VITAMIN C) 500 MG tablet Take 500 mg by mouth daily.     Yes  [provider]  atorvastatin (LIPITOR) 10 MG tablet Take 10 mg by mouth every evening.  03/07/15  Yes [provider]  Cholecalciferol (VITAMIN D3) 1000 UNITS CAPS Take 1,000 Units by mouth daily.    Yes [provider]  docusate sodium (COLACE) 50 MG capsule Take 50 mg by mouth 2 (two) times daily.   Yes [provider]  finasteride (PROSCAR) 5 MG tablet Take 1 tablet (5 mg total) by mouth daily. 01/22/19  Yes McGowan, Hunt Oris, PA-C  levothyroxine (SYNTHROID) 75 MCG tablet Take 75 mcg by mouth daily before breakfast.   Yes [provider]  mexiletine (MEXITIL) 150 MG capsule Take 1 capsule (150 mg total) by mouth 2 (two) times daily. 03/19/19  Yes Deboraha Sprang, MD  Multiple Vitamin (MULTIVITAMIN WITH MINERALS) TABS tablet Take 1 tablet by mouth daily. One A Day 50+   Yes [provider]  tamsulosin (FLOMAX) 0.4 MG CAPS capsule Take 2 capsules (0.8 mg total) by mouth daily. 01/22/19  Yes McGowan, Larene Beach A, PA-C  torsemide (DEMADEX) 10 MG tablet  Take 10 mg by mouth daily.  02/17/13  Yes [provider]    Allergies Ciprofloxacin, Levofloxacin, Enalapril maleate, Metoprolol, Vasotec [enalaprilat], and Sulfa antibiotics  Family History  Problem Relation Age of Onset  . Stroke Father   . Hypertension Father   . Heart attack Neg Hx   . Kidney disease Neg Hx   . Prostate cancer Neg Hx   . Kidney cancer Neg Hx   . Bladder Cancer Neg Hx     Social History Social History   Tobacco Use  . Smoking status: Former Smoker    Packs/day: 2.00    Years: 10.00    Pack years: 20.00    Types: Cigarettes    Start date: 02/10/1960    Quit date: 02/09/1970    Years since quitting: 49.1  . Smokeless tobacco: Never Used  . Tobacco comment: 05/2014    quit smoking  45 years ago  Substance Use Topics  . Alcohol use: Yes    Comment: 3-4 beer a week  . Drug use: No    Review of Systems Level 5 caveat:  history/ROS limited by acute/critical  illness.  Acute onset severe shortness of breath, some chest heaviness, no infectious symptoms. ____________________________________________   PHYSICAL EXAM:  VITAL SIGNS: ED Triage Vitals  Enc Vitals Group     BP 04/07/19 0234 (!) 155/106     Pulse Rate 04/07/19 0234 (!) 116     Resp 04/07/19 0234 20     Temp 04/07/19 0237 (!) 97.2 F (36.2 C)     Temp Source 04/07/19 0237 Oral     SpO2 04/07/19 0234 94 %     Weight 04/07/19 0234 80.7 kg (178 lb)     Height --      Head Circumference --      Peak Flow --      Pain Score 04/07/19 0234 5     Pain Loc --      Pain Edu? --      Excl. in [place]? --     Constitutional: Alert and oriented.  Moderate to severe respiratory distress. Eyes: Conjunctivae are normal.  Head: Atraumatic. Nose: No congestion/rhinnorhea. Mouth/Throat: Mucous membranes are moist. Neck: No stridor.  No meningeal signs.   Cardiovascular: Tachycardia, regular rhythm. Good peripheral circulation. Grossly normal heart sounds. Respiratory: Increased respiratory effort with accessory muscle usage and intercostal muscle retractions.  Mild inspiratory and expiratory wheezing throughout all lung fields.  Speaking in truncated sentences. Gastrointestinal: Soft and nontender. No distention.  Musculoskeletal: No lower extremity tenderness nor edema. No gross deformities of extremities. Neurologic:  Normal speech and language. No gross focal neurologic deficits are appreciated.  Skin:  Skin is warm, mildly diaphoretic, intact.  Pale. Psychiatric: Mood and affect are normal. Speech and behavior are normal.  ____________________________________________   LABS (all labs ordered are listed, but only abnormal results are displayed)  Labs Reviewed  BRAIN NATRIURETIC PEPTIDE - Abnormal; Notable for the following components:      Result Value   B Natriuretic Peptide 493.0 (*)    All other components within normal limits  COMPREHENSIVE METABOLIC PANEL - Abnormal; Notable for  the following components:   Glucose, Bld 125 (*)    Calcium 8.5 (*)    Total Bilirubin 1.4 (*)    All other components within normal limits  PROTIME-INR - Abnormal; Notable for the following components:   Prothrombin Time 15.3 (*)    All other components within normal limits  TROPONIN I (HIGH SENSITIVITY) -  Abnormal; Notable for the following components:   Troponin I (High Sensitivity) 36 (*)    All other components within normal limits  SARS CORONAVIRUS 2 (HOSPITAL ORDER, [place] [facility])  CBC WITH DIFFERENTIAL/PLATELET  TROPONIN I (HIGH SENSITIVITY)   ____________________________________________  EKG  ED ECG REPORT I, Hinda Kehr, the attending physician, personally viewed and interpreted this ECG.  Date: 04/07/2019 EKG Time: 2:36 AM Rate: 115 Rhythm: Sinus tachycardia with occasional PVC QRS Axis: Left axis deviation Intervals: normal ST/T Wave abnormalities: Non-specific ST segment / T-wave changes, but no clear evidence of acute ischemia. Narrative Interpretation: no definitive evidence of acute ischemia; does not meet STEMI criteria.   ____________________________________________  RADIOLOGY I, Hinda Kehr, personally viewed and evaluated these images (plain radiographs) as part of my medical decision making, as well as reviewing the written report by the radiologist.  ED MD interpretation: Developing pulmonary edema  Official radiology report(s): Dg Chest Portable 1 View  Result Date: 04/07/2019 CLINICAL DATA:  78 year old male with shortness of breath. EXAM: PORTABLE CHEST 1 VIEW COMPARISON:  Chest radiograph dated 12/01/2015 FINDINGS: There is cardiomegaly with vascular congestion and edema. Pneumonia is not excluded. Clinical correlation is recommended. No pleural effusion pneumothorax. No acute osseous pathology. IMPRESSION: Cardiomegaly with findings of congestive heart failure. Pneumonia is not excluded. Electronically Signed   By: Anner Crete M.D.   On: 04/07/2019 03:28    ____________________________________________   PROCEDURES   Procedure(s) performed (including Critical Care):  .Critical Care Performed by: Hinda Kehr, MD Authorized by: Hinda Kehr, MD   Critical care provider statement:    Critical care time (minutes):  35   Critical care time was exclusive of:  Separately billable procedures and treating other patients   Critical care was necessary to treat or prevent imminent or life-threatening deterioration of the following conditions:  Respiratory failure and cardiac failure   Critical care was time spent personally by me on the following activities:  Development of treatment plan with patient or surrogate, discussions with consultants, evaluation of patient's response to treatment, examination of patient, obtaining history from patient or surrogate, ordering and performing treatments and interventions, ordering and review of laboratory studies, ordering and review of radiographic studies, pulse oximetry, re-evaluation of patient's condition and review of old charts     ____________________________________________   INITIAL IMPRESSION / MDM / [place] / ED COURSE  As part of my medical decision making, I reviewed the following data within the electronic MEDICAL RECORD NUMBER History obtained from family, Nursing notes reviewed and incorporated, Labs reviewed , EKG interpreted , Old chart reviewed, Radiograph reviewed , Discussed with admitting physician (Dr. Marcille Blanco) and Notes from prior ED visits   Differential diagnosis includes, but is not limited to, acute heart failure with pulmonary edema, ACS, pulmonary embolism, pneumothorax.  The acute onset of the symptoms within relatively few hours after electrical cardioversion suggest a cardiac etiology is more likely than a pulmonary embolism or pneumothorax.  Additionally he is chronically on Eliquis.  He has no prior diagnosis of heart failure  but his presentation is most consistent with flash pulmonary edema.  His blood pressure is elevated and I am ordering 1 inch of Nitropaste on his chest.  Given his increased work of breathing and overall ill appearance I have ordered BiPAP which I think will help with his work of breathing as well as with the fluid that anticipate seeing on chest x-ray.  I will hold off on diuretics until the  chest x-ray continues.  Even though he had a negative COVID-19 test 5 days ago I will repeat it tonight given that he is symptomatic and will require admission.  I will have him placed on Zoll with anterior and posterior pads in case he becomes unstable or develops a dangerous arrhythmia.  Lab work pending.      Clinical Course as of Apr 06 456  Tue Apr 07, 2019  0329 Difficult to appreciate whether the elevated troponin is secondary to the cardioversion yesterday or due to the demand ischemia secondary to his shortness of breath.  He feels much better on BiPAP.  His chest x-ray is consistent with congestive heart failure which certainly correlates clinically.  I am going to give him a dose of furosemide 20 mg IV to be in the diuresis process and continue on BiPAP.  I will contact the hospitalist for admission.  COVID-19 swab is pending but it was negative just 5 days ago and he has had no known contacts.  He has no other infectious symptoms and the symptoms all started acutely earlier this evening, I think that pneumonia is very unlikely.  Troponin I (High Sensitivity)(!): 36 [CF]  [id number] Patient continues to feel better, will continue Bipap for the forseeable future.  May be able to be weaned after furosemide begins to work.  BNP elevated at 493 with no prior CHF history.    Paged hospitalist for admission.   [CF]  0413 Discussed case by phone with Dr. Marcille Blanco who will admit   [CF]  0413 SARS Coronavirus 2: NEGATIVE [CF]    Clinical Course User Index [CF] Hinda Kehr, MD      ____________________________________________  FINAL CLINICAL IMPRESSION(S) / ED DIAGNOSES  Final diagnoses:  Acute heart failure, unspecified heart failure type ([place])  Acute pulmonary edema ([place])  Acute respiratory failure with hypoxemia ([place])     MEDICATIONS GIVEN DURING THIS VISIT:  Medications  nitroGLYCERIN (NITROGLYN) 2 % ointment 1 inch (1 inch Topical Given 04/07/19 0319)  furosemide (LASIX) injection 40 mg (40 mg Intravenous Given 04/07/19 0421)     ED Discharge Orders    None      *Please note:  Austin Warren was evaluated in Emergency Department on 04/07/2019 for the symptoms described in the history of present illness. He was evaluated in the context of the global COVID-19 pandemic, which necessitated consideration that the patient might be at risk for infection with the SARS-CoV-2 virus that causes COVID-19. Institutional protocols and algorithms that pertain to the evaluation of patients at risk for COVID-19 are in a state of rapid change based on information released by regulatory bodies including the CDC and federal and state organizations. These policies and algorithms were followed during the patient's care in the ED.  Some ED evaluations and interventions may be delayed as a result of limited staffing during the pandemic.*  Note:  This document was prepared using Dragon voice recognition software and may include unintentional dictation errors.   Hinda Kehr, MD 04/07/19 [phone]

## 2019-04-07 NOTE — ED Notes (Signed)
Nitro paste removed from left chest at this time per verbal order from cardiology MD.

## 2019-04-07 NOTE — Progress Notes (Signed)
Pt was seen and examined in ER in am and on the floor.  Shortness of breath significantly improved.  Denies any chest pain.  Seen by cardiology.  Wife at bedside.  Off BiPAP , will continue current plan of care

## 2019-04-07 NOTE — ED Notes (Signed)
With verbal ok from [facility] MD patient taken off of BiPAP at this time. Patient placed on 3L [place]. Patient reports improvement in breathing. Even and non labored respirations noted. Wife remains at bedside. Will continue to monitor. Call light within reach.

## 2019-04-07 NOTE — Consult Note (Signed)
Cardiology Consultation:   Patient ID: CODA CHOTO MRN: [id number]; DOB: Sep 23, 1940  Admit date: 04/07/2019 Date of Consult: 04/07/2019  Primary Care Provider: Kirk Ruths, MD Primary Cardiologist: Dr. Caryl Comes Primary Electrophysiologist: Dr. Caryl Comes   Patient Profile:   Austin Warren is a 78 y.o. male with a hx of persistent atrial fibrillation s/p most recent DCCV 04/06/2019, VT s/p 11/2015 ablation, RBBB, NICM, HFrEF, hypertension, hyperlipidemia, history of stroke 2015, hypothyroidism, and who is being seen today for the evaluation of shortness of breath at the request of Dr. Marcille Blanco.  History of Present Illness:   Austin Warren is an 78 year old male with PMH as above.  He is followed by Dr. Caryl Comes for ventricular tachycardia and paroxysmal atrial fibrillation. Of note, he had a stroke in 05/2014 and found to be Afib with TPA treatment and started on apixaban at that time.   He has been followed by EP for ventricular tachycardia with a right bundle superior axis and narrow QRS suggesting septal origin and possible verapamil sensitivity. Per review of notes, he has history of past catheterization had shown normal cors; however, this cath report is not available to me at this time. In addition to verapamil, therefore, he was started on mexiletine. Low voltage EKG was noted. He underwent MR 4/16 that showed an inferolateral late gadolinium enhancement slightly more pronounced and small focal late gadolinium enhancement at the point of inferior RV attachment to the LV wall.  He underwent fat pad biopsy that was negative.  In 2017, he was admitted with sustained ventricular tachycardia and transferred to [facility] in [place]. Cardiac MRI showed EF 43% with inferolateral and apical hypokinesis and inferolateral basilar scar and thought not worse when compared with prior MR from 4/16.  12/02/15 echo showed EF 40-45% and akinesis of the basal-mid inferolateral myocardium, LVH, and PASP 76mm  HG. Repeat echo 12/05/2015 showed LVEF 40 to 45% with hypokinesis worse at the apex, mild AR/MR, mild LAE, PASP 52 mmHg.  On 12/07/15, he presented for V tach radioablation and was in NSR. 3D mapping of the left ventricle showed no evidence of endocardial scar. During the procedure, ventricular tachycardia could not be induced but ventricular flutter was induced at a rate of 220 ms. During this time, the patient became unstable and lost consciousness, requiring urgent cardioversion.  There were no reported complications. ICD implantation was discussed; however, he had elected to defer that decision.  He was discharged on increased mexiletine.  He was seen again 6/17 for recurrent ventricular tachycardia with EKG showing right bundle superior axis at cycle length of just under 300 ms. on 617, he was started on amiodarone.  Due to bradycardia, he required down titration of amiodarone.  He underwent DCCV for Afib 03/07/2018. However, over the summer of 2020, the he noted faster rates in the 90s to 110s, At follow-up in the office, he noted symptoms that included he felt he was reluctant to exercise, and he also noted some LEE.  It was suspected he had been back in atrial fibrillation for approximately 3 months with modest symptoms.  Amiodarone was increased from 200 mg 3 times daily to 200 mg 7 times per week.  He was scheduled for cardioversion 04/06/2019, due to needing to restart his [place] (which was held for a colonoscopy that same week).  It was noted that, following cardioversion, it was anticipated amiodarone would be reduced to 5 times per week at follow-up in approximately 6 weeks after cardioversion. For LEE, he was  continued on his torsemide with notes stating this would also be reevaluated with restoration of NSR.   He underwent successful cardioversion 04/06/2019. That same day, he ate a dinner that included 3 beers and chili.  He also noted that, prior to his cardioversion, he had held his morning dose of  torsemide.  The only other reported medication change was that his PCP had recently changed his Synthroid dose, due to a bump in his TSH.  He did not take the torsemide after the cardioversion. He went to bed on 9/21 at approximately 10 PM.  At around 11PM/12AM, he woke up feeling slightly short of breath but went back to sleep.  At approximately 2 AM, he woke up significantly short of breath with his wife's recommendation that he present to the [facility] ED. He was not certain of this SOB improved with sitting up / was worse with laying down (uncertain if orthopnea).  He reported chronic lower extremity edema, as reported in previous clinic visits.  In the ED, he was noted to be using accessory muscles and had difficulty finishing his sentences. BP was 155/106 and HR was 116bpm. BNP 493.0.  Other labs included potassium 4.2, creatinine 0.98, BUN 18, troponin 36  41, hemoglobin 16.5.  Chest x-ray showed findings of CHF.  EKG showed SR, 83bpm, RBBB.  He was started on BiPAP and IV Lasix with subsequent improvement in shortness of breath. Zoll was placed in the ED.   Heart Pathway Score:     Past Medical History:  Diagnosis Date  . Arthritis   . Benign prostatic hypertrophy   . Bursitis of left shoulder   . CHF (congestive heart failure) ([place])   . Diabetes ([place])   . Dyspnea on exertion 11/01/2011  . Elevated PSA   . History of colon polyps   . History of colon polyps   . Hyperlipidemia   . Hypertension   . Melanoma ([place])    under arm  . Microscopic hematuria   . Osteoarthritis   . Retroperitoneal mass   . Stroke ([place]) 05/2014  . TIA (transient ischemic attack)   . Urinary retention   . UTI (urinary tract infection)   . Ventricular tachycardia ([facility])    RBB/LAHB ideopathic VT, 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    Past Surgical History:  Procedure Laterality Date  . CARDIOVERSION    . CARDIOVERSION N/A 03/07/2018   Procedure: CARDIOVERSION;  Surgeon: Wellington Hampshire, MD;  Location: [facility] ORS;   Service: Cardiovascular;  Laterality: N/A;  . COLONOSCOPY WITH PROPOFOL N/A 10/17/2015   Procedure: COLONOSCOPY WITH PROPOFOL;  Surgeon: Manya Silvas, MD;  Location: [facility] ENDOSCOPY;  Service: Endoscopy;  Laterality: N/A;  . COLONOSCOPY WITH PROPOFOL N/A 03/17/2019   Procedure: COLONOSCOPY WITH PROPOFOL;  Surgeon: Toledo, Benay Pike, MD;  Location: [facility] ENDOSCOPY;  Service: Gastroenterology;  Laterality: N/A;  . ELECTROPHYSIOLOGIC STUDY N/A 12/07/2015   Procedure: V Tach Ablation;  Surgeon: Will Mckamey Leeds, MD;  Location: [place] CV LAB;  Service: Cardiovascular;  Laterality: N/A;  . JOINT REPLACEMENT     R TKR  . RADIOLOGY WITH ANESTHESIA N/A 05/18/2014   Procedure: RADIOLOGY WITH ANESTHESIA;  Surgeon: Rob Hickman, MD;  Location: [place];  Service: Radiology;  Laterality: N/A;  . REPLACEMENT TOTAL KNEE    . REPLACEMENT TOTAL KNEE Left   . VASECTOMY       Home Medications:  Prior to Admission medications   Medication Sig Start Date End Date Taking? Authorizing Provider  amiodarone (  PACERONE) 200 MG tablet Take 1 tablet (200 mg) by mouth once daily 03/19/19  Yes Deboraha Sprang, MD  apixaban (ELIQUIS) 5 MG TABS tablet Take 1 tablet (5 mg total) by mouth 2 (two) times daily. 03/19/19  Yes Deboraha Sprang, MD  Ascorbic Acid (VITAMIN C) 500 MG tablet Take 500 mg by mouth daily.     Yes [provider]  atorvastatin (LIPITOR) 10 MG tablet Take 10 mg by mouth every evening.  03/07/15  Yes [provider]  Cholecalciferol (VITAMIN D3) 1000 UNITS CAPS Take 1,000 Units by mouth daily.    Yes [provider]  docusate sodium (COLACE) 50 MG capsule Take 50 mg by mouth 2 (two) times daily.   Yes [provider]  finasteride (PROSCAR) 5 MG tablet Take 1 tablet (5 mg total) by mouth daily. 01/22/19  Yes McGowan, Hunt Oris, PA-C  levothyroxine (SYNTHROID) 75 MCG tablet Take 75 mcg by mouth daily before breakfast.   Yes [provider]  mexiletine (MEXITIL)  150 MG capsule Take 1 capsule (150 mg total) by mouth 2 (two) times daily. 03/19/19  Yes Deboraha Sprang, MD  Multiple Vitamin (MULTIVITAMIN WITH MINERALS) TABS tablet Take 1 tablet by mouth daily. One A Day 50+   Yes [provider]  tamsulosin (FLOMAX) 0.4 MG CAPS capsule Take 2 capsules (0.8 mg total) by mouth daily. 01/22/19  Yes McGowan, Larene Beach A, PA-C  torsemide (DEMADEX) 10 MG tablet Take 10 mg by mouth daily.  02/17/13  Yes [provider]    Inpatient Medications: Scheduled Meds: . amiodarone  200 mg Oral Daily  . apixaban  5 mg Oral BID  . atorvastatin  10 mg Oral QPM  . cholecalciferol  1,000 Units Oral Daily  . docusate sodium  100 mg Oral BID  . finasteride  5 mg Oral Daily  . furosemide  40 mg Intravenous Q6H  . levothyroxine  75 mcg Oral QAC breakfast  . mexiletine  150 mg Oral BID  . tamsulosin  0.8 mg Oral Daily  . [START ON 04/08/2019] torsemide  10 mg Oral Daily   Continuous Infusions:  PRN Meds: acetaminophen **OR** acetaminophen, ondansetron **OR** ondansetron (ZOFRAN) IV  Allergies:    Allergies  Allergen Reactions  . Ciprofloxacin Anaphylaxis  . Levofloxacin Anaphylaxis    "throat closes"  . Enalapril Maleate Swelling    Angioedema  . Metoprolol Swelling    edema  . Vasotec [Enalaprilat] Other (See Comments)    Reaction: Angioedema  . Sulfa Antibiotics Rash and Other (See Comments)    Hypotension    Social History:   Social History   Socioeconomic History  . Marital status: Married    Spouse name: Not on file  . Number of children: 2  . Years of education: BS  . Highest education level: Not on file  Occupational History  . Not on file  Social Needs  . Financial resource strain: Not on file  . Food insecurity    Worry: Not on file    Inability: Not on file  . Transportation needs    Medical: Not on file    Non-medical: Not on file  Tobacco Use  . Smoking status: Former Smoker    Packs/day: 2.00    Years: 10.00    Pack  years: 20.00    Types: Cigarettes    Start date: 02/10/1960    Quit date: 02/09/1970    Years since quitting: 49.1  . Smokeless tobacco: Never Used  .  Tobacco comment: 05/2014    quit smoking  45 years ago  Substance and Sexual Activity  . Alcohol use: Yes    Comment: 3-4 beer a week  . Drug use: No  . Sexual activity: Not on file  Lifestyle  . Physical activity    Days per week: Not on file    Minutes per session: Not on file  . Stress: Not on file  Relationships  . Social Herbalist on phone: Not on file    Gets together: Not on file    Attends religious service: Not on file    Active member of club or organization: Not on file    Attends meetings of clubs or organizations: Not on file    Relationship status: Not on file  . Intimate partner violence    Fear of current or ex partner: Not on file    Emotionally abused: Not on file    Physically abused: Not on file    Forced sexual activity: Not on file  Other Topics Concern  . Not on file  Social History Narrative   Patient is married with 1 living and 1 deceased child.   Patient is right handed.   Patient has BS degree.   Patient 1 cup daily.    Family History:    Family History  Problem Relation Age of Onset  . Stroke Father   . Hypertension Father   . Heart attack Neg Hx   . Kidney disease Neg Hx   . Prostate cancer Neg Hx   . Kidney cancer Neg Hx   . Bladder Cancer Neg Hx      ROS:  Please see the history of present illness.  Review of Systems  Respiratory: Positive for shortness of breath.   Cardiovascular: Positive for leg swelling. Negative for chest pain and palpitations.       Chronic  LEE, uncertain if orthopneic   All other systems reviewed and are negative.   All other ROS reviewed and negative.     Physical Exam/Data:   Vitals:   04/07/19 1200 04/07/19 1230 04/07/19 1300 04/07/19 1330  BP: 110/69 107/72 117/70 123/77  Pulse: 82 84 84 89  Resp: 19 20 (!) 21 19  Temp:       TempSrc:      SpO2: 93% 93% 96% 95%  Weight:        Intake/Output Summary (Last 24 hours) at 04/07/2019 1345 Last data filed at 04/07/2019 1251 Gross per 24 hour  Intake -  Output 1100 ml  Net -1100 ml   Last 3 Weights 04/07/2019 04/06/2019 03/19/2019  Weight (lbs) 178 lb 177 lb 178 lb  Weight (kg) 80.74 kg 80.287 kg 80.74 kg     Body mass index is 28.73 kg/m.  General:  Well nourished, well developed, in no acute distress. Accompanied by wife.  HEENT: normal Neck: no JVD, JVP ~9cm Vascular: No carotid bruits; radial pulses 2+ bilaterally Cardiac:  normal S1, S2; tachycardic but regular; 1/6 systolic murmur Lungs: coarse breath sounds bilaterally but no wheezing, rhonchi or rales  Abd: soft, nontender, no hepatomegaly  Ext: mild / non-pitting bilateral lower extremity  edema Musculoskeletal:  No deformities, BUE and BLE strength normal and equal Skin: warm and dry  Neuro:  No focal abnormalities noted Psych:  Normal affect   EKG:  The EKG was personally reviewed and demonstrates:  SR, 83bpm, RBBB Telemetry:  Telemetry was personally reviewed and demonstrates: ST  Relevant CV  Studies: Echo 12/05/2015 Study Conclusions - Left ventricle: The cavity size was mildly dilated. Systolic   function was mildly to moderately reduced. The estimated ejection   fraction was in the range of 40% to 45% with hypokinesis worse at   the apex. - Aortic valve: There was mild regurgitation. - Mitral valve: There was mild regurgitation. - Left atrium: The atrium was mildly dilated. - Pulmonary arteries: PA peak pressure: 52 mm Hg (S).  Laboratory Data:  High Sensitivity Troponin:   Recent Labs  Lab 04/07/19 0248 04/07/19 0505  TROPONINIHS 36* 41*     Cardiac EnzymesNo results for input(s): TROPONINI in the last 168 hours. No results for input(s): TROPIPOC in the last 168 hours.  Chemistry Recent Labs  Lab 04/02/19 1203 04/07/19 0248  NA 138 137  K 4.2 4.2  CL 101 104  CO2 26 23   GLUCOSE 112* 125*  BUN 17 18  CREATININE 0.96 0.98  CALCIUM 9.0 8.5*  GFRNONAA >60 >60  GFRAA >60 >60  ANIONGAP 11 10    Recent Labs  Lab 04/02/19 1203 04/07/19 0248  PROT 7.4 7.2  ALBUMIN 4.3 4.2  AST 24 28  ALT 24 23  ALKPHOS 121 111  BILITOT 1.8* 1.4*   Hematology Recent Labs  Lab 04/02/19 1203 04/07/19 0248  WBC 7.2 7.4  RBC 4.84 5.01  HGB 15.8 16.5  HCT 47.4 49.5  MCV 97.9 98.8  MCH 32.6 32.9  MCHC 33.3 33.3  RDW 13.2 13.2  PLT 149* 160   BNP Recent Labs  Lab 04/07/19 0248  BNP 493.0*    DDimer No results for input(s): DDIMER in the last 168 hours.   Radiology/Studies:  Dg Chest Portable 1 View  Result Date: 04/07/2019 CLINICAL DATA:  78 year old male with shortness of breath. EXAM: PORTABLE CHEST 1 VIEW COMPARISON:  Chest radiograph dated 12/01/2015 FINDINGS: There is cardiomegaly with vascular congestion and edema. Pneumonia is not excluded. Clinical correlation is recommended. No pleural effusion pneumothorax. No acute osseous pathology. IMPRESSION: Cardiomegaly with findings of congestive heart failure. Pneumonia is not excluded. Electronically Signed   By: Anner Crete M.D.   On: 04/07/2019 03:28    Assessment and Plan:   Acute on chronic HFrEF / NICM --Acute SOB, waking him from sleep on 9/21. While this SOB occurred on the same day as his cardioversion, suspicion is that his SOB is most likely 2/2 volume overload in the setting of missed dose of torsemide and 3 beers/chili eaten same day rather than cardiac stunning due to cardioversion. CXR shows findings suggestive of congestion. Patient improved with IV lasix and BiPAP. Unfortunately, we do not have an ED weight to compare to his last office visit (weight was copied over from his last office visit). He is -1L since presentation in the ED. He remains in SR as below. --While the patient was still in the emergency department, it was recommended the RN remove his nitro patch and he ambulate with  reassessment of vital signs and symptoms.  If able to successfully ambulate without significant symptoms of CP or SOB, and stable vital signs, he was okay to discharge home with recommendation he obtain an updated echocardiogram as an outpatient and no current medication changes.  --Since consultation, however patient appears to have been admitted to the hospital. Recommend obtain echo. Daily BMET. Continue to monitor I/O, daily weights. As patient is not terribly volume overloaded on exam, can likely transition back to oral diuresis tomorrow with PTA torsemide. Will need to  touch base with internal medicine regarding plan for admission / ED regarding results of attempt at ambulation before admission.  Elevated HS Tn --No chest pain.  High-sensitivity troponin minimally elevated and more consistent with that of supply demand ischemia.  EKG without acute changes.  No plan for cardiac catheterization this admission at this time.  Updated echo recommended as above.  Persistent atrial fibrillation s/p DCCV 04/06/2019 --Remains in sinus rhythm on telemetry after yesterday's cardioversion.  Rates have been in the 80s to 90s.  EKG as above shows sinus rhythm and known right bundle branch block.  Continue amiodarone.  Continue Eliquis 5 mg twice daily.  H/o ventricular tachycardia s/p VT ablation --Remains in sinus rhythm as above.  Continue medical management with mexiletine 150 mg twice daily and amiodarone.  HTN --Controlled. Continue medical management.  HLD --Continue statin.    For questions or updates, please contact [place] Please consult [email or web address] for contact info under     Signed, Arvil Chaco, PA-C  04/07/2019 1:45 PM

## 2019-04-07 NOTE — ED Notes (Signed)
Attempted to call report x2

## 2019-04-08 LAB — HEMOGLOBIN A1C
Hgb A1c MFr Bld: 5.2 % (ref 4.8–5.6)
Mean Plasma Glucose: 103 mg/dL

## 2019-04-08 MED ORDER — LEVOTHYROXINE SODIUM 100 MCG PO TABS: 100.0000 ug | ORAL_TABLET | Freq: Every day | ORAL | Status: DC

## 2019-04-08 MED ORDER — LEVOTHYROXINE SODIUM 100 MCG PO TABS: 100.0000 ug | ORAL_TABLET | Freq: Every day | ORAL | 0 refills | Status: DC

## 2019-04-08 NOTE — Plan of Care (Signed)
  Problem: Activity: Goal: Risk for activity intolerance will decrease Outcome: Progressing   Problem: Pain Managment: Goal: General experience of comfort will improve Outcome: Progressing   Problem: Safety: Goal: Ability to remain free from injury will improve Outcome: Progressing   Problem: Education: Goal: Knowledge of General Education information will improve Description: Including pain rating scale, medication(s)/side effects and non-pharmacologic comfort measures Outcome: Completed/Met   Problem: Clinical Measurements: Goal: Respiratory complications will improve Outcome: Completed/Met   Problem: Nutrition: Goal: Adequate nutrition will be maintained Outcome: Completed/Met   Problem: Coping: Goal: Level of anxiety will decrease Outcome: Completed/Met

## 2019-04-08 NOTE — Progress Notes (Signed)
Cardiovascular and Pulmonary Nurse Navigator Note:   78 year old male with hx of  Chronic systolic CHF with last EF of 40 to 45%, HTN, Hypothyroid, HLD, BPH, Atrial Fibrillation - s/p cardioversion yesterday.  Patient presented to the ER  with acute shortness of breath following cardioversion yesterday.  He has a history of nonischemic cardiomyopathy with moderately reduced LVEF.  Patient admitted with acute on chronic systolic heart failure.  Diuretics given.  Cardiologist suspects that his pulmonary edema was due to significant sodium and fluid intake and not taking his torsemide yesterday.    CHF EDUCATION:   Rounded on patient.  Patient dressed and walking around room. Patient ready for discharge.  Wife at bedside.  Patient gave verbal permission for this RN to speak in front of patient.   Provided patient with "Living Better with Heart Failure" packet. Briefly reviewed definition of heart failure and signs and symptoms of an exacerbation.?Explained to patient that HF is a chronic illness which requires self-assessment / self-management along with help from the cardiologist/PCP.    *Reviewed importance of and reason behind checking weight daily in the AM, after using the bathroom, but before getting dressed. Patient has functioning scale and plans to start weighing himself daily.  *Reviewed with patient the following information: *Discussed when to call the Dr= weight gain of >2-3lb overnight of 5lb in a week, *Discussed yellow zone= call MD: weight gain of >2-3lb overnight of 5lb in a week, increased swelling, increased SOB when lying down, chest discomfort, dizziness, increased fatigue *Red Zone= call 911: struggle to breath, fainting or near fainting, significant chest pain  *Heart Failure Zone magnet given and reviewed with patient.  *Diet - Reviewed low sodium diet-provided handout of recommended and not recommended foods.   *Discussed fluid intake with patient as well. Patient not  currently on a fluid restriction, but advised no more than 64 ounces of fluid per day.?Patient informed this RN that he drinks a lot of water and two to three beers at lunch at times.  Patient's wife spoke up and said, "Well you will have to include that in your 64 ounces of fluid per day."    *Instructed patient to take medications as prescribed for heart failure. Explained briefly why pt is on the medications (either make you feel better, live longer or keep you out of the hospital) and discussed monitoring and side effects.  Patient to resume taking his home dose of torsemide and all of his other chronic home medications.   *Discussed exercise.  Dr. Margaretmary Eddy mentioned Cardiac Rehab for this patient.  Patient does not qualify under the dx of CHF with an EF of 40-45%.  Patient would qualify for Pulmonary Rehab.  Spoke with patient and wife about the Pulmonary Rehab program.  Brochure provided.  They plan to discuss this with Dr. Caryl Comes in one to two weeks.   *Smoking Cessation- Patient is a former smoker.    Patient to follow-up with [facility] [place] in one to two weeks.   Patient and wife refused appointment in the [facility].    Again, the 5 Steps to Living Better with Heart Failure were reviewed with patient.  Patient / wife thanked me for providing the above information. ?  Roanna Epley, RN, BSN, [place]  [facility] Cardiac & Pulmonary Rehab  Cardiovascular & Pulmonary Nurse Navigator  Direct Line: [phone]  Department Phone #: [phone] Fax: [phone]  Email Address: [email or web address]

## 2019-04-08 NOTE — Discharge Instructions (Signed)
Follow-up with primary care physician in 2 to 3 days Follow-up with primary cardiologist at [facility] in a week Outpatient cardiopulmonary rehabilitation follow-up Daily weights and intake and output monitoring

## 2019-04-08 NOTE — Progress Notes (Signed)
Patient discharged to home with family.  Tele and IV prior to discharge. Patient verbalizes understanding of discharge instructions.

## 2019-04-08 NOTE — Discharge Summary (Signed)
[facility] [place] [facility] NAME: Austin Warren    MR#:  [id number]  DATE OF BIRTH:  [birth year]  DATE OF ADMISSION:  04/07/2019 ADMITTING PHYSICIAN: Harrie Foreman, MD  DATE OF DISCHARGE:  04/08/19   PRIMARY CARE PHYSICIAN: Kirk Ruths, MD    ADMISSION DIAGNOSIS:  Acute pulmonary edema (HCC) [J81.0] Acute respiratory failure with hypoxemia (HCC) [J96.01] Acute heart failure, unspecified heart failure type ([place]) [I50.9] Acute on chronic systolic CHF (congestive heart failure) ([place]) [I50.23]  DISCHARGE DIAGNOSIS:  Active Problems:   Acute on chronic systolic CHF (congestive heart failure) ([place])   SECONDARY DIAGNOSIS:   Past Medical History:  Diagnosis Date  . Arthritis   . Benign prostatic hypertrophy   . Bursitis of left shoulder   . CHF (congestive heart failure) ([place])   . Diabetes ([place])   . Dyspnea on exertion 11/01/2011  . Elevated PSA   . History of colon polyps   . History of colon polyps   . Hyperlipidemia   . Hypertension   . Melanoma ([place])    under arm  . Microscopic hematuria   . Osteoarthritis   . Retroperitoneal mass   . Stroke ([place]) 05/2014  . TIA (transient ischemic attack)   . Urinary retention   . UTI (urinary tract infection)   . Ventricular tachycardia (HCC)    RBB/LAHB ideopathic VT, 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    HOSPITAL COURSE:   HPI: The patient with past medical history of CHF, hyperlipidemia, hypertension and diet-controlled diabetes presents to the emergency department complaining of shortness of breath.  The patient underwent planned cardioversion yesterday for atrial fibrillation with rapid ventricular rate.  He felt well following the procedure but awoke this evening with shortness of breath.  The patient denies chest pain, nausea, vomiting or diaphoresis.  Chest x-ray showed pulmonary edema as well as cardiomegaly.  Telemetry within the emergency department showed sinus  tachycardia.  The patient was placed on BiPAP prior to the emergency department staff called the hospitalist service for admission.  1.  CHF: Acute on chronic; systolic.  Last EF 40 to 45%. -Volume overloaded after missing 1 dose of torsemide and having 3 beers with chili  The patient is received Lasix and  off  BiPAP,  work up of  breathing is significantly improved.  Weaned oxygen as tolerated, patient is on room air now  Seen by cardiology Dr. end okay to discharge patient from his standpoint outpatient follow-up is recommended Monitor daily weights, intake and output Outpatient pulmonary rehabilitation  2.  Atrial fibrillation: Status post cardioversion 04/06/2019; currently sinus tachycardia.  Continue amiodarone and mexiletine.  Eliquis for anticoagulation . 3.  Hypertension: Controlled; continue to monitor  4.  Hypotension: elevated TSH; continue Synthroid, dose increased  To 100 mcg  5.  Hyperlipidemia: Continue statin therapy 6.  BPH: Continue tamsulosin 7.  DVT prophylaxis: As above 8.  GI prophylaxis: None  We will discharge patient home DISCHARGE CONDITIONS:   Stable   CONSULTS OBTAINED:  Treatment Team:  Nelva Bush, MD   PROCEDURES  None   DRUG ALLERGIES:   Allergies  Allergen Reactions  . Ciprofloxacin Anaphylaxis  . Levofloxacin Anaphylaxis    "throat closes"  . Enalapril Maleate Swelling    Angioedema  . Metoprolol Swelling    edema  . Vasotec [Enalaprilat] Other (See Comments)    Reaction: Angioedema  . Sulfa Antibiotics Rash and Other (See Comments)    Hypotension  DISCHARGE MEDICATIONS:   Allergies as of 04/08/2019      Reactions   Ciprofloxacin Anaphylaxis   Levofloxacin Anaphylaxis   "throat closes"   Enalapril Maleate Swelling   Angioedema   Metoprolol Swelling   edema   Vasotec [enalaprilat] Other (See Comments)   Reaction: Angioedema   Sulfa Antibiotics Rash, Other (See Comments)   Hypotension      Medication List     TAKE these medications   amiodarone 200 MG tablet Commonly known as: PACERONE Take 1 tablet (200 mg) by mouth once daily   apixaban 5 MG Tabs tablet Commonly known as: Eliquis Take 1 tablet (5 mg total) by mouth 2 (two) times daily.   atorvastatin 10 MG tablet Commonly known as: LIPITOR Take 10 mg by mouth every evening.   docusate sodium 50 MG capsule Commonly known as: COLACE Take 50 mg by mouth 2 (two) times daily.   finasteride 5 MG tablet Commonly known as: PROSCAR Take 1 tablet (5 mg total) by mouth daily.   levothyroxine 100 MCG tablet Commonly known as: SYNTHROID Take 1 tablet (100 mcg total) by mouth daily before breakfast. Start taking on: April 09, 2019 What changed:   medication strength  how much to take   mexiletine 150 MG capsule Commonly known as: MEXITIL Take 1 capsule (150 mg total) by mouth 2 (two) times daily.   multivitamin with minerals Tabs tablet Take 1 tablet by mouth daily. One A Day 50+   tamsulosin 0.4 MG Caps capsule Commonly known as: FLOMAX Take 2 capsules (0.8 mg total) by mouth daily.   torsemide 10 MG tablet Commonly known as: DEMADEX Take 10 mg by mouth daily.   vitamin C 500 MG tablet Commonly known as: ASCORBIC ACID Take 500 mg by mouth daily.   Vitamin D3 25 MCG (1000 UT) Caps Take 1,000 Units by mouth daily.        DISCHARGE INSTRUCTIONS:   Follow-up with primary care physician in 2 to 3 days Follow-up with primary cardiologist at [facility] in a week Outpatient cardiopulmonary rehabilitation follow-up Daily weights and intake and output monitoring  DIET:  Cardiac diet  DISCHARGE CONDITION:  Fair  ACTIVITY:  Activity as tolerated  OXYGEN:  Home Oxygen: No.   Oxygen Delivery: room air  DISCHARGE LOCATION:  home   If you experience worsening of your admission symptoms, develop shortness of breath, life threatening emergency, suicidal or homicidal thoughts you must seek medical  attention immediately by calling 911 or calling your MD immediately  if symptoms less severe.  You Must read complete instructions/literature along with all the possible adverse reactions/side effects for all the Medicines you take and that have been prescribed to you. Take any new Medicines after you have completely understood and accpet all the possible adverse reactions/side effects.   Please note  You were cared for by a hospitalist during your hospital stay. If you have any questions about your discharge medications or the care you received while you were in the hospital after you are discharged, you can call the unit and asked to speak with the hospitalist on call if the hospitalist that took care of you is not available. Once you are discharged, your primary care physician will handle any further medical issues. Please note that NO REFILLS for any discharge medications will be authorized once you are discharged, as it is imperative that you return to your primary care physician (or establish a relationship with a primary care physician if  you do not have one) for your aftercare needs so that they can reassess your need for medications and monitor your lab values.     Today  Chief Complaint  Patient presents with  . Shortness of Breath  Patient is feeling fine without any chest pain or shortness of breath.  Ambulating okay no overnight reports we will discharge him home  outpatient echocardiogram   ROS:  CONSTITUTIONAL: Denies fevers, chills. Denies any fatigue, weakness.  EYES: Denies blurry vision, double vision, eye pain. EARS, NOSE, THROAT: Denies tinnitus, ear pain, hearing loss. RESPIRATORY: Denies cough, wheeze, shortness of breath.  CARDIOVASCULAR: Denies chest pain, palpitations, edema.  GASTROINTESTINAL: Denies nausea, vomiting, diarrhea, abdominal pain. Denies bright red blood per rectum. GENITOURINARY: Denies dysuria, hematuria. ENDOCRINE: Denies nocturia or thyroid  problems. HEMATOLOGIC AND LYMPHATIC: Denies easy bruising or bleeding. SKIN: Denies rash or lesion. MUSCULOSKELETAL: Denies pain in neck, back, shoulder, knees, hips or arthritic symptoms.  NEUROLOGIC: Denies paralysis, paresthesias.  PSYCHIATRIC: Denies anxiety or depressive symptoms.   VITAL SIGNS:  Blood pressure 115/81, pulse 85, temperature 97.7 F (36.5 C), temperature source Oral, resp. rate 18, height 5\' 6"  (1.676 m), weight 78.7 kg, SpO2 99 %.  I/O:    Intake/Output Summary (Last 24 hours) at 04/08/2019 1025 Last data filed at 04/08/2019 0400 Gross per 24 hour  Intake 240 ml  Output 2550 ml  Net -2310 ml    PHYSICAL EXAMINATION:  GENERAL:  78 y.o.-year-old patient lying in the bed with no acute distress.  EYES: Pupils equal, round, reactive to light and accommodation. No scleral icterus. Extraocular muscles intact.  HEENT: Head atraumatic, normocephalic. Oropharynx and nasopharynx clear.  NECK:  Supple, no jugular venous distention. No thyroid enlargement, no tenderness.  LUNGS: Normal breath sounds bilaterally, no wheezing, rales,rhonchi or crepitation. No use of accessory muscles of respiration.  CARDIOVASCULAR: S1, S2 normal. No murmurs, rubs, or gallops.  ABDOMEN: Soft, non-tender, non-distended. Bowel sounds present.  EXTREMITIES: No pedal edema, cyanosis, or clubbing.  NEUROLOGIC: Cranial nerves II through XII are intact. Muscle strength 5/5 in all extremities. Sensation intact. Gait not checked.  PSYCHIATRIC: The patient is alert and oriented x 3.  SKIN: No obvious rash, lesion, or ulcer.   DATA REVIEW:   CBC Recent Labs  Lab 04/07/19 0248  WBC 7.4  HGB 16.5  HCT 49.5  PLT 160    Chemistries  Recent Labs  Lab 04/07/19 0248 04/07/19 1931  NA 137 137  K 4.2 3.8  CL 104 98  CO2 23 26  GLUCOSE 125* 117*  BUN 18 19  CREATININE 0.98 0.92  CALCIUM 8.5* 8.7*  MG  --  2.0  AST 28  --   ALT 23  --   ALKPHOS 111  --   BILITOT 1.4*  --     Cardiac  Enzymes No results for input(s): TROPONINI in the last 168 hours.  Microbiology Results  Results for orders placed or performed during the hospital encounter of 04/07/19  SARS Coronavirus 2 [facility] order, Performed in [facility] hospital lab) Nasopharyngeal Nasopharyngeal Swab     Status: None   Collection Time: 04/07/19  2:48 AM   Specimen: Nasopharyngeal Swab  Result Value Ref Range Status   SARS Coronavirus 2 NEGATIVE NEGATIVE Final    Comment: (NOTE) If result is NEGATIVE SARS-CoV-2 target nucleic acids are NOT DETECTED. The SARS-CoV-2 RNA is generally detectable in upper and lower  respiratory specimens during the acute phase of infection. The lowest  concentration of SARS-CoV-2  viral copies this assay can detect is 250  copies / mL. A negative result does not preclude SARS-CoV-2 infection  and should not be used as the sole basis for treatment or other  patient management decisions.  A negative result may occur with  improper specimen collection / handling, submission of specimen other  than nasopharyngeal swab, presence of viral mutation(s) within the  areas targeted by this assay, and inadequate number of viral copies  (<250 copies / mL). A negative result must be combined with clinical  observations, patient history, and epidemiological information. If result is POSITIVE SARS-CoV-2 target nucleic acids are DETECTED. The SARS-CoV-2 RNA is generally detectable in upper and lower  respiratory specimens dur ing the acute phase of infection.  Positive  results are indicative of active infection with SARS-CoV-2.  Clinical  correlation with patient history and other diagnostic information is  necessary to determine patient infection status.  Positive results do  not rule out bacterial infection or co-infection with other viruses. If result is PRESUMPTIVE POSTIVE SARS-CoV-2 nucleic acids MAY BE PRESENT.   A presumptive positive result was obtained on the submitted specimen   and confirmed on repeat testing.  While 2019 novel coronavirus  (SARS-CoV-2) nucleic acids may be present in the submitted sample  additional confirmatory testing may be necessary for epidemiological  and / or clinical management purposes  to differentiate between  SARS-CoV-2 and other Sarbecovirus currently known to infect humans.  If clinically indicated additional testing with an alternate test  methodology [phone]) is advised. The SARS-CoV-2 RNA is generally  detectable in upper and lower respiratory sp ecimens during the acute  phase of infection. The expected result is Negative. Fact Sheet for Patients:  StrictlyIdeas.no Fact Sheet for Healthcare Providers: BankingDealers.co.za This test is not yet approved or cleared by the [place] FDA and has been authorized for detection and/or diagnosis of SARS-CoV-2 by FDA under an Emergency Use Authorization (EUA).  This EUA will remain in effect (meaning this test can be used) for the duration of the COVID-19 declaration under Section 564(b)(1) of the Act, 21 U.S.C. section 360bbb-3(b)(1), unless the authorization is terminated or revoked sooner. Performed at [facility], [address] [place], [place] [address]     RADIOLOGY:  Dg Chest Portable 1 View  Result Date: 04/07/2019 CLINICAL DATA:  78 year old male with shortness of breath. EXAM: PORTABLE CHEST 1 VIEW COMPARISON:  Chest radiograph dated 12/01/2015 FINDINGS: There is cardiomegaly with vascular congestion and edema. Pneumonia is not excluded. Clinical correlation is recommended. No pleural effusion pneumothorax. No acute osseous pathology. IMPRESSION: Cardiomegaly with findings of congestive heart failure. Pneumonia is not excluded. Electronically Signed   By: Anner Crete M.D.   On: 04/07/2019 03:28    EKG:   Orders placed or performed during the hospital encounter of 04/07/19  . EKG 12-Lead  . EKG  12-Lead  . EKG 12-Lead  . EKG 12-Lead      Management plans discussed with the patient, family and they are in agreement.  CODE STATUS:     Code Status Orders  (From admission, onward)         Start     Ordered   04/07/19 0711  Full code  Continuous     04/07/19 0710        Code Status History    Date Active Date Inactive Code Status Order ID Comments User Context   12/01/2015 2351 12/08/2015 1559 Full Code [id number]  Constance Haw, MD Inpatient  10/21/2014 1055 10/22/2014 0348 Full Code [id number]  Sandi Mariscal, MD HOV   05/27/2014 1600 05/31/2014 1758 Full Code [id number]  Albertine Patricia, MD Inpatient   05/18/2014 1929 05/25/2014 2321 Full Code [id number]  Estill Bakes Inpatient   Advance Care Planning Activity    Advance Directive Documentation     Most Recent Value  Type of Advance Directive  Living will  Pre-existing out of facility DNR order (yellow form or pink MOST form)  -  "MOST" Form in Place?  -      TOTAL TIME TAKING CARE OF THIS PATIENT: 43  minutes.   Note: This dictation was prepared with Dragon dictation along with smaller phrase technology. Any transcriptional errors that result from this process are unintentional.   @[facility] @  on 04/08/2019 at 10:25 AM  Between 7am to 6pm - Pager - [phone]  After 6pm go to [email or web address] - password [email or web address] [facility]  [facility]  Office  [phone]  CC: Primary care physician; Kirk Ruths, MD

## 2019-04-09 LAB — ECHOCARDIOGRAM COMPLETE
Height: 66 in
Weight: 2838.4 oz

## 2019-04-09 NOTE — Telephone Encounter (Signed)
Attempted to call the patient with results on 9/18.  Patient Cardioverted 9/21.  He was hospitalized from 9/22-9/23 for SOB.   Will forward a copy of his TSH to his PCP.

## 2019-04-10 ENCOUNTER — Telehealth: Payer: Self-pay | Admitting: Internal Medicine

## 2019-04-10 NOTE — Telephone Encounter (Signed)
Sounds good.  Thanks.  Nelva Bush, MD [facility] Pager: [phone]

## 2019-04-10 NOTE — Telephone Encounter (Signed)
Called and spoke with patient's wife, ok per DPR. States patient is doing well this morning. Denies chest pain or shortness of breath. BP was 107/70, HR 81 this morning. Wife verbalized understanding to call 911 or go to the emergency room, if he develops any new or worsening symptoms. They plan to be at appointment on Monday as scheduled.   Routing to Dr End to make him aware

## 2019-04-10 NOTE — Telephone Encounter (Signed)
Patient wife returning phone call from [place] last night, states the message said to have patient come into the office today. Patient is not reporting any symptoms or complaints at this time. No availability to have patient come into [place] office today, however his appt was moved up to Monday at 10:20 with Dr. Saunders Revel, this was the provider who saw patient in hospital. If any further needs, please advise

## 2019-04-12 NOTE — Progress Notes (Signed)
Follow-up Outpatient Visit Date: 04/13/2019  Primary Care Provider: Kirk Ruths, MD [facility] [place] [address]  Primary Cardiologist: Virl Axe, MD  Chief Complaint: Shortness of breath  HPI:  Mr. Lavers is a 78 y.o. year-old male with history of persistent atrial fibrillation status post cardioversion yesterday, recurrent ventricular tachycardia, nonischemic cardiomyopathy with moderately reduced LVEF, hypertension, hyperlipidemia, stroke, and hypothyroidism, who presents for follow-up of acute heart failure.  He underwent cardioversion last Monday.  That night, he developed acute shortness of breath thought to be due to acute on chronic systolic heart failure.  This was likely precipitated by a combination of having held torsemide on the day of his cardioversion as well as excess sodium and fluid (beer) intake.  Echocardiogram performed during his admission showed severely reduced LVEF (20-25%), prompting scheduling of today's appointment for close follow-up.  Today, Mr. Bruss reports that he has noticed more dyspnea on exertion over the last week.  He has not had any further shortness of breath at rest or orthopnea like what led to last week's admission.  He has been trying to limit himself to 2L of fluid intake per day.  He has been compliant with his medications and reports stable weight.  He denies chest pain, palpitations, and lightheadedness.  He and his wife feel like his urine output is not as brisk as it used to be with torsemide 10 mg daily.  --------------------------------------------------------------------------------------------------  Past Medical History:  Diagnosis Date  . Arthritis   . Benign prostatic hypertrophy   . Bursitis of left shoulder   . CHF (congestive heart failure) ([place])   . Diabetes ([place])   . Dyspnea on exertion 11/01/2011  . Elevated PSA   . History of colon polyps   . History of colon polyps    . Hyperlipidemia   . Hypertension   . Melanoma ([place])    under arm  . Microscopic hematuria   . Osteoarthritis   . Retroperitoneal mass   . Stroke ([place]) 05/2014  . TIA (transient ischemic attack)   . Urinary retention   . UTI (urinary tract infection)   . Ventricular tachycardia (HCC)    RBB/LAHB ideopathic VT, 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   Past Surgical History:  Procedure Laterality Date  . CARDIOVERSION    . CARDIOVERSION N/A 03/07/2018   Procedure: CARDIOVERSION;  Surgeon: Wellington Hampshire, MD;  Location: [facility] ORS;  Service: Cardiovascular;  Laterality: N/A;  . CARDIOVERSION N/A 04/06/2019   Procedure: CARDIOVERSION;  Surgeon: Wellington Hampshire, MD;  Location: [facility] ORS;  Service: Cardiovascular;  Laterality: N/A;  . COLONOSCOPY WITH PROPOFOL N/A 10/17/2015   Procedure: COLONOSCOPY WITH PROPOFOL;  Surgeon: Manya Silvas, MD;  Location: [facility] ENDOSCOPY;  Service: Endoscopy;  Laterality: N/A;  . COLONOSCOPY WITH PROPOFOL N/A 03/17/2019   Procedure: COLONOSCOPY WITH PROPOFOL;  Surgeon: Toledo, Benay Pike, MD;  Location: [facility] ENDOSCOPY;  Service: Gastroenterology;  Laterality: N/A;  . ELECTROPHYSIOLOGIC STUDY N/A 12/07/2015   Procedure: V Tach Ablation;  Surgeon: Will Molinaro Leeds, MD;  Location: [place] CV LAB;  Service: Cardiovascular;  Laterality: N/A;  . JOINT REPLACEMENT     R TKR  . RADIOLOGY WITH ANESTHESIA N/A 05/18/2014   Procedure: RADIOLOGY WITH ANESTHESIA;  Surgeon: Rob Hickman, MD;  Location: [place];  Service: Radiology;  Laterality: N/A;  . REPLACEMENT TOTAL KNEE    . REPLACEMENT TOTAL KNEE Left   . VASECTOMY      Current Meds  Medication Sig  . amiodarone (PACERONE) 200 MG tablet Take 1 tablet (200 mg) by mouth once daily  . apixaban (ELIQUIS) 5 MG TABS tablet Take 1 tablet (5 mg total) by mouth 2 (two) times daily.  . Ascorbic Acid (VITAMIN C) 500 MG tablet Take 500 mg by mouth daily.    Marland Kitchen atorvastatin (LIPITOR) 10 MG tablet Take 10 mg by mouth every  evening.   . Cholecalciferol (VITAMIN D3) 1000 UNITS CAPS Take 1,000 Units by mouth daily.   Marland Kitchen docusate sodium (COLACE) 50 MG capsule Take 50 mg by mouth 2 (two) times daily.  . finasteride (PROSCAR) 5 MG tablet Take 1 tablet (5 mg total) by mouth daily.  Marland Kitchen levothyroxine (SYNTHROID) 100 MCG tablet Take 1 tablet (100 mcg total) by mouth daily before breakfast.  . mexiletine (MEXITIL) 150 MG capsule Take 1 capsule (150 mg total) by mouth 2 (two) times daily.  . Multiple Vitamin (MULTIVITAMIN WITH MINERALS) TABS tablet Take 1 tablet by mouth daily. One A Day 50+  . tamsulosin (FLOMAX) 0.4 MG CAPS capsule Take 2 capsules (0.8 mg total) by mouth daily.  Marland Kitchen torsemide (DEMADEX) 10 MG tablet Take 10 mg by mouth daily.     Allergies: Ciprofloxacin, Levofloxacin, Enalapril maleate, Metoprolol, Vasotec [enalaprilat], and Sulfa antibiotics  Social History   Tobacco Use  . Smoking status: Former Smoker    Packs/day: 2.00    Years: 10.00    Pack years: 20.00    Types: Cigarettes    Start date: 02/10/1960    Quit date: 02/09/1970    Years since quitting: 49.2  . Smokeless tobacco: Never Used  . Tobacco comment: 05/2014    quit smoking  45 years ago  Substance Use Topics  . Alcohol use: Yes    Comment: 3-4 beer a week  . Drug use: No    Family History  Problem Relation Age of Onset  . Stroke Father   . Hypertension Father   . Heart attack Neg Hx   . Kidney disease Neg Hx   . Prostate cancer Neg Hx   . Kidney cancer Neg Hx   . Bladder Cancer Neg Hx     Review of Systems: A 12-system review of systems was performed and was negative except as noted in the HPI.  --------------------------------------------------------------------------------------------------  Physical Exam: BP 120/78 (BP Location: Left Arm, Patient Position: Sitting, Cuff Size: Normal)   Pulse 81   Temp (!) 97.3 F (36.3 C)   Ht 5' 6.5" (1.689 m)   Wt 180 lb (81.6 kg)   BMI 28.62 kg/m   General:  NAD HEENT: No  conjunctival pallor or scleral icterus. Moist mucous membranes.  OP clear. Neck: Supple without lymphadenopathy or thyromegaly.  JVP ~8 cm with positive HJR. Lungs: Normal work of breathing. Clear to auscultation bilaterally without wheezes or crackles. Heart: Irregularly irregular with 1/6 systolic murmur. Abd: Bowel sounds present. Soft, NT/ND without hepatosplenomegaly Ext: 1+ pretibial edema bilaterally. Skin: Warm and dry without rash.  EKG:  Atrial flutter with variable block and non-specific T wave changes.  Lab Results  Component Value Date   WBC 7.4 04/07/2019   HGB 16.5 04/07/2019   HCT 49.5 04/07/2019   MCV 98.8 04/07/2019   PLT 160 04/07/2019    Lab Results  Component Value Date   NA 137 04/07/2019   K 3.8 04/07/2019   CL 98 04/07/2019   CO2 26 04/07/2019   BUN 19 04/07/2019   CREATININE 0.92 04/07/2019   GLUCOSE 117 (  H) 04/07/2019   ALT 23 04/07/2019    Lab Results  Component Value Date   CHOL 124 05/19/2014   HDL 38 (L) 05/19/2014   LDLCALC 58 05/19/2014   TRIG 142 05/19/2014   CHOLHDL 3.3 05/19/2014    --------------------------------------------------------------------------------------------------  ASSESSMENT AND PLAN: Acute on chronic HFrEF: Worsening DOE noted compared with pre-cardioversion a week ago.  Mr. Tarzia appears mildly volume overloaded with NYHA class III symptoms at that point.  Echo last week showed decline in LVEF from 40-45% in 2017 to 20-25%.  We will check a BMP today and plan to increase torsemide to 20 mg daily.  I will also start carvedilol 3.125 mg BID.  There is an allergy to metoprolol listed (edema) but Mr. Kirkman does not recall any significant reaction to beta-blockers in the past.  Given report of angioedema with ACEI's in the past, I will defer trial of an ARB/ARNI.  My next choice would be to add spironolactone if blood pressure, renal function, and potassium allow.  Ischemia evaluation (favor cath) will need to be  considered in the near future, but I would like to defer this at least a few weeks in order to optimize Mr. Maddalena's volume status and to also maintain him on uninterrupted anticoagulation for at least 4 weeks from the time of his cardioversion.  Atrial flutter: Previously, Mr. Scaturro was noted to be in a-fib but underwent successful DCCV last week.  He appears to be in atrial flutter today with variable block and reasonable ventricular rate control.  As above, we will add low-dose carvedilol.  Mr. Sedor should continue current doses of amiodarone and apixaban.  I will defer ongoing management to Dr. Caryl Comes.  History of ventricular tachycardia: Continue current doses of amiodarone and mexiletine.  I will try to move up appointment with Dr. Caryl Comes to 2 weeks from now.  Follow-up: RTC ini 2 weeks (Dr. Caryl Comes if possible, otherwise with me or an APP)  Nelva Bush, MD 04/13/2019 10:41 AM

## 2019-04-13 ENCOUNTER — Encounter: Payer: Self-pay | Admitting: Internal Medicine

## 2019-04-13 ENCOUNTER — Ambulatory Visit (INDEPENDENT_AMBULATORY_CARE_PROVIDER_SITE_OTHER): Payer: Medicare Other | Admitting: Internal Medicine

## 2019-04-13 ENCOUNTER — Other Ambulatory Visit: Payer: Self-pay

## 2019-04-13 VITALS — BP 120/78 | HR 81 | Temp 97.3°F | Ht 66.5 in | Wt 180.0 lb

## 2019-04-13 DIAGNOSIS — Z8679 Personal history of other diseases of the circulatory system: Secondary | ICD-10-CM | POA: Diagnosis not present

## 2019-04-13 DIAGNOSIS — I4892 Unspecified atrial flutter: Secondary | ICD-10-CM | POA: Diagnosis not present

## 2019-04-13 DIAGNOSIS — I4819 Other persistent atrial fibrillation: Secondary | ICD-10-CM

## 2019-04-13 DIAGNOSIS — I5023 Acute on chronic systolic (congestive) heart failure: Secondary | ICD-10-CM | POA: Diagnosis not present

## 2019-04-13 MED ORDER — TORSEMIDE 20 MG PO TABS: 20.0000 mg | ORAL_TABLET | Freq: Two times a day (BID) | ORAL | 1 refills | Status: DC

## 2019-04-13 MED ORDER — CARVEDILOL 3.125 MG PO TABS: 3.1250 mg | ORAL_TABLET | Freq: Two times a day (BID) | ORAL | 2 refills | Status: DC

## 2019-04-13 NOTE — Patient Instructions (Signed)
Medication Instructions:  Your physician has recommended you make the following change in your medication:  1- START Carvedilol 3.125 mg by  Mouth two times a day. 2- INCREASE Torsemide to 20 mg by mouth once a day starting tomorrow.   If you need a refill on your cardiac medications before your next appointment, please call your pharmacy.   Lab work: Your physician recommends that you return for lab work in: [place] - BMET.  If you have labs (blood work) drawn today and your tests are completely normal, you will receive your results only by: Marland Kitchen MyChart Message (if you have MyChart) OR . A paper copy in the mail If you have any lab test that is abnormal or we need to change your treatment, we will call you to review the results.  Testing/Procedures: NONE  Follow-Up: At [facility], you and your health needs are our priority.  As part of our continuing mission to provide you with exceptional heart care, we have created designated Provider Care Teams.  These Care Teams include your primary Cardiologist (physician) and Advanced Practice Providers (APPs -  Physician Assistants and Nurse Practitioners) who all work together to provide you with the care you need, when you need it. You will need a follow up appointment in 2  weeks with Dr End OR move appointment with Dr Caryl Comes to be sooner - within the next 2 weeks.  You may see DR Harrell Gave END or one of the following Advanced Practice Providers on your designated Care Team:   Murray Hodgkins, NP Christell Faith, PA-C . Marrianne Mood, PA-C

## 2019-04-14 ENCOUNTER — Encounter: Payer: Self-pay | Admitting: Internal Medicine

## 2019-04-14 ENCOUNTER — Telehealth: Payer: Self-pay | Admitting: *Deleted

## 2019-04-14 ENCOUNTER — Ambulatory Visit: Payer: Medicare Other | Admitting: Family

## 2019-04-14 DIAGNOSIS — Z8679 Personal history of other diseases of the circulatory system: Secondary | ICD-10-CM | POA: Insufficient documentation

## 2019-04-14 LAB — BASIC METABOLIC PANEL
BUN/Creatinine Ratio: 19 (ref 10–24)
BUN: 17 mg/dL (ref 8–27)
CO2: 24 mmol/L (ref 20–29)
Calcium: 8.8 mg/dL (ref 8.6–10.2)
Chloride: 100 mmol/L (ref 96–106)
Creatinine, Ser: 0.91 mg/dL (ref 0.76–1.27)
GFR calc Af Amer: 92 mL/min/{1.73_m2} (ref >59)
GFR calc non Af Amer: 79 mL/min/{1.73_m2} (ref >59)
Glucose: 109 mg/dL — ABNORMAL HIGH (ref 65–99)
Potassium: 4.1 mmol/L (ref 3.5–5.2)
Sodium: 137 mmol/L (ref 134–144)

## 2019-04-14 MED ORDER — TORSEMIDE 20 MG PO TABS: 20.0000 mg | ORAL_TABLET | Freq: Every day | ORAL | 1 refills | Status: DC

## 2019-04-14 NOTE — Telephone Encounter (Signed)
-----   Message from Austin Bush, MD sent at 04/14/2019  7:34 AM EDT ----- Please let [name] know that his kidney function and electrolytes are normal.  He should proceed with increasing torsemide to 20 mg daily.  Repeat BMP will be needed when he follows up in ~2 weeks.

## 2019-04-14 NOTE — Telephone Encounter (Signed)
Spoke with patient's wife, ok per DPR.  She verbalized understanding of the results. She wanted to clarify the prescription. We sent the prescription in by mistake saying torsemide 20 mg twice a day. Advised her that it is to be Torsemide 20 mg once a day and she verbalized understanding. I sent in the correct prescription to the pharmacy now.  She was appreciative and are aware of f/u appointment in 2 weeks that he may have repeat lab work.

## 2019-04-27 ENCOUNTER — Ambulatory Visit (INDEPENDENT_AMBULATORY_CARE_PROVIDER_SITE_OTHER): Payer: Medicare Other | Admitting: Internal Medicine

## 2019-04-27 ENCOUNTER — Encounter: Payer: Self-pay | Admitting: Internal Medicine

## 2019-04-27 ENCOUNTER — Other Ambulatory Visit: Payer: Self-pay

## 2019-04-27 VITALS — BP 108/64 | HR 81 | Ht 66.0 in | Wt 178.2 lb

## 2019-04-27 DIAGNOSIS — Z79899 Other long term (current) drug therapy: Secondary | ICD-10-CM | POA: Diagnosis not present

## 2019-04-27 DIAGNOSIS — I4892 Unspecified atrial flutter: Secondary | ICD-10-CM

## 2019-04-27 DIAGNOSIS — I5022 Chronic systolic (congestive) heart failure: Secondary | ICD-10-CM | POA: Diagnosis not present

## 2019-04-27 DIAGNOSIS — Z8679 Personal history of other diseases of the circulatory system: Secondary | ICD-10-CM | POA: Diagnosis not present

## 2019-04-27 DIAGNOSIS — I428 Other cardiomyopathies: Secondary | ICD-10-CM

## 2019-04-27 MED ORDER — SPIRONOLACTONE 25 MG PO TABS: 25.0000 mg | ORAL_TABLET | Freq: Every day | ORAL | 6 refills | Status: DC

## 2019-04-27 NOTE — Progress Notes (Signed)
Follow-up Outpatient Visit Date: 04/27/2019  Primary Care Provider: Kirk Ruths, MD [facility] [place] [address]  Chief Complaint: Follow-up heart failure and atrial fibrillation/flutter  HPI:  Austin Warren is a 78 y.o. year-old male with history of persistent atrial fibrillation status post cardioversion on 04/06/2019, recurrent ventricular tachycardia, nonischemic cardiomyopathy with moderately reduced LVEF, hypertension, hyperlipidemia, stroke, and hypothyroidism, who presents for follow-up of acute systolic heart failure and atrial fibrillation/flutter.  I last saw Austin Warren 2 weeks ago at which time he reported increasing exertional dyspnea.  Echocardiogram during his hospitalization in late September was notable for decline in LVEF from 40-45% in 2017 to 20-25%.  At our visit 2 weeks ago, we agreed to increase torsemide to 20 mg daily and also start carvedilol 3.125 mg twice daily.  Echocardiogram at our last visit also showed atrial flutter.  Previous doses of amiodarone and apixaban were continued.  EP follow-up as scheduled with Dr. Caryl Comes on 05/14/2019.  Today, Austin Warren reports feeling a little better than at our last visit, though he is unable to specify why.  He has stable exertional dyspnea, which in retrospect, has been present for months.  He has not had any episodes of acute dyspnea like what led to his ED visit last month following cardioversion.  He denies chest pain, palpitations, and lightheadedness.  He has not paid much attention to leg swelling.  Weight has been stable.  He is tolerating his current medications, including daily amiodarone and recently added carvedilol.  --------------------------------------------------------------------------------------------------  Past Medical History:  Diagnosis Date  . Arthritis   . Benign prostatic hypertrophy   . Bursitis of left shoulder   . CHF (congestive heart failure)  ([place])   . Diabetes ([place])   . Dyspnea on exertion 11/01/2011  . Elevated PSA   . History of colon polyps   . History of colon polyps   . Hyperlipidemia   . Hypertension   . Melanoma ([place])    under arm  . Microscopic hematuria   . Osteoarthritis   . Retroperitoneal mass   . Stroke ([place]) 05/2014  . TIA (transient ischemic attack)   . Urinary retention   . UTI (urinary tract infection)   . Ventricular tachycardia (HCC)    RBB/LAHB ideopathic VT, 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   Past Surgical History:  Procedure Laterality Date  . CARDIOVERSION    . CARDIOVERSION N/A 03/07/2018   Procedure: CARDIOVERSION;  Surgeon: Wellington Hampshire, MD;  Location: [facility] ORS;  Service: Cardiovascular;  Laterality: N/A;  . CARDIOVERSION N/A 04/06/2019   Procedure: CARDIOVERSION;  Surgeon: Wellington Hampshire, MD;  Location: [facility] ORS;  Service: Cardiovascular;  Laterality: N/A;  . COLONOSCOPY WITH PROPOFOL N/A 10/17/2015   Procedure: COLONOSCOPY WITH PROPOFOL;  Surgeon: Manya Silvas, MD;  Location: [facility] ENDOSCOPY;  Service: Endoscopy;  Laterality: N/A;  . COLONOSCOPY WITH PROPOFOL N/A 03/17/2019   Procedure: COLONOSCOPY WITH PROPOFOL;  Surgeon: Toledo, Benay Pike, MD;  Location: [facility] ENDOSCOPY;  Service: Gastroenterology;  Laterality: N/A;  . ELECTROPHYSIOLOGIC STUDY N/A 12/07/2015   Procedure: V Tach Ablation;  Surgeon: Will Start Leeds, MD;  Location: [place] CV LAB;  Service: Cardiovascular;  Laterality: N/A;  . JOINT REPLACEMENT     R TKR  . RADIOLOGY WITH ANESTHESIA N/A 05/18/2014   Procedure: RADIOLOGY WITH ANESTHESIA;  Surgeon: Rob Hickman, MD;  Location: [place];  Service: Radiology;  Laterality: N/A;  . REPLACEMENT TOTAL KNEE    .  REPLACEMENT TOTAL KNEE Left   . VASECTOMY      Current Meds  Medication Sig  . amiodarone (PACERONE) 200 MG tablet Take 1 tablet (200 mg) by mouth once daily  . apixaban (ELIQUIS) 5 MG TABS tablet Take 1 tablet (5 mg total) by mouth 2 (two) times daily.  .  Ascorbic Acid (VITAMIN C) 500 MG tablet Take 500 mg by mouth daily.    Marland Kitchen atorvastatin (LIPITOR) 10 MG tablet Take 10 mg by mouth every evening.   . carvedilol (COREG) 3.125 MG tablet Take 1 tablet (3.125 mg total) by mouth 2 (two) times daily.  . Cholecalciferol (VITAMIN D3) 1000 UNITS CAPS Take 1,000 Units by mouth daily.   Marland Kitchen docusate sodium (COLACE) 50 MG capsule Take 50 mg by mouth 2 (two) times daily.  . finasteride (PROSCAR) 5 MG tablet Take 1 tablet (5 mg total) by mouth daily.  Marland Kitchen levothyroxine (SYNTHROID) 100 MCG tablet Take 1 tablet (100 mcg total) by mouth daily before breakfast.  . mexiletine (MEXITIL) 150 MG capsule Take 1 capsule (150 mg total) by mouth 2 (two) times daily.  . Multiple Vitamin (MULTIVITAMIN WITH MINERALS) TABS tablet Take 1 tablet by mouth daily. One A Day 50+  . tamsulosin (FLOMAX) 0.4 MG CAPS capsule Take 2 capsules (0.8 mg total) by mouth daily.  Marland Kitchen torsemide (DEMADEX) 20 MG tablet Take 1 tablet (20 mg total) by mouth daily.    Allergies: Ciprofloxacin, Levofloxacin, Enalapril maleate, Metoprolol, Vasotec [enalaprilat], and Sulfa antibiotics  Social History   Tobacco Use  . Smoking status: Former Smoker    Packs/day: 2.00    Years: 10.00    Pack years: 20.00    Types: Cigarettes    Start date: 02/10/1960    Quit date: 02/09/1970    Years since quitting: 49.2  . Smokeless tobacco: Never Used  . Tobacco comment: 05/2014    quit smoking  45 years ago  Substance Use Topics  . Alcohol use: Yes    Comment: 3-4 beer a week  . Drug use: No    Family History  Problem Relation Age of Onset  . Stroke Father   . Hypertension Father   . Heart attack Neg Hx   . Kidney disease Neg Hx   . Prostate cancer Neg Hx   . Kidney cancer Neg Hx   . Bladder Cancer Neg Hx     Review of Systems: A 12-system review of systems was performed and was negative except as noted in the HPI.   --------------------------------------------------------------------------------------------------  Physical Exam: BP 108/64 (BP Location: Left Arm, Patient Position: Sitting, Cuff Size: Normal)   Pulse 81   Ht 5\' 6"  (1.676 m)   Wt 178 lb 4 oz (80.9 kg)   SpO2 98%   BMI 28.77 kg/m   General: NAD.  Accompanied by his wife. HEENT: No conjunctival pallor or scleral icterus.  Facemask in place. Neck: Supple without lymphadenopathy, thyromegaly, JVD, or HJR. Lungs: Normal work of breathing. Clear to auscultation bilaterally without wheezes or crackles. Heart: Irregularly irregular with 1/6 systolic murmur. Abd: Bowel sounds present. Soft, NT/ND without hepatosplenomegaly Ext: Trace to 1+ ankle edema bilaterally. Skin: Warm and dry without rash.  EKG: Atrial flutter with variable conduction and PVCs versus aberrancy.  Nonspecific T wave changes.  Lab Results  Component Value Date   WBC 7.4 04/07/2019   HGB 16.5 04/07/2019   HCT 49.5 04/07/2019   MCV 98.8 04/07/2019   PLT 160 04/07/2019    Lab Results  Component Value Date   NA 137 04/13/2019   K 4.1 04/13/2019   CL 100 04/13/2019   CO2 24 04/13/2019   BUN 17 04/13/2019   CREATININE 0.91 04/13/2019   GLUCOSE 109 (H) 04/13/2019   ALT 23 04/07/2019    Lab Results  Component Value Date   CHOL 124 05/19/2014   HDL 38 (L) 05/19/2014   LDLCALC 58 05/19/2014   TRIG 142 05/19/2014   CHOLHDL 3.3 05/19/2014    --------------------------------------------------------------------------------------------------  ASSESSMENT AND PLAN: Chronic systolic heart failure: Austin Warren has lost 2 pounds since our last visit 2 weeks ago.  He has mild lower extremity edema but no significant JVD.  Though he still has exertional dyspnea, he overall feels better.  We will continue current doses of torsemide 20 mg daily and carvedilol 3.125 mg twice daily.  We have agreed to add spironolactone 25 mg daily.  I will check a basic metabolic panel  today and again in a week.  We will continue to defer addition of an ACE inhibitor or ARB in the setting of angioedema with ACE inhibitors in the past.  We will need to consider ischemia evaluation in the future, though I would like to optimize Austin Warren's heart failure regimen and come up with a definitive plan for his atrial arrhythmia.  Atrial flutter: Ventricular rate controlled.  Austin Warren is asymptomatic.  He should continue apixaban and amiodarone.  He is scheduled for follow-up with Dr. Caryl Comes later this month.  History of ventricular tachycardia: No symptoms to suggest recurrence.  Continue amiodarone, carvedilol, and mexiletine.  Follow-up with Dr. Caryl Comes as scheduled.  Follow-up: Return to clinic in 6 weeks.  Nelva Bush, MD 04/27/2019 2:51 PM

## 2019-04-27 NOTE — Patient Instructions (Signed)
Medication Instructions:  Your physician has recommended you make the following change in your medication:  1- START Spironolactone 25 mg (1 tablet) by mouth once a day.  If you need a refill on your cardiac medications before your next appointment, please call your pharmacy.   Lab work: 1) Your physician recommends that you return for lab work in: [place].  2) Your physician recommends that you return for lab work on Friday 05/01/19 or Monday 05/04/19. - BMET -  Please go to the [facility]. You will check in at the front desk to the right as you walk into the atrium. Valet Parking is offered if needed. - No appointment needed. You may go any day between 7 am and 6 pm.  If you have labs (blood work) drawn today and your tests are completely normal, you will receive your results only by: Marland Kitchen MyChart Message (if you have MyChart) OR . A paper copy in the mail If you have any lab test that is abnormal or we need to change your treatment, we will call you to review the results.  Testing/Procedures: NONE  Follow-Up: At [facility], you and your health needs are our priority.  As part of our continuing mission to provide you with exceptional heart care, we have created designated Provider Care Teams.  These Care Teams include your primary Cardiologist (physician) and Advanced Practice Providers (APPs -  Physician Assistants and Nurse Practitioners) who all work together to provide you with the care you need, when you need it. You will need a follow up appointment in 6 weeks.  You may see DR Harrell Gave END or one of the following Advanced Practice Providers on your designated Care Team:   Murray Hodgkins, NP Christell Faith, PA-C . Marrianne Mood, PA-C

## 2019-04-28 ENCOUNTER — Ambulatory Visit: Payer: Medicare Other | Admitting: Nurse Practitioner

## 2019-04-28 LAB — BASIC METABOLIC PANEL
BUN/Creatinine Ratio: 21 (ref 10–24)
BUN: 21 mg/dL (ref 8–27)
CO2: 20 mmol/L (ref 20–29)
Calcium: 9.3 mg/dL (ref 8.6–10.2)
Chloride: 100 mmol/L (ref 96–106)
Creatinine, Ser: 0.98 mg/dL (ref 0.76–1.27)
GFR calc Af Amer: 84 mL/min/{1.73_m2} (ref >59)
GFR calc non Af Amer: 73 mL/min/{1.73_m2} (ref >59)
Glucose: 102 mg/dL — ABNORMAL HIGH (ref 65–99)
Potassium: 4.6 mmol/L (ref 3.5–5.2)
Sodium: 138 mmol/L (ref 134–144)

## 2019-04-29 ENCOUNTER — Encounter: Payer: Self-pay | Admitting: Internal Medicine

## 2019-05-04 ENCOUNTER — Other Ambulatory Visit
Admission: RE | Admit: 2019-05-04 | Discharge: 2019-05-04 | Disposition: A | Discharge status: Home / Self Care | Payer: Medicare Other | Attending: Internal Medicine | Admitting: Internal Medicine

## 2019-05-04 DIAGNOSIS — Z79899 Other long term (current) drug therapy: Secondary | ICD-10-CM | POA: Insufficient documentation

## 2019-05-04 DIAGNOSIS — I5022 Chronic systolic (congestive) heart failure: Secondary | ICD-10-CM | POA: Diagnosis present

## 2019-05-04 DIAGNOSIS — I4892 Unspecified atrial flutter: Secondary | ICD-10-CM | POA: Diagnosis present

## 2019-05-04 LAB — BASIC METABOLIC PANEL
Anion gap: 10 (ref 5–15)
BUN: 26 mg/dL — ABNORMAL HIGH (ref 8–23)
CO2: 26 mmol/L (ref 22–32)
Calcium: 9.3 mg/dL (ref 8.9–10.3)
Chloride: 100 mmol/L (ref 98–111)
Creatinine, Ser: 1.08 mg/dL (ref 0.61–1.24)
GFR calc Af Amer: >60 mL/min (ref >60)
GFR calc non Af Amer: >60 mL/min (ref >60)
Glucose, Bld: 132 mg/dL — ABNORMAL HIGH (ref 70–99)
Potassium: 4.7 mmol/L (ref 3.5–5.1)
Sodium: 136 mmol/L (ref 135–145)

## 2019-05-05 ENCOUNTER — Telehealth: Payer: Self-pay | Admitting: *Deleted

## 2019-05-05 MED ORDER — TORSEMIDE 20 MG PO TABS: ORAL_TABLET | ORAL | 1 refills | Status: DC

## 2019-05-05 NOTE — Telephone Encounter (Signed)
Results called to patient's wife, ok per dpr. She verbalized understanding of results and how to take torsemide. Patient has upcoming appointment with Dr End in November about a month away and will plan on getting lab work on that day. Med list updated.

## 2019-05-05 NOTE — Telephone Encounter (Signed)
-----   Message from Nelva Bush, MD sent at 05/04/2019  2:26 PM EDT ----- Please let Austin Warren know that his BUN and creatinine are up slightly, which may reflect an element of dehydration.  His potassium is normal.  I recommend that he decrease his torsemide back to 10 mg daily, though if he gains 2 pounds in a day or 5 pounds in a week, he should return to 20 mg daily until his weight returns to baseline.  He will need a repeat BMP in ~1 month.

## 2019-05-14 ENCOUNTER — Ambulatory Visit (INDEPENDENT_AMBULATORY_CARE_PROVIDER_SITE_OTHER): Payer: Medicare Other | Admitting: Internal Medicine

## 2019-05-14 ENCOUNTER — Encounter: Payer: Self-pay | Admitting: Internal Medicine

## 2019-05-14 ENCOUNTER — Other Ambulatory Visit: Payer: Self-pay

## 2019-05-14 VITALS — BP 106/70 | HR 75 | Ht 66.0 in | Wt 175.0 lb

## 2019-05-14 DIAGNOSIS — Z79899 Other long term (current) drug therapy: Secondary | ICD-10-CM | POA: Diagnosis not present

## 2019-05-14 DIAGNOSIS — I4819 Other persistent atrial fibrillation: Secondary | ICD-10-CM | POA: Diagnosis not present

## 2019-05-14 DIAGNOSIS — I428 Other cardiomyopathies: Secondary | ICD-10-CM

## 2019-05-14 DIAGNOSIS — I5022 Chronic systolic (congestive) heart failure: Secondary | ICD-10-CM | POA: Diagnosis not present

## 2019-05-14 DIAGNOSIS — Z01812 Encounter for preprocedural laboratory examination: Secondary | ICD-10-CM

## 2019-05-14 MED ORDER — AMIODARONE HCL 200 MG PO TABS: ORAL_TABLET | ORAL | Status: DC

## 2019-05-14 NOTE — Progress Notes (Signed)
Patient Care Team: Kirk Ruths, MD as PCP - General (Unknown Physician Specialty)   HPI  Austin Warren is a 78 y.o. male Seen in followup for a ventricular tachycardia with a right bundle superior axis relatively narrow QRS complex and positive concordance suggesting a septal origin and possible verapamil sensitivity. Catheterization had demonstrated normal coronary arteries and normal left ventricular function. ECG was notable for marked reduction in voltage   Signal average Electrocardiogram was markedly abnormal.  MRI 4/16>>There is no significant change from the prior study with persistent basal inferior and inferolateral late gadolinium enhancement slightly more pronounced on the current study and a small focal late gadolinium enhancement at the point of inferior RV attachment to the LV wall.  He also underwent fat pad biopsy>> neg  Echocardiogram 11/15 demonstrated left ventricular hypertrophy with an ejection fraction of 45-50% with hypokinesis of the inferior apical myocardium   We initially tried him on a beta blocker prescription. He had recurrent ventricular tachycardia. He was put on verapamil.   Had recurrent tachycardia >>>  started on mexiletine.  5/17 he was transferred to [facility] in [place]. Evaluation included an echo with EF of 40-45%. Cardiac MR demonstrated EF of 43% with inferolateral and apical hypokinesis with inferolateral basilar scar which was if anything not worse compared to prior MR 4/16. He underwent EP testing. No scar was found and no ablation undertaken. Ventricular flutter was inducible. VT apparently was not. There have been discussions regarding ICD implantation. He elected to defer that decision He was discharged on increased mexiletine. He was seen again 6/17 for recurrent ventricular tachycardia. ECG was reviewed. Right bundle superior axis at a cycle length of just under 300 ms  6/17 started on amiodarone  Bradycardia required down titration of  amiodarone now on combination with mexiletine for ventricular tachycardia   He  had  stroke  11/15. He was found to be in atrial fibrillation. He was treated with TPA. Apixaban was initiated.  He has intellectual consequences and aphasia; these are improving    8/19 found to be in AFib DCCV>> to assess benefit of sinus rhythm   Date Cr K TSH Hgb LFTs PFTs  6/17  0.94 3.9 2.8  25    8/18 0.9 4.0 7.5  16   1/19   4.99     2/19 0.8 4.0 3.9  18   8/19 0.86 4.6  15.5    2/20 0.9 4.0 4.87  22   10/20 1.08 4.7 6.7 16.5 23      DATE Rate PR interval QRSduration Dose-Amio  10/18 62  162 112 200  1/18 54 212 112 200  1/19  66  206  104  200 x 5/w  12/19 58 220 106 200 x 5/w          He underwent cardioversion.  That he developed acute congestive heart failure.  He was brought to hospital treated with BiPAP and diuresis.  Unfortunately he had reverted to atrial fibrillation.  His weights have been stable.  Torsemide dose has been decreased because of a bump in his creatinine as noted above.  No orthopnea or nocturnal dyspnea.  He does however have limiting dyspnea on exertion not withstanding his atrial fibrillation is well rate controlled  Patient denies symptoms of GI intolerance, sun sensitivity, neurological symptoms attributable to amiodarone.      Past Medical History:  Diagnosis Date  . Arthritis   . Benign prostatic hypertrophy   . Bursitis of left  shoulder   . CHF (congestive heart failure) ([place])   . Diabetes ([place])   . Dyspnea on exertion 11/01/2011  . Elevated PSA   . History of colon polyps   . History of colon polyps   . Hyperlipidemia   . Hypertension   . Melanoma ([place])    under arm  . Microscopic hematuria   . Osteoarthritis   . Retroperitoneal mass   . Stroke ([place]) 05/2014  . TIA (transient ischemic attack)   . Urinary retention   . UTI (urinary tract infection)   . Ventricular tachycardia (HCC)    RBB/LAHB ideopathic VT, 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     Past Surgical History:  Procedure Laterality Date  . CARDIOVERSION    . CARDIOVERSION N/A 03/07/2018   Procedure: CARDIOVERSION;  Surgeon: Wellington Hampshire, MD;  Location: [facility] ORS;  Service: Cardiovascular;  Laterality: N/A;  . CARDIOVERSION N/A 04/06/2019   Procedure: CARDIOVERSION;  Surgeon: Wellington Hampshire, MD;  Location: [facility] ORS;  Service: Cardiovascular;  Laterality: N/A;  . COLONOSCOPY WITH PROPOFOL N/A 10/17/2015   Procedure: COLONOSCOPY WITH PROPOFOL;  Surgeon: Manya Silvas, MD;  Location: [facility] ENDOSCOPY;  Service: Endoscopy;  Laterality: N/A;  . COLONOSCOPY WITH PROPOFOL N/A 03/17/2019   Procedure: COLONOSCOPY WITH PROPOFOL;  Surgeon: Toledo, Benay Pike, MD;  Location: [facility] ENDOSCOPY;  Service: Gastroenterology;  Laterality: N/A;  . ELECTROPHYSIOLOGIC STUDY N/A 12/07/2015   Procedure: V Tach Ablation;  Surgeon: Will Carradine Leeds, MD;  Location: [place] CV LAB;  Service: Cardiovascular;  Laterality: N/A;  . JOINT REPLACEMENT     R TKR  . RADIOLOGY WITH ANESTHESIA N/A 05/18/2014   Procedure: RADIOLOGY WITH ANESTHESIA;  Surgeon: Rob Hickman, MD;  Location: [place];  Service: Radiology;  Laterality: N/A;  . REPLACEMENT TOTAL KNEE    . REPLACEMENT TOTAL KNEE Left   . VASECTOMY      Current Outpatient Medications  Medication Sig Dispense Refill  . amiodarone (PACERONE) 200 MG tablet Take 1 tablet (200 mg) by mouth once daily 90 tablet 3  . apixaban (ELIQUIS) 5 MG TABS tablet Take 1 tablet (5 mg total) by mouth 2 (two) times daily. 180 tablet 3  . Ascorbic Acid (VITAMIN C) 500 MG tablet Take 500 mg by mouth daily.      Marland Kitchen atorvastatin (LIPITOR) 10 MG tablet Take 10 mg by mouth every evening.     . carvedilol (COREG) 3.125 MG tablet Take 1 tablet (3.125 mg total) by mouth 2 (two) times daily. 60 tablet 2  . Cholecalciferol (VITAMIN D3) 1000 UNITS CAPS Take 1,000 Units by mouth daily.     Marland Kitchen docusate sodium (COLACE) 50 MG capsule Take 50 mg by mouth 2 (two) times daily.     . finasteride (PROSCAR) 5 MG tablet Take 1 tablet (5 mg total) by mouth daily. 90 tablet 3  . levothyroxine (SYNTHROID) 100 MCG tablet Take 1 tablet (100 mcg total) by mouth daily before breakfast. 30 tablet 0  . mexiletine (MEXITIL) 150 MG capsule Take 1 capsule (150 mg total) by mouth 2 (two) times daily. 180 capsule 3  . Multiple Vitamin (MULTIVITAMIN WITH MINERALS) TABS tablet Take 1 tablet by mouth daily. One A Day 50+    . spironolactone (ALDACTONE) 25 MG tablet Take 1 tablet (25 mg total) by mouth daily. 30 tablet 6  . tamsulosin (FLOMAX) 0.4 MG CAPS capsule Take 2 capsules (0.8 mg total) by mouth daily. 180 capsule 3  . torsemide (DEMADEX) 20 MG tablet Take  10 mg (0.5 tablet) by mount once daily, though if he gains 2 pounds in a day or 5 pounds in a week, he should return to 20 mg daily until his weight returns to baseline. 90 tablet 1   No current facility-administered medications for this visit.     Allergies  Allergen Reactions  . Ciprofloxacin Anaphylaxis  . Levofloxacin Anaphylaxis    "throat closes"  . Enalapril Maleate Swelling    Angioedema  . Metoprolol Swelling    edema  . Vasotec [Enalaprilat] Other (See Comments)    Reaction: Angioedema  . Sulfa Antibiotics Rash and Other (See Comments)    Hypotension    Review of Systems negative except from HPI and PMH  Physical Exam BP 106/70 (BP Location: Left Arm, Patient Position: Sitting, Cuff Size: Normal)   Pulse 75   Ht 5\' 6"  (1.676 m)   Wt 175 lb (79.4 kg)   BMI 28.25 kg/m  Well developed and nourished in no acute distress HENT normal Neck supple with JVP-flat Carotids brisk and full without bruits Clear Irregularly irregular rate and rhythm with controlled ventricular response, no murmurs or gallops Abd-soft with active BS without hepatomegaly No Clubbing cyanosis tr edema Skin-warm and dry A & Oriented  Grossly normal sensory and motor function   ECG atrial fibrillation at  73 Interval-/09/39  Assessment and  Plan  Ventricular tachycardia  Low-voltage ECG  Cardiomyopathy-nonischemic abnormal signal average ECG/MRI negative fat pad biopsy  Persistent atrial fibrillation  Stroke   Sinus bradycardia  Hypothyroidism-iatrogenic-treated  High Risk Medication Surveillance amiodarone      Discussed whether we repeat cardioversion or not.  Given his ongoing breathlessness being at base weight and well rate controlled, we have elected to repeat cardioversion.  In anticipation will increase his amiodarone from 200--400 mg a day anticipate that for about 2 months with a cardioversion in between.  Continue torsemide adjusting as needed for weight.  No interval ventricular tachycardia  No interval bleeding  We spent more than 50% of our >25 min visit in face to face counseling regarding the above

## 2019-05-14 NOTE — H&P (View-Only) (Signed)
Patient Care Team: Kirk Ruths, MD as PCP - General (Unknown Physician Specialty)   HPI  Austin Warren is a 78 y.o. male Seen in followup for a ventricular tachycardia with a right bundle superior axis relatively narrow QRS complex and positive concordance suggesting a septal origin and possible verapamil sensitivity. Catheterization had demonstrated normal coronary arteries and normal left ventricular function. ECG was notable for marked reduction in voltage   Signal average Electrocardiogram was markedly abnormal.  MRI 4/16>>There is no significant change from the prior study with persistent basal inferior and inferolateral late gadolinium enhancement slightly more pronounced on the current study and a small focal late gadolinium enhancement at the point of inferior RV attachment to the LV wall.  He also underwent fat pad biopsy>> neg  Echocardiogram 11/15 demonstrated left ventricular hypertrophy with an ejection fraction of 45-50% with hypokinesis of the inferior apical myocardium   We initially tried him on a beta blocker prescription. He had recurrent ventricular tachycardia. He was put on verapamil.   Had recurrent tachycardia >>>  started on mexiletine.  5/17 he was transferred to [facility] in [place]. Evaluation included an echo with EF of 40-45%. Cardiac MR demonstrated EF of 43% with inferolateral and apical hypokinesis with inferolateral basilar scar which was if anything not worse compared to prior MR 4/16. He underwent EP testing. No scar was found and no ablation undertaken. Ventricular flutter was inducible. VT apparently was not. There have been discussions regarding ICD implantation. He elected to defer that decision He was discharged on increased mexiletine. He was seen again 6/17 for recurrent ventricular tachycardia. ECG was reviewed. Right bundle superior axis at a cycle length of just under 300 ms  6/17 started on amiodarone  Bradycardia required down titration of  amiodarone now on combination with mexiletine for ventricular tachycardia   He  had  stroke  11/15. He was found to be in atrial fibrillation. He was treated with TPA. Apixaban was initiated.  He has intellectual consequences and aphasia; these are improving    8/19 found to be in AFib DCCV>> to assess benefit of sinus rhythm   Date Cr K TSH Hgb LFTs PFTs  6/17  0.94 3.9 2.8  25    8/18 0.9 4.0 7.5  16   1/19   4.99     2/19 0.8 4.0 3.9  18   8/19 0.86 4.6  15.5    2/20 0.9 4.0 4.87  22   10/20 1.08 4.7 6.7 16.5 23      DATE Rate PR interval QRSduration Dose-Amio  10/18 62  162 112 200  1/18 54 212 112 200  1/19  66  206  104  200 x 5/w  12/19 58 220 106 200 x 5/w          He underwent cardioversion.  That he developed acute congestive heart failure.  He was brought to hospital treated with BiPAP and diuresis.  Unfortunately he had reverted to atrial fibrillation.  His weights have been stable.  Torsemide dose has been decreased because of a bump in his creatinine as noted above.  No orthopnea or nocturnal dyspnea.  He does however have limiting dyspnea on exertion not withstanding his atrial fibrillation is well rate controlled  Patient denies symptoms of GI intolerance, sun sensitivity, neurological symptoms attributable to amiodarone.      Past Medical History:  Diagnosis Date  . Arthritis   . Benign prostatic hypertrophy   . Bursitis of left  shoulder   . CHF (congestive heart failure) ([place])   . Diabetes ([place])   . Dyspnea on exertion 11/01/2011  . Elevated PSA   . History of colon polyps   . History of colon polyps   . Hyperlipidemia   . Hypertension   . Melanoma ([place])    under arm  . Microscopic hematuria   . Osteoarthritis   . Retroperitoneal mass   . Stroke ([place]) 05/2014  . TIA (transient ischemic attack)   . Urinary retention   . UTI (urinary tract infection)   . Ventricular tachycardia (HCC)    RBB/LAHB ideopathic VT, 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     Past Surgical History:  Procedure Laterality Date  . CARDIOVERSION    . CARDIOVERSION N/A 03/07/2018   Procedure: CARDIOVERSION;  Surgeon: Wellington Hampshire, MD;  Location: [facility] ORS;  Service: Cardiovascular;  Laterality: N/A;  . CARDIOVERSION N/A 04/06/2019   Procedure: CARDIOVERSION;  Surgeon: Wellington Hampshire, MD;  Location: [facility] ORS;  Service: Cardiovascular;  Laterality: N/A;  . COLONOSCOPY WITH PROPOFOL N/A 10/17/2015   Procedure: COLONOSCOPY WITH PROPOFOL;  Surgeon: Manya Silvas, MD;  Location: [facility] ENDOSCOPY;  Service: Endoscopy;  Laterality: N/A;  . COLONOSCOPY WITH PROPOFOL N/A 03/17/2019   Procedure: COLONOSCOPY WITH PROPOFOL;  Surgeon: Toledo, Benay Pike, MD;  Location: [facility] ENDOSCOPY;  Service: Gastroenterology;  Laterality: N/A;  . ELECTROPHYSIOLOGIC STUDY N/A 12/07/2015   Procedure: V Tach Ablation;  Surgeon: Will Joos Leeds, MD;  Location: [place] CV LAB;  Service: Cardiovascular;  Laterality: N/A;  . JOINT REPLACEMENT     R TKR  . RADIOLOGY WITH ANESTHESIA N/A 05/18/2014   Procedure: RADIOLOGY WITH ANESTHESIA;  Surgeon: Rob Hickman, MD;  Location: [place];  Service: Radiology;  Laterality: N/A;  . REPLACEMENT TOTAL KNEE    . REPLACEMENT TOTAL KNEE Left   . VASECTOMY      Current Outpatient Medications  Medication Sig Dispense Refill  . amiodarone (PACERONE) 200 MG tablet Take 1 tablet (200 mg) by mouth once daily 90 tablet 3  . apixaban (ELIQUIS) 5 MG TABS tablet Take 1 tablet (5 mg total) by mouth 2 (two) times daily. 180 tablet 3  . Ascorbic Acid (VITAMIN C) 500 MG tablet Take 500 mg by mouth daily.      Marland Kitchen atorvastatin (LIPITOR) 10 MG tablet Take 10 mg by mouth every evening.     . carvedilol (COREG) 3.125 MG tablet Take 1 tablet (3.125 mg total) by mouth 2 (two) times daily. 60 tablet 2  . Cholecalciferol (VITAMIN D3) 1000 UNITS CAPS Take 1,000 Units by mouth daily.     Marland Kitchen docusate sodium (COLACE) 50 MG capsule Take 50 mg by mouth 2 (two) times daily.     . finasteride (PROSCAR) 5 MG tablet Take 1 tablet (5 mg total) by mouth daily. 90 tablet 3  . levothyroxine (SYNTHROID) 100 MCG tablet Take 1 tablet (100 mcg total) by mouth daily before breakfast. 30 tablet 0  . mexiletine (MEXITIL) 150 MG capsule Take 1 capsule (150 mg total) by mouth 2 (two) times daily. 180 capsule 3  . Multiple Vitamin (MULTIVITAMIN WITH MINERALS) TABS tablet Take 1 tablet by mouth daily. One A Day 50+    . spironolactone (ALDACTONE) 25 MG tablet Take 1 tablet (25 mg total) by mouth daily. 30 tablet 6  . tamsulosin (FLOMAX) 0.4 MG CAPS capsule Take 2 capsules (0.8 mg total) by mouth daily. 180 capsule 3  . torsemide (DEMADEX) 20 MG tablet Take  10 mg (0.5 tablet) by mount once daily, though if he gains 2 pounds in a day or 5 pounds in a week, he should return to 20 mg daily until his weight returns to baseline. 90 tablet 1   No current facility-administered medications for this visit.     Allergies  Allergen Reactions  . Ciprofloxacin Anaphylaxis  . Levofloxacin Anaphylaxis    "throat closes"  . Enalapril Maleate Swelling    Angioedema  . Metoprolol Swelling    edema  . Vasotec [Enalaprilat] Other (See Comments)    Reaction: Angioedema  . Sulfa Antibiotics Rash and Other (See Comments)    Hypotension    Review of Systems negative except from HPI and PMH  Physical Exam BP 106/70 (BP Location: Left Arm, Patient Position: Sitting, Cuff Size: Normal)   Pulse 75   Ht 5\' 6"  (1.676 m)   Wt 175 lb (79.4 kg)   BMI 28.25 kg/m  Well developed and nourished in no acute distress HENT normal Neck supple with JVP-flat Carotids brisk and full without bruits Clear Irregularly irregular rate and rhythm with controlled ventricular response, no murmurs or gallops Abd-soft with active BS without hepatomegaly No Clubbing cyanosis tr edema Skin-warm and dry A & Oriented  Grossly normal sensory and motor function   ECG atrial fibrillation at  73 Interval-/09/39  Assessment and  Plan  Ventricular tachycardia  Low-voltage ECG  Cardiomyopathy-nonischemic abnormal signal average ECG/MRI negative fat pad biopsy  Persistent atrial fibrillation  Stroke   Sinus bradycardia  Hypothyroidism-iatrogenic-treated  High Risk Medication Surveillance amiodarone      Discussed whether we repeat cardioversion or not.  Given his ongoing breathlessness being at base weight and well rate controlled, we have elected to repeat cardioversion.  In anticipation will increase his amiodarone from 200--400 mg a day anticipate that for about 2 months with a cardioversion in between.  Continue torsemide adjusting as needed for weight.  No interval ventricular tachycardia  No interval bleeding  We spent more than 50% of our >25 min visit in face to face counseling regarding the above

## 2019-05-14 NOTE — Patient Instructions (Signed)
Medication Instructions:  - Your physician has recommended you make the following change in your medication:   1) Increase amiodarone 200 mg- take 1 tablet by mouth twice daily  *If you need a refill on your cardiac medications before your next appointment, please call your pharmacy*  Lab Work:  Pre-procedure lab work:  - Friday 05/29/19 (12:00 pm- 2:00 pm)- BMP/ CBC/ TSH - [facility] entrance, 1st desk on the right to check in   [place] swab: - Friday 05/29/19 (12:30 pm- 2:30 pm) - [facility] entrance, drive up test   If you have labs (blood work) drawn today and your tests are completely normal, you will receive your results only by: Marland Kitchen MyChart Message (if you have MyChart) OR . A paper copy in the mail If you have any lab test that is abnormal or we need to change your treatment, we will call you to review the results.  Testing/Procedures: - Your physician has recommended that you have a Cardioversion (DCCV). Electrical Cardioversion uses a jolt of electricity to your heart either through paddles or wired patches attached to your chest. This is a controlled, usually prescheduled, procedure. Defibrillation is done under light anesthesia in the hospital, and you usually go home the day of the procedure. This is done to get your heart back into a normal rhythm. You are not awake for the procedure. Please see the instruction sheet given to you today.  You are scheduled for a Cardioversion on Tuesday 06/02/19 with Dr. Saunders Revel  Please arrive at the [facility] at 6:30 a.m. on the day of your procedure.  DIET INSTRUCTIONS:  Nothing to eat or drink after midnight the night before your procedure         1) Labs: as above  2) Medications:  You may take all of your regular medications the morning of your procedure with enough water to get them down safely unless listed below:  - Hold demadex (torsemide) the morning of your procedure  3) Must have a responsible  person to drive you home.  4) Bring a current list of your medications and current insurance cards.    If you have any questions after you get home, please call the office at [phone]. Alvis Lemmings, RN, BSN    Follow-Up: At [facility], you and your health needs are our priority.  As part of our continuing mission to provide you with exceptional heart care, we have created designated Provider Care Teams.  These Care Teams include your primary Cardiologist (physician) and Advanced Practice Providers (APPs -  Physician Assistants and Nurse Practitioners) who all work together to provide you with the care you need, when you need it.  Your next appointment:   1-2 weeks (from 11/17) with Dr. Saunders Revel   6 months with Dr. Caryl Comes  The format for your next appointment:   In Person  Provider:   as above  Other Instructions - n/a

## 2019-05-18 ENCOUNTER — Other Ambulatory Visit: Payer: Self-pay | Admitting: Internal Medicine

## 2019-05-18 DIAGNOSIS — I4819 Other persistent atrial fibrillation: Secondary | ICD-10-CM

## 2019-05-21 ENCOUNTER — Telehealth: Payer: Self-pay | Admitting: Internal Medicine

## 2019-05-26 NOTE — Telephone Encounter (Signed)
°*  STAT* If patient is at the pharmacy, call can be transferred to refill team.   1. Which medications need to be refilled? (please list name of each medication and dose if known) levothyroxine 100 MG daily   2. Which pharmacy/location (including street and city if local pharmacy) is medication to be sent to? [facility]   3. Do they need a 30 day or 90 day supply? 90 day

## 2019-05-27 ENCOUNTER — Other Ambulatory Visit: Payer: Self-pay | Admitting: Internal Medicine

## 2019-05-29 ENCOUNTER — Other Ambulatory Visit
Admission: RE | Admit: 2019-05-29 | Discharge: 2019-05-29 | Disposition: A | Discharge status: Home / Self Care | Payer: Medicare Other | Source: Ambulatory Visit | Attending: Internal Medicine | Admitting: Internal Medicine

## 2019-05-29 ENCOUNTER — Other Ambulatory Visit: Payer: Self-pay

## 2019-05-29 DIAGNOSIS — I4819 Other persistent atrial fibrillation: Secondary | ICD-10-CM

## 2019-05-29 DIAGNOSIS — Z20828 Contact with and (suspected) exposure to other viral communicable diseases: Secondary | ICD-10-CM | POA: Diagnosis not present

## 2019-05-29 DIAGNOSIS — I428 Other cardiomyopathies: Secondary | ICD-10-CM | POA: Diagnosis not present

## 2019-05-29 DIAGNOSIS — I5022 Chronic systolic (congestive) heart failure: Secondary | ICD-10-CM | POA: Diagnosis not present

## 2019-05-29 DIAGNOSIS — Z01812 Encounter for preprocedural laboratory examination: Secondary | ICD-10-CM | POA: Diagnosis present

## 2019-05-29 DIAGNOSIS — Z79899 Other long term (current) drug therapy: Secondary | ICD-10-CM | POA: Insufficient documentation

## 2019-05-29 LAB — CBC WITH DIFFERENTIAL/PLATELET
Abs Immature Granulocytes: 0.04 10*3/uL (ref 0.00–0.07)
Basophils Absolute: 0 10*3/uL (ref 0.0–0.1)
Basophils Relative: 1 %
Eosinophils Absolute: 0.2 10*3/uL (ref 0.0–0.5)
Eosinophils Relative: 2 %
HCT: 47 % (ref 39.0–52.0)
Hemoglobin: 16.2 g/dL (ref 13.0–17.0)
Immature Granulocytes: 1 %
Lymphocytes Relative: 22 %
Lymphs Abs: 1.7 10*3/uL (ref 0.7–4.0)
MCH: 32.4 pg (ref 26.0–34.0)
MCHC: 34.5 g/dL (ref 30.0–36.0)
MCV: 94 fL (ref 80.0–100.0)
Monocytes Absolute: 0.8 10*3/uL (ref 0.1–1.0)
Monocytes Relative: 10 %
Neutro Abs: 5 10*3/uL (ref 1.7–7.7)
Neutrophils Relative %: 64 %
Platelets: 141 10*3/uL — ABNORMAL LOW (ref 150–400)
RBC: 5 MIL/uL (ref 4.22–5.81)
RDW: 13.3 % (ref 11.5–15.5)
WBC: 7.7 10*3/uL (ref 4.0–10.5)
nRBC: 0 % (ref 0.0–0.2)

## 2019-05-29 LAB — BASIC METABOLIC PANEL
Anion gap: 11 (ref 5–15)
BUN: 27 mg/dL — ABNORMAL HIGH (ref 8–23)
CO2: 26 mmol/L (ref 22–32)
Calcium: 9.1 mg/dL (ref 8.9–10.3)
Chloride: 98 mmol/L (ref 98–111)
Creatinine, Ser: 1.19 mg/dL (ref 0.61–1.24)
GFR calc Af Amer: >60 mL/min (ref >60)
GFR calc non Af Amer: 57 mL/min — ABNORMAL LOW (ref >60)
Glucose, Bld: 114 mg/dL — ABNORMAL HIGH (ref 70–99)
Potassium: 4.7 mmol/L (ref 3.5–5.1)
Sodium: 135 mmol/L (ref 135–145)

## 2019-05-29 LAB — TSH: TSH: 4.905 u[IU]/mL — ABNORMAL HIGH (ref 0.350–4.500)

## 2019-05-30 LAB — SARS CORONAVIRUS 2 (TAT 6-24 HRS): SARS Coronavirus 2: NEGATIVE

## 2019-06-01 ENCOUNTER — Other Ambulatory Visit: Payer: Self-pay | Admitting: *Deleted

## 2019-06-01 MED ORDER — LEVOTHYROXINE SODIUM 100 MCG PO TABS: 100.0000 ug | ORAL_TABLET | Freq: Every day | ORAL | 2 refills | Status: DC

## 2019-06-02 ENCOUNTER — Ambulatory Visit
Admission: RE | Admit: 2019-06-02 | Discharge: 2019-06-02 | Disposition: A | Discharge status: Home / Self Care | Payer: Medicare Other | Attending: Internal Medicine | Admitting: Internal Medicine

## 2019-06-02 ENCOUNTER — Ambulatory Visit: Payer: Medicare Other | Admitting: Certified Registered Nurse Anesthetist

## 2019-06-02 ENCOUNTER — Other Ambulatory Visit: Payer: Self-pay

## 2019-06-02 ENCOUNTER — Encounter
Admission: RE | Disposition: A | Discharge status: Home / Self Care | Payer: Self-pay | Source: Home / Self Care | Attending: Internal Medicine

## 2019-06-02 DIAGNOSIS — Z79899 Other long term (current) drug therapy: Secondary | ICD-10-CM | POA: Diagnosis not present

## 2019-06-02 DIAGNOSIS — R200 Anesthesia of skin: Secondary | ICD-10-CM | POA: Diagnosis not present

## 2019-06-02 DIAGNOSIS — I428 Other cardiomyopathies: Secondary | ICD-10-CM | POA: Insufficient documentation

## 2019-06-02 DIAGNOSIS — I4819 Other persistent atrial fibrillation: Secondary | ICD-10-CM | POA: Insufficient documentation

## 2019-06-02 DIAGNOSIS — E032 Hypothyroidism due to medicaments and other exogenous substances: Secondary | ICD-10-CM | POA: Insufficient documentation

## 2019-06-02 DIAGNOSIS — E119 Type 2 diabetes mellitus without complications: Secondary | ICD-10-CM | POA: Diagnosis not present

## 2019-06-02 DIAGNOSIS — N400 Enlarged prostate without lower urinary tract symptoms: Secondary | ICD-10-CM | POA: Diagnosis not present

## 2019-06-02 DIAGNOSIS — I472 Ventricular tachycardia: Secondary | ICD-10-CM | POA: Diagnosis not present

## 2019-06-02 DIAGNOSIS — I110 Hypertensive heart disease with heart failure: Secondary | ICD-10-CM | POA: Diagnosis not present

## 2019-06-02 DIAGNOSIS — Z96653 Presence of artificial knee joint, bilateral: Secondary | ICD-10-CM | POA: Insufficient documentation

## 2019-06-02 DIAGNOSIS — I509 Heart failure, unspecified: Secondary | ICD-10-CM | POA: Insufficient documentation

## 2019-06-02 DIAGNOSIS — I69319 Unspecified symptoms and signs involving cognitive functions following cerebral infarction: Secondary | ICD-10-CM | POA: Diagnosis not present

## 2019-06-02 DIAGNOSIS — Z79890 Hormone replacement therapy (postmenopausal): Secondary | ICD-10-CM | POA: Diagnosis not present

## 2019-06-02 DIAGNOSIS — I69398 Other sequelae of cerebral infarction: Secondary | ICD-10-CM | POA: Diagnosis not present

## 2019-06-02 DIAGNOSIS — I69320 Aphasia following cerebral infarction: Secondary | ICD-10-CM | POA: Insufficient documentation

## 2019-06-02 DIAGNOSIS — E785 Hyperlipidemia, unspecified: Secondary | ICD-10-CM | POA: Diagnosis not present

## 2019-06-02 DIAGNOSIS — Z7901 Long term (current) use of anticoagulants: Secondary | ICD-10-CM | POA: Insufficient documentation

## 2019-06-02 DIAGNOSIS — Z85820 Personal history of malignant melanoma of skin: Secondary | ICD-10-CM | POA: Diagnosis not present

## 2019-06-02 HISTORY — PX: CARDIOVERSION: SHX1299

## 2019-06-02 SURGERY — CARDIOVERSION
Anesthesia: General

## 2019-06-02 MED ORDER — PROPOFOL 10 MG/ML IV BOLUS
INTRAVENOUS | Status: DC | PRN
  Administered 2019-06-02 (×2): 20 mg via INTRAVENOUS
  Administered 2019-06-02: 30 mg via INTRAVENOUS
  Administered 2019-06-02: 20 mg via INTRAVENOUS

## 2019-06-02 MED ORDER — SODIUM CHLORIDE 0.9 % IV SOLN
INTRAVENOUS | Status: DC
  Administered 2019-06-02: 1000 mL via INTRAVENOUS

## 2019-06-02 MED ORDER — PROPOFOL 500 MG/50ML IV EMUL
INTRAVENOUS | Status: AC
  Filled 2019-06-02: qty 50

## 2019-06-02 NOTE — Anesthesia Post-op Follow-up Note (Signed)
Anesthesia QCDR form completed.        

## 2019-06-02 NOTE — Transfer of Care (Signed)
Immediate Anesthesia Transfer of Care Note  Patient: Austin Warren  Procedure(s) Performed: CARDIOVERSION (N/A )  Patient Location: PACU  Anesthesia Type:General  Level of Consciousness: sedated  Airway & Oxygen Therapy: Patient Spontanous Breathing and Patient connected to nasal cannula oxygen  Post-op Assessment: Report given to RN and Post -op Vital signs reviewed and stable  Post vital signs: Reviewed and stable  Last Vitals:  Vitals Value Taken Time  BP 103/67 06/02/19 0739  Temp    Pulse 69 06/02/19 0741  Resp 16 06/02/19 0741  SpO2 93 % 06/02/19 0741    Last Pain:  Vitals:   06/02/19 0705  TempSrc: Oral  PainSc: 0-No pain         Complications: No apparent anesthesia complications

## 2019-06-02 NOTE — Interval H&P Note (Signed)
History and Physical Interval Note:  06/02/2019 7:14 AM  Austin Warren  has presented today for surgery, with the diagnosis of atrial fibrillation/flutter.  The various methods of treatment have been discussed with the patient and family. After consideration of risks, benefits and other options for treatment, the patient has consented to  Procedure(s): CARDIOVERSION (N/A) as a surgical intervention.  The patient's history has been reviewed, patient examined, no change in status, stable for surgery.  I have reviewed the patient's chart and labs.  Questions were answered to the patient's satisfaction.     [name]

## 2019-06-02 NOTE — CV Procedure (Signed)
    Cardioversion Note  Austin Warren [id number] [id number]  Procedure: DC Cardioversion Indications: Atrial fibrillation  Procedure Details Consent: Obtained Time Out: Verified patient identification, verified procedure, site/side was marked, verified correct patient position, special equipment/implants available, Radiology Safety Procedures followed,  medications/allergies/relevent history reviewed, required imaging and test results available.  Performed  The patient has been on adequate anticoagulation.  The patient received IV propofol by anesthesia for sedation.  Synchronous cardioversion was performed at 120 joules x 1.  The cardioversion was successful with restoration of normal sinus rhythm.  Complications: No apparent complications Patient did tolerate procedure well.  Nelva Bush., MD 06/02/2019, 7:39 AM

## 2019-06-02 NOTE — Anesthesia Preprocedure Evaluation (Signed)
Anesthesia Evaluation  Patient identified by MRN, date of birth, ID band Patient awake    Reviewed: Allergy & Precautions, NPO status , Patient's Chart, lab work & pertinent test results  History of Anesthesia Complications Negative for: history of anesthetic complications  Airway Mallampati: III  TM Distance: <3 FB Neck ROM: limited    Dental  (+) Poor Dentition, Chipped, Missing   Pulmonary neg sleep apnea, neg COPD, former smoker,           Cardiovascular hypertension, Pt. on medications +CHF (EF 40-45%)  (-) CAD, (-) Past MI, (-) Cardiac Stents and (-) CABG + dysrhythmias Atrial Fibrillation  - Systolic murmurs and - Diastolic murmurs    Neuro/Psych neg Seizures TIACVA (R sided numbness), Residual Symptoms negative psych ROS   GI/Hepatic negative GI ROS, Neg liver ROS,   Endo/Other  diabetes  Renal/GU Renal disease     Musculoskeletal  (+) Arthritis ,   Abdominal (+) - obese,   Peds  Hematology negative hematology ROS (+)   Anesthesia Other Findings Past Medical History: No date: Arthritis No date: Benign prostatic hypertrophy No date: Bursitis of left shoulder No date: CHF (congestive heart failure) (HCC) No date: Diabetes ([place]) 11/01/2011: Dyspnea on exertion No date: Elevated PSA No date: History of colon polyps No date: History of colon polyps No date: Hyperlipidemia No date: Hypertension No date: Melanoma ([place])     Comment:  under arm No date: Microscopic hematuria No date: Osteoarthritis No date: Retroperitoneal mass 05/2014: Stroke ([place]) No date: TIA (transient ischemic attack) No date: Urinary retention No date: UTI (urinary tract infection) No date: Ventricular tachycardia (HCC)     Comment:  RBB/LAHB ideopathic VT, 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   Reproductive/Obstetrics                             Anesthesia Physical  Anesthesia Plan  ASA: IV  Anesthesia  Plan: General   Post-op Pain Management:    Induction: Intravenous  PONV Risk Score and Plan: 1 and Propofol infusion  Airway Management Planned: Natural Airway  Additional Equipment:   Intra-op Plan:   Post-operative Plan:   Informed Consent: I have reviewed the patients History and Physical, chart, labs and discussed the procedure including the risks, benefits and alternatives for the proposed anesthesia with the patient or authorized representative who has indicated his/her understanding and acceptance.     Dental advisory given  Plan Discussed with: CRNA and Anesthesiologist  Anesthesia Plan Comments: (Patient consented for risks of anesthesia including but not limited to:  - adverse reactions to medications - risk of intubation if required - damage to teeth, lips or other oral mucosa - sore throat or hoarseness - Damage to heart, brain, lungs or loss of life  Patient voiced understanding.)        Anesthesia Quick Evaluation

## 2019-06-02 NOTE — Anesthesia Procedure Notes (Signed)
Date/Time: 06/02/2019 7:30 AM Performed by: Johnna Acosta, CRNA Pre-anesthesia Checklist: Patient identified, Emergency Drugs available, Patient being monitored, Suction available and Timeout performed Patient Re-evaluated:Patient Re-evaluated prior to induction Oxygen Delivery Method: Nasal cannula Preoxygenation: Pre-oxygenation with 100% oxygen Induction Type: IV induction

## 2019-06-02 NOTE — Anesthesia Postprocedure Evaluation (Signed)
Anesthesia Post Note  Patient: Austin Warren  Procedure(s) Performed: CARDIOVERSION (N/A )  Patient location during evaluation: Endoscopy Anesthesia Type: General Level of consciousness: awake and alert Pain management: pain level controlled Vital Signs Assessment: post-procedure vital signs reviewed and stable Respiratory status: spontaneous breathing, nonlabored ventilation, respiratory function stable and patient connected to nasal cannula oxygen Cardiovascular status: blood pressure returned to baseline and stable Postop Assessment: no apparent nausea or vomiting Anesthetic complications: no     Last Vitals:  Vitals:   06/02/19 0800 06/02/19 0815  BP: (!) 100/58 115/80  Pulse: 60 68  Resp: 14   Temp:    SpO2: 95% 98%    Last Pain:  Vitals:   06/02/19 0815  TempSrc:   PainSc: 0-No pain                 [name]

## 2019-06-03 ENCOUNTER — Encounter: Payer: Self-pay | Admitting: Internal Medicine

## 2019-06-04 ENCOUNTER — Telehealth: Payer: Self-pay | Admitting: *Deleted

## 2019-06-04 NOTE — Telephone Encounter (Signed)
-----   Message from Deboraha Sprang, MD sent at 06/04/2019 12:40 PM EST ----- Started about 3 weeks ago, lets have him resume normal amiodarone dose next Tuesday Thanks SK    ----- Message ----- From: Emily Filbert, RN Sent: 06/04/2019  11:30 AM EST To: Deboraha Sprang, MD, Emily Filbert, RN  Dr. Caryl Comes- any thoughts on his amiodarone dosing. He had a DCCV on 11/17 and was on amiodarone 200 mg BID prior. How long does he need to continue the 200 mg BID dose?  ----- Message ----- From: Nelva Bush, MD Sent: 06/04/2019  10:28 AM EST To: Emily Filbert, RN  I did not make any changes to his amiodarone post cardioversion.  I will defer any changes to amiodarone to Dr. Caryl Comes.  Thanks.  Gerald Stabs ----- Message ----- From: Emily Filbert, RN Sent: 06/04/2019  10:20 AM EST To: Nelva Bush, MD  Hey Dr. Saunders Revel,  Did you do anything different with Mr. Catino amio dose post DCCV on 11/17? He was taking 200 mg BID. His wife asked me the other day how long he would stay at that dose and I advised her I was not sure, but I would follow up post DCCV- not sure is you advised anything different at d/c. He is scheduled to see you on 12/2.  Or I can run it by Dr. Caryl Comes- just needed to know what direction to go.  Thanks!

## 2019-06-04 NOTE — Telephone Encounter (Signed)
Attempted to call the patient. I left a message to please call back.  

## 2019-06-05 ENCOUNTER — Ambulatory Visit: Payer: Medicare Other | Admitting: Internal Medicine

## 2019-06-05 MED ORDER — AMIODARONE HCL 200 MG PO TABS: ORAL_TABLET | ORAL | Status: DC

## 2019-06-05 NOTE — Telephone Encounter (Signed)
I spoke with the patient's wife.  She is aware of Dr. Olin Pia recommendation to continue amiodarone 200 mg BID until Tuesday 06/09/19, then decrease amiodarone to 200 mg once daily. Mrs. Mcglone voices understanding of the above recommendations and is agreeable.   They will keep the patient's follow up with Dr. Saunders Revel as scheduled on 06/17/19.

## 2019-06-08 ENCOUNTER — Other Ambulatory Visit: Payer: Self-pay | Admitting: Internal Medicine

## 2019-06-17 ENCOUNTER — Ambulatory Visit (INDEPENDENT_AMBULATORY_CARE_PROVIDER_SITE_OTHER): Payer: Medicare Other | Admitting: Internal Medicine

## 2019-06-17 ENCOUNTER — Encounter: Payer: Self-pay | Admitting: Internal Medicine

## 2019-06-17 ENCOUNTER — Other Ambulatory Visit: Payer: Self-pay

## 2019-06-17 VITALS — BP 120/88 | HR 78 | Ht 66.0 in | Wt 174.0 lb

## 2019-06-17 DIAGNOSIS — I4892 Unspecified atrial flutter: Secondary | ICD-10-CM

## 2019-06-17 DIAGNOSIS — I5022 Chronic systolic (congestive) heart failure: Secondary | ICD-10-CM | POA: Diagnosis not present

## 2019-06-17 DIAGNOSIS — Z8679 Personal history of other diseases of the circulatory system: Secondary | ICD-10-CM

## 2019-06-17 DIAGNOSIS — E039 Hypothyroidism, unspecified: Secondary | ICD-10-CM | POA: Diagnosis not present

## 2019-06-17 MED ORDER — TORSEMIDE 10 MG PO TABS: ORAL_TABLET | ORAL | 2 refills | Status: DC

## 2019-06-17 NOTE — Patient Instructions (Signed)
Medication Instructions:  Your physician recommends that you continue on your current medications as directed. Please refer to the Current Medication list given to you today.  *If you need a refill on your cardiac medications before your next appointment, please call your pharmacy*  Lab Work: none If you have labs (blood work) drawn today and your tests are completely normal, you will receive your results only by: Marland Kitchen MyChart Message (if you have MyChart) OR . A paper copy in the mail If you have any lab test that is abnormal or we need to change your treatment, we will call you to review the results.  Testing/Procedures: Your physician has requested that you have an LIMITED echocardiogram IN 2 MONTHS ON SAME DAY AS FOLLOW UP appointment IF POSSIBLE. Echocardiography is a painless test that uses sound waves to create images of your heart. It provides your doctor with information about the size and shape of your heart and how well your heart's chambers and valves are working. This procedure takes approximately one hour. There are no restrictions for this procedure.   Follow-Up: At [facility], you and your health needs are our priority.  As part of our continuing mission to provide you with exceptional heart care, we have created designated Provider Care Teams.  These Care Teams include your primary Cardiologist (physician) and Advanced Practice Providers (APPs -  Physician Assistants and Nurse Practitioners) who all work together to provide you with the care you need, when you need it.  Your next appointment:   2 month(s) WITH LIMITED ECHO PRIOR TO APPT IF POSSIBLE.   The format for your next appointment:   In Person  Provider:    You may see DR Harrell Gave END or one of the following Advanced Practice Providers on your designated Care Team:    Murray Hodgkins, NP  Christell Faith, PA-C  Marrianne Mood, PA-C

## 2019-06-17 NOTE — Progress Notes (Signed)
Follow-up Outpatient Visit Date: 06/17/2019  Primary Care Provider: Kirk Ruths, MD [facility] [place] [address]  Chief Complaint: Follow-up atrial fibrillation and heart failure  HPI:  Mr. Mikeal is a 78 y.o. male with history of persistent atrial fibrillation/flutter, recurrent ventricular tachycardia, chronic systolic heart failure due to nonischemic cardiomyopathy, hypertension, hyperlipidemia, stroke, and hypothyroidism, who presents for follow-up of cardiomyopathy and atrial fibrillation.  I last saw Mr. Clement in mid October, at which time he reported feeling better than at prior visits.  He still reported some exertional dyspnea.  He was subsequently seen by Dr. Caryl Comes in late October, at which time amiodarone was increased to 400 mg daily.  Mr. Nardolillo underwent successful cardioversion last month.  Today, Mr. Nartker reports feeling about the same as at our last visit.  He notes occasional lightheadedness.  His wife has also observed resting heart rates in to the 40's and 50's.  He denies chest pain, shortness of breath, and palpitations, though he is fairly sedentary.  Mr. Mcvoy has remained compliant with his medications.  He denies bleeding.  He notes that his sense of taste and smell has continued to worsen; this began at the time of his stroke.  --------------------------------------------------------------------------------------------------  Past Medical History:  Diagnosis Date  . Arthritis   . Benign prostatic hypertrophy   . Bursitis of left shoulder   . CHF (congestive heart failure) ([place])   . Diabetes ([place])   . Dyspnea on exertion 11/01/2011  . Elevated PSA   . History of colon polyps   . History of colon polyps   . Hyperlipidemia   . Hypertension   . Melanoma ([place])    under arm  . Microscopic hematuria   . Osteoarthritis   . Retroperitoneal mass   . Stroke ([place]) 05/2014  . TIA (transient ischemic attack)    . Urinary retention   . UTI (urinary tract infection)   . Ventricular tachycardia (HCC)    RBB/LAHB ideopathic VT, 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   Past Surgical History:  Procedure Laterality Date  . CARDIOVERSION    . CARDIOVERSION N/A 03/07/2018   Procedure: CARDIOVERSION;  Surgeon: Wellington Hampshire, MD;  Location: [facility] ORS;  Service: Cardiovascular;  Laterality: N/A;  . CARDIOVERSION N/A 04/06/2019   Procedure: CARDIOVERSION;  Surgeon: Wellington Hampshire, MD;  Location: [place] ORS;  Service: Cardiovascular;  Laterality: N/A;  . CARDIOVERSION N/A 06/02/2019   Procedure: CARDIOVERSION;  Surgeon: Nelva Bush, MD;  Location: [facility] ORS;  Service: Cardiovascular;  Laterality: N/A;  . COLONOSCOPY WITH PROPOFOL N/A 10/17/2015   Procedure: COLONOSCOPY WITH PROPOFOL;  Surgeon: Manya Silvas, MD;  Location[facility] ENDOSCOPY;  Service: Endoscopy;  Laterality: N/A;  . COLONOSCOPY WITH PROPOFOL N/A 03/17/2019   Procedure: COLONOSCOPY WITH PROPOFOL;  Surgeon: Toledo, Benay Pike, MD;  Location: [facility] ENDOSCOPY;  Service: Gastroenterology;  Laterality: N/A;  . ELECTROPHYSIOLOGIC STUDY N/A 12/07/2015   Procedure: V Tach Ablation;  Surgeon: Will Breslin Leeds, MD;  Location: [place] CV LAB;  Service: Cardiovascular;  Laterality: N/A;  . JOINT REPLACEMENT     R TKR  . RADIOLOGY WITH ANESTHESIA N/A 05/18/2014   Procedure: RADIOLOGY WITH ANESTHESIA;  Surgeon: Rob Hickman, MD;  Location: [place];  Service: Radiology;  Laterality: N/A;  . REPLACEMENT TOTAL KNEE    . REPLACEMENT TOTAL KNEE Left   . VASECTOMY      Current Meds  Medication Sig  . amiodarone (PACERONE) 200 MG tablet  Take 1 tablet (200 mg) by mouth once daily  . apixaban (ELIQUIS) 5 MG TABS tablet Take 1 tablet (5 mg total) by mouth 2 (two) times daily.  . Ascorbic Acid (VITAMIN C) 500 MG tablet Take 500 mg by mouth daily.    Marland Kitchen atorvastatin (LIPITOR) 10 MG tablet Take 10 mg by mouth every evening.   . carvedilol (COREG) 3.125 MG tablet  TAKE ONE TABLET TWICE DAILY  . Cholecalciferol (VITAMIN D3) 1000 UNITS CAPS Take 1,000 Units by mouth daily.   Marland Kitchen docusate sodium (COLACE) 50 MG capsule Take 50 mg by mouth 2 (two) times daily.  . finasteride (PROSCAR) 5 MG tablet Take 1 tablet (5 mg total) by mouth daily.  Marland Kitchen levothyroxine (SYNTHROID) 100 MCG tablet Take 1 tablet (100 mcg total) by mouth daily before breakfast.  . mexiletine (MEXITIL) 150 MG capsule Take 1 capsule (150 mg total) by mouth 2 (two) times daily.  . Multiple Vitamin (MULTIVITAMIN WITH MINERALS) TABS tablet Take 1 tablet by mouth daily. One A Day 50+  . spironolactone (ALDACTONE) 25 MG tablet Take 1 tablet (25 mg total) by mouth daily.  . tamsulosin (FLOMAX) 0.4 MG CAPS capsule Take 2 capsules (0.8 mg total) by mouth daily.  Marland Kitchen torsemide (DEMADEX) 10 MG tablet Take 10 mg (1 tablet) by mount once daily, though if he gains 2 pounds in a day or 5 pounds in a week, he should return to 20 mg daily until his weight returns to baseline.  . [DISCONTINUED] torsemide (DEMADEX) 20 MG tablet Take 10 mg (0.5 tablet) by mount once daily, though if he gains 2 pounds in a day or 5 pounds in a week, he should return to 20 mg daily until his weight returns to baseline. (Patient taking differently: Take 10-20 mg by mouth See admin instructions. Take 10 mg (0.5 tablet) by mount once daily, though if he gains 2 pounds in a day or 5 pounds in a week, he should return to 20 mg daily until his weight returns to baseline.)    Allergies: Ciprofloxacin, Levofloxacin, Enalapril maleate, Metoprolol, and Sulfa antibiotics  Social History   Tobacco Use  . Smoking status: Former Smoker    Packs/day: 2.00    Years: 10.00    Pack years: 20.00    Types: Cigarettes    Start date: 02/10/1960    Quit date: 02/09/1970    Years since quitting: 49.3  . Smokeless tobacco: Never Used  . Tobacco comment: 05/2014    quit smoking  45 years ago  Substance Use Topics  . Alcohol use: Yes    Comment: 3-4 beer a  week  . Drug use: No    Family History  Problem Relation Age of Onset  . Stroke Father   . Hypertension Father   . Heart attack Neg Hx   . Kidney disease Neg Hx   . Prostate cancer Neg Hx   . Kidney cancer Neg Hx   . Bladder Cancer Neg Hx     Review of Systems: A 12-system review of systems was performed and was negative except as noted in the HPI.  --------------------------------------------------------------------------------------------------  Physical Exam: BP 120/88 (BP Location: Left Arm, Patient Position: Sitting, Cuff Size: Normal)   Pulse 78   Ht 5\' 6"  (1.676 m)   Wt 174 lb (78.9 kg)   SpO2 98%   BMI 28.08 kg/m   General:  NAD.  Accompanied by his wife. HEENT: No conjunctival pallor or scleral icterus. Facemask in place. Neck: Supple  without lymphadenopathy, thyromegaly, JVD, or HJR. Lungs: Normal work of breathing. Clear to auscultation bilaterally without wheezes or crackles. Heart: Regular rate and rhythm without murmurs, rubs, or gallops. Non-displaced PMI. Abd: Bowel sounds present. Soft, NT/ND without hepatosplenomegaly Ext: Trace pretibial edema bilaterally. Skin: Warm and dry without rash.  EKG:  NSR with 1st degree AV block (PR 246).  Low voltage.  Otherwise, no significant abnormality.  Lab Results  Component Value Date   WBC 7.7 05/29/2019   HGB 16.2 05/29/2019   HCT 47.0 05/29/2019   MCV 94.0 05/29/2019   PLT 141 (L) 05/29/2019    Lab Results  Component Value Date   NA 135 05/29/2019   K 4.7 05/29/2019   CL 98 05/29/2019   CO2 26 05/29/2019   BUN 27 (H) 05/29/2019   CREATININE 1.19 05/29/2019   GLUCOSE 114 (H) 05/29/2019   ALT 23 04/07/2019    Lab Results  Component Value Date   CHOL 124 05/19/2014   HDL 38 (L) 05/19/2014   LDLCALC 58 05/19/2014   TRIG 142 05/19/2014   CHOLHDL 3.3 05/19/2014    --------------------------------------------------------------------------------------------------  ASSESSMENT AND PLAN: Chronic  systolic heart failure: Mr. Scherer reports stable NYHA class II-III HF symptoms.  Weight and swelling are stable.  We will continue current medications; I am reluctant to escalate carvedilol further given intermittent lightheadedness and report of resting bradycardia at home.  We will defer adding ARB given history of angioedema with ACEI.  We will plan to repeat a limited echo at the time of our next follow-up visit in ~2 months.  If LVEF remains severely depressed, we will need to consider repeating LHC to ensure that CAD has not developed since prior coronary angiogram in 2012 (no significant CAD at that time).  Atrial flutter: Mr. Massaquoi is maintaining sinus rhythm following most recent DCCV last month.  We will continue current regimen of amiodarone 200 mg daily and apixaban 5 mg BID.  History of ventricular tachycardia: No evidence of recurrence.  Continue amiodarone and mexiletine as well as EP follow-up with Dr. Caryl Comes.  Hypothyroidism: Thought to be at least partially due to amiodarone use.  Most recent TSH last month remained mildly elevated.  I will reach out to Dr. Caryl Comes to see if he was able to discuss this with Dr. Ouida Sills.  Follow-up: Return to clinic in 2 months.  Nelva Bush, MD 06/18/2019 2:01 PM

## 2019-06-18 ENCOUNTER — Encounter: Payer: Self-pay | Admitting: Internal Medicine

## 2019-06-18 DIAGNOSIS — E039 Hypothyroidism, unspecified: Secondary | ICD-10-CM | POA: Insufficient documentation

## 2019-06-18 DIAGNOSIS — I4892 Unspecified atrial flutter: Secondary | ICD-10-CM | POA: Insufficient documentation

## 2019-08-07 ENCOUNTER — Ambulatory Visit: Payer: Medicare Other | Attending: Internal Medicine

## 2019-08-07 DIAGNOSIS — Z23 Encounter for immunization: Secondary | ICD-10-CM

## 2019-08-07 NOTE — Progress Notes (Signed)
   Covid-19 Vaccination Clinic  Name:  Austin Warren    MRN: [id number] DOB: 1941/04/11  08/07/2019  [name] was observed post Covid-19 immunization for 15 minutes without incidence. He was provided with Vaccine Information Sheet and instruction to access the V-Safe system.   [name] was instructed to call 911 with any severe reactions post vaccine: Marland Kitchen Difficulty breathing  . Swelling of your face and throat  . A fast heartbeat  . A bad rash all over your body  . Dizziness and weakness    Immunizations Administered    Name Date Dose VIS Date Route   Pfizer COVID-19 Vaccine 08/07/2019 11:17 AM 0.3 mL 06/26/2019 Intramuscular   Manufacturer: [place]   Lot: [id number]   [place]: [id number]

## 2019-08-13 ENCOUNTER — Ambulatory Visit: Payer: Medicare Other

## 2019-08-17 ENCOUNTER — Other Ambulatory Visit: Payer: Self-pay | Admitting: Internal Medicine

## 2019-08-17 DIAGNOSIS — I2510 Atherosclerotic heart disease of native coronary artery without angina pectoris: Secondary | ICD-10-CM

## 2019-08-17 HISTORY — DX: Atherosclerotic heart disease of native coronary artery without angina pectoris: I25.10

## 2019-08-17 NOTE — Telephone Encounter (Signed)
Please advise if ok to refill Levothyroxine 100 mcg qd. Last filled by Caryl Comes.

## 2019-08-19 ENCOUNTER — Ambulatory Visit (INDEPENDENT_AMBULATORY_CARE_PROVIDER_SITE_OTHER): Payer: Medicare Other

## 2019-08-19 ENCOUNTER — Encounter: Payer: Self-pay | Admitting: Internal Medicine

## 2019-08-19 ENCOUNTER — Ambulatory Visit (INDEPENDENT_AMBULATORY_CARE_PROVIDER_SITE_OTHER): Payer: Medicare Other | Admitting: Internal Medicine

## 2019-08-19 ENCOUNTER — Other Ambulatory Visit: Payer: Self-pay

## 2019-08-19 VITALS — BP 110/52 | HR 48 | Ht 66.0 in | Wt 178.5 lb

## 2019-08-19 DIAGNOSIS — Z8679 Personal history of other diseases of the circulatory system: Secondary | ICD-10-CM | POA: Diagnosis not present

## 2019-08-19 DIAGNOSIS — I5022 Chronic systolic (congestive) heart failure: Secondary | ICD-10-CM | POA: Diagnosis not present

## 2019-08-19 DIAGNOSIS — Z01810 Encounter for preprocedural cardiovascular examination: Secondary | ICD-10-CM

## 2019-08-19 DIAGNOSIS — I428 Other cardiomyopathies: Secondary | ICD-10-CM

## 2019-08-19 DIAGNOSIS — I4892 Unspecified atrial flutter: Secondary | ICD-10-CM | POA: Diagnosis not present

## 2019-08-19 DIAGNOSIS — E039 Hypothyroidism, unspecified: Secondary | ICD-10-CM | POA: Diagnosis not present

## 2019-08-19 MED ORDER — LEVOTHYROXINE SODIUM 100 MCG PO TABS: 100.0000 ug | ORAL_TABLET | Freq: Every day | ORAL | 2 refills | Status: AC

## 2019-08-19 NOTE — Patient Instructions (Addendum)
Medication Instructions:  Your physician recommends that you continue on your current medications as directed. Please refer to the Current Medication list given to you today.  *If you need a refill on your cardiac medications before your next appointment, please call your pharmacy*  Lab Work: Your physician recommends that you return for lab work in: [place].  If you have labs (blood work) drawn today and your tests are completely normal, you will receive your results only by: Marland Kitchen MyChart Message (if you have MyChart) OR . A paper copy in the mail If you have any lab test that is abnormal or we need to change your treatment, we will call you to review the results.  Testing/Procedures: Your physician has requested that you have cardiac CT. Cardiac computed tomography (CT) is a painless test that uses an x-ray machine to take clear, detailed pictures of your heart. For further information please visit [email or web address]. Please follow instruction sheet as given.   Your cardiac CT will be scheduled at one of the below locations:   [facility] [address] [place], [place] [address] [phone]  [facility] [address] [place], [place] [address] [phone]  If scheduled at [facility], please arrive at the [facility] main entrance of [facility] 30 minutes prior to test start time. Proceed to the [facility] Radiology Department (first floor) to check-in and test prep.  If scheduled at [facility], please arrive 15 mins early for check-in and test prep.  Please follow these instructions carefully (unless otherwise directed):  Hold all erectile dysfunction medications at least 3 days (72 hrs) prior to test.  On the Night Before the Test: . Be sure to Drink plenty of water. . Do not consume any caffeinated/decaffeinated beverages or chocolate 12 hours prior to your  test. . Do not take any antihistamines 12 hours prior to your test.  On the Day of the Test: . Drink plenty of water. Do not drink any water within one hour of the test. . Do not eat any food 4 hours prior to the test. . You may take your regular medications prior to the test.  . HOLD Furosemide/Hydrochlorothiazide morning of the test.      After the Test: . Drink plenty of water. . After receiving IV contrast, you may experience a mild flushed feeling. This is normal. . On occasion, you may experience a mild rash up to 24 hours after the test. This is not dangerous. If this occurs, you can take Benadryl 25 mg and increase your fluid intake. . If you experience trouble breathing, this can be serious. If it is severe call 911 IMMEDIATELY. If it is mild, please call our office. . If you take any of these medications: Glipizide/Metformin, Avandament, Glucavance, please do not take 48 hours after completing test unless otherwise instructed.   Once we have confirmed authorization from your insurance company, we will call you to set up a date and time for your test.   For non-scheduling related questions, please contact the cardiac imaging nurse navigator should you have any questions/concerns: Marchia Bond, RN Navigator Cardiac Imaging [facility] [phone] mobile    Follow-Up: At [facility], you and your health needs are our priority.  As part of our continuing mission to provide you with exceptional heart care, we have created designated Provider Care Teams.  These Care Teams include your primary  Cardiologist (physician) and Advanced Practice Providers (APPs -  Physician Assistants and Nurse Practitioners) who all work together to provide you with the care you need, when you need it.  Your next appointment:   6-8 week(s)  The format for your next appointment:   In Person  Provider:    You may see DR Harrell Gave END or one of the following Advanced  Practice Providers on your designated Care Team:    Murray Hodgkins, NP  Christell Faith, PA-C  Marrianne Mood, PA-C     Cardiac CT Angiogram A cardiac CT angiogram is a procedure to look at the heart and the area around the heart. It may be done to help find the cause of chest pains or other symptoms of heart disease. During this procedure, a substance called contrast dye is injected into the blood vessels in the area to be checked. A large X-ray machine, called a CT scanner, then takes detailed pictures of the heart and the surrounding area. The procedure is also sometimes called a coronary CT angiogram, coronary artery scanning, or CTA. A cardiac CT angiogram allows the health care provider to see how well blood is flowing to and from the heart. The health care provider will be able to see if there are any problems, such as:  Blockage or narrowing of the coronary arteries in the heart.  Fluid around the heart.  Signs of weakness or disease in the muscles, valves, and tissues of the heart. Tell a health care provider about:  Any allergies you have. This is especially important if you have had a previous allergic reaction to contrast dye.  All medicines you are taking, including vitamins, herbs, eye drops, creams, and over-the-counter medicines.  Any blood disorders you have.  Any surgeries you have had.  Any medical conditions you have.  Whether you are pregnant or may be pregnant.  Any anxiety disorders, chronic pain, or other conditions you have that may increase your stress or prevent you from lying still. What are the risks? Generally, this is a safe procedure. However, problems may occur, including:  Bleeding.  Infection.  Allergic reactions to medicines or dyes.  Damage to other structures or organs.  Kidney damage from the contrast dye that is used.  Increased risk of cancer from radiation exposure. This risk is low. Talk with your health care provider about: ?  The risks and benefits of testing. ? How you can receive the lowest dose of radiation. What happens before the procedure?  Wear comfortable clothing and remove any jewelry, glasses, dentures, and hearing aids.  Follow instructions from your health care provider about eating and drinking. This may include: ? For 12 hours before the procedure - avoid caffeine. This includes tea, coffee, soda, energy drinks, and diet pills. Drink plenty of water or other fluids that do not have caffeine in them. Being well hydrated can prevent complications. ? For 4-6 hours before the procedure - stop eating and drinking. The contrast dye can cause nausea, but this is less likely if your stomach is empty.  Ask your health care provider about changing or stopping your regular medicines. This is especially important if you are taking diabetes medicines, blood thinners, or medicines to treat problems with erections (erectile dysfunction). What happens during the procedure?   Hair on your chest may need to be removed so that small sticky patches called electrodes can be placed on your chest. These will transmit information that helps to monitor your heart during the procedure.  An IV will be inserted into one of your veins.  You might be given a medicine to control your heart rate during the procedure. This will help to ensure that good images are obtained.  You will be asked to lie on an exam table. This table will slide in and out of the CT machine during the procedure.  Contrast dye will be injected into the IV. You might feel warm, or you may get a metallic taste in your mouth.  You will be given a medicine called nitroglycerin. This will relax or dilate the arteries in your heart.  The table that you are lying on will move into the CT machine tunnel for the scan.  The person running the machine will give you instructions while the scans are being done. You may be asked to: ? Keep your arms above your head.  ? Hold your breath. ? Stay very still, even if the table is moving.  When the scanning is complete, you will be moved out of the machine.  The IV will be removed. The procedure may vary among health care providers and hospitals. What can I expect after the procedure? After your procedure, it is common to have:  A metallic taste in your mouth from the contrast dye.  A feeling of warmth.  A headache from the nitroglycerin. Follow these instructions at home:  Take over-the-counter and prescription medicines only as told by your health care provider.  If you are told, drink enough fluid to keep your urine pale yellow. This will help to flush the contrast dye out of your body.  Most people can return to their normal activities right after the procedure. Ask your health care provider what activities are safe for you.  It is up to you to get the results of your procedure. Ask your health care provider, or the department that is doing the procedure, when your results will be ready.  Keep all follow-up visits as told by your health care provider. This is important. Contact a health care provider if:  You have any symptoms of allergy to the contrast dye. These include: ? Shortness of breath. ? Rash or hives. ? A racing heartbeat. Summary  A cardiac CT angiogram is a procedure to look at the heart and the area around the heart. It may be done to help find the cause of chest pains or other symptoms of heart disease.  During this procedure, a large X-ray machine, called a CT scanner, takes detailed pictures of the heart and the surrounding area after a contrast dye has been injected into blood vessels in the area.  Ask your health care provider about changing or stopping your regular medicines before the procedure. This is especially important if you are taking diabetes medicines, blood thinners, or medicines to treat erectile dysfunction.  If you are told, drink enough fluid to keep your  urine pale yellow. This will help to flush the contrast dye out of your body. This information is not intended to replace advice given to you by your health care provider. Make sure you discuss any questions you have with your health care provider. Document Revised: 02/25/2019 Document Reviewed: 02/25/2019 Elsevier Patient Education  [place].

## 2019-08-19 NOTE — Progress Notes (Signed)
Follow-up Outpatient Visit Date: 08/19/2019  Primary Care Provider: Kirk Ruths, Warren [place] [address]  Chief Complaint: Follow-up heart failure  HPI:  Austin Warren is a 79 y.o. 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  I last saw Austin Warren in early December, at which time he noted some occasional lightheadedness as well as resting heart rates in the 40s and 50s.  He was without chest pain, shortness of breath, and palpitations.  He remained in sinus rhythm following DCCV in mid November.  Not make any medication changes at that time.  Today, Austin Warren reports feeling well.  He denies shortness of breath, chest pain, palpitations, lightheadedness, and edema.  He requests a refill of levothyroxine, which has been managed by Drs. Austin Warren per Austin Warren's wife.  Austin Warren is schedule for labs and clinic follow-up with Dr. Ouida Sills next month.  --------------------------------------------------------------------------------------------------  Past Medical History:  Diagnosis Date  . Arthritis   . Benign prostatic hypertrophy   . Bursitis of left shoulder   . CHF (congestive heart failure) ([place])   . Diabetes ([place])   . Dyspnea on exertion 11/01/2011  . Elevated PSA   . History of colon polyps   . History of colon polyps   . Hyperlipidemia   . Hypertension   . Melanoma ([place])    under arm  . Microscopic hematuria   . Osteoarthritis   . Retroperitoneal mass   . Stroke ([place]) 05/2014  . TIA (transient ischemic attack)   . Urinary retention   . UTI (urinary tract infection)   . Ventricular tachycardia ([facility])    RBB/LAHB ideopathic VT, 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    Past Surgical History:  Procedure Laterality Date  . CARDIOVERSION    . CARDIOVERSION N/A 03/07/2018   Procedure: CARDIOVERSION;  Surgeon: Austin Warren;  Location: [facility] ORS;  Service: Cardiovascular;  Laterality: N/A;  . CARDIOVERSION N/A 04/06/2019   Procedure: CARDIOVERSION;  Surgeon: Austin Warren;  Location: [place] ORS;  Service: Cardiovascular;  Laterality: N/A;  . CARDIOVERSION N/A 06/02/2019   Procedure: CARDIOVERSION;  Surgeon: Nelva Bush, Warren;  Location: [facility] ORS;  Service: Cardiovascular;  Laterality: N/A;  . COLONOSCOPY WITH PROPOFOL N/A 10/17/2015   Procedure: COLONOSCOPY WITH PROPOFOL;  Surgeon: Manya Silvas, Warren;  Location: [facility] ENDOSCOPY;  Service: Endoscopy;  Laterality: N/A;  . COLONOSCOPY WITH PROPOFOL N/A 03/17/2019   Procedure: COLONOSCOPY WITH PROPOFOL;  Surgeon: Toledo, Austin Pike, Warren;  Location: [facility] ENDOSCOPY;  Service: Gastroenterology;  Laterality: N/A;  . ELECTROPHYSIOLOGIC STUDY N/A 12/07/2015   Procedure: V Tach Ablation;  Surgeon: Austin Warren;  Location: [place] CV LAB;  Service: Cardiovascular;  Laterality: N/A;  . JOINT REPLACEMENT     R TKR  . RADIOLOGY WITH ANESTHESIA N/A 05/18/2014   Procedure: RADIOLOGY WITH ANESTHESIA;  Surgeon: Austin Warren;  Location: [place];  Service: Radiology;  Laterality: N/A;  . REPLACEMENT TOTAL KNEE    . REPLACEMENT TOTAL KNEE Left   . VASECTOMY      Current Meds  Medication Sig  . amiodarone (PACERONE) 200 MG tablet Take 1 tablet (200 mg) by mouth once daily  . apixaban (ELIQUIS) 5 MG TABS tablet Take 1 tablet (5 mg total) by mouth 2 (two) times daily.  Marland Kitchen  Ascorbic Acid (VITAMIN C) 500 MG tablet Take 500 mg by mouth daily.    Marland Kitchen atorvastatin (LIPITOR) 10 MG tablet Take 10 mg by mouth every evening.   . carvedilol (COREG) 3.125 MG tablet TAKE ONE TABLET TWICE DAILY  . Cholecalciferol (VITAMIN D3) 1000 UNITS CAPS Take 1,000 Units by mouth daily.   Marland Kitchen docusate sodium (COLACE) 50 MG  capsule Take 50 mg by mouth 2 (two) times daily.  . finasteride (PROSCAR) 5 MG tablet Take 1 tablet (5 mg total) by mouth daily.  Marland Kitchen levothyroxine (SYNTHROID) 100 MCG tablet Take 1 tablet (100 mcg total) by mouth daily before breakfast.  . mexiletine (MEXITIL) 150 MG capsule Take 1 capsule (150 mg total) by mouth 2 (two) times daily.  . Multiple Vitamin (MULTIVITAMIN WITH MINERALS) TABS tablet Take 1 tablet by mouth daily. One A Day 50+  . tamsulosin (FLOMAX) 0.4 MG CAPS capsule Take 2 capsules (0.8 mg total) by mouth daily.  Marland Kitchen torsemide (DEMADEX) 10 MG tablet Take 10 mg (1 tablet) by mount once daily, though if he gains 2 pounds in a day or 5 pounds in a week, he should return to 20 mg daily until his weight returns to baseline.  . [DISCONTINUED] levothyroxine (SYNTHROID) 100 MCG tablet Take 1 tablet (100 mcg total) by mouth daily before breakfast.    Allergies: Ciprofloxacin, Levofloxacin, Enalapril maleate, Metoprolol, and Sulfa antibiotics  Social History   Tobacco Use  . Smoking status: Former Smoker    Packs/day: 2.00    Years: 10.00    Pack years: 20.00    Types: Cigarettes    Start date: 02/10/1960    Quit date: 02/09/1970    Years since quitting: 49.5  . Smokeless tobacco: Never Used  . Tobacco comment: 05/2014    quit smoking  45 years ago  Substance Use Topics  . Alcohol use: Yes    Comment: 3-4 beer a week  . Drug use: No    Family History  Problem Relation Age of Onset  . Stroke Father   . Hypertension Father   . Heart attack Neg Hx   . Kidney disease Neg Hx   . Prostate cancer Neg Hx   . Kidney cancer Neg Hx   . Bladder Cancer Neg Hx     Review of Systems: A 12-system review of systems was performed and was negative except as noted in the HPI.  --------------------------------------------------------------------------------------------------  Physical Exam: BP (!) 110/52 (BP Location: Left Arm, Patient Position: Sitting, Cuff Size: Normal)   Pulse (!) 48    Ht 5\' 6"  (1.676 m)   Wt 178 lb 8 oz (81 kg)   SpO2 98%   BMI 28.81 kg/m   HR increased to 78 bpm with ambulation.  General:  NAD.  Accompanied by his wife. HEENT: No conjunctival pallor or scleral icterus. Facemask in place. Neck: Supple without lymphadenopathy, thyromegaly, JVD, or HJR. Lungs: Normal work of breathing. Clear to auscultation bilaterally without wheezes or crackles. Heart: Bradycardic but regular without murmurs, rubs, or gallops. Abd: Bowel sounds present. Soft, NT/ND without hepatosplenomegaly Ext: No lower extremity edema.  EKG:  Sinus bradycardia (HR 48 bpm) with low voltage and non-specific ST/T changes.  Lab Results  Component Value Date   WBC 7.7 05/29/2019   HGB 16.2 05/29/2019   HCT 47.0 05/29/2019   MCV 94.0 05/29/2019   PLT 141 (L) 05/29/2019    Lab Results  Component Value Date   NA 141 08/19/2019   K 4.6 08/19/2019  CL 103 08/19/2019   CO2 18 (L) 08/19/2019   BUN 17 08/19/2019   CREATININE 0.86 08/19/2019   GLUCOSE 99 08/19/2019   ALT 23 04/07/2019    Lab Results  Component Value Date   CHOL 124 05/19/2014   HDL 38 (L) 05/19/2014   LDLCALC 58 05/19/2014   TRIG 142 05/19/2014   CHOLHDL 3.3 05/19/2014    --------------------------------------------------------------------------------------------------  ASSESSMENT AND PLAN: Chronic systolic heart failure: Mr. Starr appears euvolemic with stable NYHA class II-III symptoms.  I have personally reviewed his limited echo today, which shows modest improvement in his LV systolic function (0000000 -> 30-35%).  Inferior wall motion abnormality noted, though EKG does not show evidence of prior inferior MI.  Cath in 2012 was also clean.  However, given persistently reduced LVEF, I have recommended cardiac CTA to exclude significant CAD.  We Austin continue his current evidence-based heart failure regimen of carvedilol and spironolactone, as well as torsemide.  Resting bradycardia (albeit asymptomatic  and with normal heart rate response to ambulation) and low normal blood pressure preclude further escalation of carvedilol.  I am also reluctant to rechallenge with ACI/ARB given history of angioedema with enalapril.  We Austin check a BMP today to ensure stable renal function and potassium in anticipation of CTA.  Atrial flutter: Mr. Croswell is maintaining sinus rhythm following DCCV x 2 last year.  Continue apixaban and amiodarone, as well as follow-up with Dr. Caryl Comes.  History of ventricular tachycardia: No evidence of recurrence.  Continue amiodarone and mexiletine, as well as follow-up with Dr. Caryl Comes.  Hypothyroidism: We Austin refill current dose of levothyroxine today.  Further management per Dr. Ouida Sills, whom Mr. Brinton is scheduled to see next month.  Follow-up: Return to clinic in 6-8 weeks.  Nelva Bush, Warren 08/21/2019 7:39 AM

## 2019-08-20 LAB — BASIC METABOLIC PANEL
BUN/Creatinine Ratio: 20 (ref 10–24)
BUN: 17 mg/dL (ref 8–27)
CO2: 18 mmol/L — ABNORMAL LOW (ref 20–29)
Calcium: 8.8 mg/dL (ref 8.6–10.2)
Chloride: 103 mmol/L (ref 96–106)
Creatinine, Ser: 0.86 mg/dL (ref 0.76–1.27)
GFR calc Af Amer: 94 mL/min/{1.73_m2} (ref >59)
GFR calc non Af Amer: 81 mL/min/{1.73_m2} (ref >59)
Glucose: 99 mg/dL (ref 65–99)
Potassium: 4.6 mmol/L (ref 3.5–5.2)
Sodium: 141 mmol/L (ref 134–144)

## 2019-08-21 ENCOUNTER — Encounter: Payer: Self-pay | Admitting: Internal Medicine

## 2019-08-28 ENCOUNTER — Ambulatory Visit: Payer: Medicare Other | Attending: Internal Medicine

## 2019-08-28 DIAGNOSIS — Z23 Encounter for immunization: Secondary | ICD-10-CM

## 2019-08-28 NOTE — Progress Notes (Signed)
   Covid-19 Vaccination Clinic  Name:  SOLLY DAMME    MRN: [id number] DOB: 08-20-1940  08/28/2019  Mr. Wenrick was observed post Covid-19 immunization for 15 minutes without incidence. He was provided with Vaccine Information Sheet and instruction to access the V-Safe system.   Mr. Snide was instructed to call 911 with any severe reactions post vaccine: Marland Kitchen Difficulty breathing  . Swelling of your face and throat  . A fast heartbeat  . A bad rash all over your body  . Dizziness and weakness    Immunizations Administered    Name Date Dose VIS Date Route   Pfizer COVID-19 Vaccine 08/28/2019 11:27 AM 0.3 mL 06/26/2019 Intramuscular   Manufacturer: [place]   Lot: [id number]   [place]: [id number]

## 2019-08-30 ENCOUNTER — Ambulatory Visit: Payer: Medicare Other

## 2019-09-09 ENCOUNTER — Telehealth (HOSPITAL_COMMUNITY): Payer: Self-pay | Admitting: Emergency Medicine

## 2019-09-09 ENCOUNTER — Encounter (HOSPITAL_COMMUNITY): Payer: Self-pay

## 2019-09-09 NOTE — Telephone Encounter (Signed)
Left message on voicemail with name and callback number Feliz Herard RN Navigator Cardiac Imaging [facility] [phone] Office [phone] Cell  

## 2019-09-10 ENCOUNTER — Other Ambulatory Visit: Payer: Self-pay

## 2019-09-10 ENCOUNTER — Ambulatory Visit
Admission: RE | Admit: 2019-09-10 | Discharge: 2019-09-10 | Disposition: A | Discharge status: Home / Self Care | Payer: Medicare Other | Source: Ambulatory Visit | Attending: Internal Medicine | Admitting: Internal Medicine

## 2019-09-10 DIAGNOSIS — I428 Other cardiomyopathies: Secondary | ICD-10-CM | POA: Diagnosis not present

## 2019-09-10 DIAGNOSIS — I77810 Thoracic aortic ectasia: Secondary | ICD-10-CM | POA: Diagnosis not present

## 2019-09-10 DIAGNOSIS — I700 Atherosclerosis of aorta: Secondary | ICD-10-CM | POA: Diagnosis not present

## 2019-09-10 DIAGNOSIS — I2510 Atherosclerotic heart disease of native coronary artery without angina pectoris: Secondary | ICD-10-CM | POA: Diagnosis not present

## 2019-09-10 DIAGNOSIS — I5022 Chronic systolic (congestive) heart failure: Secondary | ICD-10-CM | POA: Insufficient documentation

## 2019-09-10 DIAGNOSIS — R911 Solitary pulmonary nodule: Secondary | ICD-10-CM | POA: Insufficient documentation

## 2019-09-10 MED ORDER — IOHEXOL 350 MG/ML SOLN
75.0000 mL | Freq: Once | INTRAVENOUS | Status: AC | PRN
  Administered 2019-09-10: 75 mL via INTRAVENOUS

## 2019-09-10 MED ORDER — NITROGLYCERIN 0.4 MG SL SUBL
0.8000 mg | SUBLINGUAL_TABLET | Freq: Once | SUBLINGUAL | Status: AC
  Administered 2019-09-10: 0.8 mg via SUBLINGUAL

## 2019-09-10 MED ORDER — DILTIAZEM HCL 25 MG/5ML IV SOLN
5.0000 mg | Freq: Once | INTRAVENOUS | Status: AC
  Administered 2019-09-10: 5 mg via INTRAVENOUS

## 2019-09-10 NOTE — Progress Notes (Signed)
   09/10/19 1128  Vital Signs  ECG Heart Rate 61  BP 124/64  Oxygen Therapy  SpO2 100 %  O2 Device Room Air   CT cardiac exam completed. Patient states he feels fine and ready to go home, denies dizziness/headache.

## 2019-09-11 DIAGNOSIS — I2510 Atherosclerotic heart disease of native coronary artery without angina pectoris: Secondary | ICD-10-CM

## 2019-09-28 ENCOUNTER — Other Ambulatory Visit: Payer: Self-pay | Admitting: Internal Medicine

## 2019-10-13 ENCOUNTER — Encounter: Payer: Self-pay | Admitting: Internal Medicine

## 2019-10-13 NOTE — Progress Notes (Signed)
Follow-up Outpatient Visit Date: 10/14/2019  Primary Care Provider: Kirk Warren, [place] [address]  Chief Complaint: Follow-up heart failure, nonobstructive coronary artery disease, and arrhythmias  HPI:  Austin Warren is a 79 y.o. male with history of coronary artery disease, persistent atrial fibrillation/flutter, recurrent ventricular tachycardia, chronic systolic heart failure due to nonischemic cardiomyopathy, hypertension, hyperlipidemia, stroke, and hypothyroidism, who presents for follow-up of heart failure.  I last him in early February, at which time he was feeling well.  As his LVEF was still reduced following maintenance of sinus rhythm, we obtained a cardiac CTA.  This showed moderate, diffuse coronary artery disease that was not significant by CT-FFR.  BP usually 120's/60's.  Today, Austin Warren reports that he is feeling well.  He denies chest pain, shortness of breath, palpitations, and lightheadedness.  He has chronic minimal ankle edema.  His weight has been relatively stable at home other than a slight increase during a recent trip to [place] and [place].  He is tolerating his medications well.  Home blood pressures are usually in the 120's/60's.  --------------------------------------------------------------------------------------------------  Past Medical History:  Diagnosis Date  . Arthritis   . Benign prostatic hypertrophy   . Bursitis of left shoulder   . CHF (congestive heart failure) ([place])   . Coronary artery disease 08/2019   Moderate multivessel CAD (not hemodynamically significant by CT-FFR)  . Diabetes ([place])   . Dyspnea on exertion 11/01/2011  . Elevated PSA   . History of colon polyps   . History of colon polyps   . Hyperlipidemia   . Hypertension   . Melanoma ([place])    under arm  . Microscopic hematuria   . Osteoarthritis   . Retroperitoneal mass   . Stroke ([place]) 05/2014  . TIA  (transient ischemic attack)   . Urinary retention   . UTI (urinary tract infection)   . Ventricular tachycardia (HCC)    RBB/LAHB ideopathic VT, 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   Past Surgical History:  Procedure Laterality Date  . CARDIOVERSION    . CARDIOVERSION N/A 03/07/2018   Procedure: CARDIOVERSION;  Surgeon: Wellington Hampshire, MD;  Location: [facility] ORS;  Service: Cardiovascular;  Laterality: N/A;  . CARDIOVERSION N/A 04/06/2019   Procedure: CARDIOVERSION;  Surgeon: Wellington Hampshire, MD;  Location: [place] ORS;  Service: Cardiovascular;  Laterality: N/A;  . CARDIOVERSION N/A 06/02/2019   Procedure: CARDIOVERSION;  Surgeon: Nelva Bush, MD;  Location: [facility] ORS;  Service: Cardiovascular;  Laterality: N/A;  . COLONOSCOPY WITH PROPOFOL N/A 10/17/2015   Procedure: COLONOSCOPY WITH PROPOFOL;  Surgeon: Manya Silvas, MD;  Location[facility] END[facility];  Service: Endoscopy;  Laterality: N/A;  . COLONOSCOPY WITH PROPOFOL N/A 03/17/2019   Procedure: COLONOSCOPY WITH PROPOFOL;  Surgeon: Toledo, Benay Pike, MD;  Location: [facility] ENDOSCOPY;  Service: Gastroenterology;  Laterality: N/A;  . ELECTROPHYSIOLOGIC STUDY N/A 12/07/2015   Procedure: V Tach Ablation;  Surgeon: Will Teeple Leeds, MD;  Location: [place] CV LAB;  Service: Cardiovascular;  Laterality: N/A;  . JOINT REPLACEMENT     R TKR  . RADIOLOGY WITH ANESTHESIA N/A 05/18/2014   Procedure: RADIOLOGY WITH ANESTHESIA;  Surgeon: Rob Hickman, MD;  Location: [place];  Service: Radiology;  Laterality: N/A;  . REPLACEMENT TOTAL KNEE    . REPLACEMENT TOTAL KNEE Left   . VASECTOMY     Recent CV Pertinent Labs: Lab Results  Component Value Date   CHOL 124 05/19/2014  HDL 38 (L) 05/19/2014   LDLCALC 58 05/19/2014   TRIG 142 05/19/2014   CHOLHDL 3.3 05/19/2014   INR 1.2 04/07/2019   INR 1.3 05/27/2014   BNP 493.0 (H) 04/07/2019   K 4.6 08/19/2019   K 3.9 05/27/2014   MG 2.0 04/07/2019   MG 1.6 (L) 05/27/2014   BUN 17 08/19/2019   BUN 17  05/27/2014   CREATININE 0.86 08/19/2019   CREATININE 0.84 05/27/2014    Past medical and surgical history were reviewed and updated in EPIC.  Current Meds  Medication Sig  . amiodarone (PACERONE) 200 MG tablet Take 1 tablet (200 mg) by mouth once daily  . apixaban (ELIQUIS) 5 MG TABS tablet Take 1 tablet (5 mg total) by mouth 2 (two) times daily.  . Ascorbic Acid (VITAMIN C) 500 MG tablet Take 500 mg by mouth daily.    Marland Kitchen atorvastatin (LIPITOR) 10 MG tablet Take 10 mg by mouth every evening.   . carvedilol (COREG) 3.125 MG tablet TAKE ONE TABLET TWICE DAILY  . Cholecalciferol (VITAMIN D3) 1000 UNITS CAPS Take 1,000 Units by mouth daily.   Marland Kitchen docusate sodium (COLACE) 50 MG capsule Take 50 mg by mouth 2 (two) times daily.  . finasteride (PROSCAR) 5 MG tablet Take 1 tablet (5 mg total) by mouth daily.  Marland Kitchen levothyroxine (SYNTHROID) 100 MCG tablet Take 1 tablet (100 mcg total) by mouth daily before breakfast.  . mexiletine (MEXITIL) 150 MG capsule Take 1 capsule (150 mg total) by mouth 2 (two) times daily.  . Multiple Vitamin (MULTIVITAMIN WITH MINERALS) TABS tablet Take 1 tablet by mouth daily. One A Day 50+  . tamsulosin (FLOMAX) 0.4 MG CAPS capsule Take 2 capsules (0.8 mg total) by mouth daily.  Marland Kitchen torsemide (DEMADEX) 10 MG tablet Take 10 mg (1 tablet) by mount once daily, though if he gains 2 pounds in a day or 5 pounds in a week, he should return to 20 mg daily until his weight returns to baseline.    Allergies: Ciprofloxacin, Levofloxacin, Enalapril maleate, Metoprolol, and Sulfa antibiotics  Social History   Tobacco Use  . Smoking status: Former Smoker    Packs/day: 2.00    Years: 10.00    Pack years: 20.00    Types: Cigarettes    Start date: 02/10/1960    Quit date: 02/09/1970    Years since quitting: 49.7  . Smokeless tobacco: Never Used  . Tobacco comment: 05/2014    quit smoking  45 years ago  Substance Use Topics  . Alcohol use: Yes    Comment: 3-4 beer a week  . Drug use:  No    Family History  Problem Relation Age of Onset  . Stroke Father   . Hypertension Father   . Heart attack Neg Hx   . Kidney disease Neg Hx   . Prostate cancer Neg Hx   . Kidney cancer Neg Hx   . Bladder Cancer Neg Hx     Review of Systems: A 12-system review of systems was performed and was negative except as noted in the HPI.  --------------------------------------------------------------------------------------------------  Physical Exam: BP 130/64 (BP Location: Left Arm, Patient Position: Sitting, Cuff Size: Normal)   Pulse 61   Ht 5\' 6"  (1.676 m)   Wt 180 lb 2 oz (81.7 kg)   SpO2 98%   BMI 29.07 kg/m   General: NAD.  Accompanied by his wife. Neck: No JVD or HJR. Lungs: Clear to auscultation bilaterally without wheezes or crackles. Heart: Distant heart  sounds.  Regular rate and rhythm without murmurs, rubs, or gallops. Abdomen: Soft, nontender, nondistended. Extremities: Trace pretibial edema bilaterally.  EKG: Sinus rhythm with first-degree AV block, low voltage, and nonspecific T wave changes.  No significant change from 08/19/2019.  Lab Results  Component Value Date   WBC 7.7 05/29/2019   HGB 16.2 05/29/2019   HCT 47.0 05/29/2019   MCV 94.0 05/29/2019   PLT 141 (L) 05/29/2019    Lab Results  Component Value Date   NA 141 08/19/2019   K 4.6 08/19/2019   CL 103 08/19/2019   CO2 18 (L) 08/19/2019   BUN 17 08/19/2019   CREATININE 0.86 08/19/2019   GLUCOSE 99 08/19/2019   ALT 23 04/07/2019    Lab Results  Component Value Date   CHOL 124 05/19/2014   HDL 38 (L) 05/19/2014   LDLCALC 58 05/19/2014   TRIG 142 05/19/2014   CHOLHDL 3.3 05/19/2014    --------------------------------------------------------------------------------------------------  ASSESSMENT AND PLAN: Chronic systolic heart failure due to nonischemic cardiomyopathy: Other than chronic trace pretibial edema, Austin Warren appears relatively euvolemic.  His weight is up 2 pounds since  February.  I have encouraged him to take an additional dose of torsemide if he gains weight or has worsening leg edema.  Otherwise, we will continue his current regimen of torsemide 10 mg daily, carvedilol 3.125 mg twice daily, and spironolactone 25 mg daily.  We will defer rechallenging with ACE inhibitor or ARB, given history of angioedema with enalapril.  Nonobstructive coronary artery disease: Austin Warren does not have any signs or symptoms to suggest worsening coronary insufficiency.  Recent cardiac CTA showed fairly significant calcification and disease in multiple vessels, none of which was hemodynamically significant by CT FFR.  We will continue with atorvastatin 10 mg daily to prevent progression of disease; recent LDL was at goal (less than 70).  Austin Warren is not on aspirin given the long-term anticoagulation with apixaban.  Persistent atrial fibrillation: Austin Warren is maintaining sinus rhythm.  He should continue apixaban and amiodarone, with ongoing follow-up by Dr. Caryl Comes.  History of ventricular tachycardia: No symptoms reported to suggest recurrent VT.  EKG today again shows sinus rhythm.  Continue amiodarone and mexiletine under the direction of Dr. Caryl Comes.  Follow-up: Return to clinic in 4 months.  We will arrange for Austin Warren to follow-up with Dr. Caryl Comes in 4 to 6 weeks.  Nelva Bush, MD 10/14/2019 9:49 AM

## 2019-10-14 ENCOUNTER — Encounter: Payer: Self-pay | Admitting: Internal Medicine

## 2019-10-14 ENCOUNTER — Other Ambulatory Visit: Payer: Self-pay

## 2019-10-14 ENCOUNTER — Ambulatory Visit (INDEPENDENT_AMBULATORY_CARE_PROVIDER_SITE_OTHER): Payer: Medicare Other | Admitting: Internal Medicine

## 2019-10-14 VITALS — BP 130/64 | HR 61 | Ht 66.0 in | Wt 180.1 lb

## 2019-10-14 DIAGNOSIS — I4819 Other persistent atrial fibrillation: Secondary | ICD-10-CM | POA: Diagnosis not present

## 2019-10-14 DIAGNOSIS — I2510 Atherosclerotic heart disease of native coronary artery without angina pectoris: Secondary | ICD-10-CM | POA: Diagnosis not present

## 2019-10-14 DIAGNOSIS — I5022 Chronic systolic (congestive) heart failure: Secondary | ICD-10-CM | POA: Diagnosis not present

## 2019-10-14 DIAGNOSIS — Z8679 Personal history of other diseases of the circulatory system: Secondary | ICD-10-CM

## 2019-10-14 NOTE — Patient Instructions (Signed)
Medication Instructions:  Your physician recommends that you continue on your current medications as directed. Please refer to the Current Medication list given to you today.  *If you need a refill on your cardiac medications before your next appointment, please call your pharmacy*  Follow-Up: Your physician recommends that you schedule a follow-up appointment with Dr Caryl Comes in late April to early May.   At [facility], you and your health needs are our priority.  As part of our continuing mission to provide you with exceptional heart care, we have created designated Provider Care Teams.  These Care Teams include your primary Cardiologist (physician) and Advanced Practice Providers (APPs -  Physician Assistants and Nurse Practitioners) who all work together to provide you with the care you need, when you need it.  We recommend signing up for the patient portal called "MyChart".  Sign up information is provided on this After Visit Summary.  MyChart is used to connect with patients for Virtual Visits (Telemedicine).  Patients are able to view lab/test results, encounter notes, upcoming appointments, etc.  Non-urgent messages can be sent to your provider as well.   To learn more about what you can do with MyChart, go to [email or web address].    Your next appointment:   3 month(s)  The format for your next appointment:   In Person  Provider:    You may see DR Harrell Gave END or one of the following Advanced Practice Providers on your designated Care Team:    Murray Hodgkins, NP  Christell Faith, PA-C  Marrianne Mood, PA-C

## 2019-11-02 ENCOUNTER — Other Ambulatory Visit: Payer: Self-pay | Admitting: Urology

## 2019-11-02 ENCOUNTER — Other Ambulatory Visit: Payer: Self-pay | Admitting: Internal Medicine

## 2019-11-02 DIAGNOSIS — N138 Other obstructive and reflux uropathy: Secondary | ICD-10-CM

## 2019-11-16 ENCOUNTER — Other Ambulatory Visit: Payer: Self-pay | Admitting: Internal Medicine

## 2019-11-16 NOTE — Telephone Encounter (Signed)
Pt is taking Spironolactone 25 mg qd. Medication not on pt's last office note med list or previous visit medlist. Pt requesting 90 day refill. Please advise.

## 2019-11-17 NOTE — Telephone Encounter (Signed)
This encounter was created in error - please disregard.

## 2019-11-17 NOTE — Telephone Encounter (Signed)
Patient wife is calling regarding Spironolactone rx.

## 2019-11-26 ENCOUNTER — Encounter: Payer: Self-pay | Admitting: Internal Medicine

## 2019-11-26 ENCOUNTER — Ambulatory Visit (INDEPENDENT_AMBULATORY_CARE_PROVIDER_SITE_OTHER): Payer: Medicare Other | Admitting: Internal Medicine

## 2019-11-26 ENCOUNTER — Other Ambulatory Visit: Payer: Self-pay

## 2019-11-26 VITALS — BP 118/60 | HR 49 | Ht 66.0 in | Wt 179.0 lb

## 2019-11-26 DIAGNOSIS — I428 Other cardiomyopathies: Secondary | ICD-10-CM

## 2019-11-26 DIAGNOSIS — Z79899 Other long term (current) drug therapy: Secondary | ICD-10-CM | POA: Diagnosis not present

## 2019-11-26 DIAGNOSIS — I5022 Chronic systolic (congestive) heart failure: Secondary | ICD-10-CM | POA: Diagnosis not present

## 2019-11-26 DIAGNOSIS — I472 Ventricular tachycardia, unspecified: Secondary | ICD-10-CM

## 2019-11-26 MED ORDER — CARVEDILOL 3.125 MG PO TABS: 3.1250 mg | ORAL_TABLET | Freq: Two times a day (BID) | ORAL | 3 refills | Status: DC

## 2019-11-26 MED ORDER — MEXILETINE HCL 150 MG PO CAPS: 150.0000 mg | ORAL_CAPSULE | Freq: Two times a day (BID) | ORAL | 3 refills | Status: DC

## 2019-11-26 MED ORDER — SPIRONOLACTONE 25 MG PO TABS: 25.0000 mg | ORAL_TABLET | Freq: Every day | ORAL | 3 refills | Status: DC

## 2019-11-26 NOTE — Patient Instructions (Signed)
Medication Instructions:  - Your physician recommends that you continue on your current medications as directed. Please refer to the Current Medication list given to you today.  *If you need a refill on your cardiac medications before your next appointment, please call your pharmacy*   Lab Work: - none ordered  If you have labs (blood work) drawn today and your tests are completely normal, you will receive your results only by: Marland Kitchen MyChart Message (if you have MyChart) OR . A paper copy in the mail If you have any lab test that is abnormal or we need to change your treatment, we will call you to review the results.   Testing/Procedures: - none ordered   Follow-Up: At [facility], you and your health needs are our priority.  As part of our continuing mission to provide you with exceptional heart care, we have created designated Provider Care Teams.  These Care Teams include your primary Cardiologist (physician) and Advanced Practice Providers (APPs -  Physician Assistants and Nurse Practitioners) who all work together to provide you with the care you need, when you need it.  We recommend signing up for the patient portal called "MyChart".  Sign up information is provided on this After Visit Summary.  MyChart is used to connect with patients for Virtual Visits (Telemedicine).  Patients are able to view lab/test results, encounter notes, upcoming appointments, etc.  Non-urgent messages can be sent to your provider as well.   To learn more about what you can do with MyChart, go to [email or web address].    Your next appointment:   November 2021   The format for your next appointment:   In Person  Provider:   Virl Axe, MD   Other Instructions n/a

## 2019-11-26 NOTE — Progress Notes (Signed)
Patient Care Team: Kirk Ruths, MD as PCP - General (Unknown Physician Specialty)   HPI  Austin Warren is a 79 y.o. male Seen in followup for a ventricular tachycardia with a right bundle superior axis relatively narrow QRS complex and positive concordance suggesting a septal origin and possible verapamil sensitivity. C   atheterization had demonstrated normal coronary arteries and normal left ventricular function. ECG was notable for marked reduction in voltage   Signal average Electrocardiogram was markedly abnormal.  MRI 4/16>>There is no significant change from the prior study with persistent basal inferior and inferolateral late gadolinium enhancement slightly more pronounced on the current study and a small focal late gadolinium enhancement at the point of inferior RV attachment to the LV wall.  He also underwent fat pad biopsy>> neg  Echocardiogram 11/15 demonstrated left ventricular hypertrophy with an ejection fraction of 45-50% with hypokinesis of the inferior apical myocardium   We initially tried him on a beta blocker prescription. He had recurrent ventricular tachycardia. He was put on verapamil.   Had recurrent tachycardia >>>  started on mexiletine and resumed on amiodarone after hiatus of years    5/17 he was transferred to [facility] in [place]. Evaluation included an echo with EF of 40-45%. Cardiac MR demonstrated EF of 43% with inferolateral and apical hypokinesis with inferolateral basilar scar which was if anything not worse compared to prior MR 4/16. He underwent EP testing. No scar was found and no ablation undertaken. Ventricular flutter was inducible. VT  was not. There have been discussions regarding ICD implantation. He elected to defer that decision  Discharge off amiod 2/2 bradycardia  He was seen again 6/17 for recurrent ventricular tachycardia. ECG was reviewed. Right bundle superior axis at a cycle length of just under 300 ms  6/17 resumed on  amiodarone  Bradycardia required down titration of amiodarone now on combination with mexiletine for ventricular tachycardia   No interval VT  He  had  stroke  11/15. He was found to be in atrial fibrillation. He was treated with TPA. Apixaban was initiated.  He has intellectual consequences and aphasia; these are improving    8/19 found to be in AFib DCCV>> to assess benefit of sinus rhythm   Date Cr K TSH Hgb LFTs PFTs  6/17  0.94 3.9 2.8  25    8/18 0.9 4.0 7.5  16   1/19   4.99     2/19 0.8 4.0 3.9  18   8/19 0.86 4.6  15.5    2/20 0.9 4.0 4.87  22   10/20 1.08 4.7 6.7 16.5 23   3/21 0.9 4.4 4.55  21              DATE Rate PR interval QRSduration Dose-Amio  10/18 62  162 112 200  1/18 54 212 112 200  1/19  66  206  104  200 x 5/w  12/19 58 220 106 200 x 5/w  5/21 57 180 102 200 d    Patient denies symptoms of GI intolerance, sun sensitivity, neurological symptoms attributable to amiodarone.    The patient denies chest pain, nocturnal dyspnea, orthopnea or peripheral edema.  There have been no palpitations, lightheadedness or syncope. Mild sob and fatigue but overall doing well    Past Medical History:  Diagnosis Date  . Arthritis   . Benign prostatic hypertrophy   . Bursitis of left shoulder   . CHF (congestive heart failure) ([place])   . Coronary  artery disease 08/2019   Moderate multivessel CAD (not hemodynamically significant by CT-FFR)  . Diabetes ([place])   . Dyspnea on exertion 11/01/2011  . Elevated PSA   . History of colon polyps   . History of colon polyps   . Hyperlipidemia   . Hypertension   . Melanoma ([place])    under arm  . Microscopic hematuria   . Osteoarthritis   . Retroperitoneal mass   . Stroke ([place]) 05/2014  . TIA (transient ischemic attack)   . Urinary retention   . UTI (urinary tract infection)   . Ventricular tachycardia (HCC)    RBB/LAHB ideopathic VT, 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    Past Surgical History:  Procedure Laterality Date   . CARDIOVERSION    . CARDIOVERSION N/A 03/07/2018   Procedure: CARDIOVERSION;  Surgeon: Wellington Hampshire, MD;  Location: [facility] ORS;  Service: Cardiovascular;  Laterality: N/A;  . CARDIOVERSION N/A 04/06/2019   Procedure: CARDIOVERSION;  Surgeon: Wellington Hampshire, MD;  Location: [place] [facility];  Service: Cardiovascular;  Laterality: N/A;  . CARDIOVERSION N/A 06/02/2019   Procedure: CARDIOVERSION;  Surgeon: Nelva Bush, MD;  Location: [facility] ORS;  Service: Cardiovascular;  Laterality: N/A;  . COLONOSCOPY WITH PROPOFOL N/A 10/17/2015   Procedure: COLONOSCOPY WITH PROPOFOL;  Surgeon: Manya Silvas, MD;  Location: [facility] ENDOSCOPY;  Service: Endoscopy;  Laterality: N/A;  . COLONOSCOPY WITH PROPOFOL N/A 03/17/2019   Procedure: COLONOSCOPY WITH PROPOFOL;  Surgeon: Toledo, Benay Pike, MD;  Location: [facility] ENDOSCOPY;  Service: Gastroenterology;  Laterality: N/A;  . ELECTROPHYSIOLOGIC STUDY N/A 12/07/2015   Procedure: V Tach Ablation;  Surgeon: Will Renfrow Leeds, MD;  Location: [place] CV LAB;  Service: Cardiovascular;  Laterality: N/A;  . JOINT REPLACEMENT     R TKR  . RADIOLOGY WITH ANESTHESIA N/A 05/18/2014   Procedure: RADIOLOGY WITH ANESTHESIA;  Surgeon: Rob Hickman, MD;  Location: [place];  Service: Radiology;  Laterality: N/A;  . REPLACEMENT TOTAL KNEE    . REPLACEMENT TOTAL KNEE Left   . VASECTOMY      Current Outpatient Medications  Medication Sig Dispense Refill  . amiodarone (PACERONE) 200 MG tablet Take 1 tablet (200 mg) by mouth once daily    . apixaban (ELIQUIS) 5 MG TABS tablet Take 1 tablet (5 mg total) by mouth 2 (two) times daily. 180 tablet 3  . Ascorbic Acid (VITAMIN C) 500 MG tablet Take 500 mg by mouth daily.      Marland Kitchen atorvastatin (LIPITOR) 10 MG tablet Take 10 mg by mouth every evening.     . carvedilol (COREG) 3.125 MG tablet TAKE ONE TABLET TWICE DAILY 60 tablet 2  . Cholecalciferol (VITAMIN D3) 1000 UNITS CAPS Take 1,000 Units by mouth daily.     Marland Kitchen docusate sodium  (COLACE) 50 MG capsule Take 50 mg by mouth 2 (two) times daily.    . finasteride (PROSCAR) 5 MG tablet Take 1 tablet (5 mg total) by mouth daily. 90 tablet 3  . levothyroxine (SYNTHROID) 100 MCG tablet Take 1 tablet (100 mcg total) by mouth daily before breakfast. 30 tablet 2  . mexiletine (MEXITIL) 150 MG capsule Take 1 capsule (150 mg total) by mouth 2 (two) times daily. 180 capsule 3  . Multiple Vitamin (MULTIVITAMIN WITH MINERALS) TABS tablet Take 1 tablet by mouth daily. One A Day 50+    . spironolactone (ALDACTONE) 25 MG tablet TAKE ONE TABLET EVERY DAY 30 tablet 6  . tamsulosin (FLOMAX) 0.4 MG CAPS capsule Take 2 capsules (0.8 mg  total) by mouth daily. 180 capsule 3  . torsemide (DEMADEX) 10 MG tablet Take 10 mg (1 tablet) by mount once daily, though if he gains 2 pounds in a day or 5 pounds in a week, he should return to 20 mg daily until his weight returns to baseline. 90 tablet 2   No current facility-administered medications for this visit.    Allergies  Allergen Reactions  . Ciprofloxacin Anaphylaxis  . Levofloxacin Anaphylaxis    "throat closes"  . Enalapril Maleate Swelling    Angioedema  . Metoprolol Swelling    edema  . Sulfa Antibiotics Rash and Other (See Comments)    Hypotension    Review of Systems negative except from HPI and PMH  Physical Exam BP 118/60 (BP Location: Left Arm, Patient Position: Sitting, Cuff Size: Normal)   Pulse (!) 49   Ht 5\' 6"  (1.676 m)   Wt 179 lb (81.2 kg)   BMI 28.89 kg/m  Well developed and nourished in no acute distress HENT normal Neck supple with JVP-  Flat  Clear Regular rate and rhythm, no murmurs or gallops Abd-soft with active BS No Clubbing cyanosis edema Skin-warm and dry A & Oriented  Grossly normal sensory and motor function  ECG sinus @ 49 23/10/43   Assessment and  Plan  Ventricular tachycardia  Low-voltage ECG  Cardiomyopathy-nonischemic abnormal signal average ECG/MRI negative fat pad  biopsy  Persistent atrial fibrillation  Stroke   Sinus bradycardia  Hypothyroidism-iatrogenic-treated  High Risk Medication Surveillance amiodarone     Maintaining sinus  Continue amio  Tolerating amio  Lengthy discussion re his meds and decreasing or stopping-- he and his wife is inclined to "leave things alone as he is doing well"  Hard to argue with it.  Reviewing the time sequence, the mex had failed to be sufficient by itself and amio has repeatedly been effective  Surveillance labs ok  No intercurrent atrial fibrillation or flutter  On Anticoagulation;  No bleeding issues   Euvolemic continue current meds  More than 50% of 45 min was spent in counseling related to the above

## 2019-12-07 ENCOUNTER — Other Ambulatory Visit: Payer: Self-pay | Admitting: Internal Medicine

## 2020-01-04 ENCOUNTER — Ambulatory Visit: Payer: Medicare Other | Admitting: Internal Medicine

## 2020-01-07 ENCOUNTER — Encounter: Payer: Self-pay | Admitting: Physician Assistant

## 2020-01-07 ENCOUNTER — Other Ambulatory Visit: Payer: Self-pay

## 2020-01-07 ENCOUNTER — Ambulatory Visit: Payer: Medicare Other | Admitting: Physician Assistant

## 2020-01-07 VITALS — BP 118/60 | HR 60 | Ht 66.0 in | Wt 179.1 lb

## 2020-01-07 DIAGNOSIS — I5022 Chronic systolic (congestive) heart failure: Secondary | ICD-10-CM | POA: Diagnosis not present

## 2020-01-07 DIAGNOSIS — Z8679 Personal history of other diseases of the circulatory system: Secondary | ICD-10-CM

## 2020-01-07 DIAGNOSIS — I480 Paroxysmal atrial fibrillation: Secondary | ICD-10-CM | POA: Diagnosis not present

## 2020-01-07 DIAGNOSIS — I2510 Atherosclerotic heart disease of native coronary artery without angina pectoris: Secondary | ICD-10-CM

## 2020-01-07 DIAGNOSIS — I428 Other cardiomyopathies: Secondary | ICD-10-CM | POA: Diagnosis not present

## 2020-01-07 NOTE — Progress Notes (Signed)
Office Visit    Patient Name: Austin Warren Date of Encounter: 01/11/2020  Primary Care Provider:  Kirk Ruths, MD Primary Cardiologist:  No primary care provider on file.  Chief Complaint    Chief Complaint  Patient presents with  . office visit    Pt has no complaints. Meds verbally reviewed w/ pt.   79 yo male with a hx of persistent atrial fibrillation s/p most recent DCCV 04/06/2019 and 05/2019, VT s/p 11/2015 ablation, RBBB, NICM, HFrEF, nonobstructive CAD, hypertension, hyperlipidemia, history of stroke 2015, hypothyroidism, and who is being seen today for followup.   Past Medical History    Past Medical History:  Diagnosis Date  . Arthritis   . Benign prostatic hypertrophy   . Bursitis of left shoulder   . CHF (congestive heart failure) ([place])   . Coronary artery disease 08/2019   Moderate multivessel CAD (not hemodynamically significant by CT-FFR)  . Diabetes ([place])   . Dyspnea on exertion 11/01/2011  . Elevated PSA   . History of colon polyps   . History of colon polyps   . Hyperlipidemia   . Hypertension   . Melanoma ([place])    under arm  . Microscopic hematuria   . Osteoarthritis   . Retroperitoneal mass   . Stroke ([place]) 05/2014  . TIA (transient ischemic attack)   . Urinary retention   . UTI (urinary tract infection)   . Ventricular tachycardia ([facility])    RBB/LAHB ideopathic VT, 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   Past Surgical History:  Procedure Laterality Date  . CARDIOVERSION    . CARDIOVERSION N/A 03/07/2018   Procedure: CARDIOVERSION;  Surgeon: Wellington Hampshire, MD;  Location: [facility] ORS;  Service: Cardiovascular;  Laterality: N/A;  . CARDIOVERSION N/A 04/06/2019   Procedure: CARDIOVERSION;  Surgeon: Wellington Hampshire, MD;  Location: [place] [facility];  Service: Cardiovascular;  Laterality: N/A;  . CARDIOVERSION N/A 06/02/2019   Procedure: CARDIOVERSION;  Surgeon: Nelva Bush, MD;  Location: [facility] ORS;  Service: Cardiovascular;  Laterality: N/A;  .  COLONOSCOPY WITH PROPOFOL N/A 10/17/2015   Procedure: COLONOSCOPY WITH PROPOFOL;  Surgeon: Manya Silvas, MD;  Location: [facility] ENDOSCOPY;  Service: Endoscopy;  Laterality: N/A;  . COLONOSCOPY WITH PROPOFOL N/A 03/17/2019   Procedure: COLONOSCOPY WITH PROPOFOL;  Surgeon: Toledo, Benay Pike, MD;  Location: [facility] ENDOSCOPY;  Service: Gastroenterology;  Laterality: N/A;  . ELECTROPHYSIOLOGIC STUDY N/A 12/07/2015   Procedure: V Tach Ablation;  Surgeon: Will Bora Leeds, MD;  Location: [place] CV LAB;  Service: Cardiovascular;  Laterality: N/A;  . JOINT REPLACEMENT     R TKR  . RADIOLOGY WITH ANESTHESIA N/A 05/18/2014   Procedure: RADIOLOGY WITH ANESTHESIA;  Surgeon: Rob Hickman, MD;  Location: [place];  Service: Radiology;  Laterality: N/A;  . REPLACEMENT TOTAL KNEE    . REPLACEMENT TOTAL KNEE Left   . VASECTOMY      Allergies  Allergies  Allergen Reactions  . Ciprofloxacin Anaphylaxis  . Levofloxacin Anaphylaxis    "throat closes"  . Enalapril Maleate Swelling    Angioedema  . Metoprolol Swelling    edema  . Sulfa Antibiotics Rash and Other (See Comments)    Hypotension    History of Present Illness    Austin Warren is a 79 y.o. male with PMH as above.   He is followed by Dr. Caryl Comes for ventricular tachycardia, RBBB with superior axis and narrow QRS (suggesting septal origin and possible verapamil sensativity), and paroxysmal atrial fibrillation. He had  a CVA 05/2014 and found to be Afib with TPA treatment and started on apixaban.   Past LHC showed normal cors; however, this cath report is not available via EMR. In addition to verapamil, he was started on mexiletine. Low voltage EKG was noted.   He underwent MRI 4/16 that showed an inferolateral late gadolinium enhancement slightly more pronounced and small focal late gadolinium enhancement at the point of inferior RV attachment to the LV wall.  He underwent fat pad biopsy that was negative.  In 2017, he was admitted  with sustained ventricular tachycardia and transferred to [facility] in [place] storm. Cardiac MRI showed EF 43% with inferolateral and apical hypokinesis and inferolateral basilar scar though similar to prior MR from 4/16.  12/02/15 echo showed EF 40-45%. Repeat echo 12/05/2015 showed LVEF 40 to 45% with hypokinesis worse at the apex, mild AR/MR, mild LAE, PASP 52 mmHg.    On 12/07/15, he presented for V tach radioablation and was in NSR. 3D mapping of the left ventricle showed no evidence of endocardial scar. During the procedure, ventricular tachycardia could not be induced but ventricular flutter was induced at a rate of 220 ms. During this time, the patient became unstable and lost consciousness, requiring urgent cardioversion. ICD implantation was discussed but deferred.  He was discharged on increased mexiletine.    He was seen again 6/17 for recurrent ventricular tachycardia with EKG showing right bundle superior axis at cycle length of just under 300 ms. on 617, he was started on amiodarone.  Due to bradycardia, he required down titration of amiodarone.  He underwent DCCV for Afib 03/07/2018. However, over the summer of 2020, the he noted faster rates in the 90s to 110s, At follow-up in the office, he noted symptoms that included he felt he was reluctant to exercise, and he also noted some LEE.  It was suspected he had been back in atrial fibrillation for approximately 3 months with modest symptoms.  Amiodarone was increased from 200 mg 3 times daily to 200 mg 7 times per week.  He was scheduled for cardioversion 04/06/2019, due to needing to restart his [place] (which was held for a colonoscopy that same week).  It was noted that, following cardioversion, it was anticipated amiodarone would be reduced to 5 times per week at follow-up in approximately 6 weeks after cardioversion. For LEE, he was continued on his torsemide with notes stating this would also be reevaluated with restoration of NSR.   He underwent  cardioversion 04/06/2019 and 05/2019.    Echo was performed in 08/2019 with improved LVEF though still low at 30-35% and inferior WMA noted. EKG without evidence of previous inferior MI. Given cath in 2012 was clean, cardiac CTA was recommended to exclude CAD and performed as below and showed obstruction was likely not the etiology of his reduced EF. CTA showed significant calcification and dz in multiple vessels but none that was hemodynamically significant by CT FFR.   When last seen in clinic, he was maintaining NSR. He was euvolemic on exam with wt up 2 lbs from February. It was recommended he continue daily torsemide with additional dose for weight gain or worsening LEE.  Atorvastatin was continued.  ACE/ARB not started given history on angioedema with enalapril. Coreg was not increased due to resting bradycardia.   Today, he returns to clinic and notes that he is doing well. He is monitoring his home weights and BP with weight 175 lbs and SBP 130 DBP 60s. He notes HR 40s-100s.  He denies chest pain, palpitations, dyspnea, pnd, orthopnea, n, v, dizziness, syncope, edema, weight gain, or early satiety. He continues on [place] without s/sx of bleeding. He continues on amiodarone per Dr. Caryl Comes. He is taking his torsemide 10mg  qd with an additional dose if needed for weight gain. His weight is one lb down from his previous clinic visit.   Home Medications    Prior to Admission medications   Medication Sig Start Date End Date Taking? Authorizing Provider  amiodarone (PACERONE) 200 MG tablet Take 1 tablet (200 mg) by mouth once daily 06/05/19   Deboraha Sprang, MD  apixaban (ELIQUIS) 5 MG TABS tablet Take 1 tablet (5 mg total) by mouth 2 (two) times daily. 03/19/19   Deboraha Sprang, MD  Ascorbic Acid (VITAMIN C) 500 MG tablet Take 500 mg by mouth daily.      [provider]  atorvastatin (LIPITOR) 10 MG tablet Take 10 mg by mouth every evening.  03/07/15   [provider]  carvedilol (COREG)  3.125 MG tablet Take 1 tablet (3.125 mg total) by mouth 2 (two) times daily. 11/26/19   Deboraha Sprang, MD  Cholecalciferol (VITAMIN D3) 1000 UNITS CAPS Take 1,000 Units by mouth daily.     [provider]  docusate sodium (COLACE) 50 MG capsule Take 50 mg by mouth 2 (two) times daily.    [provider]  finasteride (PROSCAR) 5 MG tablet Take 1 tablet (5 mg total) by mouth daily. 01/22/19   Zara Council A, PA-C  levothyroxine (SYNTHROID) 100 MCG tablet Take 1 tablet (100 mcg total) by mouth daily before breakfast. 08/19/19   Deboraha Sprang, MD  mexiletine (MEXITIL) 150 MG capsule Take 1 capsule (150 mg total) by mouth 2 (two) times daily. 11/26/19   Deboraha Sprang, MD  Multiple Vitamin (MULTIVITAMIN WITH MINERALS) TABS tablet Take 1 tablet by mouth daily. One A Day 50+    [provider]  spironolactone (ALDACTONE) 25 MG tablet Take 1 tablet (25 mg total) by mouth daily. 11/26/19   Deboraha Sprang, MD  tamsulosin (FLOMAX) 0.4 MG CAPS capsule Take 2 capsules (0.8 mg total) by mouth daily. 01/22/19   McGowan, Larene Beach A, PA-C  torsemide (DEMADEX) 10 MG tablet TAKE ONE TAB (10MG ) BY MOUTH ONCE DAILY,THOUGH IF HE GAINS 2 POUNDS IN A DAY OR 5 POUNDS IN A WEEK, HE SHOULD RETURN TO 20MG  DAILY UNTIL BACK TO BASELINE WEIGHT 12/07/19   End, Harrell Gave, MD    Review of Systems    He denies chest pain, palpitations, dyspnea, pnd, orthopnea, n, v, dizziness, syncope, edema, weight gain, or early satiety.   All other systems reviewed and are otherwise negative except as noted above.  Physical Exam    VS:  BP 118/60 (BP Location: Left Arm, Patient Position: Sitting, Cuff Size: Normal)   Pulse 60   Ht 5\' 6"  (1.676 m)   Wt 179 lb 2 oz (81.3 kg)   SpO2 98%   BMI 28.91 kg/m  , BMI Body mass index is 28.91 kg/m. GEN: Well nourished, well developed, in no acute distress. HEENT: normal. Neck: Supple, no JVD, carotid bruits, or masses. Cardiac: RRR, no murmurs, rubs, or gallops. No  clubbing, cyanosis. Trace to mild edema.  Radials/DP/PT 2+ and equal bilaterally.  Respiratory:  Respirations regular and unlabored, clear to auscultation bilaterally. GI: Soft, nontender, nondistended, BS + x 4. MS: no deformity or atrophy. Skin: warm and dry, no rash. Neuro:  Strength and sensation are  intact. Psych: Normal affect.  Accessory Clinical Findings    ECG personally reviewed by me today - SR, 1st degree AVB, IVCD, nonspecific T wave abnormality, low voltage EKG, poor conduction leads I-III, poor R wave progreession, 60bpm - no acute changes.  VITALS Reviewed today   Temp Readings from Last 3 Encounters:  06/02/19 98 F (36.7 C) (Oral)  04/13/19 (!) 97.3 F (36.3 C)  04/08/19 97.7 F (36.5 C) (Oral)   BP Readings from Last 3 Encounters:  01/07/20 118/60  11/26/19 118/60  10/14/19 130/64   Pulse Readings from Last 3 Encounters:  01/07/20 60  11/26/19 (!) 49  10/14/19 61    Wt Readings from Last 3 Encounters:  01/07/20 179 lb 2 oz (81.3 kg)  11/26/19 179 lb (81.2 kg)  10/14/19 180 lb 2 oz (81.7 kg)     LABS  reviewed today   Lab Results  Component Value Date   WBC 7.7 05/29/2019   HGB 16.2 05/29/2019   HCT 47.0 05/29/2019   MCV 94.0 05/29/2019   PLT 141 (L) 05/29/2019   Lab Results  Component Value Date   CREATININE 0.86 08/19/2019   BUN 17 08/19/2019   NA 141 08/19/2019   K 4.6 08/19/2019   CL 103 08/19/2019   CO2 18 (L) 08/19/2019   Lab Results  Component Value Date   ALT 23 04/07/2019   AST 28 04/07/2019   ALKPHOS 111 04/07/2019   BILITOT 1.4 (H) 04/07/2019   Lab Results  Component Value Date   CHOL 124 05/19/2014   HDL 38 (L) 05/19/2014   LDLCALC 58 05/19/2014   TRIG 142 05/19/2014   CHOLHDL 3.3 05/19/2014    Lab Results  Component Value Date   HGBA1C 5.2 04/07/2019   Lab Results  Component Value Date   TSH 4.905 (H) 05/29/2019     STUDIES/PROCEDURES reviewed today   Echo 08/2019 1. Left ventricular ejection fraction,  by visual estimation, is 30 to  35%. The left ventricle has moderate to severely decreased function. There  is no increased left ventricular wall thickness.  2. Mildly dilated left ventricular internal cavity size.  3. The left ventricle demonstrates regional wall motion abnormalities.  4. The inferolateral wall and inferior walls of the LV are severely  hypokinetic.  5. Global right ventricle has mildly reduced systolic function.The right  ventricular size is normal. Right ventricular wall thickness was not  assessed.  6. Left atrial size was mild-moderately dilated.  7. The mitral valve is grossly normal. mild to moderate mitral valve  regurgitation.  8. The tricuspid valve was grossly normal.   Cardiac CT 08/2019 FINDINGS: FFRct analysis was performed on the original cardiac CT angiogram dataset. Diagrammatic representation of the FFRct analysis is provided in a separate PDF document in PACS. This dictation was created using the PDF document and an interactive 3D model of the results. 3D model is not available in the EMR/PACS. Normal FFR range is >0.80. 1. Left Main:  No significant stenosis. 2. LAD: No significant stenosis.  FFR 0.89 3. LCX: No significant stenosis.  FFR 0.86 4. RCA: No significant stenosis.  FFR 0.97 IMPRESSION: 1.  CT FFR analysis didn't show any significant stenosis.  Assessment & Plan    Chronic HFrEF due to NICM --Chronic LEE but euvolemic on exam with wt down 1 lb from previous clinic visit. Recent echo as above with CTA showing reduced EF likely not 2/2 blockage.Continue current medications. Monitor weights, BP. Not on ACE/ARB given history of  angioedema with enalapril.  Nonobstructive CAD --No sx concerning for angina. Recent CTA as above with significant dz of multiple vessels but not hemodynamically significant by CT FFR. Continue current statin with LDL goal below 70. Not on ASA given long term anticoagulation with Eliquis.   Paroxysmal  atrial fibrillation --Maintaining NSR on amiodarone and Eliquis. Continue to follow with Dr. Caryl Comes.   History of VT --No sx to suggest recurrence of VT. EKG NSR. Continue amiodarone and mexiletine and to folow with Dr. Caryl Comes.   Disposition: RTC 6 months    Arvil Chaco, PA-C

## 2020-01-07 NOTE — Patient Instructions (Signed)
Medication Instructions:   1. Your physician recommends that you continue on your current medications as directed. Please refer to the Current Medication list given to you today.  *If you need a refill on your cardiac medications before your next appointment, please call your pharmacy*   Lab Work:  1. None Ordered   If you have labs (blood work) drawn today and your tests are completely normal, you will receive your results only by: Marland Kitchen MyChart Message (if you have MyChart) OR . A paper copy in the mail If you have any lab test that is abnormal or we need to change your treatment, we will call you to review the results.   Testing/Procedures:  1. None Ordered  Follow-Up: At [facility], you and your health needs are our priority.  As part of our continuing mission to provide you with exceptional heart care, we have created designated Provider Care Teams.  These Care Teams include your primary Cardiologist (physician) and Advanced Practice Providers (APPs -  Physician Assistants and Nurse Practitioners) who all work together to provide you with the care you need, when you need it.  We recommend signing up for the patient portal called "MyChart".  Sign up information is provided on this After Visit Summary.  MyChart is used to connect with patients for Virtual Visits (Telemedicine).  Patients are able to view lab/test results, encounter notes, upcoming appointments, etc.  Non-urgent messages can be sent to your provider as well.   To learn more about what you can do with MyChart, go to [email or web address].    Your next appointment:   6 month(s)  The format for your next appointment:   In Person  Provider:    You may see Nelva Bush, MD or one of the following Advanced Practice Providers on your designated Care Team:    Murray Hodgkins, NP  Christell Faith, PA-C  Marrianne Mood, PA-C

## 2020-01-26 ENCOUNTER — Ambulatory Visit: Payer: Medicare Other | Admitting: Urology

## 2020-01-28 ENCOUNTER — Other Ambulatory Visit: Payer: Self-pay | Admitting: Urology

## 2020-01-28 DIAGNOSIS — N401 Enlarged prostate with lower urinary tract symptoms: Secondary | ICD-10-CM

## 2020-01-30 ENCOUNTER — Other Ambulatory Visit: Payer: Self-pay | Admitting: Urology

## 2020-01-30 DIAGNOSIS — N401 Enlarged prostate with lower urinary tract symptoms: Secondary | ICD-10-CM

## 2020-02-02 ENCOUNTER — Other Ambulatory Visit: Payer: Self-pay | Admitting: Urology

## 2020-02-08 NOTE — Progress Notes (Signed)
Error

## 2020-02-09 ENCOUNTER — Ambulatory Visit (INDEPENDENT_AMBULATORY_CARE_PROVIDER_SITE_OTHER): Payer: Medicare Other | Admitting: Urology

## 2020-02-09 ENCOUNTER — Other Ambulatory Visit: Payer: Self-pay

## 2020-02-09 ENCOUNTER — Encounter: Payer: Self-pay | Admitting: Urology

## 2020-02-09 VITALS — BP 129/78 | HR 69 | Ht 66.0 in | Wt 175.0 lb

## 2020-02-09 DIAGNOSIS — N401 Enlarged prostate with lower urinary tract symptoms: Secondary | ICD-10-CM | POA: Diagnosis not present

## 2020-02-09 DIAGNOSIS — N138 Other obstructive and reflux uropathy: Secondary | ICD-10-CM

## 2020-02-09 LAB — BLADDER SCAN AMB NON-IMAGING: Scan Result: 42

## 2020-02-09 NOTE — Progress Notes (Signed)
02/09/2020 9:39 AM  Austin Warren June 01, 1941 [id number]  Referring provider: Kirk Ruths, MD [facility] [place],   [id number]  Chief Complaint  Patient presents with  . Benign Prostatic Hypertrophy    HPI: Patient is a 79 year old male with BPH with LUTS who presents today for a 1 year follow-up with his wife, [name].    BPH WITH LUTS  (prostate and/or bladder) IPSS score: 7/1 PVR: 42 mL  Previous score: 10/2   Previous PVR: 242 mL   Major complaint(s): None. Denies any dysuria, hematuria or suprapubic pain.   Currently taking: tamsulosin 0.4 mg daily and finasteride 5 mg daily.  His has had cystoscopy on 05/03/2015 and found to have an enlarged prostate, estimated size greater than 100 g, significant trabeculation and left small Hutch diverticuli without masses.    Denies any recent fevers, chills, nausea or vomiting.   IPSS    Row Name 02/09/20 0900         International Prostate Symptom Score   How often have you had the sensation of not emptying your bladder? Less than 1 in 5     How often have you had to urinate less than every two hours? Not at All     How often have you found you stopped and started again several times when you urinated? About half the time     How often have you found it difficult to postpone urination? Less than 1 in 5 times     How often have you had a weak urinary stream? Not at All     How often have you had to strain to start urination? Not at All     How many times did you typically get up at night to urinate? 2 Times     Total IPSS Score 7       Quality of Life due to urinary symptoms   If you were to spend the rest of your life with your urinary condition just the way it is now how would you feel about that? Pleased           Score:  1-7 Mild 8-19 Moderate 20-35 Severe   PMH: Past Medical History:  Diagnosis Date  . Arthritis   . Benign prostatic hypertrophy   . Bursitis  of left shoulder   . CHF (congestive heart failure) ([place])   . Coronary artery disease 08/2019   Moderate multivessel CAD (not hemodynamically significant by CT-FFR)  . Diabetes ([place])   . Dyspnea on exertion 11/01/2011  . Elevated PSA   . History of colon polyps   . History of colon polyps   . Hyperlipidemia   . Hypertension   . Melanoma ([place])    under arm  . Microscopic hematuria   . Osteoarthritis   . Retroperitoneal mass   . Stroke ([place]) 05/2014  . TIA (transient ischemic attack)   . Urinary retention   . UTI (urinary tract infection)   . Ventricular tachycardia (HCC)    RBB/LAHB ideopathic VT, 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    Surgical History: Past Surgical History:  Procedure Laterality Date  . CARDIOVERSION    . CARDIOVERSION N/A 03/07/2018   Procedure: CARDIOVERSION;  Surgeon: Wellington Hampshire, MD;  Location: [facility] ORS;  Service: Cardiovascular;  Laterality: N/A;  . CARDIOVERSION N/A 04/06/2019   Procedure: CARDIOVERSION;  Surgeon: Wellington Hampshire, MD;  Location: [facility] ORS;  Service: Cardiovascular;  Laterality: N/A;  .  CARDIOVERSION N/A 06/02/2019   Procedure: CARDIOVERSION;  Surgeon: Nelva Bush, MD;  Location: [facility] ORS;  Service: Cardiovascular;  Laterality: N/A;  . COLONOSCOPY WITH PROPOFOL N/A 10/17/2015   Procedure: COLONOSCOPY WITH PROPOFOL;  Surgeon: Manya Silvas, MD;  Location: [facility] ENDOSCOPY;  Service: Endoscopy;  Laterality: N/A;  . COLONOSCOPY WITH PROPOFOL N/A 03/17/2019   Procedure: COLONOSCOPY WITH PROPOFOL;  Surgeon: Toledo, Benay Pike, MD;  Location: [facility] ENDOSCOPY;  Service: Gastroenterology;  Laterality: N/A;  . ELECTROPHYSIOLOGIC STUDY N/A 12/07/2015   Procedure: V Tach Ablation;  Surgeon: Will Gangl Leeds, MD;  Location: [place] CV LAB;  Service: Cardiovascular;  Laterality: N/A;  . JOINT REPLACEMENT     R TKR  . RADIOLOGY WITH ANESTHESIA N/A 05/18/2014   Procedure: RADIOLOGY WITH ANESTHESIA;  Surgeon: Rob Hickman, MD;  Location: [place];  Service: Radiology;  Laterality: N/A;  . REPLACEMENT TOTAL KNEE    . REPLACEMENT TOTAL KNEE Left   . VASECTOMY      Home Medications:  Allergies as of 02/09/2020      Reactions   Ciprofloxacin Anaphylaxis   Levofloxacin Anaphylaxis   "throat closes"   Enalapril Maleate Swelling   Angioedema   Metoprolol Swelling   edema   Sulfa Antibiotics Rash, Other (See Comments)   Hypotension      Medication List       Accurate as of February 09, 2020  9:39 AM. If you have any questions, ask your nurse or doctor.        amiodarone 200 MG tablet Commonly known as: PACERONE Take 1 tablet (200 mg) by mouth once daily   apixaban 5 MG Tabs tablet Commonly known as: Eliquis Take 1 tablet (5 mg total) by mouth 2 (two) times daily.   atorvastatin 10 MG tablet Commonly known as: LIPITOR Take 10 mg by mouth every evening.   atorvastatin 10 MG tablet Commonly known as: LIPITOR Take 1 tablet by mouth daily.   carvedilol 3.125 MG tablet Commonly known as: COREG Take 1 tablet (3.125 mg total) by mouth 2 (two) times daily.   docusate sodium 50 MG capsule Commonly known as: COLACE Take 50 mg by mouth 2 (two) times daily.   finasteride 5 MG tablet Commonly known as: PROSCAR TAKE ONE TABLET BY MOUTH EVERY DAY   levothyroxine 100 MCG tablet Commonly known as: SYNTHROID Take 1 tablet (100 mcg total) by mouth daily before breakfast.   mexiletine 150 MG capsule Commonly known as: MEXITIL Take 1 capsule (150 mg total) by mouth 2 (two) times daily.   multivitamin with minerals Tabs tablet Take 1 tablet by mouth daily. One A Day 50+   spironolactone 25 MG tablet Commonly known as: ALDACTONE Take 1 tablet (25 mg total) by mouth daily.   tamsulosin 0.4 MG Caps capsule Commonly known as: FLOMAX TAKE 2 CAPSULES EVERY DAY   torsemide 10 MG tablet Commonly known as: DEMADEX TAKE ONE TAB (10MG ) BY MOUTH ONCE DAILY,THOUGH IF HE GAINS 2 POUNDS IN A DAY OR 5 POUNDS IN A WEEK, HE SHOULD  RETURN TO 20MG  DAILY UNTIL BACK TO BASELINE WEIGHT   vitamin C 500 MG tablet Commonly known as: ASCORBIC ACID Take 500 mg by mouth daily.   Vitamin D3 25 MCG (1000 UT) Caps Take 1,000 Units by mouth daily.       Allergies:  Allergies  Allergen Reactions  . Ciprofloxacin Anaphylaxis  . Levofloxacin Anaphylaxis    "throat closes"  . Enalapril Maleate Swelling    Angioedema  .  Metoprolol Swelling    edema  . Sulfa Antibiotics Rash and Other (See Comments)    Hypotension    Family History: Family History  Problem Relation Age of Onset  . Stroke Father   . Hypertension Father   . Heart attack Neg Hx   . Kidney disease Neg Hx   . Prostate cancer Neg Hx   . Kidney cancer Neg Hx   . Bladder Cancer Neg Hx     Social History:  reports that he quit smoking about 50 years ago. His smoking use included cigarettes. He started smoking about 60 years ago. He has a 20.00 pack-year smoking history. He has never used smokeless tobacco. He reports current alcohol use. He reports that he does not use drugs.  ROS: For pertinent review of systems please refer to history of present illness  Physical Exam: BP (!) 129/78   Pulse 69   Ht 5\' 6"  (1.676 m)   Wt 175 lb (79.4 kg)   BMI 28.25 kg/m   Constitutional:  Well nourished. Alert and oriented, No acute distress. HEENT: [place] AT, moist mucus membranes.  Trachea midline Cardiovascular: No clubbing, cyanosis, or edema. Respiratory: Normal respiratory effort, no increased work of breathing. GI: Abdomen is soft, non tender, non distended, no abdominal masses. Liver and spleen not palpable.  No hernias appreciated.  Stool sample for occult testing is not indicated.   GU: No CVA tenderness.  No bladder fullness or masses.  Patient with uncircumcised phallus. Foreskin easily retracted  Urethral meatus is patent.  No penile discharge. No penile lesions or rashes. Scrotum without lesions, cysts, rashes and/or edema.  Testicles are located scrotally  bilaterally. No masses are appreciated in the testicles. Left and right epididymis are normal. Rectal: Patient with  normal sphincter tone. Anus and perineum without scarring or rashes. No rectal masses are appreciated. Prostate is approximately 60 plus grams, could only palpate apex and midportion of the gland, no nodules are appreciated. Seminal vesicles could not be palpated. Skin: No rashes, bruises or suspicious lesions. Lymph: No inguinal adenopathy. Neurologic: Grossly intact, no focal deficits, moving all 4 extremities. Psychiatric: Normal mood and affect.   Laboratory Data: Lab Results  Component Value Date   WBC 7.7 05/29/2019   HGB 16.2 05/29/2019   HCT 47.0 05/29/2019   MCV 94.0 05/29/2019   PLT 141 (L) 05/29/2019    Lab Results  Component Value Date   CREATININE 0.86 08/19/2019   PSA History:  5.5 ng/mL on 10/00/2005-bx was benign  1.3 ng/mL on 00/00/2013  1.4 ng/mL on 11/26/2012  1.6 ng/mL on 11/26/2013  0.9 ng/mL on 12/10/2014   Lab Results  Component Value Date   HGBA1C 5.2 04/07/2019    Lab Results  Component Value Date   TSH 4.905 (H) 05/29/2019       Component Value Date/Time   CHOL 124 05/19/2014 0510   HDL 38 (L) 05/19/2014 0510   CHOLHDL 3.3 05/19/2014 0510   VLDL 28 05/19/2014 0510   LDLCALC 58 05/19/2014 0510    Lab Results  Component Value Date   AST 28 04/07/2019   Lab Results  Component Value Date   ALT 23 04/07/2019    Results for orders placed or performed in visit on 02/09/20  Bladder Scan (Post Void Residual) in office  Result Value Ref Range   Scan Result 42   I have reviewed the labs.  Assessment & Plan:    1. BPH with LUTS: I PSS score is 7/1 it  is improved Continue tamsulosin 0.4 mg bid and finasteride 5 mg daily RTC in 12 months for IPSS score and exam   Return in about 1 year (around 02/08/2021) for IPSS, PVR and exam.  These notes generated with voice recognition software. I apologize for typographical  errors.  [facility] [address] [place], [place] [address] [phone]  I, Joneen Boers Peace, am acting as a Education administrator for [employer], PA-C  I have reviewed the above documentation for accuracy and completeness, and I agree with the above.    Zara Council, PA-C

## 2020-03-14 ENCOUNTER — Other Ambulatory Visit: Payer: Self-pay | Admitting: Internal Medicine

## 2020-03-15 ENCOUNTER — Other Ambulatory Visit: Payer: Self-pay | Admitting: Internal Medicine

## 2020-03-15 ENCOUNTER — Telehealth: Payer: Self-pay | Admitting: Internal Medicine

## 2020-03-15 NOTE — Telephone Encounter (Signed)
*  STAT* If patient is at the pharmacy, call can be transferred to refill team.   1. Which medications need to be refilled? (please list name of each medication and dose if known)    Amiodarone 200 mg po q d   2. Which pharmacy/location (including street and city if local pharmacy) is medication to be sent to?  [facility]   3. Do they need a 30 day or 90 day supply? [address]

## 2020-03-15 NOTE — Telephone Encounter (Signed)
amiodarone (PACERONE) 200 MG tablet 90 tablet 3 03/15/2020    Sig: TAKE 1 TABLET BY MOUTH DAILY   Sent to pharmacy as: amiodarone (PACERONE) 200 MG tablet   Notes to Pharmacy: PT OUT OF REFILLS. PLEASE SEND NEW RX. [place] [facility]   E-Prescribing Status: Receipt confirmed by pharmacy (03/15/2020  3:27 PM EDT)   [place], [place]

## 2020-05-02 ENCOUNTER — Other Ambulatory Visit: Payer: Self-pay | Admitting: Urology

## 2020-05-02 DIAGNOSIS — N138 Other obstructive and reflux uropathy: Secondary | ICD-10-CM

## 2020-05-12 ENCOUNTER — Other Ambulatory Visit: Payer: Self-pay

## 2020-05-12 ENCOUNTER — Encounter: Payer: Self-pay | Admitting: Internal Medicine

## 2020-05-12 ENCOUNTER — Ambulatory Visit: Payer: Medicare Other | Admitting: Internal Medicine

## 2020-05-12 VITALS — BP 126/62 | HR 69 | Ht 66.0 in | Wt 176.0 lb

## 2020-05-12 DIAGNOSIS — I472 Ventricular tachycardia, unspecified: Secondary | ICD-10-CM

## 2020-05-12 DIAGNOSIS — I5022 Chronic systolic (congestive) heart failure: Secondary | ICD-10-CM

## 2020-05-12 DIAGNOSIS — I428 Other cardiomyopathies: Secondary | ICD-10-CM | POA: Diagnosis not present

## 2020-05-12 DIAGNOSIS — I4819 Other persistent atrial fibrillation: Secondary | ICD-10-CM

## 2020-05-12 DIAGNOSIS — Z79899 Other long term (current) drug therapy: Secondary | ICD-10-CM

## 2020-05-12 MED ORDER — SPIRONOLACTONE 25 MG PO TABS: 25.0000 mg | ORAL_TABLET | Freq: Every day | ORAL | 3 refills | Status: DC

## 2020-05-12 NOTE — Progress Notes (Signed)
Patient Care Team: Kirk Ruths, MD as PCP - General (Unknown Physician Specialty)   HPI  Austin Warren is a 79 y.o. male Seen in followup for a ventricular tachycardia with a right bundle superior axis relatively narrow QRS complex and positive concordance suggesting a septal origin and possible verapamil sensitivity.    Catheterization >> normal coronary arteries and normal left ventricular function. ECG was notable for marked reduction in voltage   Signal average Electrocardiogram was markedly abnormal.  MRI 4/16>>There is no significant change from the prior study with persistent basal inferior and inferolateral late gadolinium enhancement slightly more pronounced on the current study and a small focal late gadolinium enhancement at the point of inferior RV attachment to the LV wall.  He also underwent fat pad biopsy>> neg  ntricular hypertrophy with an ejection fraction of 45-50% with hypokinesis of the inferior apical myocardium     He had recurrent ventricular tachycardia. He was put on verapamil.  5/17 he was transferred to [facility] in [place]. Evaluation included an echo with EF of 40-45%. Cardiac MR demonstrated EF of 43% with inferolateral and apical hypokinesis with inferolateral basilar scar which was if anything not worse compared to prior MR 4/16. He underwent EP testing. No scar was found and no ablation undertaken. Ventricular flutter was inducible. VT  was not. There have been discussions regarding ICD implantation. He elected to defer that decision  Discharge off amiod 2/2 bradycardi Had recurrent tachycardia >>>  started on mexiletine and resumed amiodarone after hiatus of years -- amiodarone has been on and off because of bradycardia     He  had  stroke  11/15. He was found to be in atrial fibrillation. He was treated with TPA. Apixaban was initiated.  He has intellectual consequences and aphasia; these are improving  The patient denies chest pain, shortness of  breath, nocturnal dyspnea, orthopnea or peripheral edema.  There have been no palpitations, lightheadedness or syncope.   Patient denies symptoms of GI intolerance, sun sensitivity, neurological symptoms or cough attributable to amiodarone   DATE TEST EF   4/12 LHC  No obst CAD  5/17 cMRI 43% Inferolateral subendo scar  2/21 CTA   Ca score 881 LADm-severe--FFR> 0.8  2/21 Echo  30-35%        Date Cr K TSH Hgb LFTs PFTs  6/17  0.94 3.9 2.8  25    8/18 0.9 4.0 7.5  16   1/19   4.99     2/19 0.8 4.0 3.9  18   8/19 0.86 4.6  15.5    2/20 0.9 4.0 4.87  22   10/20 1.08 4.7 6.7 16.5 23   3/21 0.9 4.4 4.55  21   9/21 0.8 4.5 5.22  21      DATE Rate PR interval QRSduration Dose-Amio  10/18 62  162 112 200  1/18 54 212 112 200  1/19  66  206  104  200 x 5/w  12/19 58 220 106 200 x 5/w  5/21 57 180 102 200 d  10/21 69 220 96 200 d          Thromboembolic risk factors ( age  -2, HTN-1, TIA/CVA-2, Vasc disease -1, CHF-1) for a CHADSVASc Score of >=7    Past Medical History:  Diagnosis Date  . Arthritis   . Benign prostatic hypertrophy   . Bursitis of left shoulder   . CHF (congestive heart failure) ([place])   . Coronary artery disease  08/2019   Moderate multivessel CAD (not hemodynamically significant by CT-FFR)  . Diabetes ([place])   . Dyspnea on exertion 11/01/2011  . Elevated PSA   . History of colon polyps   . History of colon polyps   . Hyperlipidemia   . Hypertension   . Melanoma ([place])    under arm  . Microscopic hematuria   . Osteoarthritis   . Retroperitoneal mass   . Stroke ([place]) 05/2014  . TIA (transient ischemic attack)   . Urinary retention   . UTI (urinary tract infection)   . Ventricular tachycardia (HCC)    RBB/LAHB ideopathic VT, 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    Past Surgical History:  Procedure Laterality Date  . CARDIOVERSION    . CARDIOVERSION N/A 03/07/2018   Procedure: CARDIOVERSION;  Surgeon: Wellington Hampshire, MD;  Location: [facility] ORS;  Service:  Cardiovascular;  Laterality: N/A;  . CARDIOVERSION N/A 04/06/2019   Procedure: CARDIOVERSION;  Surgeon: Wellington Hampshire, MD;  Location: [place] ORS;  Service: Cardiovascular;  Laterality: N/A;  . CARDIOVERSION N/A 06/02/2019   Procedure: CARDIOVERSION;  Surgeon: Nelva Bush, MD;  Location: [facility] ORS;  Service: Cardiovascular;  Laterality: N/A;  . COLONOSCOPY WITH PROPOFOL N/A 10/17/2015   Procedure: COLONOSCOPY WITH PROPOFOL;  Surgeon: Manya Silvas, MD;  Location: [facility] ENDOSCOPY;  Service: Endoscopy;  Laterality: N/A;  . COLONOSCOPY WITH PROPOFOL N/A 03/17/2019   Procedure: COLONOSCOPY WITH PROPOFOL;  Surgeon: Toledo, Benay Pike, MD;  Location: [facility] ENDOSCOPY;  Service: Gastroenterology;  Laterality: N/A;  . ELECTROPHYSIOLOGIC STUDY N/A 12/07/2015   Procedure: V Tach Ablation;  Surgeon: Will Speciale Leeds, MD;  Location: [place] CV LAB;  Service: Cardiovascular;  Laterality: N/A;  . JOINT REPLACEMENT     R TKR  . RADIOLOGY WITH ANESTHESIA N/A 05/18/2014   Procedure: RADIOLOGY WITH ANESTHESIA;  Surgeon: Rob Hickman, MD;  Location: [place];  Service: Radiology;  Laterality: N/A;  . REPLACEMENT TOTAL KNEE    . REPLACEMENT TOTAL KNEE Left   . VASECTOMY      Current Outpatient Medications  Medication Sig Dispense Refill  . amiodarone (PACERONE) 200 MG tablet TAKE 1 TABLET BY MOUTH DAILY 90 tablet 3  . apixaban (ELIQUIS) 5 MG TABS tablet Take 1 tablet (5 mg total) by mouth 2 (two) times daily. 180 tablet 3  . Ascorbic Acid (VITAMIN C) 500 MG tablet Take 500 mg by mouth daily.      Marland Kitchen atorvastatin (LIPITOR) 10 MG tablet Take 1 tablet by mouth daily.    . carvedilol (COREG) 3.125 MG tablet Take 1 tablet (3.125 mg total) by mouth 2 (two) times daily. 180 tablet 3  . Cholecalciferol (VITAMIN D3) 1000 UNITS CAPS Take 1,000 Units by mouth daily.     Marland Kitchen docusate sodium (COLACE) 50 MG capsule Take 50 mg by mouth 2 (two) times daily.    . finasteride (PROSCAR) 5 MG tablet TAKE ONE TABLET BY  MOUTH EVERY DAY 90 tablet 3  . levothyroxine (SYNTHROID) 100 MCG tablet Take 1 tablet (100 mcg total) by mouth daily before breakfast. 30 tablet 2  . mexiletine (MEXITIL) 150 MG capsule Take 1 capsule (150 mg total) by mouth 2 (two) times daily. 180 capsule 3  . Multiple Vitamin (MULTIVITAMIN WITH MINERALS) TABS tablet Take 1 tablet by mouth daily. One A Day 50+    . spironolactone (ALDACTONE) 25 MG tablet Take 1 tablet (25 mg total) by mouth daily. 90 tablet 3  . tamsulosin (FLOMAX) 0.4 MG CAPS capsule TAKE 2  CAPSULES EVERY DAY 180 capsule 3  . torsemide (DEMADEX) 10 MG tablet TAKE ONE TAB (10MG ) BY MOUTH ONCE DAILY,THOUGH IF HE GAINS 2 POUNDS IN A DAY OR 5 POUNDS IN A WEEK, HE SHOULD RETURN TO 20MG  DAILY UNTIL BACK TO BASELINE WEIGHT 90 tablet 3   No current facility-administered medications for this visit.    Allergies  Allergen Reactions  . Ciprofloxacin Anaphylaxis  . Levofloxacin Anaphylaxis    "throat closes"  . Enalapril Maleate Swelling    Angioedema  . Metoprolol Swelling    edema  . Sulfa Antibiotics Rash and Other (See Comments)    Hypotension    Review of Systems negative except from HPI and PMH  Physical Exam BP 126/62 (BP Location: Left Arm, Patient Position: Sitting, Cuff Size: Normal)   Pulse 69   Ht 5\' 6"  (1.676 m)   Wt 176 lb (79.8 kg)   SpO2 96%   BMI 28.41 kg/m  Well developed and nourished in no acute distress HENT normal Neck supple with JVP-  flat   Clear Regular rate and rhythm, no murmurs or gallops Abd-soft with active BS No Clubbing cyanosis edema Skin-warm and dry A & Oriented  Grossly normal sensory and motor function  ECG sinus  @ 69 22/10/41    Assessment and  Plan  Ventricular tachycardia  Low-voltage ECG  Cardiomyopathy-nonischemic abnormal signal average ECG/MRI negative fat pad biopsy  Atrial fibrillation-persistent   Stroke   Sinus bradycardia  Hypothyroidism-iatrogenic-treated  High Risk Medication Surveillance  amiodarone   Tolerating amio and holding sinus  Continue and mex  Amio labs within range 9/21   On Anticoagulation;  No bleeding issues   Will check CBC  Euvolemic continue current meds  No intercurrent atrial fibrillation or flutter     Maintaining sinus  Continue amio  Tolerating amio  Lengthy discussion re his meds and decreasing or stopping-- he and his wife is inclined to "leave things alone as he is doing well"  Hard to argue with it.  Reviewing the time sequence, the mex had failed to be sufficient by itself and amio has repeatedly been effective  Surveillance labs ok  No intercurrent atrial fibrillation or flutter  On Anticoagulation;  No bleeding issues   Euvolemic continue current meds  More than 50% of 45 min was spent in counseling related to the above

## 2020-05-12 NOTE — Patient Instructions (Signed)
Medication Instructions:  - Your physician recommends that you continue on your current medications as directed. Please refer to the Current Medication list given to you today.  *If you need a refill on your cardiac medications before your next appointment, please call your pharmacy*   Lab Work: - Your physician recommends that you have lab work today: CBC  If you have labs (blood work) drawn today and your tests are completely normal, you will receive your results only by: Marland Kitchen MyChart Message (if you have MyChart) OR . A paper copy in the mail If you have any lab test that is abnormal or we need to change your treatment, we will call you to review the results.   Testing/Procedures: - none ordered   Follow-Up: At [facility], you and your health needs are our priority.  As part of our continuing mission to provide you with exceptional heart care, we have created designated Provider Care Teams.  These Care Teams include your primary Cardiologist (physician) and Advanced Practice Providers (APPs -  Physician Assistants and Nurse Practitioners) who all work together to provide you with the care you need, when you need it.  We recommend signing up for the patient portal called "MyChart".  Sign up information is provided on this After Visit Summary.  MyChart is used to connect with patients for Virtual Visits (Telemedicine).  Patients are able to view lab/test results, encounter notes, upcoming appointments, etc.  Non-urgent messages can be sent to your provider as well.   To learn more about what you can do with MyChart, go to [email or web address].    Your next appointment:   1) in 6 months with Dr. Saunders Revel  2) in 1 year with Dr. Caryl Comes  The format for your next appointment:   In Person  Provider:   As above   Other Instructions n/a

## 2020-05-13 LAB — CBC WITH DIFFERENTIAL/PLATELET
Basophils Absolute: 0.1 10*3/uL (ref 0.0–0.2)
Basos: 1 %
EOS (ABSOLUTE): 0.2 10*3/uL (ref 0.0–0.4)
Eos: 2 %
Hematocrit: 42.8 % (ref 37.5–51.0)
Hemoglobin: 14.7 g/dL (ref 13.0–17.7)
Immature Grans (Abs): 0.1 10*3/uL (ref 0.0–0.1)
Immature Granulocytes: 1 %
Lymphocytes Absolute: 1 10*3/uL (ref 0.7–3.1)
Lymphs: 11 %
MCH: 33.6 pg — ABNORMAL HIGH (ref 26.6–33.0)
MCHC: 34.3 g/dL (ref 31.5–35.7)
MCV: 98 fL — ABNORMAL HIGH (ref 79–97)
Monocytes Absolute: 0.8 10*3/uL (ref 0.1–0.9)
Monocytes: 9 %
Neutrophils Absolute: 6.7 10*3/uL (ref 1.4–7.0)
Neutrophils: 76 %
Platelets: 184 10*3/uL (ref 150–450)
RBC: 4.38 x10E6/uL (ref 4.14–5.80)
RDW: 11.6 % (ref 11.6–15.4)
WBC: 8.8 10*3/uL (ref 3.4–10.8)

## 2020-08-10 ENCOUNTER — Ambulatory Visit: Payer: Medicare Other | Admitting: Internal Medicine

## 2020-11-09 ENCOUNTER — Ambulatory Visit: Payer: Medicare Other | Admitting: Internal Medicine

## 2020-11-09 ENCOUNTER — Other Ambulatory Visit: Payer: Self-pay

## 2020-11-09 ENCOUNTER — Encounter: Payer: Self-pay | Admitting: Internal Medicine

## 2020-11-09 VITALS — BP 126/80 | HR 62 | Ht 66.0 in | Wt 179.0 lb

## 2020-11-09 DIAGNOSIS — I4819 Other persistent atrial fibrillation: Secondary | ICD-10-CM | POA: Diagnosis not present

## 2020-11-09 DIAGNOSIS — I428 Other cardiomyopathies: Secondary | ICD-10-CM

## 2020-11-09 DIAGNOSIS — I480 Paroxysmal atrial fibrillation: Secondary | ICD-10-CM

## 2020-11-09 DIAGNOSIS — I472 Ventricular tachycardia, unspecified: Secondary | ICD-10-CM

## 2020-11-09 DIAGNOSIS — I5022 Chronic systolic (congestive) heart failure: Secondary | ICD-10-CM

## 2020-11-09 DIAGNOSIS — I2510 Atherosclerotic heart disease of native coronary artery without angina pectoris: Secondary | ICD-10-CM

## 2020-11-09 NOTE — Progress Notes (Signed)
Follow-up Outpatient Visit Date: 11/09/2020  Primary Care Provider: Kirk Ruths, MD [place] [address]  Chief Complaint: Follow-up HFrEF and nonobstructive CAD  HPI:  Austin Warren is a 80 y.o. male with history of nonobstructive coronary artery disease, persistent atrial fibrillation/flutter, recurrent ventricular tachycardia, chronic HFrEF due to nonischemic cardiomyopathy, hypertension, hyperlipidemia, stroke, and hypothyroidism, who presents for follow-up of heart failure.  He was last seen in our office in 12/2019 by Lorenso Quarry, PA, at which time he was feeling relatively well.  He subsequently saw Dr. Caryl Comes in 04/2020, at which time he was doing well.  No medication changes were made, continuing amiodarone, mexiletine, and apixaban.  Today, Austin Warren reports that he is feeling quite well, denying chest pain, shortness of breath, palpitations, lightheadedness, and edema.  He is fairly sedentary due to some residual weakness from his remote stroke.  He does go to the fitness center at times and rides a stationary bike.  He fell 2 to 3 months ago while reaching for something but did not injure himself.  He has not had any significant bleeding.  He remains compliant with his medications.  --------------------------------------------------------------------------------------------------  Past Medical History:  Diagnosis Date  . Arthritis   . Benign prostatic hypertrophy   . Bursitis of left shoulder   . CHF (congestive heart failure) ([place])   . Coronary artery disease 08/2019   Moderate multivessel CAD (not hemodynamically significant by CT-FFR)  . Diabetes ([place])   . Dyspnea on exertion 11/01/2011  . Elevated PSA   . History of colon polyps   . History of colon polyps   . Hyperlipidemia   . Hypertension   . Melanoma ([place])    under arm  . Microscopic hematuria   . Osteoarthritis   . Retroperitoneal mass   . Stroke  ([place]) 05/2014  . TIA (transient ischemic attack)   . Urinary retention   . UTI (urinary tract infection)   . Ventricular tachycardia (HCC)    RBB/LAHB ideopathic VT, 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   Past Surgical History:  Procedure Laterality Date  . CARDIOVERSION    . CARDIOVERSION N/A 03/07/2018   Procedure: CARDIOVERSION;  Surgeon: Wellington Hampshire, MD;  Location: [facility] ORS;  Service: Cardiovascular;  Laterality: N/A;  . CARDIOVERSION N/A 04/06/2019   Procedure: CARDIOVERSION;  Surgeon: Wellington Hampshire, MD;  Location: [place] [facility];  Service: Cardiovascular;  Laterality: N/A;  . CARDIOVERSION N/A 06/02/2019   Procedure: CARDIOVERSION;  Surgeon: Nelva Bush, MD;  Location: [facility] ORS;  Service: Cardiovascular;  Laterality: N/A;  . COLONOSCOPY WITH PROPOFOL N/A 10/17/2015   Procedure: COLONOSCOPY WITH PROPOFOL;  Surgeon: Manya Silvas, MD;  Location: [facility] ENDOSCOPY;  Service: Endoscopy;  Laterality: N/A;  . COLONOSCOPY WITH PROPOFOL N/A 03/17/2019   Procedure: COLONOSCOPY WITH PROPOFOL;  Surgeon: Toledo, Benay Pike, MD;  Location: [facility] ENDOSCOPY;  Service: Gastroenterology;  Laterality: N/A;  . ELECTROPHYSIOLOGIC STUDY N/A 12/07/2015   Procedure: V Tach Ablation;  Surgeon: Will Shillingford Leeds, MD;  Location: [place] CV LAB;  Service: Cardiovascular;  Laterality: N/A;  . JOINT REPLACEMENT     R TKR  . RADIOLOGY WITH ANESTHESIA N/A 05/18/2014   Procedure: RADIOLOGY WITH ANESTHESIA;  Surgeon: Rob Hickman, MD;  Location: [place];  Service: Radiology;  Laterality: N/A;  . REPLACEMENT TOTAL KNEE    . REPLACEMENT TOTAL KNEE Left   . VASECTOMY      Current Meds  Medication Sig  .  amiodarone (PACERONE) 200 MG tablet TAKE 1 TABLET BY MOUTH DAILY  . apixaban (ELIQUIS) 5 MG TABS tablet Take 1 tablet (5 mg total) by mouth 2 (two) times daily.  . Ascorbic Acid (VITAMIN C) 500 MG tablet Take 500 mg by mouth daily.    Marland Kitchen atorvastatin (LIPITOR) 10 MG tablet Take 1 tablet by mouth daily.  .  carvedilol (COREG) 3.125 MG tablet Take 1 tablet (3.125 mg total) by mouth 2 (two) times daily.  . Cholecalciferol (VITAMIN D3) 1000 UNITS CAPS Take 1,000 Units by mouth daily.   Marland Kitchen docusate sodium (COLACE) 50 MG capsule Take 50 mg by mouth 2 (two) times daily.  . finasteride (PROSCAR) 5 MG tablet TAKE ONE TABLET BY MOUTH EVERY DAY  . levothyroxine (SYNTHROID) 100 MCG tablet Take 1 tablet (100 mcg total) by mouth daily before breakfast.  . mexiletine (MEXITIL) 150 MG capsule Take 1 capsule (150 mg total) by mouth 2 (two) times daily.  . Multiple Vitamin (MULTIVITAMIN WITH MINERALS) TABS tablet Take 1 tablet by mouth daily. One A Day 50+  . spironolactone (ALDACTONE) 25 MG tablet Take 1 tablet (25 mg total) by mouth daily.  . tamsulosin (FLOMAX) 0.4 MG CAPS capsule TAKE 2 CAPSULES EVERY DAY  . torsemide (DEMADEX) 10 MG tablet TAKE ONE TAB (10MG ) BY MOUTH ONCE DAILY,THOUGH IF HE GAINS 2 POUNDS IN A DAY OR 5 POUNDS IN A WEEK, HE SHOULD RETURN TO 20MG  DAILY UNTIL BACK TO BASELINE WEIGHT    Allergies: Ciprofloxacin, Levofloxacin, Enalapril maleate, Metoprolol, and Sulfa antibiotics  Social History   Tobacco Use  . Smoking status: Former Smoker    Packs/day: 2.00    Years: 10.00    Pack years: 20.00    Types: Cigarettes    Start date: 02/10/1960    Quit date: 02/09/1970    Years since quitting: 50.7  . Smokeless tobacco: Never Used  . Tobacco comment: 05/2014    quit smoking  45 years ago  Vaping Use  . Vaping Use: Never used  Substance Use Topics  . Alcohol use: Yes    Comment: 4-5 beers a day several days per week  . Drug use: No    Family History  Problem Relation Age of Onset  . Stroke Father   . Hypertension Father   . Heart attack Neg Hx   . Kidney disease Neg Hx   . Prostate cancer Neg Hx   . Kidney cancer Neg Hx   . Bladder Cancer Neg Hx     Review of Systems: A 12-system review of systems was performed and was negative except as noted in the  HPI.  --------------------------------------------------------------------------------------------------  Physical Exam: BP 126/80 (BP Location: Left Arm, Patient Position: Sitting, Cuff Size: Normal)   Pulse 62   Ht 5\' 6"  (1.676 m)   Wt 179 lb (81.2 kg)   SpO2 97%   BMI 28.89 kg/m   General:  NAD. Neck: No JVD or HJR. Lungs: Clear to auscultation bilaterally without wheezes or crackles. Heart: Distant heart sounds.  Regular rate and rhythm without murmurs, rubs, or gallops. Abdomen: Soft, nontender, nondistended. Extremities: No lower extremity edema.  EKG: Normal sinus rhythm with first-degree AV block (PR interval 234 ms) and low voltage.  No significant change from prior tracing on 05/12/2020.  Lab Results  Component Value Date   WBC 8.8 05/12/2020   HGB 14.7 05/12/2020   HCT 42.8 05/12/2020   MCV 98 (H) 05/12/2020   PLT 184 05/12/2020    Lab Results  Component Value Date   NA 141 08/19/2019   K 4.6 08/19/2019   CL 103 08/19/2019   CO2 18 (L) 08/19/2019   BUN 17 08/19/2019   CREATININE 0.86 08/19/2019   GLUCOSE 99 08/19/2019   ALT 23 04/07/2019    Lab Results  Component Value Date   CHOL 124 05/19/2014   HDL 38 (L) 05/19/2014   LDLCALC 58 05/19/2014   TRIG 142 05/19/2014   CHOLHDL 3.3 05/19/2014    --------------------------------------------------------------------------------------------------  ASSESSMENT AND PLAN: Chronic HFrEF due to nonischemic cardiomyopathy: Austin Warren appears euvolemic with stable NYHA class II symptoms.  We will continue his current GDMT consisting of carvedilol 3.125 mg twice daily and spironolactone 25 mg daily as well as torsemide 10 mg daily.  Low normal resting heart rate and first-degree AV block precludes escalation of carvedilol.  He is not on an ACE inhibitor or ARB due to history of angioedema with enalapril.  We will plan to repeat an echo at Austin Warren convenience to assess for improvement in his LVEF, which was  noted to be 30-35% on most recent assessment in 08/2019.  Labs through Dr. Tonette Bihari office in January showed stable renal function and electrolytes.  He is scheduled for repeat labs with Dr. Ouida Sills next month.  Nonobstructive coronary artery disease: No symptoms to suggest worsening coronary insufficiency.  LDL well controlled on last check in 05/2020.  Continue atorvastatin 10 mg daily as well as apixaban 5 mg twice daily in lieu of aspirin with history of atrial fibrillation.  Persistent atrial fibrillation: Austin Warren is maintaining sinus rhythm today.  He should continue amiodarone and apixaban with ongoing follow-up with Dr. Caryl Comes.  Ventricular tachycardia: No symptoms to suggest recurrent VTE.  Continue current doses of amiodarone, carvedilol, and mexiletine, with ongoing follow-up with Dr. Caryl Comes.  Follow-up: Return to clinic in 6 months to see Dr. Caryl Comes; follow-up with me in 1 year.  Nelva Bush, MD 11/10/2020 7:22 AM

## 2020-11-09 NOTE — Patient Instructions (Signed)
Medication Instructions:  Your physician recommends that you continue on your current medications as directed. Please refer to the Current Medication list given to you today. *If you need a refill on your cardiac medications before your next appointment, please call your pharmacy*   Lab Work: None ordered  If you have labs (blood work) drawn today and your tests are completely normal, you will receive your results only by: Marland Kitchen MyChart Message (if you have MyChart) OR . A paper copy in the mail If you have any lab test that is abnormal or we need to change your treatment, we will call you to review the results.   Testing/Procedures: Your physician has requested that you have an echocardiogram. Echocardiography is a painless test that uses sound waves to create images of your heart. It provides your doctor with information about the size and shape of your heart and how well your heart's chambers and valves are working. This procedure takes approximately one hour. There are no restrictions for this procedure.  Follow-Up: At [facility], you and your health needs are our priority.  As part of our continuing mission to provide you with exceptional heart care, we have created designated Provider Care Teams.  These Care Teams include your primary Cardiologist (physician) and Advanced Practice Providers (APPs -  Physician Assistants and Nurse Practitioners) who all work together to provide you with the care you need, when you need it.  We recommend signing up for the patient portal called "MyChart".  Sign up information is provided on this After Visit Summary.  MyChart is used to connect with patients for Virtual Visits (Telemedicine).  Patients are able to view lab/test results, encounter notes, upcoming appointments, etc.  Non-urgent messages can be sent to your provider as well.   To learn more about what you can do with MyChart, go to [email or web address].    Your next appointment:   1  year(s)  The format for your next appointment:   In Person  Provider:   You may see Dr. Harrell Gave End or one of the following Advanced Practice Providers on your designated Care Team:    Murray Hodgkins, NP  Christell Faith, PA-C  Marrianne Mood, PA-C  Cadence [place], [place]  Laurann Montana, NP

## 2020-11-10 ENCOUNTER — Encounter: Payer: Self-pay | Admitting: Internal Medicine

## 2020-11-10 DIAGNOSIS — I428 Other cardiomyopathies: Secondary | ICD-10-CM | POA: Insufficient documentation

## 2020-11-28 ENCOUNTER — Other Ambulatory Visit: Payer: Self-pay | Admitting: Internal Medicine

## 2020-11-29 ENCOUNTER — Other Ambulatory Visit: Payer: Self-pay

## 2020-11-29 ENCOUNTER — Ambulatory Visit (INDEPENDENT_AMBULATORY_CARE_PROVIDER_SITE_OTHER): Payer: Medicare Other

## 2020-11-29 DIAGNOSIS — I5022 Chronic systolic (congestive) heart failure: Secondary | ICD-10-CM | POA: Diagnosis not present

## 2020-11-29 LAB — ECHOCARDIOGRAM COMPLETE
AR max vel: 2.09 cm2
AV Area VTI: 2.25 cm2
AV Area mean vel: 2.38 cm2
AV Mean grad: 2 mmHg
AV Peak grad: 5.2 mmHg
Ao pk vel: 1.14 m/s
Area-P 1/2: 4.57 cm2
MV M vel: 4.7 m/s
MV Peak grad: 88.4 mmHg
P 1/2 time: 704 msec
S' Lateral: 4.7 cm
Single Plane A4C EF: 40.4 %

## 2020-11-30 ENCOUNTER — Telehealth: Payer: Self-pay

## 2020-11-30 NOTE — Telephone Encounter (Signed)
Able to reach pt's wife Austin Warren (DPR approved) regarding Austin Warren's recent echo, Dr. Saunders Revel had a chance to review his results and advised   "Please let Austin Warren know that his echocardiogram shows that his heart remains somewhat week (LVEF 40-45%), though is has improved since 08/2019 (LVEF 30-35% at that time). I recommend that he continue his current medications and follow-up as previously arranged."  Austin Warren very pleased with current echo results and slight improvement from her husbands echo last year. Will follow-up as schedule, all questions and concerns were address and no additional concerns at this time.

## 2020-12-12 ENCOUNTER — Other Ambulatory Visit: Payer: Self-pay | Admitting: Internal Medicine

## 2020-12-13 NOTE — Telephone Encounter (Signed)
Refill request

## 2020-12-13 NOTE — Telephone Encounter (Signed)
Pt's age 80, wt 81.2kg, SCr 0.8, CrCl 81.76, last ov w/ CE 11/09/20.

## 2020-12-18 ENCOUNTER — Ambulatory Visit
Admission: EM | Admit: 2020-12-18 | Discharge: 2020-12-18 | Disposition: A | Discharge status: Home / Self Care | Payer: Medicare Other | Attending: Family Medicine | Admitting: Family Medicine

## 2020-12-18 ENCOUNTER — Encounter: Payer: Self-pay | Admitting: Emergency Medicine

## 2020-12-18 ENCOUNTER — Other Ambulatory Visit: Payer: Self-pay

## 2020-12-18 DIAGNOSIS — R310 Gross hematuria: Secondary | ICD-10-CM | POA: Insufficient documentation

## 2020-12-18 LAB — POCT URINALYSIS DIP (DEVICE)
Glucose, UA: 100 mg/dL — AB
Ketones, ur: 40 mg/dL — AB
Nitrite: NEGATIVE
Protein, ur: >=300 mg/dL — AB
Specific Gravity, Urine: 1.01 (ref 1.005–1.030)
Urobilinogen, UA: >=8 mg/dL (ref 0.0–1.0)
pH: 8.5 — ABNORMAL HIGH (ref 5.0–8.0)

## 2020-12-18 MED ORDER — CEPHALEXIN 500 MG PO CAPS: 500.0000 mg | ORAL_CAPSULE | Freq: Two times a day (BID) | ORAL | 0 refills | Status: DC

## 2020-12-18 NOTE — ED Provider Notes (Signed)
[facility]    CSN: [id number] Arrival date & time: 12/18/20  9371  History   Chief Complaint Chief Complaint  Patient presents with  . Hematuria   HPI  80 year old male presents with gross hematuria.  Started yesterday.  He reports gross hematuria.  He reports that he has some lower urinary tract symptoms although this is at his baseline.  He has known BPH.  Endorses compliance with his home medications.  He is not currently on Eliquis.  Denies flank pain.  He does note some pressure in his lower abdomen.  No relieving factors.  No other complaints.  Past Medical History:  Diagnosis Date  . Arthritis   . Benign prostatic hypertrophy   . Bursitis of left shoulder   . CHF (congestive heart failure) ([place])   . Coronary artery disease 08/2019   Moderate multivessel CAD (not hemodynamically significant by CT-FFR)  . Diabetes ([place])   . Dyspnea on exertion 11/01/2011  . Elevated PSA   . History of colon polyps   . History of colon polyps   . Hyperlipidemia   . Hypertension   . Melanoma ([place])    under arm  . Microscopic hematuria   . Osteoarthritis   . Retroperitoneal mass   . Stroke ([place]) 05/2014  . TIA (transient ischemic attack)   . Urinary retention   . UTI (urinary tract infection)   . Ventricular tachycardia (HCC)    RBB/LAHB ideopathic VT, 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    Patient Active Problem List   Diagnosis Date Noted  . Nonischemic cardiomyopathy ([place]) 11/10/2020  . Coronary artery disease involving native coronary artery of native heart without angina pectoris 10/14/2019  . Atrial flutter ([place]) 06/18/2019  . Hypothyroidism 06/18/2019  . History of ventricular tachycardia 04/14/2019  . Chronic systolic heart failure ([place]) 04/07/2019  . BPH with obstruction/lower urinary tract symptoms 12/18/2015  . Ventricular tachyarrhythmia ([place]) 12/01/2015  . VT (ventricular tachycardia) ([place]) 12/01/2015  . Arterial hypotension   . Urine retention   . Malaise  and fatigue   . Encounter for general adult medical examination without abnormal findings 03/10/2015  . Arthritis 03/07/2015  . Bilateral renal cysts 02/14/2015  . 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 Amyloidosis ([place])   . Type 2 diabetes mellitus with other circulatory complications ([place]) 69/67/8938  . HLD (hyperlipidemia) 07/02/2014  . Ischemic stroke ([place]) 06/21/2014  . Atrial fibrillation, chronic ([place]) 06/19/2014  . 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 Stroke ([place])   . Atrial fibrillation ([place]) 05/18/2014  . CVA (cerebral vascular accident) ([place]) 05/18/2014  . Essential hypertension 05/18/2014  . Diabetes mellitus ([place]) 05/18/2014  . A-fib ([place]) 05/18/2014  . Cerebral vascular accident ([place]) 05/18/2014  . Cerebrovascular accident (CVA) ([place]) 05/18/2014  . Acute ischemic stroke ([place])   . Benign prostatic hyperplasia with urinary obstruction 12/19/2013  . Diabetes ([place]) 12/19/2013  . Type 2 diabetes mellitus with other specified complication ([place]) 05/01/5101  . Retroperitoneal mass 12/19/2013  . Type 2 diabetes mellitus ([place]) 12/19/2013  . low-voltage ECG 03/05/2013  . Hypersomnolence 03/05/2013  . Morbid obesity ([place]) 03/05/2013  . Obesity (BMI 30-39.9) 03/05/2013  . Difficulty staying awake 03/05/2013  . Abnormal prostate specific antigen 11/28/2012  . Elevated prostate specific antigen (PSA) 11/28/2012  . Dyspnea on exertion 11/01/2011  . Edema 11/01/2011  . Ventricular tachycardia ([place]) 12/01/2010  . Abnormal  signal averaged electrocardiogram 12/01/2010  . Idiopathic ventricular tachycardia ([place]) 12/01/2010    Past Surgical History:  Procedure Laterality Date  . CARDIOVERSION    . CARDIOVERSION N/A 03/07/2018   Procedure: CARDIOVERSION;  Surgeon: Wellington Hampshire, MD;  Location: [facility] ORS;  Service: Cardiovascular;  Laterality: N/A;  . CARDIOVERSION N/A 04/06/2019   Procedure: CARDIOVERSION;  Surgeon: Wellington Hampshire, MD;  Location:  [place] ORS;  Service: Cardiovascular;  Laterality: N/A;  . CARDIOVERSION N/A 06/02/2019   Procedure: CARDIOVERSION;  Surgeon: Nelva Bush, MD;  Location: [facility] ORS;  Service: Cardiovascular;  Laterality: N/A;  . COLONOSCOPY WITH PROPOFOL N/A 10/17/2015   Procedure: COLONOSCOPY WITH PROPOFOL;  Surgeon: Manya Silvas, MD;  Location: [facility] ENDOSCOPY;  Service: Endoscopy;  Laterality: N/A;  . COLONOSCOPY WITH PROPOFOL N/A 03/17/2019   Procedure: COLONOSCOPY WITH PROPOFOL;  Surgeon: Toledo, Benay Pike, MD;  Location: [facility] ENDOSCOPY;  Service: Gastroenterology;  Laterality: N/A;  . ELECTROPHYSIOLOGIC STUDY N/A 12/07/2015   Procedure: V Tach Ablation;  Surgeon: Will Seibert Leeds, MD;  Location: [place] [facility] LAB;  Service: Cardiovascular;  Laterality: N/A;  . JOINT REPLACEMENT     R TKR  . RADIOLOGY WITH ANESTHESIA N/A 05/18/2014   Procedure: RADIOLOGY WITH ANESTHESIA;  Surgeon: Rob Hickman, MD;  Location: [place];  Service: Radiology;  Laterality: N/A;  . REPLACEMENT TOTAL KNEE    . REPLACEMENT TOTAL KNEE Left   . VASECTOMY         Home Medications    Prior to Admission medications   Medication Sig Start Date End Date Taking? Authorizing Provider  amiodarone (PACERONE) 200 MG tablet TAKE 1 TABLET BY MOUTH DAILY 03/15/20  Yes Deboraha Sprang, MD  Ascorbic Acid (VITAMIN C) 500 MG tablet Take 500 mg by mouth daily.     Yes [provider]  atorvastatin (LIPITOR) 10 MG tablet Take 1 tablet by mouth daily. 01/12/20  Yes [provider]  carvedilol (COREG) 3.125 MG tablet TAKE 1 TABLET BY MOUTH TWICE DAILY 11/29/20  Yes Deboraha Sprang, MD  cephALEXin (KEFLEX) 500 MG capsule Take 1 capsule (500 mg total) by mouth 2 (two) times daily. 12/18/20  Yes Evonne Rinks G, DO  Cholecalciferol (VITAMIN D3) 1000 UNITS CAPS Take 1,000 Units by mouth daily.    Yes [provider]  ELIQUIS 5 MG TABS tablet TAKE ONE TABLET BY MOUTH EVERY 12 HOURS 12/13/20  Yes End, Harrell Gave, MD   finasteride (PROSCAR) 5 MG tablet TAKE ONE TABLET BY MOUTH EVERY DAY 05/02/20  Yes McGowan, Larene Beach A, PA-C  levothyroxine (SYNTHROID) 100 MCG tablet Take 1 tablet (100 mcg total) by mouth daily before breakfast. 08/19/19  Yes Deboraha Sprang, MD  mexiletine (MEXITIL) 150 MG capsule TAKE 1 CAPSULE BY MOUTH TWICE DAILY 12/13/20  Yes Deboraha Sprang, MD  Multiple Vitamin (MULTIVITAMIN WITH MINERALS) TABS tablet Take 1 tablet by mouth daily. One A Day 50+   Yes [provider]  spironolactone (ALDACTONE) 25 MG tablet Take 1 tablet (25 mg total) by mouth daily. 05/12/20  Yes Deboraha Sprang, MD  tamsulosin (FLOMAX) 0.4 MG CAPS capsule TAKE 2 CAPSULES EVERY DAY 02/02/20  Yes McGowan, Larene Beach A, PA-C  torsemide (DEMADEX) 10 MG tablet TAKE ONE TAB (10MG ) BY MOUTH ONCE DAILY,THOUGH IF HE GAINS 2 POUNDS IN A DAY OR 5 POUNDS IN A WEEK, HE SHOULD RETURN TO 20MG  DAILY UNTIL BACK TO BASELINE WEIGHT 12/07/19  Yes End, Harrell Gave, MD  docusate sodium (COLACE) 50 MG capsule Take 50 mg by mouth 2 (two) times daily.    [provider]    [facility]  History Family History  Problem Relation Age of Onset  . Stroke Father   . Hypertension Father   . Heart attack Neg Hx   . Kidney disease Neg Hx   . Prostate cancer Neg Hx   . Kidney cancer Neg Hx   . Bladder Cancer Neg Hx     Social History Social History   Tobacco Use  . Smoking status: Former Smoker    Packs/day: 2.00    Years: 10.00    Pack years: 20.00    Types: Cigarettes    Start date: 02/10/1960    Quit date: 02/09/1970    Years since quitting: 50.8  . Smokeless tobacco: Never Used  . Tobacco comment: 05/2014    quit smoking  45 years ago  Vaping Use  . Vaping Use: Never used  Substance Use Topics  . Alcohol use: Yes    Comment: 4-5 beers a day several days per week  . Drug use: No     Allergies   Ciprofloxacin, Levofloxacin, Enalapril maleate, Metoprolol, and Sulfa antibiotics   Review of Systems Review of Systems Per  HPI  Physical Exam Triage Vital Signs ED Triage Vitals  Enc Vitals Group     BP 12/18/20 0826 118/64     Pulse Rate 12/18/20 0826 62     Resp 12/18/20 0826 16     Temp 12/18/20 0826 98.8 F (37.1 C)     Temp Source 12/18/20 0826 Oral     SpO2 12/18/20 0826 99 %     Weight 12/18/20 0822 172 lb (78 kg)     Height 12/18/20 0822 5\' 6"  (1.676 m)     Head Circumference --      Peak Flow --      Pain Score 12/18/20 0822 5     Pain Loc --      Pain Edu? --      Excl. in [place]? --    Updated Vital Signs BP 118/64 (BP Location: Left Arm)   Pulse 62   Temp 98.8 F (37.1 C) (Oral)   Resp 16   Ht 5\' 6"  (1.676 m)   Wt 78 kg   SpO2 99%   BMI 27.76 kg/m   Visual Acuity Right Eye Distance:   Left Eye Distance:   Bilateral Distance:    Right Eye Near:   Left Eye Near:    Bilateral Near:     Physical Exam Vitals and nursing note reviewed.  Constitutional:      General: He is not in acute distress.    Appearance: Normal appearance. He is not ill-appearing.  HENT:     Head: Normocephalic and atraumatic.  Cardiovascular:     Rate and Rhythm: Normal rate and regular rhythm.     Heart sounds: No murmur heard.   Pulmonary:     Effort: Pulmonary effort is normal.     Breath sounds: Normal breath sounds. No wheezing, rhonchi or rales.  Abdominal:     General: There is no distension.     Palpations: Abdomen is soft.     Tenderness: There is no abdominal tenderness.  Neurological:     Mental Status: He is alert.  Psychiatric:        Mood and Affect: Mood normal.        Behavior: Behavior normal.    UC Treatments / Results  Labs (all labs ordered are listed, but only abnormal results are displayed) Labs Reviewed  POCT URINALYSIS DIP (DEVICE) - Abnormal; Notable for  the following components:      Result Value   Glucose, UA 100 (*)    Bilirubin Urine LARGE (*)    Ketones, ur 40 (*)    Hgb urine dipstick LARGE (*)    pH 8.5 (*)    Protein, ur >=300 (*)    Leukocytes,Ua  LARGE (*)    All other components within normal limits  URINE CULTURE  POCT URINALYSIS DIPSTICK, ED / UC    EKG   Radiology No results found.  Procedures Procedures (including critical care time)  Medications Ordered in UC Medications - No data to display  Initial Impression / Assessment and Plan / UC Course  I have reviewed the triage vital signs and the nursing notes.  Pertinent labs & imaging results that were available during my care of the patient were reviewed by me and considered in my medical decision making (see chart for details).    80 year old male presents with gross hematuria.  Concern for hemorrhagic cystitis given the fact that there were large leukocytes noted as well.  Placing on Keflex.  I have spoken with his urology provider regarding his case.  She advised him to come in tomorrow at 230 for visit.  I am placing on Keflex while awaiting culture.    Final Clinical Impressions(s) / UC Diagnoses   Final diagnoses:  Gross hematuria     Discharge Instructions     Antibiotic as prescribed.  Appt tomorrow at 230 in the [facility] office upstairs.  Take care  Dr. Lacinda Axon    ED Prescriptions    Medication Sig Dispense Auth. Provider   cephALEXin (KEFLEX) 500 MG capsule Take 1 capsule (500 mg total) by mouth 2 (two) times daily. 14 capsule Thersa Salt G, DO     PDMP not reviewed this encounter.   Coral Spikes, DO 12/18/20 0900

## 2020-12-18 NOTE — ED Triage Notes (Signed)
Patient c/o blood in his urine that started yesterday.  Patient reports history of enlarged prostate and currently sees a Urologist.  Patient reports some pressure in his abdomen.  Patient denies fevers. Patient denies urinary frequency or urgency.

## 2020-12-18 NOTE — Progress Notes (Signed)
02/09/2020 3:09 PM  Austin Warren 02-01-1941 [id number]  Referring provider: Kirk Ruths, MD [place] [facility] [place],  [place] [address]  Chief Complaint  Patient presents with   Follow-up   Hematuria   Urological history: 1. BPH with LU TS -cysto 2016 enlarged prostate, estimated size greater than 100 g, significant trabeculation and left small Hutch diverticuli without masses -PVR 50 mL  -managed with tamsulosin 0.4 mg daily and finasteride 5 mg daily  2. High risk hematuria -former smoker -CTU 2016 Circumferential bladder wall thickening with an intraluminal filling defect seen along the anterior bladder wall on the delayed prone sequence. This has low attenuation and may represent clot within the bladder lumen. Urothelial lesion is considered less likely given the density, but not excluded.  Bilateral hypo enhancing renal lesions. These are likely cysts, but some have attenuation slightly higher than would be expected for simple cyst. Given the history of hematuria, MRI without with contrast may prove helpful to ensure that none of these lesions enhance -cysto 2016 enlarged prostate, estimated size greater than 100 g, significant trabeculation and left small Hutch diverticuli without masses -urine cytology 2016 negative -RUS 2017 Stable minimally complex left lower renal cyst, not appreciably changed in size since 2010 CT study. No additional renal lesions.  Stable diffuse chronic bladder wall thickening and trabeculation, consistent with chronic bladder outlet obstruction by the prominently enlarged prostate.   HPI: Austin Warren is a 80 y.o. male who was seen at [facility] urgent care yesterday for gross hematuria.    He states he woke up Saturday morning with the gross hematuria.  He passed some clots Saturday night and Saturday morning, but the clots have since abated.  He continues to have gross hematuria with every urinary  event.  He is able to urinate without difficulty at this time.  Patient denies any modifying or aggravating factors.  Patient denies any dysuria or suprapubic/flank pain.  Patient denies any fevers, chills, nausea or vomiting.   He was started on Keflex.  Urine culture is pending.  He continues to take finasteride 5 mg daily.  PMH: Past Medical History:  Diagnosis Date   Arthritis    Benign prostatic hypertrophy    Bursitis of left shoulder    CHF (congestive heart failure) (HCC)    Coronary artery disease 08/2019   Moderate multivessel CAD (not hemodynamically significant by CT-FFR)   Diabetes (HCC)    Dyspnea on exertion 11/01/2011   Elevated PSA    History of colon polyps    History of colon polyps    Hyperlipidemia    Hypertension    Melanoma ([place])    under arm   Microscopic hematuria    Osteoarthritis    Retroperitoneal mass    Stroke ([place]) 05/2014   TIA (transient ischemic attack)    Urinary retention    UTI (urinary tract infection)    Ventricular tachycardia (HCC)    RBB/LAHB ideopathic VT, 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    Surgical History: Past Surgical History:  Procedure Laterality Date   CARDIOVERSION     CARDIOVERSION N/A 03/07/2018   Procedure: CARDIOVERSION;  Surgeon: Wellington Hampshire, MD;  Location: [facility] ORS;  Service: Cardiovascular;  Laterality: N/A;   CARDIOVERSION N/A 04/06/2019   Procedure: CARDIOVERSION;  Surgeon: Wellington Hampshire, MD;  Location: [place] ORS;  Service: Cardiovascular;  Laterality: N/A;   CARDIOVERSION N/A 06/02/2019   Procedure: CARDIOVERSION;  Surgeon: Nelva Bush, MD;  Location: [facility] ORS;  Service: Cardiovascular;  Laterality: N/A;   COLONOSCOPY WITH PROPOFOL N/A 10/17/2015   Procedure: COLONOSCOPY WITH PROPOFOL;  Surgeon: Manya Silvas, MD;  Location: [facility] ENDOSCOPY;  Service: Endoscopy;  Laterality: N/A;   COLONOSCOPY WITH PROPOFOL N/A 03/17/2019   Procedure: COLONOSCOPY WITH PROPOFOL;  Surgeon: Toledo, Benay Pike, MD;   Location: [facility] ENDOSCOPY;  Service: Gastroenterology;  Laterality: N/A;   ELECTROPHYSIOLOGIC STUDY N/A 12/07/2015   Procedure: V Tach Ablation;  Surgeon: Will Ticer Leeds, MD;  Location: [place] CV LAB;  Service: Cardiovascular;  Laterality: N/A;   JOINT REPLACEMENT     R TKR   RADIOLOGY WITH ANESTHESIA N/A 05/18/2014   Procedure: RADIOLOGY WITH ANESTHESIA;  Surgeon: Rob Hickman, MD;  Location: [place];  Service: Radiology;  Laterality: N/A;   REPLACEMENT TOTAL KNEE     REPLACEMENT TOTAL KNEE Left    VASECTOMY      Home Medications:  Allergies as of 12/19/2020       Reactions   Ciprofloxacin Anaphylaxis   Levofloxacin Anaphylaxis   "throat closes"   Enalapril Maleate Swelling   Angioedema   Metoprolol Swelling   edema   Sulfa Antibiotics Rash, Other (See Comments)   Hypotension        Medication List        Accurate as of December 19, 2020  3:09 PM. If you have any questions, ask your nurse or doctor.          amiodarone 200 MG tablet Commonly known as: PACERONE TAKE 1 TABLET BY MOUTH DAILY   atorvastatin 10 MG tablet Commonly known as: LIPITOR Take 1 tablet by mouth daily.   carvedilol 3.125 MG tablet Commonly known as: COREG TAKE 1 TABLET BY MOUTH TWICE DAILY   cephALEXin 500 MG capsule Commonly known as: KEFLEX Take 1 capsule (500 mg total) by mouth 2 (two) times daily.   docusate sodium 50 MG capsule Commonly known as: COLACE Take 50 mg by mouth 2 (two) times daily.   Eliquis 5 MG Tabs tablet Generic drug: apixaban TAKE ONE TABLET BY MOUTH EVERY 12 HOURS   finasteride 5 MG tablet Commonly known as: PROSCAR TAKE ONE TABLET BY MOUTH EVERY DAY   levothyroxine 100 MCG tablet Commonly known as: SYNTHROID Take 1 tablet (100 mcg total) by mouth daily before breakfast.   mexiletine 150 MG capsule Commonly known as: MEXITIL TAKE 1 CAPSULE BY MOUTH TWICE DAILY   multivitamin with minerals Tabs tablet Take 1 tablet by mouth daily. One A Day  50+   spironolactone 25 MG tablet Commonly known as: ALDACTONE Take 1 tablet (25 mg total) by mouth daily.   tamsulosin 0.4 MG Caps capsule Commonly known as: FLOMAX TAKE 2 CAPSULES EVERY DAY   torsemide 10 MG tablet Commonly known as: DEMADEX TAKE ONE TAB (10MG) BY MOUTH ONCE DAILY,THOUGH IF HE GAINS 2 POUNDS IN A DAY OR 5 POUNDS IN A WEEK, HE SHOULD RETURN TO 20MG DAILY UNTIL BACK TO BASELINE WEIGHT   vitamin C 500 MG tablet Commonly known as: ASCORBIC ACID Take 500 mg by mouth daily.   Vitamin D3 25 MCG (1000 UT) Caps Take 1,000 Units by mouth daily.        Allergies:  Allergies  Allergen Reactions   Ciprofloxacin Anaphylaxis   Levofloxacin Anaphylaxis    "throat closes"   Enalapril Maleate Swelling    Angioedema   Metoprolol Swelling    edema   Sulfa Antibiotics Rash and Other (See Comments)  Hypotension    Family History: Family History  Problem Relation Age of Onset   Stroke Father    Hypertension Father    Heart attack Neg Hx    Kidney disease Neg Hx    Prostate cancer Neg Hx    Kidney cancer Neg Hx    Bladder Cancer Neg Hx     Social History:  reports that he quit smoking about 50 years ago. His smoking use included cigarettes. He started smoking about 60 years ago. He has a 20.00 pack-year smoking history. He has never used smokeless tobacco. He reports current alcohol use. He reports that he does not use drugs.  ROS: For pertinent review of systems please refer to history of present illness  Physical Exam: BP 123/77   Pulse 69   Ht 5' 6"  (1.676 m)   Wt 177 lb (80.3 kg)   BMI 28.57 kg/m   Constitutional:  Well nourished. Alert and oriented, No acute distress. HEENT: [place] AT, mask in place  Trachea midline Cardiovascular: No clubbing, cyanosis, or edema. Respiratory: Normal respiratory effort, no increased work of breathing. Neurologic: Grossly intact, no focal deficits, moving all 4 extremities. Psychiatric: Normal mood and  affect.   Laboratory Data: Thyroid Stimulating Hormone (TSH) 0.450-5.330 uIU/ml uIU/mL 2.562   Resulting Agency  [place] - LAB  Specimen Collected: 12/07/20 08:47 Last Resulted: 12/07/20 10:43  Received From: [facility]  Result Received: 12/08/20 14:27   Hemoglobin A1C 4.2 - 5.6 % 5.2   Average Blood Glucose (Calc) mg/dL 103   Resulting Agency  [facility] [place] - LAB   Narrative Performed by [facility] - LAB Normal Range:    4.2 - 5.6%  Increased Risk:  5.7 - 6.4%  Diabetes:        >= 6.5%  Glycemic Control for adults with diabetes:  <7%   Specimen Collected: 12/07/20 08:47 Last Resulted: 12/07/20 10:58  Received From: [facility]  Result Received: 12/08/20 14:27   Protein, Total 6.1 - 7.9 g/dL 7.6   Albumin 3.5 - 4.8 g/dL 4.2   Bilirubin, Total 0.3 - 1.2 mg/dL 1.1   Bilirubin, Conjugated 0.00 - 0.20 mg/dL 0.19   Alk Phos (alkaline Phosphatase) 34 - 104 U/L 126 High    AST  8 - 39 U/L 40 High    ALT  6 - 57 U/L 33   Resulting Agency  [facility] - LAB  Specimen Collected: 12/07/20 08:47 Last Resulted: 12/07/20 10:51  Received From: [facility]  Result Received: 12/08/20 14:27   Cholesterol, Total 100 - 200 mg/dL 145   Triglyceride 35 - 199 mg/dL 111   HDL (High Density Lipoprotein) Cholesterol 29.0 - 71.0 mg/dL 65.7   LDL Calculated 0 - 130 mg/dL 57   VLDL Cholesterol mg/dL 22   Cholesterol/HDL Ratio  2.2   Resulting Agency  [place] - LAB  Specimen Collected: 12/07/20 08:47 Last Resulted: 12/07/20 10:51  Received From: [facility]  Result Received: 12/08/20 14:27   Glucose 70 - 110 mg/dL 109   Sodium 136 - 145 mmol/L 137   Potassium 3.6 - 5.1 mmol/L 4.6   Chloride 97 - 109 mmol/L 101   Carbon Dioxide (CO2) 22.0 - 32.0 mmol/L 28.6   Calcium 8.7 - 10.3 mg/dL 9.0   Urea Nitrogen (BUN) 7 - 25 mg/dL 15   Creatinine 0.7 - 1.3 mg/dL 0.8   Glomerular  Filtration Rate (eGFR), MDRD Estimate >  60 mL/min/1.73sq m 93   BUN/Crea Ratio 6.0 - 20.0 18.8   Anion Gap w/K 6.0 - 16.0 12.0   Resulting Agency  [facility] - LAB  Specimen Collected: 12/07/20 08:47 Last Resulted: 12/07/20 10:51  Received From: [facility]  Result Received: 12/08/20 14:27    Lab Results  Component Value Date   WBC 8.8 05/12/2020   HGB 14.7 05/12/2020   HCT 42.8 05/12/2020   MCV 98 (H) 05/12/2020   PLT 184 05/12/2020    PSA History:  5.5 ng/mL on 10/00/2005-bx was benign  1.3 ng/mL on 00/00/2013  1.4 ng/mL on 11/26/2012  1.6 ng/mL on 11/26/2013  0.9 ng/mL on 12/10/2014  Results for orders placed or performed in visit on 12/19/20  BLADDER SCAN AMB NON-IMAGING  Result Value Ref Range   Scan Result 36m   I have reviewed the labs.  Assessment & Plan:    1. Gross hematuria -Likely secondary to massive BPH-continue finasteride -Have reached out with Dr. ESaunders Reveland he can discontinue his Eliquis for now  -Urine culture is still pending we will continue to follow -He will return for recheck on Thursday afternoon with me -If urine culture is negative, we will need to pursue hematuria work-up -I have scheduled a cystoscopy for the next 2 weeks at this time, we will cancel if urine culture is positive and hematuria clears -We cannot proceed with a CT urogram at this time due to the contrast shortage, but we may need to pursue in the future pending urine culture and cystoscopy results   Return for Thursday .  These notes generated with voice recognition software. I apologize for typographical errors.  BClemmons1Flower MoundRRankinBRivanna Klukwan [phone] SZara Council PA-C

## 2020-12-18 NOTE — Discharge Instructions (Addendum)
Antibiotic as prescribed.  Appt tomorrow at 230 in the [facility] office upstairs.  Take care  Dr. Lacinda Axon

## 2020-12-19 ENCOUNTER — Ambulatory Visit: Payer: Medicare Other | Admitting: Urology

## 2020-12-19 ENCOUNTER — Encounter: Payer: Self-pay | Admitting: Urology

## 2020-12-19 VITALS — BP 123/77 | HR 69 | Ht 66.0 in | Wt 177.0 lb

## 2020-12-19 DIAGNOSIS — N138 Other obstructive and reflux uropathy: Secondary | ICD-10-CM

## 2020-12-19 DIAGNOSIS — R310 Gross hematuria: Secondary | ICD-10-CM | POA: Diagnosis not present

## 2020-12-19 LAB — BLADDER SCAN AMB NON-IMAGING

## 2020-12-20 ENCOUNTER — Telehealth: Payer: Self-pay | Admitting: Urology

## 2020-12-20 LAB — URINE CULTURE: Culture: <10000 — AB

## 2020-12-20 NOTE — Telephone Encounter (Signed)
Austin Warren's urine culture was negative for infection.  We do inquire to see if he is still experiencing gross hematuria?

## 2020-12-22 ENCOUNTER — Ambulatory Visit: Payer: Medicare Other | Admitting: Urology

## 2020-12-22 ENCOUNTER — Encounter: Payer: Self-pay | Admitting: Urology

## 2020-12-22 ENCOUNTER — Other Ambulatory Visit: Payer: Self-pay

## 2020-12-22 VITALS — BP 112/69 | HR 64 | Ht 66.0 in | Wt 177.0 lb

## 2020-12-22 DIAGNOSIS — R310 Gross hematuria: Secondary | ICD-10-CM

## 2020-12-22 NOTE — Progress Notes (Signed)
02/09/2020 11:26 AM  Austin Warren 09/07/1940 [id number]  Referring provider: Kirk Ruths, MD [facility] [place],  [place] [address]  Chief Complaint  Patient presents with   Hematuria   Urological history: 1. BPH with LU TS -cysto 2016 enlarged prostate, estimated size greater than 100 g, significant trabeculation and left small Hutch diverticuli without masses -PVR 50 mL  -managed with tamsulosin 0.4 mg daily and finasteride 5 mg daily  2. High risk hematuria -former smoker -CTU 2016 Circumferential bladder wall thickening with an intraluminal filling defect seen along the anterior bladder wall on the delayed prone sequence. This has low attenuation and may represent clot within the bladder lumen. Urothelial lesion is considered less likely given the density, but not excluded.  Bilateral hypo enhancing renal lesions. These are likely cysts, but some have attenuation slightly higher than would be expected for simple cyst. Given the history of hematuria, MRI without with contrast may prove helpful to ensure that none of these lesions enhance -cysto 2016 enlarged prostate, estimated size greater than 100 g, significant trabeculation and left small Hutch diverticuli without masses -urine cytology 2016 negative -RUS 2017 Stable minimally complex left lower renal cyst, not appreciably changed in size since 2010 CT study. No additional renal lesions.  Stable diffuse chronic bladder wall thickening and trabeculation, consistent with chronic bladder outlet obstruction by the prominently enlarged prostate.   HPI: Austin Warren is a 80 y.o. male who presents today for a recheck on his hematuria with his wife, Austin Warren.   He states the urine cleared by Tuesday afternoon after the cessation of his Eliquis.  His urine culture was also found to be negative, so he has discontinued his antibiotic as well.  Patient denies any modifying or aggravating  factors.  Patient denies any gross hematuria, dysuria or suprapubic/flank pain.  Patient denies any fevers, chills, nausea or vomiting.    His urine today is grossly yellow clear.   PMH: Past Medical History:  Diagnosis Date   Arthritis    Benign prostatic hypertrophy    Bursitis of left shoulder    CHF (congestive heart failure) (HCC)    Coronary artery disease 08/2019   Moderate multivessel CAD (not hemodynamically significant by CT-FFR)   Diabetes (HCC)    Dyspnea on exertion 11/01/2011   Elevated PSA    History of colon polyps    History of colon polyps    Hyperlipidemia    Hypertension    Melanoma ([place])    under arm   Microscopic hematuria    Osteoarthritis    Retroperitoneal mass    Stroke ([place]) 05/2014   TIA (transient ischemic attack)    Urinary retention    UTI (urinary tract infection)    Ventricular tachycardia (HCC)    RBB/LAHB ideopathic VT, 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    Surgical History: Past Surgical History:  Procedure Laterality Date   CARDIOVERSION     CARDIOVERSION N/A 03/07/2018   Procedure: CARDIOVERSION;  Surgeon: Wellington Hampshire, MD;  Location: [facility] ORS;  Service: Cardiovascular;  Laterality: N/A;   CARDIOVERSION N/A 04/06/2019   Procedure: CARDIOVERSION;  Surgeon: Wellington Hampshire, MD;  Location: [place] [facility]S;  Service: Cardiovascular;  Laterality: N/A;   CARDIOVERSION N/A 06/02/2019   Procedure: CARDIOVERSION;  Surgeon: Nelva Bush, MD;  Location: [facility] ORS;  Service: Cardiovascular;  Laterality: N/A;   COLONOSCOPY WITH PROPOFOL N/A 10/17/2015   Procedure: COLONOSCOPY WITH PROPOFOL;  Surgeon: Manya Silvas,  MD;  Location: [facility] ENDOSCOPY;  Service: Endoscopy;  Laterality: N/A;   COLONOSCOPY WITH PROPOFOL N/A 03/17/2019   Procedure: COLONOSCOPY WITH PROPOFOL;  Surgeon: Toledo, Benay Pike, MD;  Location: [facility] ENDOSCOPY;  Service: Gastroenterology;  Laterality: N/A;   ELECTROPHYSIOLOGIC STUDY N/A 12/07/2015   Procedure: V Tach Ablation;  Surgeon:  Will Gulledge Leeds, MD;  Location: [facility] CV LAB;  Service: Cardiovascular;  Laterality: N/A;   JOINT REPLACEMENT     R TKR   RADIOLOGY WITH ANESTHESIA N/A 05/18/2014   Procedure: RADIOLOGY WITH ANESTHESIA;  Surgeon: Rob Hickman, MD;  Location: [facility];  Service: Radiology;  Laterality: N/A;   REPLACEMENT TOTAL KNEE     REPLACEMENT TOTAL KNEE Left    VASECTOMY      Home Medications:  Allergies as of 12/22/2020       Reactions   Ciprofloxacin Anaphylaxis   Levofloxacin Anaphylaxis   "throat closes"   Enalapril Maleate Swelling   Angioedema   Metoprolol Swelling   edema   Sulfa Antibiotics Rash, Other (See Comments)   Hypotension        Medication List        Accurate as of December 22, 2020 11:26 AM. If you have any questions, ask your nurse or doctor.          amiodarone 200 MG tablet Commonly known as: PACERONE TAKE 1 TABLET BY MOUTH DAILY   atorvastatin 10 MG tablet Commonly known as: LIPITOR Take 1 tablet by mouth daily.   carvedilol 3.125 MG tablet Commonly known as: COREG TAKE 1 TABLET BY MOUTH TWICE DAILY   cephALEXin 500 MG capsule Commonly known as: KEFLEX Take 1 capsule (500 mg total) by mouth 2 (two) times daily.   docusate sodium 50 MG capsule Commonly known as: COLACE Take 50 mg by mouth 2 (two) times daily.   Eliquis 5 MG Tabs tablet Generic drug: apixaban TAKE ONE TABLET BY MOUTH EVERY 12 HOURS   finasteride 5 MG tablet Commonly known as: PROSCAR TAKE ONE TABLET BY MOUTH EVERY DAY   levothyroxine 100 MCG tablet Commonly known as: SYNTHROID Take 1 tablet (100 mcg total) by mouth daily before breakfast.   mexiletine 150 MG capsule Commonly known as: MEXITIL TAKE 1 CAPSULE BY MOUTH TWICE DAILY   multivitamin with minerals Tabs tablet Take 1 tablet by mouth daily. One A Day 50+   spironolactone 25 MG tablet Commonly known as: ALDACTONE Take 1 tablet (25 mg total) by mouth daily.   tamsulosin 0.4 MG Caps capsule Commonly  known as: FLOMAX TAKE 2 CAPSULES EVERY DAY   torsemide 10 MG tablet Commonly known as: DEMADEX TAKE ONE TAB (10MG ) BY MOUTH ONCE DAILY,THOUGH IF HE GAINS 2 POUNDS IN A DAY OR 5 POUNDS IN A WEEK, HE SHOULD RETURN TO 20MG  DAILY UNTIL BACK TO BASELINE WEIGHT   vitamin C 500 MG tablet Commonly known as: ASCORBIC ACID Take 500 mg by mouth daily.   Vitamin D3 25 MCG (1000 UT) Caps Take 1,000 Units by mouth daily.        Allergies:  Allergies  Allergen Reactions   Ciprofloxacin Anaphylaxis   Levofloxacin Anaphylaxis    "throat closes"   Enalapril Maleate Swelling    Angioedema   Metoprolol Swelling    edema   Sulfa Antibiotics Rash and Other (See Comments)    Hypotension    Family History: Family History  Problem Relation Age of Onset   Stroke Father    Hypertension Father    Heart  attack Neg Hx    Kidney disease Neg Hx    Prostate cancer Neg Hx    Kidney cancer Neg Hx    Bladder Cancer Neg Hx     Social History:  reports that he quit smoking about 50 years ago. His smoking use included cigarettes. He started smoking about 60 years ago. He has a 20.00 pack-year smoking history. He has never used smokeless tobacco. He reports current alcohol use. He reports that he does not use drugs.  ROS: For pertinent review of systems please refer to history of present illness  Physical Exam: BP 112/69   Pulse 64   Ht 5\' 6"  (1.676 m)   Wt 177 lb (80.3 kg)   BMI 28.57 kg/m   Constitutional:  Well nourished. Alert and oriented, No acute distress. HEENT: [place] AT, mask in place  Trachea midline Cardiovascular: No clubbing, cyanosis, or edema. Respiratory: Normal respiratory effort, no increased work of breathing. Neurologic: Grossly intact, no focal deficits, moving all 4 extremities. Psychiatric: Normal mood and affect.   Laboratory Data: No new labs since last visit  I have reviewed the labs.  Assessment & Plan:    1. Gross hematuria -Resolved with the Eliquis  cessation -Cystoscopic is pending -He will resume the Eliquis, but he will discontinue it again if the gross hematuria recurs -[place], so hopefully the gross hematuria will not recur or if it does recur stopping the Eliquis will keep his urine clear until he is able to come back for his cystoscopy   Return for cysto with Dr. Bernardo Heater on 06/23 for gross hemturia .  These notes generated with voice recognition software. I apologize for typographical errors.  [place], [place] [address] [phone]  Zara Council, PA-C

## 2020-12-22 NOTE — Telephone Encounter (Signed)
Patient was seen today, no hematuria noted

## 2021-01-02 ENCOUNTER — Telehealth: Payer: Self-pay | Admitting: Urology

## 2021-01-02 NOTE — Telephone Encounter (Signed)
Patient's wife called the office today.  Patient is having difficulty having a bowel movement.  He is wondering if it could possibly be a result of his enlarged prostate.  He is scheduled for a cystoscopy this week with Dr. Bernardo Heater, but really needs advise about his constipation and difficulty having a bowel movement.   Please contact patient to advise.

## 2021-01-02 NOTE — Telephone Encounter (Signed)
Spoke to patient's wife and she states he is having bowel movements however he feels he is not completely emptying. I advised him to see his PCP for constipation. We will see him on Thursday for Cysto. She voiced understanding.

## 2021-01-05 ENCOUNTER — Other Ambulatory Visit: Payer: Self-pay

## 2021-01-05 ENCOUNTER — Ambulatory Visit: Payer: Medicare Other | Admitting: Urology

## 2021-01-05 ENCOUNTER — Encounter: Payer: Self-pay | Admitting: Urology

## 2021-01-05 VITALS — BP 112/73 | HR 74 | Ht 66.0 in | Wt 175.0 lb

## 2021-01-05 DIAGNOSIS — R310 Gross hematuria: Secondary | ICD-10-CM

## 2021-01-05 LAB — URINALYSIS, COMPLETE
Bilirubin, UA: NEGATIVE
Glucose, UA: NEGATIVE
Ketones, UA: NEGATIVE
Nitrite, UA: NEGATIVE
Protein,UA: NEGATIVE
Specific Gravity, UA: 1.015 (ref 1.005–1.030)
Urobilinogen, Ur: 0.2 mg/dL (ref 0.2–1.0)
pH, UA: 6 (ref 5.0–7.5)

## 2021-01-05 LAB — MICROSCOPIC EXAMINATION: Bacteria, UA: NONE SEEN

## 2021-01-05 NOTE — Progress Notes (Signed)
   01/05/21  CC:  Chief Complaint  Patient presents with   Cysto    Indications: Gross hematuria; see Shannon's note 12/19/2020  HPI: Mr. Reigle has no complaints and denies recurrent hematuria  NED. A&Ox3.   No respiratory distress   Abd soft, NT, ND Normal phallus with bilateral descended testicles  Cystoscopy Procedure Note  Patient identification was confirmed, informed consent was obtained, and patient was prepped using Betadine solution.  Lidocaine jelly was administered per urethral meatus.     Pre-Procedure: - Inspection reveals a normal caliber urethral meatus.  Procedure: The flexible cystoscope was introduced without difficulty - No urethral strictures/lesions are present. -  Prominent lateral lobe enlargement with hypervascularity  prostate  - Elevated bladder neck - Bilateral ureteral orifices identified - Bladder mucosa  reveals no ulcers, tumors, or lesions - No bladder stones -Trabeculated bladder with scattered cellules/diverticula  Retroflexion shows moderate intravesical median lobe with backbleeding from prostate   Post-Procedure: - Patient tolerated the procedure well  Assessment/ Plan: No bladder mucosal abnormalities Marked prostate enlargement with hypervascularity; these findings in combination with Eliquis are the most likely source of his hematuria Urine cytology sent Discussed HoLEP for recurrent hematuria Follow-up with [facility] 6 months    Abbie Sons, MD

## 2021-01-06 LAB — CYTOLOGY - NON PAP

## 2021-01-09 ENCOUNTER — Telehealth: Payer: Self-pay | Admitting: *Deleted

## 2021-01-09 NOTE — Telephone Encounter (Signed)
-----   Message from Abbie Sons, MD sent at 01/07/2021 10:13 PM EDT ----- Urine cytology showed no suspicious cells

## 2021-01-09 NOTE — Telephone Encounter (Signed)
Notified patient as instructed, patient pleased. Discussed follow-up appointments, patient agrees  

## 2021-01-27 ENCOUNTER — Other Ambulatory Visit: Payer: Self-pay | Admitting: Urology

## 2021-02-01 ENCOUNTER — Other Ambulatory Visit: Payer: Self-pay | Admitting: Internal Medicine

## 2021-02-08 ENCOUNTER — Ambulatory Visit: Payer: Self-pay | Admitting: Urology

## 2021-02-20 ENCOUNTER — Other Ambulatory Visit: Payer: Self-pay | Admitting: Internal Medicine

## 2021-03-06 ENCOUNTER — Other Ambulatory Visit: Payer: Self-pay | Admitting: Internal Medicine

## 2021-03-08 ENCOUNTER — Other Ambulatory Visit: Payer: Self-pay

## 2021-03-08 MED ORDER — AMIODARONE HCL 200 MG PO TABS: ORAL_TABLET | ORAL | 3 refills | Status: DC

## 2021-03-08 MED ORDER — MEXILETINE HCL 150 MG PO CAPS: 150.0000 mg | ORAL_CAPSULE | Freq: Two times a day (BID) | ORAL | 0 refills | Status: DC

## 2021-03-08 NOTE — Telephone Encounter (Signed)
*  STAT* If patient is at the pharmacy, call can be transferred to refill team.   1. Which medications need to be refilled? (please list name of each medication and dose if known) Mexitil and Amiodarone  2. Which pharmacy/location (including street and city if local pharmacy) is medication to be sent to? [facility]  3. Do they need a 30 day or 90 day supply? [place]

## 2021-04-17 ENCOUNTER — Other Ambulatory Visit: Payer: Self-pay | Admitting: Urology

## 2021-04-17 DIAGNOSIS — N138 Other obstructive and reflux uropathy: Secondary | ICD-10-CM

## 2021-06-12 ENCOUNTER — Other Ambulatory Visit: Payer: Self-pay | Admitting: Internal Medicine

## 2021-06-26 NOTE — Progress Notes (Addendum)
  02/09/2020 10:40 AM  [place] 07/28/1940 [id number]  Referring provider: Anderson, Marshall W, MD [address] [facility] [name],  [place] [address]  Chief Complaint  Patient presents with   Benign Prostatic Hypertrophy    Urological history: 1. BPH with LU TS -cysto 2016 enlarged prostate, estimated size greater than 100 g, significant trabeculation and left small Hutch diverticuli without masses -cysto 12/2020 -  Prominent lateral lobe enlargement with hypervascularity  prostate  -I PSS 5/1 -managed with tamsulosin 0.4 mg two daily and finasteride 5 mg daily  2. High risk hematuria -former smoker -CTU 2016 Circumferential bladder wall thickening with an intraluminal filling defect seen along the anterior bladder wall on the delayed prone sequence. This has low attenuation and may represent clot within the bladder lumen. Urothelial lesion is considered less likely given the density, but not excluded.  Bilateral hypo enhancing renal lesions. These are likely cysts, but some have attenuation slightly higher than would be expected for simple cyst. Given the history of hematuria, MRI without with contrast may prove helpful to ensure that none of these lesions enhance -cysto 2016 enlarged prostate, estimated size greater than 100 g, significant trabeculation and left small Hutch diverticuli without masses -urine cytology 2016 negative -RUS 2017 Stable minimally complex left lower renal cyst, not appreciably changed in size since 2010 CT study. No additional renal lesions.  Stable diffuse chronic bladder wall thickening and trabeculation, consistent with chronic bladder outlet obstruction by the prominently enlarged prostate -CT urogram not performed due to [place] contrast shortage -cysto 2022 -  Prominent lateral lobe enlargement with hypervascularity  prostate  -urine cytology 2022 - NED -no reports of gross heme -UA no micro heme   HPI: Austin [place] is a 80 y.o. male who presents today for 6 month follow up with his wife, [place].   UA nitrite positive, > 30 WBC's and many bacteria.   He has no urinary complaints at this visit, so will not send urine for culture as he is likely colonized.  He has no urinary complaints.  Patient denies any modifying or aggravating factors.  Patient denies any gross hematuria, dysuria or suprapubic/flank pain.  Patient denies any fevers, chills, nausea or vomiting.      IPSS     Row Name 06/27/21 1000         International Prostate Symptom Score   How often have you had the sensation of not emptying your bladder? Less than 1 in 5     How often have you had to urinate less than every two hours? Less than 1 in 5 times     How often have you found you stopped and started again several times when you urinated? Less than 1 in 5 times     How often have you found it difficult to postpone urination? Less than 1 in 5 times     How often have you had a weak urinary stream? Not at All     How often have you had to strain to start urination? Not at All     How many times did you typically get up at night to urinate? 1 Time     Total IPSS Score 5       Quality of Life due to urinary symptoms   If you were to spend the rest of your life with your urinary condition just the way it is now how would you feel about that? Pleased                Score:  1-7 Mild 8-19 Moderate 20-35 Severe   PMH: Past Medical History:  Diagnosis Date   Arthritis    Benign prostatic hypertrophy    Bursitis of left shoulder    CHF (congestive heart failure) (HCC)    Coronary artery disease 08/2019   Moderate multivessel CAD (not hemodynamically significant by CT-FFR)   Diabetes (HCC)    Dyspnea on exertion 11/01/2011   Elevated PSA    History of colon polyps    History of colon polyps    Hyperlipidemia    Hypertension    Melanoma (HCC)    under arm   Microscopic hematuria    Osteoarthritis     Retroperitoneal mass    Stroke (HCC) 05/2014   TIA (transient ischemic attack)    Urinary retention    UTI (urinary tract infection)    Ventricular tachycardia    RBB/LAHB ideopathic VT, 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    Surgical History: Past Surgical History:  Procedure Laterality Date   CARDIOVERSION     CARDIOVERSION N/A 03/07/2018   Procedure: CARDIOVERSION;  Surgeon: Arida, Muhammad A, MD;  Location: [facility];  Service: Cardiovascular;  Laterality: N/A;   CARDIOVERSION N/A 04/06/2019   Procedure: CARDIOVERSION;  Surgeon: Arida, Muhammad A, MD;  Location: [facility];  Service: Cardiovascular;  Laterality: N/A;   CARDIOVERSION N/A 06/02/2019   Procedure: CARDIOVERSION;  Surgeon: End, Christopher, MD;  Location: [facility];  Service: Cardiovascular;  Laterality: N/A;   COLONOSCOPY WITH PROPOFOL N/A 10/17/2015   Procedure: COLONOSCOPY WITH PROPOFOL;  Surgeon: Robert T Elliott, MD;  Location: [facility];  Service: Endoscopy;  Laterality: N/A;   COLONOSCOPY WITH PROPOFOL N/A 03/17/2019   Procedure: COLONOSCOPY WITH PROPOFOL;  Surgeon: Toledo, Teodoro K, MD;  Location: [facility];  Service: Gastroenterology;  Laterality: N/A;   ELECTROPHYSIOLOGIC STUDY N/A 12/07/2015   Procedure: V Tach Ablation;  Surgeon: Will Martin Camnitz, MD;  Location: [facility];  Service: Cardiovascular;  Laterality: N/A;   JOINT REPLACEMENT     R TKR   RADIOLOGY WITH ANESTHESIA N/A 05/18/2014   Procedure: RADIOLOGY WITH ANESTHESIA;  Surgeon: Sanjeev K Deveshwar, MD;  Location: [facility];  Service: Radiology;  Laterality: N/A;   REPLACEMENT TOTAL KNEE     REPLACEMENT TOTAL KNEE Left    VASECTOMY      Home Medications:  Allergies as of 06/27/2021       Reactions   Ciprofloxacin Anaphylaxis   Levofloxacin Anaphylaxis   "throat closes"   Enalapril Maleate Swelling   Angioedema   Metoprolol Swelling   edema   Sulfa Antibiotics Rash, Other (See Comments)   Hypotension        Medication List         Accurate as of June 27, 2021 10:40 AM. If you have any questions, ask your nurse or doctor.          STOP taking these medications    cephALEXin 500 MG capsule Commonly known as: KEFLEX Stopped by:  , PA-C       TAKE these medications    amiodarone 200 MG tablet Commonly known as: PACERONE TAKE 1 TABLET BY MOUTH DAILY   atorvastatin 10 MG tablet Commonly known as: LIPITOR Take 1 tablet by mouth daily.   carvedilol 3.125 MG tablet Commonly known as: COREG TAKE 1 TABLET BY MOUTH TWICE DAILY   docusate sodium 50 MG capsule Commonly known as: COLACE Take 50 mg by mouth 2 (two) times daily.   Eliquis 5 MG Tabs tablet   Generic drug: apixaban TAKE ONE TABLET BY MOUTH EVERY 12 HOURS   finasteride 5 MG tablet Commonly known as: PROSCAR TAKE 1 TABLET BY MOUTH DAILY   levothyroxine 100 MCG tablet Commonly known as: SYNTHROID Take 1 tablet (100 mcg total) by mouth daily before breakfast.   mexiletine 150 MG capsule Commonly known as: MEXITIL TAKE 1 CAPSULE BY MOUTH TWICE DAILY   multivitamin with minerals Tabs tablet Take 1 tablet by mouth daily. One A Day 50+   spironolactone 25 MG tablet Commonly known as: ALDACTONE TAKE ONE TABLET BY MOUTH EVERY DAY   tamsulosin 0.4 MG Caps capsule Commonly known as: FLOMAX TAKE 2 CAPSULES BY MOUTH DAILY   torsemide 10 MG tablet Commonly known as: DEMADEX TAKE ONE TAB (10MG) BY MOUTH ONCE DAILY,THOUGH IF HE GAINS 2 POUNDS IN A DAY OR 5 POUNDS IN A WEEK, HE SHOULD RETURN TO 20MG DAILY UNTIL BACK TO BASELINE WEIGHT   vitamin C 500 MG tablet Commonly known as: ASCORBIC ACID Take 500 mg by mouth daily.   Vitamin D3 25 MCG (1000 UT) Caps Take 1,000 Units by mouth daily.        Allergies:  Allergies  Allergen Reactions   Ciprofloxacin Anaphylaxis   Levofloxacin Anaphylaxis    "throat closes"   Enalapril Maleate Swelling    Angioedema   Metoprolol Swelling    edema   Sulfa Antibiotics Rash and  Other (See Comments)    Hypotension    Family History: Family History  Problem Relation Age of Onset   Stroke Father    Hypertension Father    Heart attack Neg Hx    Kidney disease Neg Hx    Prostate cancer Neg Hx    Kidney cancer Neg Hx    Bladder Cancer Neg Hx     Social History:  reports that he quit smoking about 51 years ago. His smoking use included cigarettes. He started smoking about 61 years ago. He has a 20.00 pack-year smoking history. He has never used smokeless tobacco. He reports current alcohol use. He reports that he does not use drugs.  ROS: For pertinent review of systems please refer to history of present illness  Physical Exam: BP 123/78   Pulse 93   Ht 5' 6" (1.676 m)   Wt 180 lb (81.6 kg)   BMI 29.05 kg/m   Constitutional:  Well nourished. Alert and oriented, No acute distress. HEENT: [place] AT, mask in place.  Trachea midline Cardiovascular: No clubbing, cyanosis, or edema. Respiratory: Normal respiratory effort, no increased work of breathing. Neurologic: Grossly intact, no focal deficits, moving all 4 extremities. Psychiatric: Normal mood and affect.   Laboratory Data: Component     Latest Ref Rng & Units 06/27/2021  Specific Gravity, UA     1.005 - 1.030 1.010  pH, UA     5.0 - 7.5 6.0  Color, UA     Yellow Yellow  Appearance Ur     Clear Cloudy (A)  Leukocytes,UA     Negative 3+ (A)  Protein,UA     Negative/Trace Negative  Glucose, UA     Negative Negative  Ketones, UA     Negative Negative  RBC, UA     Negative 1+ (A)  Bilirubin, UA     Negative Negative  Urobilinogen, Ur     0.2 - 1.0 mg/dL 0.2  Nitrite, UA     Negative Positive (A)  Microscopic Examination      See below:   Component  Latest Ref Rng & Units 06/27/2021  WBC, UA     0 - 5 /hpf >30 (H)  RBC     0 - 2 /hpf None seen  Epithelial Cells (non renal)     0 - 10 /hpf 0-10  Bacteria, UA     None seen/Few Many (A)   Glucose 70 - 110 mg/dL 117 High    Sodium  136 - 145 mmol/L 138   Potassium 3.6 - 5.1 mmol/L 4.3   Chloride 97 - 109 mmol/L 101   Carbon Dioxide (CO2) 22.0 - 32.0 mmol/L 29.5   Urea Nitrogen (BUN) 7 - 25 mg/dL 21   Creatinine 0.7 - 1.3 mg/dL 1.0   Glomerular Filtration Rate (eGFR), MDRD Estimate >60 mL/min/1.73sq m 72   Calcium 8.7 - 10.3 mg/dL 9.1   AST  8 - 39 U/L 26   ALT  6 - 57 U/L 28   Alk Phos (alkaline Phosphatase) 34 - 104 U/L 140 High    Albumin 3.5 - 4.8 g/dL 4.2   Bilirubin, Total 0.3 - 1.2 mg/dL 1.5 High    Protein, Total 6.1 - 7.9 g/dL 7.0   A/G Ratio 1.0 - 5.0 gm/dL 1.5   Resulting Agency  [facility] - LAB  Specimen Collected: 06/14/21 08:42 Last Resulted: 06/14/21 11:15  Received From: [facility]  Result Received: 06/23/21 15:42   Hemoglobin A1C 4.2 - 5.6 % 5.2   Average Blood Glucose (Calc) mg/dL 103   Resulting Agency  [facility] - LAB  Narrative Performed by [facility] - [facility] Normal Range:    4.2 - 5.6%  Increased Risk:  5.7 - 6.4%  Diabetes:        >= 6.5%  Glycemic Control for adults with diabetes:  <7%   Specimen Collected: 06/14/21 08:42 Last Resulted: 06/14/21 09:52  Received From: [facility]  Result Received: 06/23/21 15:42   Thyroid Stimulating Hormone (TSH) 0.450-5.330 uIU/ml uIU/mL 5.219   Resulting Agency  [facility] - LAB  Specimen Collected: 06/14/21 08:42 Last Resulted: 06/14/21 10:29  Received From: [facility]  Result Received: 06/23/21 15:42   Cholesterol, Total 100 - 200 mg/dL 144   Triglyceride 35 - 199 mg/dL 145   HDL (High Density Lipoprotein) Cholesterol 29.0 - 71.0 mg/dL 73.5 High    LDL Calculated 0 - 130 mg/dL 42   VLDL Cholesterol mg/dL 29   Cholesterol/HDL Ratio  2.0   Resulting Agency  [facility] - LAB  Specimen Collected: 06/14/21 08:42 Last Resulted: 06/14/21 11:16  Received From: [facility]  Result Received: 06/23/21 15:42  I have reviewed the  labs.  Assessment & Plan:    1. BPH with LUTS -UA benign -continue conservative management, avoiding bladder irritants and timed voiding's -Continue tamsulosin 0.4 mg two daily and finasteride 5 mg daily  2. High risk hematuria -cysto NED -CT urogram not performed due to contrast shortage -no reports of gross heme -UA negative for micro heme    Return in about 1 year (around 06/27/2022) for UA and IPSS.  These notes generated with voice recognition software. I apologize for typographical errors.  [address] [place], [place] [address] [phone]  Zara Council, PA-C

## 2021-06-27 ENCOUNTER — Ambulatory Visit: Payer: Medicare Other | Admitting: Urology

## 2021-06-27 ENCOUNTER — Encounter: Payer: Self-pay | Admitting: Urology

## 2021-06-27 ENCOUNTER — Other Ambulatory Visit: Payer: Self-pay

## 2021-06-27 VITALS — BP 123/78 | HR 93 | Ht 66.0 in | Wt 180.0 lb

## 2021-06-27 DIAGNOSIS — N138 Other obstructive and reflux uropathy: Secondary | ICD-10-CM

## 2021-06-27 DIAGNOSIS — N401 Enlarged prostate with lower urinary tract symptoms: Secondary | ICD-10-CM | POA: Diagnosis not present

## 2021-06-27 DIAGNOSIS — R319 Hematuria, unspecified: Secondary | ICD-10-CM

## 2021-06-27 LAB — URINALYSIS, COMPLETE
Bilirubin, UA: NEGATIVE
Glucose, UA: NEGATIVE
Ketones, UA: NEGATIVE
Nitrite, UA: POSITIVE — AB
Protein,UA: NEGATIVE
Specific Gravity, UA: 1.01 (ref 1.005–1.030)
Urobilinogen, Ur: 0.2 mg/dL (ref 0.2–1.0)
pH, UA: 6 (ref 5.0–7.5)

## 2021-06-27 LAB — MICROSCOPIC EXAMINATION
RBC, Urine: NONE SEEN /hpf (ref 0–2)
WBC, UA: >30 /hpf — ABNORMAL HIGH (ref 0–5)

## 2021-06-28 ENCOUNTER — Ambulatory Visit: Payer: Self-pay | Admitting: Urology

## 2021-06-29 ENCOUNTER — Encounter: Payer: Self-pay | Admitting: Internal Medicine

## 2021-06-29 ENCOUNTER — Other Ambulatory Visit: Payer: Self-pay

## 2021-06-29 ENCOUNTER — Ambulatory Visit: Payer: Medicare Other | Admitting: Internal Medicine

## 2021-06-29 VITALS — BP 130/78 | HR 87 | Ht 66.0 in | Wt 180.0 lb

## 2021-06-29 DIAGNOSIS — I472 Ventricular tachycardia, unspecified: Secondary | ICD-10-CM | POA: Diagnosis not present

## 2021-06-29 DIAGNOSIS — I4819 Other persistent atrial fibrillation: Secondary | ICD-10-CM

## 2021-06-29 DIAGNOSIS — I428 Other cardiomyopathies: Secondary | ICD-10-CM | POA: Diagnosis not present

## 2021-06-29 DIAGNOSIS — Z79899 Other long term (current) drug therapy: Secondary | ICD-10-CM

## 2021-06-29 DIAGNOSIS — I5022 Chronic systolic (congestive) heart failure: Secondary | ICD-10-CM

## 2021-06-29 NOTE — Patient Instructions (Addendum)
Medication Instructions:  - Your physician recommends that you continue on your current medications as directed. Please refer to the Current Medication list given to you today.  *If you need a refill on your cardiac medications before your next appointment, please call your pharmacy*   Lab Work: - Your physician recommends that you have lab work today: CBC  If you have labs (blood work) drawn today and your tests are completely normal, you will receive your results only by: MyChart Message (if you have MyChart) OR A paper copy in the mail If you have any lab test that is abnormal or we need to change your treatment, we will call you to review the results.   Testing/Procedures: - Your physician has recommended that you have a Cardioversion (DCCV). Electrical Cardioversion uses a jolt of electricity to your heart either through paddles or wired patches attached to your chest. This is a controlled, usually prescheduled, procedure. Defibrillation is done under light anesthesia in the hospital, and you usually go home the day of the procedure. This is done to get your heart back into a normal rhythm. You are not awake for the procedure. Please see the instruction sheet given to you today.  You are scheduled for a Cardioversion on Tuesday 07/25/21 with Dr. Saunders Revel.   Please arrive at the [name] at 6:30 a.m. on the day of your procedure.  DIET INSTRUCTIONS:  Nothing to eat or drink after midnight the night prior to your procedure.         Labs: today (12/15)  Medications:  You may take all of your regular medications the morning of your procedure with enough water to get them down safely unless listed below:  - HOLD torsemide & spironolactone the morning of your procedure  Must have a responsible person to drive you home.  Bring a current list of your medications and current insurance cards.    If you have any questions after you get home, please call the office at [phone].  Alvis Lemmings, RN, BSN     Follow-Up: At [facility], you and your health needs are our priority.  As part of our continuing mission to provide you with exceptional heart care, we have created designated Provider Care Teams.  These Care Teams include your primary Cardiologist (physician) and Advanced Practice Providers (APPs -  Physician Assistants and Nurse Practitioners) who all work together to provide you with the care you need, when you need it.  We recommend signing up for the patient portal called "MyChart".  Sign up information is provided on this After Visit Summary.  MyChart is used to connect with patients for Virtual Visits (Telemedicine).  Patients are able to view lab/test results, encounter notes, upcoming appointments, etc.  Non-urgent messages can be sent to your provider as well.   To learn more about what you can do with MyChart, go to [email or web address].    Your next appointment:   6-8 week(s)  The format for your next appointment:   In Person  Provider:   Virl Axe, MD    Other Instructions  Electrical Cardioversion Electrical cardioversion is the delivery of a jolt of electricity to restore a normal rhythm to the heart. A rhythm that is too fast or is not regular keeps the heart from pumping well. In this procedure, sticky patches or metal paddles are placed on the chest to deliver electricity to the heart from a device. This procedure may be done in an emergency if: There  is low or no blood pressure as a result of the heart rhythm. Normal rhythm must be restored as fast as possible to protect the brain and heart from further damage. It may save a life. This may also be a scheduled procedure for irregular or fast heart rhythms that are not immediately life-threatening. Tell a health care provider about: Any allergies you have. All medicines you are taking, including vitamins, herbs, eye drops, creams, and over-the-counter medicines. Any problems you  or family members have had with anesthetic medicines. Any blood disorders you have. Any surgeries you have had. Any medical conditions you have. Whether you are pregnant or may be pregnant. What are the risks? Generally, this is a safe procedure. However, problems may occur, including: Allergic reactions to medicines. A blood clot that breaks free and travels to other parts of your body. The possible return of an abnormal heart rhythm within hours or days after the procedure. Your heart stopping (cardiac arrest). This is rare. What happens before the procedure? Medicines Your health care provider may have you start taking: Blood-thinning medicines (anticoagulants) so your blood does not clot as easily. Medicines to help stabilize your heart rate and rhythm. Ask your health care provider about: Changing or stopping your regular medicines. This is especially important if you are taking diabetes medicines or blood thinners. Taking medicines such as aspirin and ibuprofen. These medicines can thin your blood. Do not take these medicines unless your health care provider tells you to take them. Taking over-the-counter medicines, vitamins, herbs, and supplements. General instructions Follow instructions from your health care provider about eating or drinking restrictions. Plan to have someone take you home from the hospital or clinic. If you will be going home right after the procedure, plan to have someone with you for 24 hours. Ask your health care provider what steps will be taken to help prevent infection. These may include washing your skin with a germ-killing soap. What happens during the procedure?  An IV will be inserted into one of your veins. Sticky patches (electrodes) or metal paddles may be placed on your chest. You will be given a medicine to help you relax (sedative). An electrical shock will be delivered. The procedure may vary among health care providers and hospitals. What  can I expect after the procedure? Your blood pressure, heart rate, breathing rate, and blood oxygen level will be monitored until you leave the hospital or clinic. Your heart rhythm will be watched to make sure it does not change. You may have some redness on the skin where the shocks were given. Follow these instructions at home: Do not drive for 24 hours if you were given a sedative during your procedure. Take over-the-counter and prescription medicines only as told by your health care provider. Ask your health care provider how to check your pulse. Check it often. Rest for 48 hours after the procedure or as told by your health care provider. Avoid or limit your caffeine use as told by your health care provider. Keep all follow-up visits as told by your health care provider. This is important. Contact a health care provider if: You feel like your heart is beating too quickly or your pulse is not regular. You have a serious muscle cramp that does not go away. Get help right away if: You have discomfort in your chest. You are dizzy or you feel faint. You have trouble breathing or you are short of breath. Your speech is slurred. You have trouble moving an  arm or leg on one side of your body. Your fingers or toes turn cold or blue. Summary Electrical cardioversion is the delivery of a jolt of electricity to restore a normal rhythm to the heart. This procedure may be done right away in an emergency or may be a scheduled procedure if the condition is not an emergency. Generally, this is a safe procedure. After the procedure, check your pulse often as told by your health care provider. This information is not intended to replace advice given to you by your health care provider. Make sure you discuss any questions you have with your health care provider. Document Revised: 02/02/2019 Document Reviewed: 02/02/2019 Elsevier Patient Education  [place].

## 2021-06-29 NOTE — H&P (View-Only) (Signed)
Patient Care Team: Kirk Ruths, MD as PCP - General (Unknown Physician Specialty)   HPI  Austin Warren is a 80 y.o. male Seen in followup for a ventricular tachycardia with a right bundle superior axis relatively narrow QRS complex and positive concordance suggesting a septal origin and possible verapamil sensitivity.    Catheterization >> normal coronary arteries and normal left ventricular function. ECG was notable for marked reduction in voltage   Signal average Electrocardiogram was markedly abnormal.  MRI 4/16>>There is no significant change from the prior study with persistent basal inferior and inferolateral late gadolinium enhancement slightly more pronounced on the current study and a small focal late gadolinium enhancement at the point of inferior RV attachment to the LV wall.  He also underwent fat pad biopsy>> neg  ntricular hypertrophy with an ejection fraction of 45-50% with hypokinesis of the inferior apical myocardium     He had recurrent ventricular tachycardia. He was put on verapamil.  5/17 he was transferred to [facility] in [place]. Evaluation included an echo with EF of 40-45%. Cardiac MR demonstrated EF of 43% with inferolateral and apical hypokinesis with inferolateral basilar scar which was if anything not worse compared to prior MR 4/16. He underwent EP testing. No scar was found and no ablation undertaken. Ventricular flutter was inducible. VT  was not. There have been discussions regarding ICD implantation. He elected to defer that decision  Discharge off amiod 2/2 bradycardi Had recurrent tachycardia >>>  started on mexiletine and resumed amiodarone after hiatus of years -- amiodarone has been on and off because of bradycardia     He  had  stroke  11/15. He was found to be in atrial fibrillation. He was treated with TPA. Apixaban was initiated.  He has intellectual consequences and aphasia;  The patient denies chest pain, shortness of breath, nocturnal dyspnea,  orthopnea .  There have been no palpitations, lightheadedness or syncope.  Complains of edema .  Patient denies symptoms of GI intolerance, sun sensitivity, neurological symptoms attributable to amiodarone.     DATE TEST EF   4/12 LHC  No obst CAD  5/17 cMRI 43% Inferolateral subendo scar  2/21 CTA   Ca score 881 LADm-severe--FFR> 0.8  2/21 Echo  30-35%             Date Cr K TSH Hgb LFTs PFTs  6/17  0.94 3.9 2.8  25    8/18 0.9 4.0 7.5  16   1/19   4.99     2/19 0.8 4.0 3.9  18   8/19 0.86 4.6  15.5    2/20 0.9 4.0 4.87  22   10/20 1.08 4.7 6.7 16.5 23   3/21 0.9 4.4 4.55  21   9/21 0.8 4.5 5.22  21   11/22 1.0 4.3 5.22  26      DATE Rate PR interval QRSduration Dose-Amio  10/18 62  162 112 200  1/18 54 212 112 200  1/19  66  206  104  200 x 5/w  12/19 58 220 106 200 x 5/w  5/21 57 180 102 200 d ( increased 9/20 for recurrent afib)   10/21 69 220 96 200 d  11/22        Thromboembolic risk factors ( age  -2, HTN-1, TIA/CVA-2, Vasc disease -1, CHF-1) for a CHADSVASc Score of >=7    Past Medical History:  Diagnosis Date   Arthritis    Benign prostatic hypertrophy  Bursitis of left shoulder    CHF (congestive heart failure) (HCC)    Coronary artery disease 08/2019   Moderate multivessel CAD (not hemodynamically significant by CT-FFR)   Diabetes (HCC)    Dyspnea on exertion 11/01/2011   Elevated PSA    History of colon polyps    History of colon polyps    Hyperlipidemia    Hypertension    Melanoma ([place])    under arm   Microscopic hematuria    Osteoarthritis    Retroperitoneal mass    Stroke ([place]) 05/2014   TIA (transient ischemic attack)    Urinary retention    UTI (urinary tract infection)    Ventricular tachycardia    RBB/LAHB ideopathic VT, 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    Past Surgical History:  Procedure Laterality Date   CARDIOVERSION     CARDIOVERSION N/A 03/07/2018   Procedure: CARDIOVERSION;  Surgeon: Wellington Hampshire, MD;  Location: [facility]  [facility];  Service: Cardiovascular;  Laterality: N/A;   CARDIOVERSION N/A 04/06/2019   Procedure: CARDIOVERSION;  Surgeon: Wellington Hampshire, MD;  Location: [place] [facility];  Service: Cardiovascular;  Laterality: N/A;   CARDIOVERSION N/A 06/02/2019   Procedure: CARDIOVERSION;  Surgeon: Nelva Bush, MD;  Location: [facility];  Service: Cardiovascular;  Laterality: N/A;   COLONOSCOPY WITH PROPOFOL N/A 10/17/2015   Procedure: COLONOSCOPY WITH PROPOFOL;  Surgeon: Manya Silvas, MD;  Location: [place];  Service: Endoscopy;  Laterality: N/A;   COLONOSCOPY WITH PROPOFOL N/A 03/17/2019   Procedure: COLONOSCOPY WITH PROPOFOL;  Surgeon: Toledo, Benay Pike, MD;  Location: [facility];  Service: Gastroenterology;  Laterality: N/A;   ELECTROPHYSIOLOGIC STUDY N/A 12/07/2015   Procedure: V Tach Ablation;  Surgeon: Will Schreffler Leeds, MD;  Location: [place] [facility];  Service: Cardiovascular;  Laterality: N/A;   JOINT REPLACEMENT     R TKR   RADIOLOGY WITH ANESTHESIA N/A 05/18/2014   Procedure: RADIOLOGY WITH ANESTHESIA;  Surgeon: Rob Hickman, MD;  Location: [place];  Service: Radiology;  Laterality: N/A;   REPLACEMENT TOTAL KNEE     REPLACEMENT TOTAL KNEE Left    VASECTOMY      Current Outpatient Medications  Medication Sig Dispense Refill   amiodarone (PACERONE) 200 MG tablet TAKE 1 TABLET BY MOUTH DAILY 90 tablet 3   Ascorbic Acid (VITAMIN C) 500 MG tablet Take 500 mg by mouth daily.       atorvastatin (LIPITOR) 10 MG tablet Take 1 tablet by mouth daily.     carvedilol (COREG) 3.125 MG tablet TAKE 1 TABLET BY MOUTH TWICE DAILY 180 tablet 1   Cholecalciferol (VITAMIN D3) 1000 UNITS CAPS Take 1,000 Units by mouth daily.      docusate sodium (COLACE) 50 MG capsule Take 50 mg by mouth 2 (two) times daily.     ELIQUIS 5 MG TABS tablet TAKE ONE TABLET BY MOUTH EVERY 12 HOURS 180 tablet 1   finasteride (PROSCAR) 5 MG tablet TAKE 1 TABLET BY MOUTH DAILY 90 tablet 3   levothyroxine (SYNTHROID) 100 MCG  tablet Take 1 tablet (100 mcg total) by mouth daily before breakfast. 30 tablet 2   mexiletine (MEXITIL) 150 MG capsule TAKE 1 CAPSULE BY MOUTH TWICE DAILY 180 capsule 0   Multiple Vitamin (MULTIVITAMIN WITH MINERALS) TABS tablet Take 1 tablet by mouth daily. One A Day 50+     spironolactone (ALDACTONE) 25 MG tablet TAKE ONE TABLET BY MOUTH EVERY DAY 90 tablet 3   tamsulosin (FLOMAX) 0.4 MG CAPS capsule TAKE 2 CAPSULES  BY MOUTH DAILY 180 capsule 3   torsemide (DEMADEX) 10 MG tablet TAKE ONE TAB (10MG ) BY MOUTH ONCE DAILY,THOUGH IF HE GAINS 2 POUNDS IN A DAY OR 5 POUNDS IN A WEEK, HE SHOULD RETURN TO 20MG  DAILY UNTIL BACK TO BASELINE WEIGHT 90 tablet 3   No current facility-administered medications for this visit.    Allergies  Allergen Reactions   Ciprofloxacin Anaphylaxis   Levofloxacin Anaphylaxis    "throat closes"   Enalapril Maleate Swelling    Angioedema   Metoprolol Swelling    edema   Sulfa Antibiotics Rash and Other (See Comments)    Hypotension    Review of Systems negative except from HPI and PMH  Physical Exam BP 130/78 (BP Location: Left Arm, Patient Position: Sitting, Cuff Size: Normal)    Pulse 87    Ht 5\' 6"  (1.676 m)    Wt 180 lb (81.6 kg)    SpO2 98%    BMI 29.05 kg/m  Well developed and nourished in no acute distress HENT normal Neck supple with JVP-  flat   Clear Regular rate and rhythm, no murmurs or gallops Abd-soft with active BS No Clubbing cyanosis edema Skin-warm and dry A & Oriented  Grossly normal sensory and motor function  ECG atrial fib @ 87 -/.09/38  Has replaced sinus 4/22  Assessment and  Plan  Ventricular tachycardia  Low-voltage ECG  Cardiomyopathy-nonischemic abnormal signal average ECG/MRI negative fat pad biopsy  Atrial fibrillation-persistent   Stroke   Sinus bradycardia  Hypertension    Hypothyroidism-iatrogenic-treated  High Risk Medication Surveillance amiodarone   No intercurrent ventricular tachycardia,  continue amiodarone 200 mg and mexiletine 150 mg twice daily Recurrent atrial fibrillation ; continue BID Apixoban  5 mg; surveillance labs are normal.  No missed doses, so will proceed with cardioversion, reviewing hx from a few years ago thought was better even though no recent changes in functional status, although they may be hidden in the wake of his ankle fracture  Blood pressure reasonably controlled.  We will continue carvedilol 3.125 twice daily up titration of which would be limited by his bradycardia but also his spironolactone to 25 mg daily.  Pt heart failure status is stable. Continue spiro 25 and demadex 10  mg.  Electrolytes are stable.

## 2021-06-29 NOTE — Progress Notes (Signed)
Patient Care Team: Kirk Ruths, MD as PCP - General (Unknown Physician Specialty)   HPI  Austin Warren is a 80 y.o. male Seen in followup for a ventricular tachycardia with a right bundle superior axis relatively narrow QRS complex and positive concordance suggesting a septal origin and possible verapamil sensitivity.    Catheterization >> normal coronary arteries and normal left ventricular function. ECG was notable for marked reduction in voltage   Signal average Electrocardiogram was markedly abnormal.  MRI 4/16>>There is no significant change from the prior study with persistent basal inferior and inferolateral late gadolinium enhancement slightly more pronounced on the current study and a small focal late gadolinium enhancement at the point of inferior RV attachment to the LV wall.  He also underwent fat pad biopsy>> neg  ntricular hypertrophy with an ejection fraction of 45-50% with hypokinesis of the inferior apical myocardium     He had recurrent ventricular tachycardia. He was put on verapamil.  5/17 he was transferred to [facility] in [place]. Evaluation included an echo with EF of 40-45%. Cardiac MR demonstrated EF of 43% with inferolateral and apical hypokinesis with inferolateral basilar scar which was if anything not worse compared to prior MR 4/16. He underwent EP testing. No scar was found and no ablation undertaken. Ventricular flutter was inducible. VT  was not. There have been discussions regarding ICD implantation. He elected to defer that decision  Discharge off amiod 2/2 bradycardi Had recurrent tachycardia >>>  started on mexiletine and resumed amiodarone after hiatus of years -- amiodarone has been on and off because of bradycardia     He  had  stroke  11/15. He was found to be in atrial fibrillation. He was treated with TPA. Apixaban was initiated.  He has intellectual consequences and aphasia;  The patient denies chest pain, shortness of breath, nocturnal dyspnea,  orthopnea .  There have been no palpitations, lightheadedness or syncope.  Complains of edema .  Patient denies symptoms of GI intolerance, sun sensitivity, neurological symptoms attributable to amiodarone.     DATE TEST EF   4/12 LHC  No obst CAD  5/17 cMRI 43% Inferolateral subendo scar  2/21 CTA   Ca score 881 LADm-severe--FFR> 0.8  2/21 Echo  30-35%             Date Cr K TSH Hgb LFTs PFTs  6/17  0.94 3.9 2.8  25    8/18 0.9 4.0 7.5  16   1/19   4.99     2/19 0.8 4.0 3.9  18   8/19 0.86 4.6  15.5    2/20 0.9 4.0 4.87  22   10/20 1.08 4.7 6.7 16.5 23   3/21 0.9 4.4 4.55  21   9/21 0.8 4.5 5.22  21   11/22 1.0 4.3 5.22  26      DATE Rate PR interval QRSduration Dose-Amio  10/18 62  162 112 200  1/18 54 212 112 200  1/19  66  206  104  200 x 5/w  12/19 58 220 106 200 x 5/w  5/21 57 180 102 200 d ( increased 9/20 for recurrent afib)   10/21 69 220 96 200 d  11/22        Thromboembolic risk factors ( age  -2, HTN-1, TIA/CVA-2, Vasc disease -1, CHF-1) for a CHADSVASc Score of >=7    Past Medical History:  Diagnosis Date   Arthritis    Benign prostatic hypertrophy  Bursitis of left shoulder    CHF (congestive heart failure) (HCC)    Coronary artery disease 08/2019   Moderate multivessel CAD (not hemodynamically significant by CT-FFR)   Diabetes (HCC)    Dyspnea on exertion 11/01/2011   Elevated PSA    History of colon polyps    History of colon polyps    Hyperlipidemia    Hypertension    Melanoma ([place])    under arm   Microscopic hematuria    Osteoarthritis    Retroperitoneal mass    Stroke ([place]) 05/2014   TIA (transient ischemic attack)    Urinary retention    UTI (urinary tract infection)    Ventricular tachycardia    RBB/LAHB ideopathic VT, 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    Past Surgical History:  Procedure Laterality Date   CARDIOVERSION     CARDIOVERSION N/A 03/07/2018   Procedure: CARDIOVERSION;  Surgeon: Wellington Hampshire, MD;  Location: [facility]  [facility];  Service: Cardiovascular;  Laterality: N/A;   CARDIOVERSION N/A 04/06/2019   Procedure: CARDIOVERSION;  Surgeon: Wellington Hampshire, MD;  Location: [place] [facility];  Service: Cardiovascular;  Laterality: N/A;   CARDIOVERSION N/A 06/02/2019   Procedure: CARDIOVERSION;  Surgeon: Nelva Bush, MD;  Location: [facility];  Service: Cardiovascular;  Laterality: N/A;   COLONOSCOPY WITH PROPOFOL N/A 10/17/2015   Procedure: COLONOSCOPY WITH PROPOFOL;  Surgeon: Manya Silvas, MD;  Location: [place];  Service: Endoscopy;  Laterality: N/A;   COLONOSCOPY WITH PROPOFOL N/A 03/17/2019   Procedure: COLONOSCOPY WITH PROPOFOL;  Surgeon: Toledo, Benay Pike, MD;  Location: [facility];  Service: Gastroenterology;  Laterality: N/A;   ELECTROPHYSIOLOGIC STUDY N/A 12/07/2015   Procedure: V Tach Ablation;  Surgeon: Will Tufaro Leeds, MD;  Location: [place] [facility];  Service: Cardiovascular;  Laterality: N/A;   JOINT REPLACEMENT     R TKR   RADIOLOGY WITH ANESTHESIA N/A 05/18/2014   Procedure: RADIOLOGY WITH ANESTHESIA;  Surgeon: Rob Hickman, MD;  Location: [place];  Service: Radiology;  Laterality: N/A;   REPLACEMENT TOTAL KNEE     REPLACEMENT TOTAL KNEE Left    VASECTOMY      Current Outpatient Medications  Medication Sig Dispense Refill   amiodarone (PACERONE) 200 MG tablet TAKE 1 TABLET BY MOUTH DAILY 90 tablet 3   Ascorbic Acid (VITAMIN C) 500 MG tablet Take 500 mg by mouth daily.       atorvastatin (LIPITOR) 10 MG tablet Take 1 tablet by mouth daily.     carvedilol (COREG) 3.125 MG tablet TAKE 1 TABLET BY MOUTH TWICE DAILY 180 tablet 1   Cholecalciferol (VITAMIN D3) 1000 UNITS CAPS Take 1,000 Units by mouth daily.      docusate sodium (COLACE) 50 MG capsule Take 50 mg by mouth 2 (two) times daily.     ELIQUIS 5 MG TABS tablet TAKE ONE TABLET BY MOUTH EVERY 12 HOURS 180 tablet 1   finasteride (PROSCAR) 5 MG tablet TAKE 1 TABLET BY MOUTH DAILY 90 tablet 3   levothyroxine (SYNTHROID) 100 MCG  tablet Take 1 tablet (100 mcg total) by mouth daily before breakfast. 30 tablet 2   mexiletine (MEXITIL) 150 MG capsule TAKE 1 CAPSULE BY MOUTH TWICE DAILY 180 capsule 0   Multiple Vitamin (MULTIVITAMIN WITH MINERALS) TABS tablet Take 1 tablet by mouth daily. One A Day 50+     spironolactone (ALDACTONE) 25 MG tablet TAKE ONE TABLET BY MOUTH EVERY DAY 90 tablet 3   tamsulosin (FLOMAX) 0.4 MG CAPS capsule TAKE 2 CAPSULES  BY MOUTH DAILY 180 capsule 3   torsemide (DEMADEX) 10 MG tablet TAKE ONE TAB (10MG ) BY MOUTH ONCE DAILY,THOUGH IF HE GAINS 2 POUNDS IN A DAY OR 5 POUNDS IN A WEEK, HE SHOULD RETURN TO 20MG  DAILY UNTIL BACK TO BASELINE WEIGHT 90 tablet 3   No current facility-administered medications for this visit.    Allergies  Allergen Reactions   Ciprofloxacin Anaphylaxis   Levofloxacin Anaphylaxis    "throat closes"   Enalapril Maleate Swelling    Angioedema   Metoprolol Swelling    edema   Sulfa Antibiotics Rash and Other (See Comments)    Hypotension    Review of Systems negative except from HPI and PMH  Physical Exam BP 130/78 (BP Location: Left Arm, Patient Position: Sitting, Cuff Size: Normal)    Pulse 87    Ht 5\' 6"  (1.676 m)    Wt 180 lb (81.6 kg)    SpO2 98%    BMI 29.05 kg/m  Well developed and nourished in no acute distress HENT normal Neck supple with JVP-  flat   Clear Regular rate and rhythm, no murmurs or gallops Abd-soft with active BS No Clubbing cyanosis edema Skin-warm and dry A & Oriented  Grossly normal sensory and motor function  ECG atrial fib @ 87 -/.09/38  Has replaced sinus 4/22  Assessment and  Plan  Ventricular tachycardia  Low-voltage ECG  Cardiomyopathy-nonischemic abnormal signal average ECG/MRI negative fat pad biopsy  Atrial fibrillation-persistent   Stroke   Sinus bradycardia  Hypertension    Hypothyroidism-iatrogenic-treated  High Risk Medication Surveillance amiodarone   No intercurrent ventricular tachycardia,  continue amiodarone 200 mg and mexiletine 150 mg twice daily Recurrent atrial fibrillation ; continue BID Apixoban  5 mg; surveillance labs are normal.  No missed doses, so will proceed with cardioversion, reviewing hx from a few years ago thought was better even though no recent changes in functional status, although they may be hidden in the wake of his ankle fracture  Blood pressure reasonably controlled.  We will continue carvedilol 3.125 twice daily up titration of which would be limited by his bradycardia but also his spironolactone to 25 mg daily.  Pt heart failure status is stable. Continue spiro 25 and demadex 10  mg.  Electrolytes are stable.

## 2021-06-30 LAB — CBC WITH DIFFERENTIAL/PLATELET
Basophils Absolute: 0.1 10*3/uL (ref 0.0–0.2)
Basos: 1 %
EOS (ABSOLUTE): 0.2 10*3/uL (ref 0.0–0.4)
Eos: 2 %
Hematocrit: 46.9 % (ref 37.5–51.0)
Hemoglobin: 15.6 g/dL (ref 13.0–17.7)
Immature Grans (Abs): 0 10*3/uL (ref 0.0–0.1)
Immature Granulocytes: 1 %
Lymphocytes Absolute: 0.8 10*3/uL (ref 0.7–3.1)
Lymphs: 13 %
MCH: 32.5 pg (ref 26.6–33.0)
MCHC: 33.3 g/dL (ref 31.5–35.7)
MCV: 98 fL — ABNORMAL HIGH (ref 79–97)
Monocytes Absolute: 0.7 10*3/uL (ref 0.1–0.9)
Monocytes: 11 %
Neutrophils Absolute: 4.7 10*3/uL (ref 1.4–7.0)
Neutrophils: 72 %
Platelets: 141 10*3/uL — ABNORMAL LOW (ref 150–450)
RBC: 4.8 x10E6/uL (ref 4.14–5.80)
RDW: 12.4 % (ref 11.6–15.4)
WBC: 6.5 10*3/uL (ref 3.4–10.8)

## 2021-07-11 ENCOUNTER — Ambulatory Visit: Payer: Self-pay | Admitting: Urology

## 2021-07-25 ENCOUNTER — Ambulatory Visit
Admission: RE | Admit: 2021-07-25 | Discharge: 2021-07-25 | Disposition: A | Discharge status: Home / Self Care | Payer: Medicare Other | Attending: Internal Medicine | Admitting: Internal Medicine

## 2021-07-25 ENCOUNTER — Ambulatory Visit: Payer: Medicare Other | Admitting: Registered Nurse

## 2021-07-25 ENCOUNTER — Encounter: Payer: Self-pay | Admitting: Internal Medicine

## 2021-07-25 ENCOUNTER — Encounter
Admission: RE | Disposition: A | Discharge status: Home / Self Care | Payer: Self-pay | Source: Home / Self Care | Attending: Internal Medicine

## 2021-07-25 DIAGNOSIS — I69398 Other sequelae of cerebral infarction: Secondary | ICD-10-CM | POA: Diagnosis not present

## 2021-07-25 DIAGNOSIS — Z87891 Personal history of nicotine dependence: Secondary | ICD-10-CM | POA: Insufficient documentation

## 2021-07-25 DIAGNOSIS — I69320 Aphasia following cerebral infarction: Secondary | ICD-10-CM | POA: Insufficient documentation

## 2021-07-25 DIAGNOSIS — N400 Enlarged prostate without lower urinary tract symptoms: Secondary | ICD-10-CM | POA: Diagnosis not present

## 2021-07-25 DIAGNOSIS — E785 Hyperlipidemia, unspecified: Secondary | ICD-10-CM | POA: Diagnosis not present

## 2021-07-25 DIAGNOSIS — R200 Anesthesia of skin: Secondary | ICD-10-CM | POA: Diagnosis not present

## 2021-07-25 DIAGNOSIS — I509 Heart failure, unspecified: Secondary | ICD-10-CM | POA: Insufficient documentation

## 2021-07-25 DIAGNOSIS — E119 Type 2 diabetes mellitus without complications: Secondary | ICD-10-CM | POA: Insufficient documentation

## 2021-07-25 DIAGNOSIS — I4819 Other persistent atrial fibrillation: Secondary | ICD-10-CM | POA: Diagnosis present

## 2021-07-25 DIAGNOSIS — I428 Other cardiomyopathies: Secondary | ICD-10-CM | POA: Diagnosis not present

## 2021-07-25 DIAGNOSIS — R001 Bradycardia, unspecified: Secondary | ICD-10-CM | POA: Diagnosis not present

## 2021-07-25 DIAGNOSIS — Z85820 Personal history of malignant melanoma of skin: Secondary | ICD-10-CM | POA: Diagnosis not present

## 2021-07-25 DIAGNOSIS — E032 Hypothyroidism due to medicaments and other exogenous substances: Secondary | ICD-10-CM | POA: Insufficient documentation

## 2021-07-25 DIAGNOSIS — Z79899 Other long term (current) drug therapy: Secondary | ICD-10-CM | POA: Diagnosis not present

## 2021-07-25 DIAGNOSIS — I69318 Other symptoms and signs involving cognitive functions following cerebral infarction: Secondary | ICD-10-CM | POA: Diagnosis not present

## 2021-07-25 DIAGNOSIS — Z7901 Long term (current) use of anticoagulants: Secondary | ICD-10-CM | POA: Diagnosis not present

## 2021-07-25 DIAGNOSIS — I110 Hypertensive heart disease with heart failure: Secondary | ICD-10-CM | POA: Diagnosis not present

## 2021-07-25 DIAGNOSIS — I472 Ventricular tachycardia, unspecified: Secondary | ICD-10-CM | POA: Insufficient documentation

## 2021-07-25 HISTORY — PX: CARDIOVERSION: SHX1299

## 2021-07-25 SURGERY — CARDIOVERSION
Anesthesia: General

## 2021-07-25 MED ORDER — SODIUM CHLORIDE 0.9 % IV SOLN: INTRAVENOUS | Status: DC

## 2021-07-25 MED ORDER — PROPOFOL 10 MG/ML IV BOLUS
INTRAVENOUS | Status: DC | PRN
  Administered 2021-07-25: 70 mg via INTRAVENOUS

## 2021-07-25 NOTE — Interval H&P Note (Signed)
History and Physical Interval Note:  07/25/2021 7:05 AM  Austin Warren  has presented today for surgery, with the diagnosis of atrial fibrillation.  The various methods of treatment have been discussed with the patient and family. After consideration of risks, benefits and other options for treatment, the patient has consented to  Procedure(s): CARDIOVERSION (N/A) as a surgical intervention.  The patient's history has been reviewed, patient examined, no change in status, stable for surgery.  I have reviewed the patient's chart and labs.  Questions were answered to the patient's satisfaction.     [name]

## 2021-07-25 NOTE — CV Procedure (Signed)
° ° °  Cardioversion Note  Austin Warren [id number] 1940/11/29  Procedure: DC Cardioversion Indications: Persistent atrial fibrillation  Procedure Details Consent: Obtained Time Out: Verified patient identification, verified procedure, site/side was marked, verified correct patient position, special equipment/implants available, Radiology Safety Procedures followed,  medications/allergies/relevent history reviewed, required imaging and test results available.  Performed  The patient has been on adequate anticoagulation.  The patient received IV propofol by anesthesia for sedation.  Synchronous cardioversion was performed at 200 joules x 1.  The cardioversion was successful with restoration of normal sinus rhythm.  Complications: No apparent complications Patient did tolerate procedure well.  Nelva Bush., MD 07/25/2021, 7:48 AM

## 2021-07-25 NOTE — Anesthesia Preprocedure Evaluation (Signed)
Anesthesia Evaluation  Patient identified by MRN, date of birth, ID band Patient awake    Reviewed: Allergy & Precautions, NPO status , Patient's Chart, lab work & pertinent test results  History of Anesthesia Complications Negative for: history of anesthetic complications  Airway Mallampati: III  TM Distance: <3 FB Neck ROM: limited    Dental  (+) Poor Dentition, Chipped, Missing   Pulmonary neg sleep apnea, neg COPD, former smoker,           Cardiovascular hypertension, Pt. on medications +CHF (EF 40-45%)  (-) CAD, (-) Past MI, (-) Cardiac Stents and (-) CABG + dysrhythmias Atrial Fibrillation  - Systolic murmurs and - Diastolic murmurs    Neuro/Psych neg Seizures TIACVA (R sided numbness), Residual Symptoms negative psych ROS   GI/Hepatic negative GI ROS, Neg liver ROS,   Endo/Other  diabetes  Renal/GU Renal disease     Musculoskeletal  (+) Arthritis ,   Abdominal (+) - obese,   Peds  Hematology negative hematology ROS (+)   Anesthesia Other Findings Past Medical History: No date: Arthritis No date: Benign prostatic hypertrophy No date: Bursitis of left shoulder No date: CHF (congestive heart failure) (HCC) No date: Diabetes ([place]) 11/01/2011: Dyspnea on exertion No date: Elevated PSA No date: History of colon polyps No date: History of colon polyps No date: Hyperlipidemia No date: Hypertension No date: Melanoma ([place])     Comment:  under arm No date: Microscopic hematuria No date: Osteoarthritis No date: Retroperitoneal mass 05/2014: Stroke ([place]) No date: TIA (transient ischemic attack) No date: Urinary retention No date: UTI (urinary tract infection) No date: Ventricular tachycardia (HCC)     Comment:  RBB/LAHB ideopathic VT, 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   Reproductive/Obstetrics                             Anesthesia Physical  Anesthesia Plan  ASA: 4  Anesthesia Plan:  General   Post-op Pain Management:    Induction: Intravenous  PONV Risk Score and Plan: 2 and Propofol infusion and TIVA  Airway Management Planned: Natural Airway and Nasal Cannula  Additional Equipment:   Intra-op Plan:   Post-operative Plan:   Informed Consent: I have reviewed the patients History and Physical, chart, labs and discussed the procedure including the risks, benefits and alternatives for the proposed anesthesia with the patient or authorized representative who has indicated his/her understanding and acceptance.     Dental advisory given  Plan Discussed with: CRNA and Anesthesiologist  Anesthesia Plan Comments: (Patient consented for risks of anesthesia including but not limited to:  - adverse reactions to medications - risk of intubation if required - damage to teeth, lips or other oral mucosa - sore throat or hoarseness - Damage to heart, brain, lungs or loss of life  Patient voiced understanding.)        Anesthesia Quick Evaluation

## 2021-07-25 NOTE — Anesthesia Postprocedure Evaluation (Signed)
Anesthesia Post Note  Patient: Austin Warren  Procedure(s) Performed: CARDIOVERSION  Patient location during evaluation: Specials Recovery Anesthesia Type: General Level of consciousness: awake and alert Pain management: pain level controlled Vital Signs Assessment: post-procedure vital signs reviewed and stable Respiratory status: spontaneous breathing, nonlabored ventilation, respiratory function stable and patient connected to nasal cannula oxygen Cardiovascular status: blood pressure returned to baseline and stable Postop Assessment: no apparent nausea or vomiting Anesthetic complications: no   No notable events documented.   Last Vitals:  Vitals:   07/25/21 0800 07/25/21 0815  BP: (!) 97/56 (!) 92/56  Pulse: 65 67  Resp: 20 17  Temp:    SpO2: 98% 93%    Last Pain:  Vitals:   07/25/21 0815  TempSrc:   PainSc: 0-No pain                 Martha Clan

## 2021-07-25 NOTE — Transfer of Care (Signed)
Immediate Anesthesia Transfer of Care Note  Patient: Austin Warren  Procedure(s) Performed: CARDIOVERSION  Patient Location: PACU  Anesthesia Type:General  Level of Consciousness: sedated  Airway & Oxygen Therapy: Patient Spontanous Breathing  Post-op Assessment: Report given to RN and Post -op Vital signs reviewed and stable  Post vital signs: Reviewed and stable  Last Vitals:  Vitals Value Taken Time  BP 107/71 07/25/21 0740  Temp    Pulse 78 07/25/21 0740  Resp 17 07/25/21 0740  SpO2 91 % 07/25/21 0740  Vitals shown include unvalidated device data.  Last Pain:  Vitals:   07/25/21 0740  TempSrc:   PainSc: 0-No pain         Complications: No notable events documented.

## 2021-08-17 ENCOUNTER — Other Ambulatory Visit: Payer: Self-pay

## 2021-08-17 ENCOUNTER — Ambulatory Visit: Payer: Medicare Other | Admitting: Internal Medicine

## 2021-08-17 VITALS — BP 122/76 | HR 75 | Ht 66.0 in | Wt 181.4 lb

## 2021-08-17 DIAGNOSIS — Z79899 Other long term (current) drug therapy: Secondary | ICD-10-CM

## 2021-08-17 DIAGNOSIS — I472 Ventricular tachycardia, unspecified: Secondary | ICD-10-CM

## 2021-08-17 DIAGNOSIS — R001 Bradycardia, unspecified: Secondary | ICD-10-CM

## 2021-08-17 DIAGNOSIS — I5022 Chronic systolic (congestive) heart failure: Secondary | ICD-10-CM | POA: Diagnosis not present

## 2021-08-17 DIAGNOSIS — I4819 Other persistent atrial fibrillation: Secondary | ICD-10-CM | POA: Diagnosis not present

## 2021-08-17 DIAGNOSIS — I428 Other cardiomyopathies: Secondary | ICD-10-CM | POA: Diagnosis not present

## 2021-08-17 NOTE — Patient Instructions (Signed)

## 2021-08-17 NOTE — Progress Notes (Signed)
Patient Care Team: Kirk Ruths, MD as PCP - General (Unknown Physician Specialty)   HPI  Austin Warren is a 81 y.o. male Seen in followup for a ventricular tachycardia with a right bundle superior axis relatively narrow QRS complex and positive concordance suggesting a septal origin and possible verapamil sensitivity.   Also recurrent atrial fibrillation for which underwent cardioversion (1/23)  Work-up for his ventricular tachycardia demonstrated normal LV function no obstructive coronary disease  Signal average Electrocardiogram was markedly abnormal.  MRI 4/16>>There is no significant change from the prior study with persistent basal inferior and inferolateral late gadolinium enhancement slightly more pronounced on the current study and a small focal late gadolinium enhancement at the point of inferior RV attachment to the LV wall.  He also underwent fat pad biopsy>> neg    He had recurrent ventricular tachycardia. He was put on verapamil.  5/17 he was transferred to [facility] in [place]. Evaluation included an echo with EF of 40-45%. Cardiac MR demonstrated EF of 43% with inferolateral and apical hypokinesis with inferolateral basilar scar which was if anything not worse compared to prior MR 4/16. He underwent EP testing. No scar was found and no ablation undertaken. Ventricular flutter was inducible. VT  was not. There have been discussions regarding ICD implantation. He elected to defer that decision  Discharge off amiod 2/2 bradycardi Had recurrent tachycardia >>>  started on mexiletine and resumed amiodarone after hiatus of years -- amiodarone has been on and off because of bradycardia     He  had  stroke  11/15. He was found to be in atrial fibrillation. He was treated with TPA. Apixaban was initiated.  He has intellectual consequences and aphasia;    The patient denies chest pain, shortness of breath, nocturnal dyspnea, orthopnea or peripheral edema.  There have been no  palpitations, lightheadedness or syncope.  Complains of fatigue but exercise is largely limited not by diet or by his dyspnea but by his right leg which is becoming increasingly impaired.  Has had 3+ falls.  Including the fracture of his ankle in September.  Now walking with a cane outside and his walker inside.   Patient denies symptoms of GI intolerance, sun sensitivity, neurological symptoms attributable to amiodarone.     DATE TEST EF   4/12 LHC  No obst CAD  5/17 cMRI 43% Inferolateral subendo scar  2/21 CTA   Ca score 881 LADm-severe--FFR> 0.8  2/21 Echo  30-35%             Date Cr K TSH Hgb LFTs PFTs  6/17  0.94 3.9 2.8  25    8/18 0.9 4.0 7.5  16   1/19   4.99     2/19 0.8 4.0 3.9  18   8/19 0.86 4.6  15.5    2/20 0.9 4.0 4.87  22   10/20 1.08 4.7 6.7 16.5 23   3/21 0.9 4.4 4.55  21   9/21 0.8 4.5 5.22  21   11/22 1.0 4.3 5.22  26      DATE Rate PR interval QRSduration Dose-Amio  10/18 62  162 112 200  1/18 54 212 112 200  1/19  66  206  104  200 x 5/w  12/19 58 220 106 200 x 5/w  5/21 57 180 102 200 d ( increased 9/20 for recurrent afib)   10/21 69 220 96 200 d  11/22        Thromboembolic risk  factors ( age  -2, HTN-1, TIA/CVA-2, Vasc disease -1, CHF-1) for a CHADSVASc Score of >=7    Past Medical History:  Diagnosis Date   Arthritis    Benign prostatic hypertrophy    Bursitis of left shoulder    CHF (congestive heart failure) (HCC)    Coronary artery disease 08/2019   Moderate multivessel CAD (not hemodynamically significant by CT-FFR)   Diabetes (HCC)    Dyspnea on exertion 11/01/2011   Elevated PSA    History of colon polyps    History of colon polyps    Hyperlipidemia    Hypertension    Melanoma ([place])    under arm   Microscopic hematuria    Osteoarthritis    Retroperitoneal mass    Stroke ([place]) 05/2014   TIA (transient ischemic attack)    Urinary retention    UTI (urinary tract infection)    Ventricular tachycardia    RBB/LAHB  ideopathic VT, 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    Past Surgical History:  Procedure Laterality Date   CARDIOVERSION     CARDIOVERSION N/A 03/07/2018   Procedure: CARDIOVERSION;  Surgeon: Wellington Hampshire, MD;  Location: [facility];  Service: Cardiovascular;  Laterality: N/A;   CARDIOVERSION N/A 04/06/2019   Procedure: CARDIOVERSION;  Surgeon: Wellington Hampshire, MD;  Location: [place] [facility];  Service: Cardiovascular;  Laterality: N/A;   CARDIOVERSION N/A 06/02/2019   Procedure: CARDIOVERSION;  Surgeon: Nelva Bush, MD;  Location: [place] [facility];  Service: Cardiovascular;  Laterality: N/A;   CARDIOVERSION N/A 07/25/2021   Procedure: CARDIOVERSION;  Surgeon: Nelva Bush, MD;  Location: [facility];  Service: Cardiovascular;  Laterality: N/A;   COLONOSCOPY WITH PROPOFOL N/A 10/17/2015   Procedure: COLONOSCOPY WITH PROPOFOL;  Surgeon: Manya Silvas, MD;  Location: [place];  Service: Endoscopy;  Laterality: N/A;   COLONOSCOPY WITH PROPOFOL N/A 03/17/2019   Procedure: COLONOSCOPY WITH PROPOFOL;  Surgeon: Toledo, Benay Pike, MD;  Location: [facility];  Service: Gastroenterology;  Laterality: N/A;   ELECTROPHYSIOLOGIC STUDY N/A 12/07/2015   Procedure: V Tach Ablation;  Surgeon: Will Goheen Leeds, MD;  Location: [place] [facility];  Service: Cardiovascular;  Laterality: N/A;   JOINT REPLACEMENT     R TKR   RADIOLOGY WITH ANESTHESIA N/A 05/18/2014   Procedure: RADIOLOGY WITH ANESTHESIA;  Surgeon: Rob Hickman, MD;  Location: [place];  Service: Radiology;  Laterality: N/A;   REPLACEMENT TOTAL KNEE     REPLACEMENT TOTAL KNEE Left    VASECTOMY      Current Outpatient Medications  Medication Sig Dispense Refill   amiodarone (PACERONE) 200 MG tablet TAKE 1 TABLET BY MOUTH DAILY 90 tablet 3   Ascorbic Acid (VITAMIN C) 500 MG tablet Take 500 mg by mouth daily.       atorvastatin (LIPITOR) 10 MG tablet Take 10 mg by mouth daily.     carvedilol (COREG) 3.125 MG tablet TAKE 1 TABLET BY MOUTH TWICE  DAILY 180 tablet 1   Cholecalciferol (VITAMIN D3) 1000 UNITS CAPS Take 1,000 Units by mouth daily.      docusate sodium (COLACE) 50 MG capsule Take 50 mg by mouth 2 (two) times daily.     ELIQUIS 5 MG TABS tablet TAKE ONE TABLET BY MOUTH EVERY 12 HOURS 180 tablet 1   finasteride (PROSCAR) 5 MG tablet TAKE 1 TABLET BY MOUTH DAILY 90 tablet 3   levothyroxine (SYNTHROID) 100 MCG tablet Take 1 tablet (100 mcg total) by mouth daily before breakfast. 30 tablet 2   mexiletine (MEXITIL)  150 MG capsule TAKE 1 CAPSULE BY MOUTH TWICE DAILY 180 capsule 0   Multiple Vitamin (MULTIVITAMIN WITH MINERALS) TABS tablet Take 1 tablet by mouth daily. One A Day 50+     spironolactone (ALDACTONE) 25 MG tablet TAKE ONE TABLET BY MOUTH EVERY DAY 90 tablet 3   tamsulosin (FLOMAX) 0.4 MG CAPS capsule TAKE 2 CAPSULES BY MOUTH DAILY 180 capsule 3   torsemide (DEMADEX) 10 MG tablet TAKE ONE TAB (10MG ) BY MOUTH ONCE DAILY,THOUGH IF HE GAINS 2 POUNDS IN A DAY OR 5 POUNDS IN A WEEK, HE SHOULD RETURN TO 20MG  DAILY UNTIL BACK TO BASELINE WEIGHT 90 tablet 3   No current facility-administered medications for this visit.    Allergies  Allergen Reactions   Ciprofloxacin Anaphylaxis   Levofloxacin Anaphylaxis    "throat closes"   Enalapril Maleate Swelling    Angioedema   Metoprolol Swelling    edema   Sulfa Antibiotics Rash and Other (See Comments)    Hypotension    Review of Systems negative except from HPI and PMH  Physical Exam BP 122/76 (BP Location: Right Arm, Patient Position: Sitting, Cuff Size: Normal)    Pulse 75    Ht 5\' 6"  (1.676 m)    Wt 181 lb 6 oz (82.3 kg)    SpO2 97%    BMI 29.27 kg/m  Well developed and nourished in no acute distress HENT normal Neck supple with JVP-  flat   Clear Irregularly irregular rate and rhythm no murmurs or gallops Abd-soft with active BS No Clubbing cyanosis edema Skin-warm and dry A & Oriented hip flexion on the right is markedly limited.  ECG atrial fibrillation at  75 Normal-/10/38  12.22 ECG atrial fib @ 87 -/.09/38  Has replaced sinus 4/22  Assessment and  Plan  Ventricular tachycardia  Low-voltage ECG  Cardiomyopathy-nonischemic abnormal signal average ECG/MRI negative fat pad biopsy  Atrial fibrillation-permanent  Stroke   Sinus bradycardia  Hypertension    Hypothyroidism-iatrogenic-treated  High Risk Medication Surveillance amiodarone   No intercurrent ventricular tachycardia.  We will continue with amiodarone 200 mg a day and concomitant carvedilol 3.125 and mexiletine 150 twice daily.  Atrial fibrillation we will now designate his permanent and continue anticoagulation with Eliquis 5 twice daily  Lengthy discussion regarding recurrent falls and the importance of exercise with this.  We will follow-up with [place] personal trainer [place] and if need be can follow-up with Dr. Ouida Sills for PT referral.  Mentioned CUBII.

## 2021-09-11 ENCOUNTER — Other Ambulatory Visit: Payer: Self-pay | Admitting: Internal Medicine

## 2021-10-16 ENCOUNTER — Other Ambulatory Visit: Payer: Self-pay | Admitting: Urology

## 2021-10-19 ENCOUNTER — Ambulatory Visit (INDEPENDENT_AMBULATORY_CARE_PROVIDER_SITE_OTHER): Payer: Medicare Other

## 2021-10-19 ENCOUNTER — Encounter: Payer: Self-pay | Admitting: Internal Medicine

## 2021-10-19 ENCOUNTER — Telehealth: Payer: Self-pay | Admitting: Internal Medicine

## 2021-10-19 ENCOUNTER — Other Ambulatory Visit
Admission: RE | Admit: 2021-10-19 | Discharge: 2021-10-19 | Disposition: A | Discharge status: Home / Self Care | Payer: Medicare Other | Source: Ambulatory Visit | Attending: Internal Medicine | Admitting: Internal Medicine

## 2021-10-19 ENCOUNTER — Ambulatory Visit: Payer: Medicare Other | Admitting: Internal Medicine

## 2021-10-19 VITALS — BP 110/70 | HR 91 | Ht 66.0 in | Wt 174.4 lb

## 2021-10-19 DIAGNOSIS — I428 Other cardiomyopathies: Secondary | ICD-10-CM

## 2021-10-19 DIAGNOSIS — I2510 Atherosclerotic heart disease of native coronary artery without angina pectoris: Secondary | ICD-10-CM

## 2021-10-19 DIAGNOSIS — I4821 Permanent atrial fibrillation: Secondary | ICD-10-CM

## 2021-10-19 DIAGNOSIS — I5022 Chronic systolic (congestive) heart failure: Secondary | ICD-10-CM

## 2021-10-19 DIAGNOSIS — I472 Ventricular tachycardia, unspecified: Secondary | ICD-10-CM

## 2021-10-19 DIAGNOSIS — R0602 Shortness of breath: Secondary | ICD-10-CM

## 2021-10-19 LAB — COMPREHENSIVE METABOLIC PANEL
ALT: 22 U/L (ref 0–44)
AST: 29 U/L (ref 15–41)
Albumin: 3.3 g/dL — ABNORMAL LOW (ref 3.5–5.0)
Alkaline Phosphatase: 169 U/L — ABNORMAL HIGH (ref 38–126)
Anion gap: 13 (ref 5–15)
BUN: 20 mg/dL (ref 8–23)
CO2: 22 mmol/L (ref 22–32)
Calcium: 8.8 mg/dL — ABNORMAL LOW (ref 8.9–10.3)
Chloride: 99 mmol/L (ref 98–111)
Creatinine, Ser: 0.98 mg/dL (ref 0.61–1.24)
GFR, Estimated: >60 mL/min (ref >60)
Glucose, Bld: 107 mg/dL — ABNORMAL HIGH (ref 70–99)
Potassium: 4.1 mmol/L (ref 3.5–5.1)
Sodium: 134 mmol/L — ABNORMAL LOW (ref 135–145)
Total Bilirubin: 1.8 mg/dL — ABNORMAL HIGH (ref 0.3–1.2)
Total Protein: 7.8 g/dL (ref 6.5–8.1)

## 2021-10-19 LAB — BRAIN NATRIURETIC PEPTIDE: B Natriuretic Peptide: 837.4 pg/mL — ABNORMAL HIGH (ref 0.0–100.0)

## 2021-10-19 LAB — CBC
HCT: 45 % (ref 39.0–52.0)
Hemoglobin: 15.1 g/dL (ref 13.0–17.0)
MCH: 33 pg (ref 26.0–34.0)
MCHC: 33.6 g/dL (ref 30.0–36.0)
MCV: 98.3 fL (ref 80.0–100.0)
Platelets: 219 10*3/uL (ref 150–400)
RBC: 4.58 MIL/uL (ref 4.22–5.81)
RDW: 13.2 % (ref 11.5–15.5)
WBC: 8.5 10*3/uL (ref 4.0–10.5)
nRBC: 0 % (ref 0.0–0.2)

## 2021-10-19 LAB — ECHOCARDIOGRAM COMPLETE
AR max vel: 2.75 cm2
AV Area VTI: 2.09 cm2
AV Area mean vel: 2.15 cm2
AV Mean grad: 2 mmHg
AV Peak grad: 3 mmHg
Ao pk vel: 0.86 m/s
Height: 66 in
S' Lateral: 5.9 cm
Single Plane A4C EF: 19.1 %
Weight: 2790 oz

## 2021-10-19 LAB — TSH: TSH: 5.119 u[IU]/mL — ABNORMAL HIGH (ref 0.350–4.500)

## 2021-10-19 MED ORDER — PERFLUTREN LIPID MICROSPHERE
1.0000 mL | INTRAVENOUS | Status: AC | PRN
  Administered 2021-10-19: 2 mL via INTRAVENOUS

## 2021-10-19 NOTE — Progress Notes (Signed)
? ?Follow-up Outpatient Visit ?Date: 10/19/2021 ? ?Primary Care Provider: ?Kirk Ruths, MD ?[address] ?[place] [address] ? ?Chief Complaint: Shortness of breath ? ?HPI:  Austin Warren is a 81 y.o. male with history of nonobstructive coronary artery disease, persistent atrial fibrillation/flutter, recurrent ventricular tachycardia, chronic HFrEF due to nonischemic cardiomyopathy, hypertension, hyperlipidemia, stroke, and hypothyroidism, who presents for follow-up of HFrEF, nonobstructive coronary artery disease, and atrial fibrillation.  I last saw him about a year ago, at which time he was doing well from a heart standpoint.  He has subsequently seen Dr. Caryl Comes with recurrence of atrial fibrillation noted in December.  He underwent cardioversion in January but had reverted back to atrial fibrillation by the time of his follow-up in early February with Dr. Caryl Comes.  Decision was made to follow a rate control strategy going forward in the setting of permanent atrial fibrillation.  Notably, he has remained on amiodarone and mexiletine for treatment of his recurrent ventricular tachycardia. ? ?Today, Austin Warren reports that he has been feeling more short of breath with activity over the last 3 weeks.  He has also noticed some elevated heart rates at rest.  His tachycardia and dyspnea seems to be worse when he is wearing a tight belt.  He notes some dizziness when he gets in the bed but has otherwise not felt lightheaded.  He denies chest pain and significant edema, though he still has some mild swelling about the right ankle after fracturing it last September.  Home blood pressures are typically around 110/70.  His heart rates are usually around 80 bpm. ? ?-------------------------------------------------------------------------------------------------- ? ?Past Medical History:  ?Diagnosis Date  ? Arthritis   ? Benign prostatic hypertrophy   ? Bursitis of left shoulder   ? CHF  (congestive heart failure) ([place])   ? Coronary artery disease 08/2019  ? Moderate multivessel CAD (not hemodynamically significant by CT-FFR)  ? Diabetes ([place])   ? Dyspnea on exertion 11/01/2011  ? Elevated PSA   ? History of colon polyps   ? History of colon polyps   ? Hyperlipidemia   ? Hypertension   ? Melanoma ([place])   ? under arm  ? Microscopic hematuria   ? Osteoarthritis   ? Retroperitoneal mass   ? Stroke [facility]) 05/2014  ? TIA (transient ischemic attack)   ? Urinary retention   ? UTI (urinary tract infection)   ? Ventricular tachycardia ([place])   ? RBB/LAHB ideopathic VT, 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  ? ?Past Surgical History:  ?Procedure Laterality Date  ? CARDIOVERSION    ? CARDIOVERSION N/A 03/07/2018  ? Procedure: CARDIOVERSION;  Surgeon: Wellington Hampshire, MD;  Location: [facility];  Service: Cardiovascular;  Laterality: N/A;  ? CARDIOVERSION N/A 04/06/2019  ? Procedure: CARDIOVERSION;  Surgeon: Wellington Hampshire, MD;  Location: [facility];  Service: Cardiovascular;  Laterality: N/A;  ? CARDIOVERSION N/A 06/02/2019  ? Procedure: CARDIOVERSION;  Surgeon: Nelva Bush, MD;  Location: [facility];  Service: Cardiovascular;  Laterality: N/A;  ? CARDIOVERSION N/A 07/25/2021  ? Procedure: CARDIOVERSION;  Surgeon: Nelva Bush, MD;  Location: [facility];  Service: Cardiovascular;  Laterality: N/A;  ? COLONOSCOPY WITH PROPOFOL N/A 10/17/2015  ? Procedure: COLONOSCOPY WITH PROPOFOL;  Surgeon: Manya Silvas, MD;  Location: [facility];  Service: Endoscopy;  Laterality: N/A;  ? COLONOSCOPY WITH PROPOFOL N/A 03/17/2019  ? Procedure: COLONOSCOPY WITH PROPOFOL;  Surgeon: Toledo, Benay Pike, MD;  Location: [facility];  Service: Gastroenterology;  Laterality:  N/A;  ? ELECTROPHYSIOLOGIC STUDY N/A 12/07/2015  ? Procedure: V Tach Ablation;  Surgeon: Will Isabell Leeds, MD;  Location: [place] [facility];  Service: Cardiovascular;  Laterality: N/A;  ? JOINT REPLACEMENT    ? R TKR  ? RADIOLOGY WITH ANESTHESIA N/A 05/18/2014  ?  Procedure: RADIOLOGY WITH ANESTHESIA;  Surgeon: Rob Hickman, MD;  Location: [place];  Service: Radiology;  Laterality: N/A;  ? REPLACEMENT TOTAL KNEE    ? REPLACEMENT TOTAL KNEE Left   ? VASECTOMY    ? ? ?Current Meds  ?Medication Sig  ? amiodarone (PACERONE) 200 MG tablet TAKE 1 TABLET BY MOUTH DAILY  ? Ascorbic Acid (VITAMIN C) 500 MG tablet Take 500 mg by mouth daily.    ? atorvastatin (LIPITOR) 10 MG tablet Take 10 mg by mouth daily.  ? carvedilol (COREG) 3.125 MG tablet TAKE 1 TABLET BY MOUTH TWICE DAILY  ? Cholecalciferol (VITAMIN D3) 1000 UNITS CAPS Take 1,000 Units by mouth daily.   ? docusate sodium (COLACE) 50 MG capsule Take 50 mg by mouth 2 (two) times daily.  ? ELIQUIS 5 MG TABS tablet TAKE ONE TABLET BY MOUTH EVERY 12 HOURS  ? finasteride (PROSCAR) 5 MG tablet TAKE 1 TABLET BY MOUTH DAILY  ? levothyroxine (SYNTHROID) 100 MCG tablet Take 1 tablet (100 mcg total) by mouth daily before breakfast.  ? mexiletine (MEXITIL) 150 MG capsule TAKE 1 CAPSULE BY MOUTH TWICE DAILY  ? Multiple Vitamin (MULTIVITAMIN WITH MINERALS) TABS tablet Take 1 tablet by mouth daily. One A Day 50+  ? spironolactone (ALDACTONE) 25 MG tablet TAKE ONE TABLET BY MOUTH EVERY DAY  ? tamsulosin (FLOMAX) 0.4 MG CAPS capsule TAKE 2 CAPSULES BY MOUTH DAILY  ? torsemide (DEMADEX) 10 MG tablet TAKE ONE TAB ('10MG'$ ) BY MOUTH ONCE DAILY,THOUGH IF HE GAINS 2 POUNDS IN A DAY OR 5 POUNDS IN A WEEK, HE SHOULD RETURN TO '20MG'$  DAILY UNTIL BACK TO BASELINE WEIGHT  ? ? ?Allergies: Ciprofloxacin, Levofloxacin, Enalapril maleate, Metoprolol, and Sulfa antibiotics ? ?Social History  ? ?Tobacco Use  ? Smoking status: Former  ?  Packs/day: 2.00  ?  Years: 10.00  ?  Pack years: 20.00  ?  Types: Cigarettes  ?  Start date: 02/10/1960  ?  Quit date: 02/09/1970  ?  Years since quitting: 51.7  ? Smokeless tobacco: Never  ? Tobacco comments:  ?  05/2014    quit smoking  45 years ago  ?Vaping Use  ? Vaping Use: Never used  ?Substance Use Topics  ? Alcohol use:  Yes  ?  Comment: 4-5 beers a day several days per week  ? Drug use: No  ? ? ?Family History  ?Problem Relation Age of Onset  ? Stroke Father   ? Hypertension Father   ? Heart attack Neg Hx   ? Kidney disease Neg Hx   ? Prostate cancer Neg Hx   ? Kidney cancer Neg Hx   ? Bladder Cancer Neg Hx   ? ? ?Review of Systems: ?A 12-system review of systems was performed and was negative except as noted in the HPI. ? ?-------------------------------------------------------------------------------------------------- ? ?Physical Exam: ?BP 110/70 (BP Location: Left Arm, Patient Position: Sitting, Cuff Size: Normal)   Pulse 91   Ht '5\' 6"'$  (1.676 m)   Wt 174 lb 6 oz (79.1 kg)   SpO2 95%   BMI 28.14 kg/m?  ? ?General:  NAD. ?Neck: No JVD or HJR. ?Lungs: Clear to auscultation bilaterally without wheezes or crackles. ?Heart:  Irregularly irregular rhythm with 1/6 systolic murmur.  No rubs or gallops. ?Abdomen: Soft, nontender, nondistended. ?Extremities: Trace ankle edema, right greater than left. ? ?EKG: Atrial flutter with variable block versus coarse atrial fibrillation with low voltage.  Ventricular rate 91 bpm. ? ?Lab Results  ?Component Value Date  ? WBC 6.5 06/29/2021  ? HGB 15.6 06/29/2021  ? HCT 46.9 06/29/2021  ? MCV 98 (H) 06/29/2021  ? PLT 141 (L) 06/29/2021  ? ? ?Lab Results  ?Component Value Date  ? NA 141 08/19/2019  ? K 4.6 08/19/2019  ? CL 103 08/19/2019  ? CO2 18 (L) 08/19/2019  ? BUN 17 08/19/2019  ? CREATININE 0.86 08/19/2019  ? GLUCOSE 99 08/19/2019  ? ALT 23 04/07/2019  ? ? ?Lab Results  ?Component Value Date  ? CHOL 124 05/19/2014  ? HDL 38 (L) 05/19/2014  ? [place] 58 05/19/2014  ? TRIG 142 05/19/2014  ? CHOLHDL 3.3 05/19/2014  ? ? ?-------------------------------------------------------------------------------------------------- ? ?ASSESSMENT AND PLAN: ?Chronic HFrEF due to nonischemic cardiomyopathy and shortness of breath: ?Austin Warren is concerned about some exertional dyspnea over the last 3 weeks.   He has mild edema in his legs today, though he is not impressed with the degree of swelling.  His weight is actually down compared with prior visits and has been fairly stable at home.  I recommended that we rep

## 2021-10-19 NOTE — Telephone Encounter (Signed)
I reviewed lab results with Austin Warren as well as prelim echo findings showing interval reduction in LVEF (~25%).  I have recommended increasing torsemide to 20 mg daily and follow-up with me or an APP in the latter half of next week to reassess his symptoms and arrange for [facility] the following week.  Austin Warren and his wife are in agreement with this plan. ? ?Nelva Bush, MD ?[facility] ? ?

## 2021-10-19 NOTE — Patient Instructions (Signed)
Medication Instructions:  ? ?Your physician recommends that you continue on your current medications as directed. Please refer to the Current Medication list given to you today. ? ?*If you need a refill on your cardiac medications before your next appointment, please call your pharmacy* ? ? ?Lab Work: ? ?Today at the medical mall: CBC, CMET, TSH, BNP ? ?If you have labs (blood work) drawn today and your tests are completely normal, you will receive your results only by: ?MyChart Message (if you have MyChart) OR ?A paper copy in the mail ?If you have any lab test that is abnormal or we need to change your treatment, we will call you to review the results. ? ? ?Testing/Procedures: ? ?Your physician has requested that you have an echocardiogram within the next week (ASAP). Echocardiography is a painless test that uses sound waves to create images of your heart. It provides your doctor with information about the size and shape of your heart and how well your heart?s chambers and valves are working. This procedure takes approximately one hour. There are no restrictions for this procedure. ? ? ?Follow-Up: ?At [facility], you and your health needs are our priority.  As part of our continuing mission to provide you with exceptional heart care, we have created designated Provider Care Teams.  These Care Teams include your primary Cardiologist (physician) and Advanced Practice Providers (APPs -  Physician Assistants and Nurse Practitioners) who all work together to provide you with the care you need, when you need it. ? ?We recommend signing up for the patient portal called "MyChart".  Sign up information is provided on this After Visit Summary.  MyChart is used to connect with patients for Virtual Visits (Telemedicine).  Patients are able to view lab/test results, encounter notes, upcoming appointments, etc.  Non-urgent messages can be sent to your provider as well.   ?To learn more about what you can do with MyChart, go  to [email or web address].   ? ?Your next appointment:   ?1 month(s) ? ?The format for your next appointment:   ?In Person ? ?Provider:   ?You may see Dr. Harrell Gave End or one of the following Advanced Practice Providers on your designated Care Team:   ?Murray Hodgkins, NP ?Christell Faith, PA-C ?Cadence Kathlen Mody, PA-C ?

## 2021-10-25 NOTE — Telephone Encounter (Signed)
Pt is scheduled to be seen on Friday 4/14 @ 10:55 am with Austin Warren. Pt's wife states that she will take pt to have labs done either today or tomorrow prior to his appt. Please avise ?

## 2021-10-25 NOTE — Telephone Encounter (Addendum)
Spoke with the patient. ?Pt wife sts that she did not need a call back. ?Pt wife sts that Dr. Saunders Revel told them they would be called to schedule an appt this week with him or an APP but no one called them.  ?Pt wife called this morning and scheduled an appt with RD on 10/27/21. ?Adv the pt wife to have the pt bmp drawn at the [facility] on the day of the appt one hour prior to the appt time. ?Pt wife sts that she would prefer to have the lab done Thurs afternoon. ?Adv her that would be ok. ? ?Apologized to the patients wife about her not receiving a call and thanked her for calling us. ? ? ? ? ?

## 2021-10-25 NOTE — Telephone Encounter (Signed)
Returned the patients wife call. Lmtcb. ?

## 2021-10-26 ENCOUNTER — Encounter: Payer: Self-pay | Admitting: Internal Medicine

## 2021-10-26 ENCOUNTER — Other Ambulatory Visit
Admission: RE | Admit: 2021-10-26 | Discharge: 2021-10-26 | Disposition: A | Discharge status: Home / Self Care | Payer: Medicare Other | Attending: Internal Medicine | Admitting: Internal Medicine

## 2021-10-26 ENCOUNTER — Ambulatory Visit: Payer: Medicare Other | Admitting: Internal Medicine

## 2021-10-26 VITALS — BP 114/76 | HR 83 | Ht 66.0 in | Wt 171.0 lb

## 2021-10-26 DIAGNOSIS — I4821 Permanent atrial fibrillation: Secondary | ICD-10-CM | POA: Diagnosis not present

## 2021-10-26 DIAGNOSIS — I5023 Acute on chronic systolic (congestive) heart failure: Secondary | ICD-10-CM

## 2021-10-26 DIAGNOSIS — I5022 Chronic systolic (congestive) heart failure: Secondary | ICD-10-CM | POA: Insufficient documentation

## 2021-10-26 DIAGNOSIS — Z8679 Personal history of other diseases of the circulatory system: Secondary | ICD-10-CM | POA: Diagnosis not present

## 2021-10-26 DIAGNOSIS — I2510 Atherosclerotic heart disease of native coronary artery without angina pectoris: Secondary | ICD-10-CM | POA: Diagnosis not present

## 2021-10-26 LAB — BASIC METABOLIC PANEL
Anion gap: 7 (ref 5–15)
BUN: 28 mg/dL — ABNORMAL HIGH (ref 8–23)
CO2: 23 mmol/L (ref 22–32)
Calcium: 8.3 mg/dL — ABNORMAL LOW (ref 8.9–10.3)
Chloride: 102 mmol/L (ref 98–111)
Creatinine, Ser: 0.96 mg/dL (ref 0.61–1.24)
GFR, Estimated: >60 mL/min (ref >60)
Glucose, Bld: 145 mg/dL — ABNORMAL HIGH (ref 70–99)
Potassium: 3.6 mmol/L (ref 3.5–5.1)
Sodium: 132 mmol/L — ABNORMAL LOW (ref 135–145)

## 2021-10-26 NOTE — Patient Instructions (Signed)
Medication Instructions:  ? ?Your physician recommends that you continue on your current medications as directed. Please refer to the Current Medication list given to you today. ? ?*If you need a refill on your cardiac medications before your next appointment, please call your pharmacy* ? ? ?Lab Work: ? ?None ordered ? ?Testing/Procedures: ? ?You are scheduled for a Cardiac Catheterization on Monday, April 17 with Dr. Harrell Gave End. ? ?1. Please arrive at the Main Entrance A at [facility]: [place], [place] [address] at 7:00 AM (This time is two hours before your procedure to ensure your preparation). Free valet parking service is available.  ? ?Special note: Every effort is made to have your procedure done on time. Please understand that emergencies sometimes delay scheduled procedures. ? ?2. Diet: Do not eat solid foods after midnight.  You may have clear liquids until 5 AM upon the day of the procedure. ? ?3. Labs: No further labs needed ? ?4. Medication instructions in preparation for your procedure: ? ? ?HOLD Eliquis 2 DAYS PRIOR to procedure: Take LAST dose on Friday 10/27/21. HOLD Saturday and Sunday and continue to hold until AFTER procedure.  ? ?ON Saturday - TAKE Aspirin 81 mg daily while you are holding Eliquis. Make sure that you DO take Aspirin 81 mg on day of procedure.  ? ?On the morning of your procedure, take Aspirin and any morning medicines NOT listed above.  You may use sips of water. ? ?5. Plan to go home the same day, you will only stay overnight if medically necessary. ?6. You MUST have a responsible adult to drive you home. ?7. An adult MUST be with you the first 24 hours after you arrive home. ?8. Bring a current list of your medications, and the last time and date medication taken. ?9. Bring ID and current insurance cards. ?10.Please wear clothes that are easy to get on and off and wear slip-on shoes. ? ?Thank you for allowing [place] to care for you! ?  -- [place]  [facility] ? ? ? ?Follow-Up: ?At [facility], you and your health needs are our priority.  As part of our continuing mission to provide you with exceptional heart care, we have created designated Provider Care Teams.  These Care Teams include your primary Cardiologist (physician) and Advanced Practice Providers (APPs -  Physician Assistants and Nurse Practitioners) who all work together to provide you with the care you need, when you need it. ? ?We recommend signing up for the patient portal called "MyChart".  Sign up information is provided on this After Visit Summary.  MyChart is used to connect with patients for Virtual Visits (Telemedicine).  Patients are able to view lab/test results, encounter notes, upcoming appointments, etc.  Non-urgent messages can be sent to your provider as well.   ?To learn more about what you can do with MyChart, go to [email or web address].   ? ?Your next appointment:   ?3 week(s) ? ?The format for your next appointment:   ?In Person ? ?Provider:   ?You may see Nelva Bush, MD or one of the following Advanced Practice Providers on your designated Care Team:   ?Murray Hodgkins, NP ?Christell Faith, PA-C ?Cadence Kathlen Mody, PA-C ? ?Important Information About Sugar ? ? ? ? ? ? ?

## 2021-10-26 NOTE — Progress Notes (Signed)
? ?Follow-up Outpatient Visit ?Date: 10/26/2021 ? ?Primary Care Provider: ?Kirk Ruths, MD ?[place] [address] [place] ?[place] [address] ? ?Chief Complaint: Follow-up acute on chronic heart failure ? ?HPI:  Austin Warren is a 81 y.o. male with history of nonobstructive coronary artery disease, persistent atrial fibrillation/flutter, recurrent ventricular tachycardia, chronic HFrEF due to nonischemic cardiomyopathy, hypertension, hyperlipidemia, stroke, and hypothyroidism, who presents for follow-up of acute on chronic HFrEF.  I saw him a week ago at which time he complained of more shortness of breath for about 3 weeks.  Echo at that time showed interval decline in LVEF from 40-45% to 20-25%.  He had clinical evidence of volume overload as well.  We agreed to increase torsemide to 20 mg daily. ? ?Today, [name] reports that he feels slightly better than our visit last week though he still has some exertional dyspnea.  He is not aware of much leg swelling.  He notes that his weight is down 3 pounds at home and that he has been urinating a little bit more with escalation of torsemide.  He has not had any chest pain, palpitations, or lightheadedness. ? ?-------------------------------------------------------------------------------------------------- ? ?Past Medical History:  ?Diagnosis Date  ? Arthritis   ? Benign prostatic hypertrophy   ? Bursitis of left shoulder   ? CHF (congestive heart failure) ([place])   ? Coronary artery disease 08/2019  ? Moderate multivessel CAD (not hemodynamically significant by CT-FFR)  ? Diabetes ([place])   ? Dyspnea on exertion 11/01/2011  ? Elevated PSA   ? History of colon polyps   ? History of colon polyps   ? Hyperlipidemia   ? Hypertension   ? Melanoma ([place])   ? under arm  ? Microscopic hematuria   ? Osteoarthritis   ? Retroperitoneal mass   ? Stroke [facility]) 05/2014  ? TIA (transient ischemic attack)   ? Urinary retention   ? UTI (urinary tract infection)    ? Ventricular tachycardia ([place])   ? RBB/LAHB ideopathic VT, 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  ? ?Past Surgical History:  ?Procedure Laterality Date  ? CARDIOVERSION    ? CARDIOVERSION N/A 03/07/2018  ? Procedure: CARDIOVERSION;  Surgeon: Wellington Hampshire, MD;  Location: [facility] ORS;  Service: Cardiovascular;  Laterality: N/A;  ? CARDIOVERSION N/A 04/06/2019  ? Procedure: CARDIOVERSION;  Surgeon: Wellington Hampshire, MD;  Location: [facility] ORS;  Service: Cardiovascular;  Laterality: N/A;  ? CARDIOVERSION N/A 06/02/2019  ? Procedure: CARDIOVERSION;  Surgeon: Nelva Bush, MD;  Location: [facility] ORS;  Service: Cardiovascular;  Laterality: N/A;  ? CARDIOVERSION N/A 07/25/2021  ? Procedure: CARDIOVERSION;  Surgeon: Nelva Bush, MD;  Location: [facility] ORS;  Service: Cardiovascular;  Laterality: N/A;  ? COLONOSCOPY WITH PROPOFOL N/A 10/17/2015  ? Procedure: COLONOSCOPY WITH PROPOFOL;  Surgeon: Manya Silvas, MD;  Location: [facility] ENDOSCOPY;  Service: Endoscopy;  Laterality: N/A;  ? COLONOSCOPY WITH PROPOFOL N/A 03/17/2019  ? Procedure: COLONOSCOPY WITH PROPOFOL;  Surgeon: Toledo, Benay Pike, MD;  Location: [facility] ENDOSCOPY;  Service: Gastroenterology;  Laterality: N/A;  ? ELECTROPHYSIOLOGIC STUDY N/A 12/07/2015  ? Procedure: V Tach Ablation;  Surgeon: Will Helgeson Leeds, MD;  Location: [place] CV LAB;  Service: Cardiovascular;  Laterality: N/A;  ? JOINT REPLACEMENT    ? R TKR  ? RADIOLOGY WITH ANESTHESIA N/A 05/18/2014  ? Procedure: RADIOLOGY WITH ANESTHESIA;  Surgeon: Rob Hickman, MD;  Location: [facility];  Service: Radiology;  Laterality: N/A;  ? REPLACEMENT TOTAL KNEE    ?  REPLACEMENT TOTAL KNEE Left   ? VASECTOMY    ? ? ?Current Meds  ?Medication Sig  ? amiodarone (PACERONE) 200 MG tablet TAKE 1 TABLET BY MOUTH DAILY  ? Ascorbic Acid (VITAMIN C) 500 MG tablet Take 500 mg by mouth daily.    ? atorvastatin (LIPITOR) 10 MG tablet Take 10 mg by mouth daily.  ? carvedilol (COREG) 3.125 MG tablet TAKE 1 TABLET BY MOUTH TWICE DAILY  ?  Cholecalciferol (VITAMIN D3) 1000 UNITS CAPS Take 1,000 Units by mouth daily.   ? docusate sodium (COLACE) 50 MG capsule Take 50 mg by mouth 2 (two) times daily.  ? ELIQUIS 5 MG TABS tablet TAKE ONE TABLET BY MOUTH EVERY 12 HOURS  ? finasteride (PROSCAR) 5 MG tablet TAKE 1 TABLET BY MOUTH DAILY  ? levothyroxine (SYNTHROID) 100 MCG tablet Take 1 tablet (100 mcg total) by mouth daily before breakfast.  ? mexiletine (MEXITIL) 150 MG capsule TAKE 1 CAPSULE BY MOUTH TWICE DAILY  ? Multiple Vitamin (MULTIVITAMIN WITH MINERALS) TABS tablet Take 1 tablet by mouth daily. One A Day 50+  ? spironolactone (ALDACTONE) 25 MG tablet TAKE ONE TABLET BY MOUTH EVERY DAY  ? tamsulosin (FLOMAX) 0.4 MG CAPS capsule TAKE 2 CAPSULES BY MOUTH DAILY  ? torsemide (DEMADEX) 10 MG tablet TAKE ONE TAB ('10MG'$ ) BY MOUTH ONCE DAILY,THOUGH IF HE GAINS 2 POUNDS IN A DAY OR 5 POUNDS IN A WEEK, HE SHOULD RETURN TO '20MG'$  DAILY UNTIL BACK TO BASELINE WEIGHT  ? ? ?Allergies: Ciprofloxacin, Levofloxacin, Enalapril maleate, Metoprolol, and Sulfa antibiotics ? ?Social History  ? ?Tobacco Use  ? Smoking status: Former  ?  Packs/day: 2.00  ?  Years: 10.00  ?  Pack years: 20.00  ?  Types: Cigarettes  ?  Start date: 02/10/1960  ?  Quit date: 02/09/1970  ?  Years since quitting: 51.7  ? Smokeless tobacco: Never  ? Tobacco comments:  ?  05/2014    quit smoking  45 years ago  ?Vaping Use  ? Vaping Use: Never used  ?Substance Use Topics  ? Alcohol use: Yes  ?  Comment: 4-5 beers a day several days per week  ? Drug use: No  ? ? ?Family History  ?Problem Relation Age of Onset  ? Stroke Father   ? Hypertension Father   ? Heart attack Neg Hx   ? Kidney disease Neg Hx   ? Prostate cancer Neg Hx   ? Kidney cancer Neg Hx   ? Bladder Cancer Neg Hx   ? ? ?Review of Systems: ?A 12-system review of systems was performed and was negative except as noted in the HPI. ? ?-------------------------------------------------------------------------------------------------- ? ?Physical  Exam: ?BP 114/76 (BP Location: Left Arm, Patient Position: Sitting, Cuff Size: Normal)   Pulse 83   Ht '5\' 6"'$  (1.676 m)   Wt 171 lb (77.6 kg)   SpO2 96%   BMI 27.60 kg/m?  ? ?General:  NAD.  Accompanied by his wife. ?Neck: JVP 6-8 cm. ?Lungs: Mildly diminished breath sounds at the lung bases.  No wheezes or crackles. ?Heart: Irregularly irregular rhythm without murmurs. ?Abdomen: Soft, nontender, nondistended. ?Extremities: 1+ ankle edema bilaterally. ? ?EKG: Atrial flutter with variable block versus coarse atrial fibrillation and low voltage.  No significant change from prior tracing on 10/19/2021. ? ?Lab Results  ?Component Value Date  ? WBC 8.5 10/19/2021  ? HGB 15.1 10/19/2021  ? HCT 45.0 10/19/2021  ? MCV 98.3 10/19/2021  ? PLT 219 10/19/2021  ? ? ?  Lab Results  ?Component Value Date  ? NA 132 (L) 10/26/2021  ? K 3.6 10/26/2021  ? CL 102 10/26/2021  ? CO2 23 10/26/2021  ? BUN 28 (H) 10/26/2021  ? CREATININE 0.96 10/26/2021  ? GLUCOSE 145 (H) 10/26/2021  ? ALT 22 10/19/2021  ? ? ?Lab Results  ?Component Value Date  ? CHOL 124 05/19/2014  ? HDL 38 (L) 05/19/2014  ? [place] 58 05/19/2014  ? TRIG 142 05/19/2014  ? CHOLHDL 3.3 05/19/2014  ? ? ?-------------------------------------------------------------------------------------------------- ? ?ASSESSMENT AND PLAN: ?Acute on chronic HFrEF: ?Symptomatically, Mr. Trentman feels slightly better than the last week.  He still has some exertional dyspnea consistent with NYHA class II-III heart failure.  He has less edema on exam today, with his weight down 3 pounds since our last visit.  Stat labs today are notable for stable creatinine but slight drop in sodium and bump in BUN.  At this time, I recommend continuation of his current medications including torsemide 20 mg daily.  Given echo demonstrating decline in LVEF from prior study, I have recommended moving forward with right and left heart catheterization and possible PCI.  In anticipation of this, we will have him  stop apixaban 2 days before the catheterization and initiate aspirin 81 mg daily during that interval. ? ?Permanent atrial fibrillation/flutter: ?Ventricular rate adequately controlled.  We will plan to contin

## 2021-10-26 NOTE — H&P (View-Only) (Signed)
? ?Follow-up Outpatient Visit ?Date: 10/26/2021 ? ?Primary Care Provider: ?Kirk Ruths, MD ?[address] ?[place] [address] ? ?Chief Complaint: Follow-up acute on chronic heart failure ? ?HPI:  Austin Warren is a 81 y.o. male with history of nonobstructive coronary artery disease, persistent atrial fibrillation/flutter, recurrent ventricular tachycardia, chronic HFrEF due to nonischemic cardiomyopathy, hypertension, hyperlipidemia, stroke, and hypothyroidism, who presents for follow-up of acute on chronic HFrEF.  I saw him a week ago at which time he complained of more shortness of breath for about 3 weeks.  Echo at that time showed interval decline in LVEF from 40-45% to 20-25%.  He had clinical evidence of volume overload as well.  We agreed to increase torsemide to 20 mg daily. ? ?Today, Austin Warren reports that he feels slightly better than our visit last week though he still has some exertional dyspnea.  He is not aware of much leg swelling.  He notes that his weight is down 3 pounds at home and that he has been urinating a little bit more with escalation of torsemide.  He has not had any chest pain, palpitations, or lightheadedness. ? ?-------------------------------------------------------------------------------------------------- ? ?Past Medical History:  ?Diagnosis Date  ? Arthritis   ? Benign prostatic hypertrophy   ? Bursitis of left shoulder   ? CHF (congestive heart failure) ([place])   ? Coronary artery disease 08/2019  ? Moderate multivessel CAD (not hemodynamically significant by CT-FFR)  ? Diabetes ([place])   ? Dyspnea on exertion 11/01/2011  ? Elevated PSA   ? History of colon polyps   ? History of colon polyps   ? Hyperlipidemia   ? Hypertension   ? Melanoma ([place])   ? under arm  ? Microscopic hematuria   ? Osteoarthritis   ? Retroperitoneal mass   ? Stroke [facility]) 05/2014  ? TIA (transient ischemic attack)   ? Urinary retention   ? UTI (urinary tract infection)    ? Ventricular tachycardia ([place])   ? RBB/LAHB ideopathic VT, 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  ? ?Past Surgical History:  ?Procedure Laterality Date  ? CARDIOVERSION    ? CARDIOVERSION N/A 03/07/2018  ? Procedure: CARDIOVERSION;  Surgeon: Wellington Hampshire, MD;  Location: [facility] ORS;  Service: Cardiovascular;  Laterality: N/A;  ? CARDIOVERSION N/A 04/06/2019  ? Procedure: CARDIOVERSION;  Surgeon: Wellington Hampshire, MD;  Location: [facility] ORS;  Service: Cardiovascular;  Laterality: N/A;  ? CARDIOVERSION N/A 06/02/2019  ? Procedure: CARDIOVERSION;  Surgeon: Nelva Bush, MD;  Location: [facility] ORS;  Service: Cardiovascular;  Laterality: N/A;  ? CARDIOVERSION N/A 07/25/2021  ? Procedure: CARDIOVERSION;  Surgeon: Nelva Bush, MD;  Location: [facility] ORS;  Service: Cardiovascular;  Laterality: N/A;  ? COLONOSCOPY WITH PROPOFOL N/A 10/17/2015  ? Procedure: COLONOSCOPY WITH PROPOFOL;  Surgeon: Manya Silvas, MD;  Location: [facility] ENDOSCOPY;  Service: Endoscopy;  Laterality: N/A;  ? COLONOSCOPY WITH PROPOFOL N/A 03/17/2019  ? Procedure: COLONOSCOPY WITH PROPOFOL;  Surgeon: Toledo, Benay Pike, MD;  Location: [facility] ENDOSCOPY;  Service: Gastroenterology;  Laterality: N/A;  ? ELECTROPHYSIOLOGIC STUDY N/A 12/07/2015  ? Procedure: V Tach Ablation;  Surgeon: Will Huser Leeds, MD;  Location: [place] CV LAB;  Service: Cardiovascular;  Laterality: N/A;  ? JOINT REPLACEMENT    ? R TKR  ? RADIOLOGY WITH ANESTHESIA N/A 05/18/2014  ? Procedure: RADIOLOGY WITH ANESTHESIA;  Surgeon: Rob Hickman, MD;  Location: [facility] [place];  Service: Radiology;  Laterality: N/A;  ? REPLACEMENT TOTAL KNEE    ?  REPLACEMENT TOTAL KNEE Left   ? VASECTOMY    ? ? ?Current Meds  ?Medication Sig  ? amiodarone (PACERONE) 200 MG tablet TAKE 1 TABLET BY MOUTH DAILY  ? Ascorbic Acid (VITAMIN C) 500 MG tablet Take 500 mg by mouth daily.    ? atorvastatin (LIPITOR) 10 MG tablet Take 10 mg by mouth daily.  ? carvedilol (COREG) 3.125 MG tablet TAKE 1 TABLET BY MOUTH TWICE DAILY  ?  Cholecalciferol (VITAMIN D3) 1000 UNITS CAPS Take 1,000 Units by mouth daily.   ? docusate sodium (COLACE) 50 MG capsule Take 50 mg by mouth 2 (two) times daily.  ? ELIQUIS 5 MG TABS tablet TAKE ONE TABLET BY MOUTH EVERY 12 HOURS  ? finasteride (PROSCAR) 5 MG tablet TAKE 1 TABLET BY MOUTH DAILY  ? levothyroxine (SYNTHROID) 100 MCG tablet Take 1 tablet (100 mcg total) by mouth daily before breakfast.  ? mexiletine (MEXITIL) 150 MG capsule TAKE 1 CAPSULE BY MOUTH TWICE DAILY  ? Multiple Vitamin (MULTIVITAMIN WITH MINERALS) TABS tablet Take 1 tablet by mouth daily. One A Day 50+  ? spironolactone (ALDACTONE) 25 MG tablet TAKE ONE TABLET BY MOUTH EVERY DAY  ? tamsulosin (FLOMAX) 0.4 MG CAPS capsule TAKE 2 CAPSULES BY MOUTH DAILY  ? torsemide (DEMADEX) 10 MG tablet TAKE ONE TAB ('10MG'$ ) BY MOUTH ONCE DAILY,THOUGH IF HE GAINS 2 POUNDS IN A DAY OR 5 POUNDS IN A WEEK, HE SHOULD RETURN TO '20MG'$  DAILY UNTIL BACK TO BASELINE WEIGHT  ? ? ?Allergies: Ciprofloxacin, Levofloxacin, Enalapril maleate, Metoprolol, and Sulfa antibiotics ? ?Social History  ? ?Tobacco Use  ? Smoking status: Former  ?  Packs/day: 2.00  ?  Years: 10.00  ?  Pack years: 20.00  ?  Types: Cigarettes  ?  Start date: 02/10/1960  ?  Quit date: 02/09/1970  ?  Years since quitting: 51.7  ? Smokeless tobacco: Never  ? Tobacco comments:  ?  05/2014    quit smoking  45 years ago  ?Vaping Use  ? Vaping Use: Never used  ?Substance Use Topics  ? Alcohol use: Yes  ?  Comment: 4-5 beers a day several days per week  ? Drug use: No  ? ? ?Family History  ?Problem Relation Age of Onset  ? Stroke Father   ? Hypertension Father   ? Heart attack Neg Hx   ? Kidney disease Neg Hx   ? Prostate cancer Neg Hx   ? Kidney cancer Neg Hx   ? Bladder Cancer Neg Hx   ? ? ?Review of Systems: ?A 12-system review of systems was performed and was negative except as noted in the HPI. ? ?-------------------------------------------------------------------------------------------------- ? ?Physical  Exam: ?BP 114/76 (BP Location: Left Arm, Patient Position: Sitting, Cuff Size: Normal)   Pulse 83   Ht '5\' 6"'$  (1.676 m)   Wt 171 lb (77.6 kg)   SpO2 96%   BMI 27.60 kg/m?  ? ?General:  NAD.  Accompanied by his wife. ?Neck: JVP 6-8 cm. ?Lungs: Mildly diminished breath sounds at the lung bases.  No wheezes or crackles. ?Heart: Irregularly irregular rhythm without murmurs. ?Abdomen: Soft, nontender, nondistended. ?Extremities: 1+ ankle edema bilaterally. ? ?EKG: Atrial flutter with variable block versus coarse atrial fibrillation and low voltage.  No significant change from prior tracing on 10/19/2021. ? ?Lab Results  ?Component Value Date  ? WBC 8.5 10/19/2021  ? HGB 15.1 10/19/2021  ? HCT 45.0 10/19/2021  ? MCV 98.3 10/19/2021  ? PLT 219 10/19/2021  ? ? ?  Lab Results  ?Component Value Date  ? NA 132 (L) 10/26/2021  ? K 3.6 10/26/2021  ? CL 102 10/26/2021  ? CO2 23 10/26/2021  ? BUN 28 (H) 10/26/2021  ? CREATININE 0.96 10/26/2021  ? GLUCOSE 145 (H) 10/26/2021  ? ALT 22 10/19/2021  ? ? ?Lab Results  ?Component Value Date  ? CHOL 124 05/19/2014  ? HDL 38 (L) 05/19/2014  ? [place] 58 05/19/2014  ? TRIG 142 05/19/2014  ? CHOLHDL 3.3 05/19/2014  ? ? ?-------------------------------------------------------------------------------------------------- ? ?ASSESSMENT AND PLAN: ?Acute on chronic HFrEF: ?Symptomatically, Austin Warren feels slightly better than the last week.  He still has some exertional dyspnea consistent with NYHA class II-III heart failure.  He has less edema on exam today, with his weight down 3 pounds since our last visit.  Stat labs today are notable for stable creatinine but slight drop in sodium and bump in BUN.  At this time, I recommend continuation of his current medications including torsemide 20 mg daily.  Given echo demonstrating decline in LVEF from prior study, I have recommended moving forward with right and left heart catheterization and possible PCI.  In anticipation of this, we will have him  stop apixaban 2 days before the catheterization and initiate aspirin 81 mg daily during that interval. ? ?Permanent atrial fibrillation/flutter: ?Ventricular rate adequately controlled.  We will plan to contin

## 2021-10-27 ENCOUNTER — Encounter: Payer: Self-pay | Admitting: Internal Medicine

## 2021-10-27 ENCOUNTER — Ambulatory Visit: Payer: Medicare Other | Admitting: Physician Assistant

## 2021-10-30 ENCOUNTER — Other Ambulatory Visit: Payer: Self-pay

## 2021-10-30 ENCOUNTER — Ambulatory Visit (HOSPITAL_COMMUNITY)
Admission: RE | Admit: 2021-10-30 | Discharge: 2021-10-30 | Disposition: A | Discharge status: Home / Self Care | Payer: Medicare Other | Attending: Internal Medicine | Admitting: Internal Medicine

## 2021-10-30 ENCOUNTER — Encounter (HOSPITAL_COMMUNITY)
Admission: RE | Disposition: A | Discharge status: Home / Self Care | Payer: Medicare Other | Source: Home / Self Care | Attending: Internal Medicine

## 2021-10-30 DIAGNOSIS — I428 Other cardiomyopathies: Secondary | ICD-10-CM | POA: Insufficient documentation

## 2021-10-30 DIAGNOSIS — E785 Hyperlipidemia, unspecified: Secondary | ICD-10-CM | POA: Diagnosis not present

## 2021-10-30 DIAGNOSIS — I4821 Permanent atrial fibrillation: Secondary | ICD-10-CM | POA: Diagnosis not present

## 2021-10-30 DIAGNOSIS — I5023 Acute on chronic systolic (congestive) heart failure: Secondary | ICD-10-CM | POA: Diagnosis not present

## 2021-10-30 DIAGNOSIS — I5022 Chronic systolic (congestive) heart failure: Secondary | ICD-10-CM | POA: Diagnosis present

## 2021-10-30 DIAGNOSIS — E039 Hypothyroidism, unspecified: Secondary | ICD-10-CM | POA: Diagnosis not present

## 2021-10-30 DIAGNOSIS — I4892 Unspecified atrial flutter: Secondary | ICD-10-CM | POA: Diagnosis not present

## 2021-10-30 DIAGNOSIS — I110 Hypertensive heart disease with heart failure: Secondary | ICD-10-CM | POA: Insufficient documentation

## 2021-10-30 DIAGNOSIS — Z79899 Other long term (current) drug therapy: Secondary | ICD-10-CM | POA: Insufficient documentation

## 2021-10-30 DIAGNOSIS — Z87891 Personal history of nicotine dependence: Secondary | ICD-10-CM | POA: Insufficient documentation

## 2021-10-30 DIAGNOSIS — Z8673 Personal history of transient ischemic attack (TIA), and cerebral infarction without residual deficits: Secondary | ICD-10-CM | POA: Diagnosis not present

## 2021-10-30 DIAGNOSIS — I2510 Atherosclerotic heart disease of native coronary artery without angina pectoris: Secondary | ICD-10-CM | POA: Diagnosis not present

## 2021-10-30 HISTORY — PX: RIGHT/LEFT HEART CATH AND CORONARY ANGIOGRAPHY: CATH118266

## 2021-10-30 LAB — POCT I-STAT EG7
Acid-Base Excess: 2 mmol/L (ref 0.0–2.0)
Acid-Base Excess: 3 mmol/L — ABNORMAL HIGH (ref 0.0–2.0)
Bicarbonate: 25.1 mmol/L (ref 20.0–28.0)
Bicarbonate: 26.4 mmol/L (ref 20.0–28.0)
Calcium, Ion: 1.13 mmol/L — ABNORMAL LOW (ref 1.15–1.40)
Calcium, Ion: 1.14 mmol/L — ABNORMAL LOW (ref 1.15–1.40)
HCT: 43 % (ref 39.0–52.0)
HCT: 43 % (ref 39.0–52.0)
Hemoglobin: 14.6 g/dL (ref 13.0–17.0)
Hemoglobin: 14.6 g/dL (ref 13.0–17.0)
O2 Saturation: 66 %
O2 Saturation: 97 %
Potassium: 3.3 mmol/L — ABNORMAL LOW (ref 3.5–5.1)
Potassium: 3.3 mmol/L — ABNORMAL LOW (ref 3.5–5.1)
Sodium: 138 mmol/L (ref 135–145)
Sodium: 139 mmol/L (ref 135–145)
TCO2: 26 mmol/L (ref 22–32)
TCO2: 28 mmol/L (ref 22–32)
pCO2, Ven: 31.4 mmHg — ABNORMAL LOW (ref 44–60)
pCO2, Ven: 37.9 mmHg — ABNORMAL LOW (ref 44–60)
pH, Ven: 7.451 — ABNORMAL HIGH (ref 7.25–7.43)
pH, Ven: 7.51 — ABNORMAL HIGH (ref 7.25–7.43)
pO2, Ven: 33 mmHg (ref 32–45)
pO2, Ven: 78 mmHg — ABNORMAL HIGH (ref 32–45)

## 2021-10-30 SURGERY — RIGHT/LEFT HEART CATH AND CORONARY ANGIOGRAPHY
Anesthesia: LOCAL

## 2021-10-30 MED ORDER — HEPARIN SODIUM (PORCINE) 1000 UNIT/ML IJ SOLN
INTRAMUSCULAR | Status: DC | PRN
  Administered 2021-10-30: 4000 [IU] via INTRAVENOUS

## 2021-10-30 MED ORDER — SODIUM CHLORIDE 0.9 % IV SOLN: 250.0000 mL | INTRAVENOUS | Status: DC | PRN

## 2021-10-30 MED ORDER — HEPARIN (PORCINE) IN NACL 1000-0.9 UT/500ML-% IV SOLN
INTRAVENOUS | Status: AC
  Filled 2021-10-30: qty 500

## 2021-10-30 MED ORDER — SODIUM CHLORIDE 0.9 % IV SOLN: INTRAVENOUS | Status: DC

## 2021-10-30 MED ORDER — ONDANSETRON HCL 4 MG/2ML IJ SOLN: 4.0000 mg | Freq: Four times a day (QID) | INTRAMUSCULAR | Status: DC | PRN

## 2021-10-30 MED ORDER — LIDOCAINE HCL (PF) 1 % IJ SOLN
INTRAMUSCULAR | Status: AC
  Filled 2021-10-30: qty 30

## 2021-10-30 MED ORDER — ASPIRIN 81 MG PO CHEW: 81.0000 mg | CHEWABLE_TABLET | ORAL | Status: DC

## 2021-10-30 MED ORDER — HEPARIN (PORCINE) IN NACL 1000-0.9 UT/500ML-% IV SOLN
INTRAVENOUS | Status: DC | PRN
  Administered 2021-10-30 (×2): 500 mL

## 2021-10-30 MED ORDER — MIDAZOLAM HCL 2 MG/2ML IJ SOLN
INTRAMUSCULAR | Status: DC | PRN
  Administered 2021-10-30: .5 mg via INTRAVENOUS

## 2021-10-30 MED ORDER — VERAPAMIL HCL 2.5 MG/ML IV SOLN
INTRAVENOUS | Status: AC
  Filled 2021-10-30: qty 2

## 2021-10-30 MED ORDER — FENTANYL CITRATE (PF) 100 MCG/2ML IJ SOLN
INTRAMUSCULAR | Status: AC
  Filled 2021-10-30: qty 2

## 2021-10-30 MED ORDER — VERAPAMIL HCL 2.5 MG/ML IV SOLN
INTRAVENOUS | Status: DC | PRN
  Administered 2021-10-30: 10 mL via INTRA_ARTERIAL

## 2021-10-30 MED ORDER — TORSEMIDE 10 MG PO TABS: 20.0000 mg | ORAL_TABLET | Freq: Every day | ORAL | Status: DC

## 2021-10-30 MED ORDER — ACETAMINOPHEN 325 MG PO TABS: 650.0000 mg | ORAL_TABLET | ORAL | Status: DC | PRN

## 2021-10-30 MED ORDER — IOHEXOL 350 MG/ML SOLN
INTRAVENOUS | Status: DC | PRN
  Administered 2021-10-30: 35 mL

## 2021-10-30 MED ORDER — FENTANYL CITRATE (PF) 100 MCG/2ML IJ SOLN
INTRAMUSCULAR | Status: DC | PRN
  Administered 2021-10-30: 12.5 ug via INTRAVENOUS

## 2021-10-30 MED ORDER — SODIUM CHLORIDE 0.9% FLUSH: 3.0000 mL | INTRAVENOUS | Status: DC | PRN

## 2021-10-30 MED ORDER — HEPARIN SODIUM (PORCINE) 1000 UNIT/ML IJ SOLN
INTRAMUSCULAR | Status: AC
  Filled 2021-10-30: qty 10

## 2021-10-30 MED ORDER — MIDAZOLAM HCL 2 MG/2ML IJ SOLN
INTRAMUSCULAR | Status: AC
  Filled 2021-10-30: qty 2

## 2021-10-30 MED ORDER — HYDRALAZINE HCL 20 MG/ML IJ SOLN: 10.0000 mg | INTRAMUSCULAR | Status: DC | PRN

## 2021-10-30 MED ORDER — LIDOCAINE HCL (PF) 1 % IJ SOLN
INTRAMUSCULAR | Status: DC | PRN
  Administered 2021-10-30 (×2): 2 mL

## 2021-10-30 MED ORDER — SODIUM CHLORIDE 0.9% FLUSH: 3.0000 mL | Freq: Two times a day (BID) | INTRAVENOUS | Status: DC

## 2021-10-30 MED ORDER — APIXABAN 5 MG PO TABS: 5.0000 mg | ORAL_TABLET | Freq: Two times a day (BID) | ORAL | 1 refills | Status: AC

## 2021-10-30 SURGICAL SUPPLY — 14 items
CATH 5FR JL3.5 JR4 ANG PIG MP (CATHETERS) ×1 IMPLANT
CATH BALLN WEDGE 5F 110CM (CATHETERS) ×1 IMPLANT
DEVICE RAD COMP TR BAND LRG (VASCULAR PRODUCTS) ×1 IMPLANT
ELECT DEFIB PAD ADLT CADENCE (PAD) ×1 IMPLANT
GLIDESHEATH SLEND SS 6F .021 (SHEATH) ×1 IMPLANT
GUIDEWIRE INQWIRE 1.5J.035X260 (WIRE) IMPLANT
INQWIRE 1.5J .035X260CM (WIRE) ×2
KIT HEART LEFT (KITS) ×2 IMPLANT
PACK CARDIAC CATHETERIZATION (CUSTOM PROCEDURE TRAY) ×2 IMPLANT
SHEATH GLIDE SLENDER 4/5FR (SHEATH) ×1 IMPLANT
TRANSDUCER W/STOPCOCK (MISCELLANEOUS) ×2 IMPLANT
TUBING CIL FLEX 10 FLL-RA (TUBING) ×2 IMPLANT
WIRE EMERALD 3MM-J .025X260CM (WIRE) ×1 IMPLANT
WIRE HI TORQ VERSACORE-J 145CM (WIRE) ×1 IMPLANT

## 2021-10-30 NOTE — Interval H&P Note (Signed)
History and Physical Interval Note: ? ?10/30/2021 ?9:59 AM ? ?Austin Warren  has presented today for surgery, with the diagnosis of heart failure with HFrEF.  The various methods of treatment have been discussed with the patient and family. After consideration of risks, benefits and other options for treatment, the patient has consented to  Procedure(s): ?RIGHT/LEFT HEART CATH AND CORONARY ANGIOGRAPHY (N/A) as a surgical intervention.  The patient's history has been reviewed, patient examined, no change in status, stable for surgery.  I have reviewed the patient's chart and labs.  Questions were answered to the patient's satisfaction.   ? ?Cath Lab Visit (complete for each Cath Lab visit) ? ?Clinical Evaluation Leading to the Procedure:  ? ?ACS: No. ? ?Non-ACS:   ? ?Anginal/Heart Failure Classification: NYHA class III ? ?Anti-ischemic medical therapy: Minimal Therapy (1 class of medications) ? ?Non-Invasive Test Results: No non-invasive testing performed (LVEF 20-25% -> high risk) ? ?Prior CABG: No previous CABG ? ?Austin Warren ? ? ?

## 2021-10-30 NOTE — Brief Op Note (Signed)
BRIEF CARDIAC CATHETERIZATION NOTE ? ?DATE: 10/30/2021 ? ?TIME: 10:59 AM ? ?PATIENT:  Austin Warren  81 y.o. male ? ?PRE-OPERATIVE DIAGNOSIS:  Heart failure with reduced EF ? ?POST-OPERATIVE DIAGNOSIS:  Same ? ?PROCEDURE:  Procedure(s): ?RIGHT/LEFT HEART CATH AND CORONARY ANGIOGRAPHY (N/A) ? ?SURGEON:  Surgeon(s) and Role: ?   Nelva Bush, MD - Primary ? ?FINDINGS: ?Mild, non-obstructive CAD.  Findings consistent with NICM. ?Mildly elevated left and right heart filling pressures. ?Mildly reduced [place] cardiac output/index. ? ?RECOMMENDATIONS: ?Continue gentle diuresis. ?Escalate GDMT as tolerated. ? ?Nelva Bush, MD ?[facility] ? ?

## 2021-10-31 ENCOUNTER — Encounter (HOSPITAL_COMMUNITY): Payer: Self-pay | Admitting: Internal Medicine

## 2021-10-31 ENCOUNTER — Other Ambulatory Visit: Payer: Self-pay | Admitting: Internal Medicine

## 2021-11-10 ENCOUNTER — Ambulatory Visit: Payer: Medicare Other | Admitting: Internal Medicine

## 2021-11-20 ENCOUNTER — Other Ambulatory Visit: Payer: Self-pay | Admitting: Internal Medicine

## 2021-11-23 NOTE — Progress Notes (Signed)
? ?Follow-up Outpatient Visit ?Date: 11/24/2021 ? ?Primary Care Provider: ?Kirk Ruths, MD ?[address] ?[place] [address] ? ?Chief Complaint: Shortness of breath ? ?HPI:  Austin Warren is a 81 y.o. male with history of nonobstructive coronary artery disease, persistent atrial fibrillation/flutter, recurrent ventricular tachycardia, chronic HFrEF due to nonischemic cardiomyopathy, hypertension, hyperlipidemia, stroke, and hypothyroidism, who presents for follow-up of HFrEF.  I last saw him a month ago for follow-up of acute on chronic HFrEF.  Preceding echo had shown decline in LVEF to 20-25%.  We agreed to perform right and left heart catheterization, which showed mild, non-obstructive coronary artery disease.  Right and left heart filling pressures were mild elevated; Fick cardiac output/index was mildly reduced. ? ?Today, [name] reports that he continues to feel quite short of breath even with minimal activities such as washing himself or shaving.  He feels like it may have even gotten worse over the last few days, though his wife feels like he is at his baseline.  Symptoms have been present for a few months now and have not improved since his catheterization a month ago.  He does not have orthopnea.  He notes a nonproductive cough over the last few weeks.  He denies chest pain and lightheadedness.  He continues to have mild edema in his legs, right greater than left, following ankle injury.  He has been monitoring his heart rate at home and notes that it is usually in the 80s.  He has been compliant with his medications. ? ?-------------------------------------------------------------------------------------------------- ? ?Past Medical History:  ?Diagnosis Date  ? Arthritis   ? Benign prostatic hypertrophy   ? Bursitis of left shoulder   ? CHF (congestive heart failure) ([place])   ? Coronary artery disease 08/2019  ? Moderate multivessel CAD (not hemodynamically  significant by CT-FFR)  ? Diabetes ([place])   ? Dyspnea on exertion 11/01/2011  ? Elevated PSA   ? History of colon polyps   ? History of colon polyps   ? Hyperlipidemia   ? Hypertension   ? Melanoma ([place])   ? under arm  ? Microscopic hematuria   ? Osteoarthritis   ? Retroperitoneal mass   ? Stroke [facility]) 05/2014  ? TIA (transient ischemic attack)   ? Urinary retention   ? UTI (urinary tract infection)   ? Ventricular tachycardia ([place])   ? RBB/LAHB ideopathic VT, 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  ? ?Past Surgical History:  ?Procedure Laterality Date  ? CARDIOVERSION    ? CARDIOVERSION N/A 03/07/2018  ? Procedure: CARDIOVERSION;  Surgeon: Wellington Hampshire, MD;  Location: [facility] ORS;  Service: Cardiovascular;  Laterality: N/A;  ? CARDIOVERSION N/A 04/06/2019  ? Procedure: CARDIOVERSION;  Surgeon: Wellington Hampshire, MD;  Location: [facility] ORS;  Service: Cardiovascular;  Laterality: N/A;  ? CARDIOVERSION N/A 06/02/2019  ? Procedure: CARDIOVERSION;  Surgeon: Nelva Bush, MD;  Location: [facility] ORS;  Service: Cardiovascular;  Laterality: N/A;  ? CARDIOVERSION N/A 07/25/2021  ? Procedure: CARDIOVERSION;  Surgeon: Nelva Bush, MD;  Location: [facility] ORS;  Service: Cardiovascular;  Laterality: N/A;  ? COLONOSCOPY WITH PROPOFOL N/A 10/17/2015  ? Procedure: COLONOSCOPY WITH PROPOFOL;  Surgeon: Manya Silvas, MD;  Location: [facility] ENDOSCOPY;  Service: Endoscopy;  Laterality: N/A;  ? COLONOSCOPY WITH PROPOFOL N/A 03/17/2019  ? Procedure: COLONOSCOPY WITH PROPOFOL;  Surgeon: Toledo, Benay Pike, MD;  Location: [facility] ENDOSCOPY;  Service: Gastroenterology;  Laterality: N/A;  ? ELECTROPHYSIOLOGIC STUDY N/A 12/07/2015  ? Procedure: V Tach Ablation;  Surgeon: Will Fomby Leeds, MD;  Location: [facility] CV LAB;  Service: Cardiovascular;  Laterality: N/A;  ? JOINT REPLACEMENT    ? R TKR  ? RADIOLOGY WITH ANESTHESIA N/A 05/18/2014  ? Procedure: RADIOLOGY WITH ANESTHESIA;  Surgeon: Rob Hickman, MD;  Location: [facility];  Service: Radiology;   Laterality: N/A;  ? REPLACEMENT TOTAL KNEE    ? REPLACEMENT TOTAL KNEE Left   ? RIGHT/LEFT HEART CATH AND CORONARY ANGIOGRAPHY N/A 10/30/2021  ? Procedure: RIGHT/LEFT HEART CATH AND CORONARY ANGIOGRAPHY;  Surgeon: Nelva Bush, MD;  Location: [facility] CV LAB;  Service: Cardiovascular;  Laterality: N/A;  ? VASECTOMY    ? ? ?Current Meds  ?Medication Sig  ? amiodarone (PACERONE) 200 MG tablet TAKE 1 TABLET BY MOUTH DAILY  ? apixaban (ELIQUIS) 5 MG TABS tablet Take 1 tablet (5 mg total) by mouth every 12 (twelve) hours.  ? Ascorbic Acid (VITAMIN C) 500 MG tablet Take 500 mg by mouth daily.    ? atorvastatin (LIPITOR) 10 MG tablet Take 10 mg by mouth daily.  ? carvedilol (COREG) 3.125 MG tablet TAKE 1 TABLET BY MOUTH TWICE DAILY  ? Cholecalciferol (VITAMIN D3) 1000 UNITS CAPS Take 1,000 Units by mouth daily.   ? docusate sodium (COLACE) 50 MG capsule Take 50 mg by mouth 2 (two) times daily.  ? finasteride (PROSCAR) 5 MG tablet TAKE 1 TABLET BY MOUTH DAILY  ? levothyroxine (SYNTHROID) 100 MCG tablet Take 1 tablet (100 mcg total) by mouth daily before breakfast.  ? mexiletine (MEXITIL) 150 MG capsule TAKE 1 CAPSULE BY MOUTH TWICE DAILY  ? Multiple Vitamin (MULTIVITAMIN WITH MINERALS) TABS tablet Take 1 tablet by mouth daily. One A Day 50+  ? spironolactone (ALDACTONE) 25 MG tablet TAKE ONE TABLET BY MOUTH EVERY DAY  ? tamsulosin (FLOMAX) 0.4 MG CAPS capsule TAKE 2 CAPSULES BY MOUTH DAILY  ? torsemide (DEMADEX) 10 MG tablet Take 20 mg by mouth daily.  ? ? ?Allergies: Ciprofloxacin, Levofloxacin, Enalapril maleate, Metoprolol, and Sulfa antibiotics ? ?Social History  ? ?Tobacco Use  ? Smoking status: Former  ?  Packs/day: 2.00  ?  Years: 10.00  ?  Pack years: 20.00  ?  Types: Cigarettes  ?  Start date: 02/10/1960  ?  Quit date: 02/09/1970  ?  Years since quitting: 51.8  ? Smokeless tobacco: Never  ? Tobacco comments:  ?  05/2014    quit smoking  45 years ago  ?Vaping Use  ? Vaping Use: Never used  ?Substance Use Topics   ? Alcohol use: Yes  ?  Comment: 2 beers a few days per week  ? Drug use: No  ? ? ?Family History  ?Problem Relation Age of Onset  ? Stroke Father   ? Hypertension Father   ? Heart attack Neg Hx   ? Kidney disease Neg Hx   ? Prostate cancer Neg Hx   ? Kidney cancer Neg Hx   ? Bladder Cancer Neg Hx   ? ? ?Review of Systems: ?A 12-system review of systems was performed and was negative except as noted in the HPI. ? ?-------------------------------------------------------------------------------------------------- ? ?Physical Exam: ?BP 110/70 (BP Location: Left Arm, Patient Position: Sitting, Cuff Size: Normal)   Pulse 98   Ht '5\' 3"'$  (1.6 m)   Wt 170 lb (77.1 kg)   SpO2 96%   BMI 30.11 kg/m?  ? ?General:  NAD. ?Neck: No JVD or HJR. ?Lungs: Tachypnea noted.  Coarse breath sounds bilaterally without wheezes or crackles. ?Heart:  Irregularly irregular rhythm with 1/6 systolic murmur. ?Abdomen: Soft, nontender, nondistended. ?Extremities: 1+ right and trace left ankle edema. ? ?EKG: Atrial flutter with variable conduction, low voltage, and poor R wave progression.  Ventricular rate is slightly higher compared to 10/26/2021.  Otherwise, no significant interval change. ? ?Lab Results  ?Component Value Date  ? WBC 8.5 10/19/2021  ? HGB 14.6 10/30/2021  ? HCT 43.0 10/30/2021  ? MCV 98.3 10/19/2021  ? PLT 219 10/19/2021  ? ? ?Lab Results  ?Component Value Date  ? NA 138 10/30/2021  ? K 3.3 (L) 10/30/2021  ? CL 102 10/26/2021  ? CO2 23 10/26/2021  ? BUN 28 (H) 10/26/2021  ? CREATININE 0.96 10/26/2021  ? GLUCOSE 145 (H) 10/26/2021  ? ALT 22 10/19/2021  ? ? ?Lab Results  ?Component Value Date  ? CHOL 124 05/19/2014  ? HDL 38 (L) 05/19/2014  ? [place] 58 05/19/2014  ? TRIG 142 05/19/2014  ? CHOLHDL 3.3 05/19/2014  ? ? ?-------------------------------------------------------------------------------------------------- ? ?ASSESSMENT AND PLAN: ?Chronic HFrEF due to nonischemic cardiomyopathy and shortness of breath: ?Mr. Mitchner's  volume status appears stable with only mild edema on exam today.  He does not have elevated JVP or crackles on the lung exam.  His weight is also stable from his last over the last month and has come down since Februa

## 2021-11-24 ENCOUNTER — Other Ambulatory Visit: Payer: Self-pay

## 2021-11-24 ENCOUNTER — Telehealth: Payer: Self-pay | Admitting: Internal Medicine

## 2021-11-24 ENCOUNTER — Inpatient Hospital Stay
Admission: EM | Admit: 2021-11-24 | Discharge: 2021-12-01 | DRG: 291 | Disposition: A | Discharge status: Home / Self Care | Payer: Medicare Other | Attending: Internal Medicine | Admitting: Internal Medicine

## 2021-11-24 ENCOUNTER — Ambulatory Visit
Admission: RE | Admit: 2021-11-24 | Discharge: 2021-11-24 | Disposition: A | Discharge status: Home / Self Care | Payer: Medicare Other | Source: Home / Self Care | Attending: Internal Medicine | Admitting: Internal Medicine

## 2021-11-24 ENCOUNTER — Ambulatory Visit
Admission: RE | Admit: 2021-11-24 | Discharge: 2021-11-24 | Disposition: A | Discharge status: Home / Self Care | Payer: Medicare Other | Source: Ambulatory Visit | Attending: Internal Medicine | Admitting: Internal Medicine

## 2021-11-24 ENCOUNTER — Other Ambulatory Visit
Admission: RE | Admit: 2021-11-24 | Discharge: 2021-11-24 | Disposition: A | Discharge status: Home / Self Care | Payer: Medicare Other | Source: Home / Self Care | Attending: Internal Medicine | Admitting: Internal Medicine

## 2021-11-24 ENCOUNTER — Emergency Department: Payer: Medicare Other

## 2021-11-24 ENCOUNTER — Encounter: Payer: Self-pay | Admitting: Internal Medicine

## 2021-11-24 ENCOUNTER — Ambulatory Visit: Payer: Medicare Other | Admitting: Internal Medicine

## 2021-11-24 VITALS — BP 110/70 | HR 98 | Ht 63.0 in | Wt 170.0 lb

## 2021-11-24 DIAGNOSIS — F419 Anxiety disorder, unspecified: Secondary | ICD-10-CM | POA: Diagnosis not present

## 2021-11-24 DIAGNOSIS — Z20822 Contact with and (suspected) exposure to covid-19: Secondary | ICD-10-CM | POA: Diagnosis present

## 2021-11-24 DIAGNOSIS — E871 Hypo-osmolality and hyponatremia: Secondary | ICD-10-CM | POA: Diagnosis not present

## 2021-11-24 DIAGNOSIS — M1990 Unspecified osteoarthritis, unspecified site: Secondary | ICD-10-CM | POA: Diagnosis present

## 2021-11-24 DIAGNOSIS — E039 Hypothyroidism, unspecified: Secondary | ICD-10-CM | POA: Diagnosis present

## 2021-11-24 DIAGNOSIS — I4821 Permanent atrial fibrillation: Secondary | ICD-10-CM

## 2021-11-24 DIAGNOSIS — I428 Other cardiomyopathies: Secondary | ICD-10-CM | POA: Diagnosis not present

## 2021-11-24 DIAGNOSIS — Z881 Allergy status to other antibiotic agents status: Secondary | ICD-10-CM

## 2021-11-24 DIAGNOSIS — Z8249 Family history of ischemic heart disease and other diseases of the circulatory system: Secondary | ICD-10-CM

## 2021-11-24 DIAGNOSIS — E1159 Type 2 diabetes mellitus with other circulatory complications: Secondary | ICD-10-CM | POA: Diagnosis present

## 2021-11-24 DIAGNOSIS — I2510 Atherosclerotic heart disease of native coronary artery without angina pectoris: Secondary | ICD-10-CM | POA: Diagnosis present

## 2021-11-24 DIAGNOSIS — I5022 Chronic systolic (congestive) heart failure: Secondary | ICD-10-CM

## 2021-11-24 DIAGNOSIS — I110 Hypertensive heart disease with heart failure: Secondary | ICD-10-CM | POA: Diagnosis present

## 2021-11-24 DIAGNOSIS — I255 Ischemic cardiomyopathy: Secondary | ICD-10-CM | POA: Diagnosis present

## 2021-11-24 DIAGNOSIS — I420 Dilated cardiomyopathy: Secondary | ICD-10-CM | POA: Diagnosis present

## 2021-11-24 DIAGNOSIS — I340 Nonrheumatic mitral (valve) insufficiency: Secondary | ICD-10-CM | POA: Diagnosis present

## 2021-11-24 DIAGNOSIS — Z823 Family history of stroke: Secondary | ICD-10-CM

## 2021-11-24 DIAGNOSIS — Z66 Do not resuscitate: Secondary | ICD-10-CM | POA: Diagnosis not present

## 2021-11-24 DIAGNOSIS — I272 Pulmonary hypertension, unspecified: Secondary | ICD-10-CM | POA: Diagnosis present

## 2021-11-24 DIAGNOSIS — R0602 Shortness of breath: Secondary | ICD-10-CM | POA: Insufficient documentation

## 2021-11-24 DIAGNOSIS — Z7901 Long term (current) use of anticoagulants: Secondary | ICD-10-CM

## 2021-11-24 DIAGNOSIS — K761 Chronic passive congestion of liver: Secondary | ICD-10-CM | POA: Diagnosis present

## 2021-11-24 DIAGNOSIS — E785 Hyperlipidemia, unspecified: Secondary | ICD-10-CM | POA: Diagnosis present

## 2021-11-24 DIAGNOSIS — I4892 Unspecified atrial flutter: Secondary | ICD-10-CM | POA: Diagnosis present

## 2021-11-24 DIAGNOSIS — I69320 Aphasia following cerebral infarction: Secondary | ICD-10-CM | POA: Diagnosis not present

## 2021-11-24 DIAGNOSIS — Z79890 Hormone replacement therapy (postmenopausal): Secondary | ICD-10-CM

## 2021-11-24 DIAGNOSIS — J9602 Acute respiratory failure with hypercapnia: Secondary | ICD-10-CM | POA: Diagnosis present

## 2021-11-24 DIAGNOSIS — I5023 Acute on chronic systolic (congestive) heart failure: Secondary | ICD-10-CM | POA: Diagnosis not present

## 2021-11-24 DIAGNOSIS — Z79899 Other long term (current) drug therapy: Secondary | ICD-10-CM

## 2021-11-24 DIAGNOSIS — Z8673 Personal history of transient ischemic attack (TIA), and cerebral infarction without residual deficits: Secondary | ICD-10-CM

## 2021-11-24 DIAGNOSIS — J9601 Acute respiratory failure with hypoxia: Secondary | ICD-10-CM | POA: Diagnosis present

## 2021-11-24 DIAGNOSIS — Z7189 Other specified counseling: Secondary | ICD-10-CM | POA: Diagnosis not present

## 2021-11-24 DIAGNOSIS — Z8679 Personal history of other diseases of the circulatory system: Secondary | ICD-10-CM

## 2021-11-24 DIAGNOSIS — R570 Cardiogenic shock: Secondary | ICD-10-CM | POA: Diagnosis present

## 2021-11-24 DIAGNOSIS — E876 Hypokalemia: Secondary | ICD-10-CM | POA: Diagnosis not present

## 2021-11-24 DIAGNOSIS — I5043 Acute on chronic combined systolic (congestive) and diastolic (congestive) heart failure: Secondary | ICD-10-CM | POA: Diagnosis present

## 2021-11-24 DIAGNOSIS — Z85820 Personal history of malignant melanoma of skin: Secondary | ICD-10-CM

## 2021-11-24 DIAGNOSIS — J189 Pneumonia, unspecified organism: Secondary | ICD-10-CM | POA: Diagnosis not present

## 2021-11-24 DIAGNOSIS — I959 Hypotension, unspecified: Secondary | ICD-10-CM

## 2021-11-24 DIAGNOSIS — Z888 Allergy status to other drugs, medicaments and biological substances status: Secondary | ICD-10-CM

## 2021-11-24 DIAGNOSIS — E872 Acidosis, unspecified: Secondary | ICD-10-CM | POA: Diagnosis present

## 2021-11-24 DIAGNOSIS — Z96653 Presence of artificial knee joint, bilateral: Secondary | ICD-10-CM | POA: Diagnosis present

## 2021-11-24 DIAGNOSIS — I4891 Unspecified atrial fibrillation: Secondary | ICD-10-CM | POA: Diagnosis not present

## 2021-11-24 DIAGNOSIS — I712 Thoracic aortic aneurysm, without rupture, unspecified: Secondary | ICD-10-CM | POA: Diagnosis present

## 2021-11-24 DIAGNOSIS — Z87891 Personal history of nicotine dependence: Secondary | ICD-10-CM

## 2021-11-24 DIAGNOSIS — Z882 Allergy status to sulfonamides status: Secondary | ICD-10-CM

## 2021-11-24 LAB — BASIC METABOLIC PANEL
Anion gap: 10 (ref 5–15)
BUN: 24 mg/dL — ABNORMAL HIGH (ref 8–23)
CO2: 25 mmol/L (ref 22–32)
Calcium: 8.7 mg/dL — ABNORMAL LOW (ref 8.9–10.3)
Chloride: 96 mmol/L — ABNORMAL LOW (ref 98–111)
Creatinine, Ser: 0.94 mg/dL (ref 0.61–1.24)
GFR, Estimated: >60 mL/min (ref >60)
Glucose, Bld: 106 mg/dL — ABNORMAL HIGH (ref 70–99)
Potassium: 3.6 mmol/L (ref 3.5–5.1)
Sodium: 131 mmol/L — ABNORMAL LOW (ref 135–145)

## 2021-11-24 LAB — COMPREHENSIVE METABOLIC PANEL
ALT: 19 U/L (ref 0–44)
AST: 27 U/L (ref 15–41)
Albumin: 2.9 g/dL — ABNORMAL LOW (ref 3.5–5.0)
Alkaline Phosphatase: 165 U/L — ABNORMAL HIGH (ref 38–126)
Anion gap: 8 (ref 5–15)
BUN: 24 mg/dL — ABNORMAL HIGH (ref 8–23)
CO2: 26 mmol/L (ref 22–32)
Calcium: 8.6 mg/dL — ABNORMAL LOW (ref 8.9–10.3)
Chloride: 99 mmol/L (ref 98–111)
Creatinine, Ser: 0.9 mg/dL (ref 0.61–1.24)
GFR, Estimated: >60 mL/min (ref >60)
Glucose, Bld: 138 mg/dL — ABNORMAL HIGH (ref 70–99)
Potassium: 3.5 mmol/L (ref 3.5–5.1)
Sodium: 133 mmol/L — ABNORMAL LOW (ref 135–145)
Total Bilirubin: 1.5 mg/dL — ABNORMAL HIGH (ref 0.3–1.2)
Total Protein: 7.4 g/dL (ref 6.5–8.1)

## 2021-11-24 LAB — CBC
HCT: 43.7 % (ref 39.0–52.0)
Hemoglobin: 14.4 g/dL (ref 13.0–17.0)
MCH: 31.4 pg (ref 26.0–34.0)
MCHC: 33 g/dL (ref 30.0–36.0)
MCV: 95.2 fL (ref 80.0–100.0)
Platelets: 245 10*3/uL (ref 150–400)
RBC: 4.59 MIL/uL (ref 4.22–5.81)
RDW: 14 % (ref 11.5–15.5)
WBC: 9.3 10*3/uL (ref 4.0–10.5)
nRBC: 0 % (ref 0.0–0.2)

## 2021-11-24 LAB — CBC WITH DIFFERENTIAL/PLATELET
Abs Immature Granulocytes: 0.1 10*3/uL — ABNORMAL HIGH (ref 0.00–0.07)
Basophils Absolute: 0.1 10*3/uL (ref 0.0–0.1)
Basophils Relative: 1 %
Eosinophils Absolute: 0.2 10*3/uL (ref 0.0–0.5)
Eosinophils Relative: 2 %
HCT: 47.5 % (ref 39.0–52.0)
Hemoglobin: 15.8 g/dL (ref 13.0–17.0)
Immature Granulocytes: 1 %
Lymphocytes Relative: 20 %
Lymphs Abs: 2.1 10*3/uL (ref 0.7–4.0)
MCH: 31.7 pg (ref 26.0–34.0)
MCHC: 33.3 g/dL (ref 30.0–36.0)
MCV: 95.4 fL (ref 80.0–100.0)
Monocytes Absolute: 1 10*3/uL (ref 0.1–1.0)
Monocytes Relative: 9 %
Neutro Abs: 7.2 10*3/uL (ref 1.7–7.7)
Neutrophils Relative %: 67 %
Platelets: 289 10*3/uL (ref 150–400)
RBC: 4.98 MIL/uL (ref 4.22–5.81)
RDW: 14 % (ref 11.5–15.5)
WBC: 10.7 10*3/uL — ABNORMAL HIGH (ref 4.0–10.5)
nRBC: 0 % (ref 0.0–0.2)

## 2021-11-24 LAB — PROCALCITONIN: Procalcitonin: <0.1 ng/mL

## 2021-11-24 LAB — BRAIN NATRIURETIC PEPTIDE: B Natriuretic Peptide: 742.7 pg/mL — ABNORMAL HIGH (ref 0.0–100.0)

## 2021-11-24 MED ORDER — SODIUM CHLORIDE 0.9 % IV SOLN
500.0000 mg | INTRAVENOUS | Status: DC
  Filled 2021-11-24: qty 5

## 2021-11-24 MED ORDER — ACETAMINOPHEN 650 MG RE SUPP
650.0000 mg | Freq: Four times a day (QID) | RECTAL | Status: DC | PRN
Start: 2021-11-24 — End: 2021-12-01

## 2021-11-24 MED ORDER — APIXABAN 5 MG PO TABS
5.0000 mg | ORAL_TABLET | Freq: Two times a day (BID) | ORAL | Status: DC
  Administered 2021-11-25 (×2): 5 mg via ORAL
  Filled 2021-11-24 (×2): qty 1

## 2021-11-24 MED ORDER — IOHEXOL 350 MG/ML SOLN
75.0000 mL | Freq: Once | INTRAVENOUS | Status: AC | PRN
  Administered 2021-11-24: 75 mL via INTRAVENOUS

## 2021-11-24 MED ORDER — AMIODARONE HCL 200 MG PO TABS
200.0000 mg | ORAL_TABLET | Freq: Every day | ORAL | Status: DC
  Administered 2021-11-25: 200 mg via ORAL
  Filled 2021-11-24: qty 1

## 2021-11-24 MED ORDER — MEXILETINE HCL 150 MG PO CAPS
150.0000 mg | ORAL_CAPSULE | Freq: Two times a day (BID) | ORAL | Status: DC
  Administered 2021-11-25 – 2021-12-01 (×13): 150 mg via ORAL
  Filled 2021-11-24 (×14): qty 1

## 2021-11-24 MED ORDER — SODIUM CHLORIDE 0.9 % IV SOLN
2.0000 g | Freq: Once | INTRAVENOUS | Status: AC
  Administered 2021-11-24: 2 g via INTRAVENOUS
  Filled 2021-11-24: qty 20

## 2021-11-24 MED ORDER — ACETAMINOPHEN 325 MG PO TABS: 650.0000 mg | ORAL_TABLET | Freq: Four times a day (QID) | ORAL | Status: DC | PRN

## 2021-11-24 MED ORDER — SODIUM CHLORIDE 0.9 % IV SOLN
500.0000 mg | Freq: Once | INTRAVENOUS | Status: AC
  Administered 2021-11-24: 500 mg via INTRAVENOUS
  Filled 2021-11-24: qty 5

## 2021-11-24 MED ORDER — ATORVASTATIN CALCIUM 10 MG PO TABS
10.0000 mg | ORAL_TABLET | Freq: Every day | ORAL | Status: DC
  Administered 2021-11-25 – 2021-12-01 (×7): 10 mg via ORAL
  Filled 2021-11-24 (×7): qty 1

## 2021-11-24 MED ORDER — SPIRONOLACTONE 25 MG PO TABS
25.0000 mg | ORAL_TABLET | Freq: Every day | ORAL | Status: DC
Start: 2021-11-25 — End: 2021-11-25
  Administered 2021-11-25: 25 mg via ORAL
  Filled 2021-11-24: qty 1

## 2021-11-24 MED ORDER — CARVEDILOL 3.125 MG PO TABS
3.1250 mg | ORAL_TABLET | Freq: Two times a day (BID) | ORAL | Status: DC
Start: 2021-11-24 — End: 2021-11-25
  Administered 2021-11-25 (×2): 3.125 mg via ORAL
  Filled 2021-11-24 (×2): qty 1

## 2021-11-24 MED ORDER — LEVOTHYROXINE SODIUM 100 MCG PO TABS
100.0000 ug | ORAL_TABLET | Freq: Every day | ORAL | Status: DC
  Administered 2021-11-25 – 2021-12-01 (×7): 100 ug via ORAL
  Filled 2021-11-24 (×4): qty 1
  Filled 2021-11-24: qty 2
  Filled 2021-11-24 (×2): qty 1

## 2021-11-24 MED ORDER — ONDANSETRON HCL 4 MG/2ML IJ SOLN: 4.0000 mg | Freq: Four times a day (QID) | INTRAMUSCULAR | Status: DC | PRN

## 2021-11-24 MED ORDER — ONDANSETRON HCL 4 MG PO TABS: 4.0000 mg | ORAL_TABLET | Freq: Four times a day (QID) | ORAL | Status: DC | PRN

## 2021-11-24 MED ORDER — SODIUM CHLORIDE 0.9 % IV SOLN
2.0000 g | INTRAVENOUS | Status: AC
  Administered 2021-11-25 – 2021-11-28 (×4): 2 g via INTRAVENOUS
  Filled 2021-11-24 (×4): qty 2
  Filled 2021-11-24: qty 20

## 2021-11-24 NOTE — Assessment & Plan Note (Addendum)
Continue amiodarone, Coreg for rate control and apixaban for anticoagulation ? ?Nurse notified for his resting heart rate ranging from 120-140.  We will request cardiology to evaluate him for further recommendation ?

## 2021-11-24 NOTE — Assessment & Plan Note (Addendum)
Ruled out for PE with CTA chest ?Suspect multifactorial and related mostly to multifactorial pneumonia but also in part chronic systolic heart failure.  Patient is on amiodarone fluid was concern for pulmonary fibrosis ?Treat pneumonia as outlined below ?Supplemental oxygen to keep sats over 92%-his oxygen requirement has gone up from 2 L to 4 L now ? ?He is getting more tachypneic, hypoxic and tachycardic now.  I will change him from progressive care to stepdown level of care ?I have reached out to cardiology Dr. Quentin Ore and ICU team to evaluate him as I am concerned for his clinical decompensation ?

## 2021-11-24 NOTE — Assessment & Plan Note (Addendum)
Does not appear to be acutely exacerbated.  Patient was evaluated by his cardiologist yesterday in the office.  Findings on chest x-ray favoring multifocal pneumonia rather than edema ?Continue carvedilol and spironolactone as blood pressure will tolerate ?Daily weights with intake and output monitoring ?Net IO Since Admission: 350.37 mL [11/25/21 1501]  ? ? ?

## 2021-11-24 NOTE — ED Triage Notes (Addendum)
Pt presents to ED per his cardiologist request for SOB for over a month. Hx of A-fib and CHF.  Dr. Saunders Revel called and informed this nurse pt had labs and a chest xray performed in his office earlier today.requested possible chest CT and possible admission. Pt is said to cot have worsening symptoms currently and is just not improving at this time.   ?

## 2021-11-24 NOTE — Assessment & Plan Note (Signed)
Continue levothyroxine 

## 2021-11-24 NOTE — ED Notes (Signed)
Pt placed on oxygen at 2lpm via [place] with rebound pulse ox up to 90-92%  ?

## 2021-11-24 NOTE — Assessment & Plan Note (Signed)
Had recent right and left heart catheterization, which showed mild, non-obstructive coronary artery disease. Right and left heart filling pressures were mild elevated  ? ?

## 2021-11-24 NOTE — Assessment & Plan Note (Addendum)
Continue sliding scale insulin coverage 

## 2021-11-24 NOTE — Patient Instructions (Signed)
Medication Instructions:  ? ?Your physician recommends that you continue on your current medications as directed. Please refer to the Current Medication list given to you today.  ? ?*If you need a refill on your cardiac medications before your next appointment, please call your pharmacy* ? ? ?Lab Work: ? ?Today: CBC, CMP, BNP. Please go to the [facility] to have these drawn.  ? ?If you have labs (blood work) drawn today and your tests are completely normal, you will receive your results only by: ?MyChart Message (if you have MyChart) OR ?A paper copy in the mail ?If you have any lab test that is abnormal or we need to change your treatment, we will call you to review the results. ? ? ?Testing/Procedures: ? ?Your provider has ordered a chest x-ray. A chest x-ray takes a picture of the organs and structures inside the chest, including the heart, lungs, and blood vessels. This test can show several things, including, whether the heart is enlarges; whether fluid is building up in the lungs; and whether pacemaker / defibrillator leads are still in place.  ? ?Please go to the  to have this completed.  ? ?You have been referred to the [facility] in [place]. They will call you to set up an appointment.  ? ?Follow-Up: ?At [facility], you and your health needs are our priority.  As part of our continuing mission to provide you with exceptional heart care, we have created designated Provider Care Teams.  These Care Teams include your primary Cardiologist (physician) and Advanced Practice Providers (APPs -  Physician Assistants and Nurse Practitioners) who all work together to provide you with the care you need, when you need it. ? ?We recommend signing up for the patient portal called "MyChart".  Sign up information is provided on this After Visit Summary.  MyChart is used to connect with patients for Virtual Visits (Telemedicine).  Patients are able to view lab/test results, encounter  notes, upcoming appointments, etc.  Non-urgent messages can be sent to your provider as well.   ?To learn more about what you can do with MyChart, go to [email or web address].   ? ?Your next appointment:   ?1-2 week(s) ? ?The format for your next appointment:   ?In Person ? ?Provider:   ?Virl Axe, MD ? ?Follow up with Dr. Saunders Revel in 6 weeks.   ? ? ?Other Instructions ?N/A ? ?Important Information About Sugar ? ? ? ? ?  ?

## 2021-11-24 NOTE — ED Triage Notes (Signed)
Pt states 4 days of shortness of breath. Pt states was seen at cardiologist today for same and told to come to ed due to results of lab work. Pt states is also having central chest pain for 4 days that is intermittent.  ?

## 2021-11-24 NOTE — Assessment & Plan Note (Addendum)
Continue apixaban and atorvastatin 

## 2021-11-24 NOTE — Assessment & Plan Note (Signed)
Sliding-scale insulin 

## 2021-11-24 NOTE — Assessment & Plan Note (Addendum)
Continue Rocephin and azithromycin ?Antitussives, DuoNebs as needed and supplemental oxygen ?Pulmonary consult ?

## 2021-11-24 NOTE — ED Provider Notes (Signed)
? ?[facility] ?Provider Note ? ? ? Event Date/Time  ? First MD Initiated Contact with Patient 11/24/21 2227   ?  (approximate) ? ? ?History  ? ?Shortness of Breath ? ? ?HPI ? ?Austin Warren is a 81 y.o. male with a history of diabetes atrial fibrillation heart failure who comes the ED complaining of worsening shortness of breath for the past 4 days.  Constant, worse with ambulation.  No cough.  No chest pain.  He was seen in cardiology clinic earlier today where labs were overall unimpressive, chest x-ray unrevealing, and he was sent to the ED for further evaluation and PE rule out specifically. ?  ? ? ?Physical Exam  ? ?Triage Vital Signs: ?ED Triage Vitals  ?Enc Vitals Group  ?   BP 11/24/21 1905 (!) 135/110  ?   Pulse Rate 11/24/21 1905 (!) 133  ?   Resp 11/24/21 1905 20  ?   Temp 11/24/21 1905 98 ?F (36.7 ?C)  ?   Temp src --   ?   SpO2 11/24/21 1905 (!) 86 %  ?   Weight 11/24/21 1907 165 lb (74.8 kg)  ?   Height 11/24/21 1907 '5\' 6"'$  (1.676 m)  ?   Head Circumference --   ?   Peak Flow --   ?   Pain Score 11/24/21 1907 8  ?   Pain Loc --   ?   Pain Edu? --   ?   Excl. in [place]? --   ? ? ?Most recent vital signs: ?Vitals:  ? 11/24/21 2200 11/24/21 2230  ?BP: 113/75 107/69  ?Pulse: 81 62  ?Resp: (!) 27 (!) 32  ?Temp:    ?SpO2: 99% 99%  ? ? ? ?General: Awake, no distress.  ?CV:  Good peripheral perfusion.  Regular rate and rhythm ?Resp:  Normal effort.  Lungs clear to auscultation bilaterally, no wheezing ?Abd:  No distention.  Soft and nontender ?Other:  Minimal bilateral peripheral edema.  No calf tenderness. ? ? ?ED Results / Procedures / Treatments  ? ?Labs ?(all labs ordered are listed, but only abnormal results are displayed) ?Labs Reviewed  ?CBC WITH DIFFERENTIAL/PLATELET - Abnormal; Notable for the following components:  ?    Result Value  ? WBC 10.7 (*)   ? Abs Immature Granulocytes 0.10 (*)   ? All other components within normal limits  ?BASIC METABOLIC PANEL - Abnormal; Notable for  the following components:  ? Sodium 131 (*)   ? Chloride 96 (*)   ? Glucose, Bld 106 (*)   ? BUN 24 (*)   ? Calcium 8.7 (*)   ? All other components within normal limits  ?RESP PANEL BY RT-PCR (FLU A&B, COVID) ARPGX2  ?PROCALCITONIN  ? ? ? ?EKG ? ? ? ? ?RADIOLOGY ?CT chest viewed and interpreted by me, shows bilateral infiltrates.  Radiology report reviewed.  Negative for PE ? ? ? ?PROCEDURES: ? ?Critical Care performed: No ? ?.1-3 Lead EKG Interpretation ?Performed by: Carrie Mew, MD ?Authorized by: Carrie Mew, MD  ? ?  Interpretation: abnormal   ?  ECG rate:  85 ?  Rhythm: atrial fibrillation   ?  Ectopy: none   ?  Conduction: normal   ? ? ?MEDICATIONS ORDERED IN ED: ?Medications  ?azithromycin (ZITHROMAX) 500 mg in sodium chloride 0.9 % 250 mL IVPB (500 mg Intravenous New Bag/Given 11/24/21 2255)  ?amiodarone (PACERONE) tablet 200 mg (has no administration in time range)  ?atorvastatin (LIPITOR) tablet  10 mg (has no administration in time range)  ?carvedilol (COREG) tablet 3.125 mg (has no administration in time range)  ?mexiletine (MEXITIL) capsule 150 mg (has no administration in time range)  ?spironolactone (ALDACTONE) tablet 25 mg (has no administration in time range)  ?levothyroxine (SYNTHROID) tablet 100 mcg (has no administration in time range)  ?apixaban (ELIQUIS) tablet 5 mg (has no administration in time range)  ?acetaminophen (TYLENOL) tablet 650 mg (has no administration in time range)  ?  Or  ?acetaminophen (TYLENOL) suppository 650 mg (has no administration in time range)  ?ondansetron (ZOFRAN) tablet 4 mg (has no administration in time range)  ?  Or  ?ondansetron (ZOFRAN) injection 4 mg (has no administration in time range)  ?cefTRIAXone (ROCEPHIN) 2 g in sodium chloride 0.9 % 100 mL IVPB (has no administration in time range)  ?azithromycin (ZITHROMAX) 500 mg in sodium chloride 0.9 % 250 mL IVPB (has no administration in time range)  ?iohexol (OMNIPAQUE) 350 MG/ML injection 75 mL (75  mLs Intravenous Contrast Given 11/24/21 1941)  ?cefTRIAXone (ROCEPHIN) 2 g in sodium chloride 0.9 % 100 mL IVPB (0 g Intravenous Stopped 11/24/21 2226)  ? ? ? ?IMPRESSION / MDM / ASSESSMENT AND PLAN / ED COURSE  ?I reviewed the triage vital signs and the nursing notes. ?             ?               ? ?Differential diagnosis includes, but is not limited to, PE, non-STEMI, pneumonia, pleural effusion, pulmonary edema, pericardial effusion ? ?Patient presents with shortness of breath.  Hypoxic on room air to 86%, normalized on 2 L nasal cannula.  He does have some tachypnea as well.  Other vital signs are normal.  This is primarily a respiratory illness.  CT angiogram is negative for PE but does reveal multifocal pneumonia.  Procalcitonin is negative.  The patient is not septic.  I will start him on antibiotics for community acquired pneumonia.  Case discussed with hospitalist for further management. ? ? ?  ? ? ?FINAL CLINICAL IMPRESSION(S) / ED DIAGNOSES  ? ?Final diagnoses:  ?Multifocal pneumonia  ?Acute respiratory failure with hypoxia ([place])  ?Chronic atrial fibrillation ? ? ?Rx / DC Orders  ? ?ED Discharge Orders   ? ? None  ? ?  ? ? ? ?Note:  This document was prepared using Dragon voice recognition software and may include unintentional dictation errors. ?  ?Carrie Mew, MD ?11/24/21 2358 ? ?

## 2021-11-24 NOTE — H&P (Signed)
?History and Physical  ? ? ?Patient: Austin Warren WEX:[id number] DOB: 07-26-1940 ?DOA: 11/24/2021 ?DOS: the patient was seen and examined on 11/24/2021 ?PCP: Kirk Ruths, MD  ?Patient coming from: Home ? ?Chief Complaint:  ?Chief Complaint  ?Patient presents with  ? Shortness of Breath  ? ? ?HPI: Austin Warren is a 81 y.o. male with medical history significant for nonobstructive coronary artery disease, persistent atrial fibrillation/flutter, recurrent ventricular tachycardia, chronic HFrEF due to nonischemic cardiomyopathy, hypertension, hyperlipidemia, stroke, and hypothyroidism Who was sent to the ED by his cardiologist, Dr. Harrell Gave End for evaluation of dyspnea with minimal exertion that has been ongoing for the past month but got acutely worse over the past several days.  His last echo showed EF 20 to 25% and he had a right heart cath a month ago.  He has no orthopnea but does have lower extremity edema, and a nonproductive cough.  He is compliant with his medication ?ED course and data review: BP soft at 101/79 and 115/54 and tachypneic to 30 with O2 sat as low as 86% on room air and intermittently tachycardic to 104.  WBC 10,700 with otherwise normal CBC.  BMP significant for sodium of 131.  BNP done earlier in the cardiology office was 742.  CTA chest PE protocol showed the following: ? ?MPRESSION: ?1. No evidence of pulmonary embolus. ?2. Bilateral multifocal airspace disease, with greatest ?consolidation seen at the right lung base. Findings favor multifocal ?bilateral pneumonia over edema. ?3. Likely reactive mediastinal lymphadenopathy. ?4. Marked cardiomegaly. ?5. 4.8 cm ascending thoracic aortic aneurysm, previously measuring ?4.6 cm by my measurement. Recommend semi-annual imaging followup by ?CTA or MRA and referral to cardiothoracic surgery if not already ?obtained. This recommendation follows 2010 ?ACCF/AHA/AATS/ACR/ASA/SCA/SCAI/SIR/STS/SVM Guidelines for the ?Diagnosis and  Management of Patients With Thoracic Aortic Disease. ?Circulation. 2010; 121: L381-O175. Aortic aneurysm NOS (ICD10-I71.9) ?6.  Aortic Atherosclerosis (ICD10-I70.0). ? ?Patient was started on azithromycin and Rocephin.  Hospitalist consulted for admission.  ? ?Review of Systems: As mentioned in the history of present illness. All other systems reviewed and are negative. ?Past Medical History:  ?Diagnosis Date  ? Arthritis   ? Benign prostatic hypertrophy   ? Bursitis of left shoulder   ? CHF (congestive heart failure) ([place])   ? Coronary artery disease 08/2019  ? Moderate multivessel CAD (not hemodynamically significant by CT-FFR)  ? Diabetes ([place])   ? Dyspnea on exertion 11/01/2011  ? Elevated PSA   ? History of colon polyps   ? History of colon polyps   ? Hyperlipidemia   ? Hypertension   ? Melanoma ([place])   ? under arm  ? Microscopic hematuria   ? Osteoarthritis   ? Retroperitoneal mass   ? Stroke [facility]) 05/2014  ? TIA (transient ischemic attack)   ? Urinary retention   ? UTI (urinary tract infection)   ? Ventricular tachycardia ([place])   ? RBB/LAHB ideopathic VT, 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  ? ?Past Surgical History:  ?Procedure Laterality Date  ? CARDIOVERSION    ? CARDIOVERSION N/A 03/07/2018  ? Procedure: CARDIOVERSION;  Surgeon: Wellington Hampshire, MD;  Location: [facility] ORS;  Service: Cardiovascular;  Laterality: N/A;  ? CARDIOVERSION N/A 04/06/2019  ? Procedure: CARDIOVERSION;  Surgeon: Wellington Hampshire, MD;  Location: [facility] ORS;  Service: Cardiovascular;  Laterality: N/A;  ? CARDIOVERSION N/A 06/02/2019  ? Procedure: CARDIOVERSION;  Surgeon: Nelva Bush, MD;  Location: [facility] ORS;  Service: Cardiovascular;  Laterality: N/A;  ? CARDIOVERSION N/A 07/25/2021  ?  Procedure: CARDIOVERSION;  Surgeon: Nelva Bush, MD;  Location: [facility] ORS;  Service: Cardiovascular;  Laterality: N/A;  ? COLONOSCOPY WITH PROPOFOL N/A 10/17/2015  ? Procedure: COLONOSCOPY WITH PROPOFOL;  Surgeon: Manya Silvas, MD;  Location: [facility]  ENDOSCOPY;  Service: Endoscopy;  Laterality: N/A;  ? COLONOSCOPY WITH PROPOFOL N/A 03/17/2019  ? Procedure: COLONOSCOPY WITH PROPOFOL;  Surgeon: Toledo, Benay Pike, MD;  Location: [facility] ENDOSCOPY;  Service: Gastroenterology;  Laterality: N/A;  ? ELECTROPHYSIOLOGIC STUDY N/A 12/07/2015  ? Procedure: V Tach Ablation;  Surgeon: Will Cass Leeds, MD;  Location: [place] CV LAB;  Service: Cardiovascular;  Laterality: N/A;  ? JOINT REPLACEMENT    ? R TKR  ? RADIOLOGY WITH ANESTHESIA N/A 05/18/2014  ? Procedure: RADIOLOGY WITH ANESTHESIA;  Surgeon: Rob Hickman, MD;  Location: [place];  Service: Radiology;  Laterality: N/A;  ? REPLACEMENT TOTAL KNEE    ? REPLACEMENT TOTAL KNEE Left   ? RIGHT/LEFT HEART CATH AND CORONARY ANGIOGRAPHY N/A 10/30/2021  ? Procedure: RIGHT/LEFT HEART CATH AND CORONARY ANGIOGRAPHY;  Surgeon: Nelva Bush, MD;  Location: [place] CV LAB;  Service: Cardiovascular;  Laterality: N/A;  ? VASECTOMY    ? ?Social History:  reports that he quit smoking about 51 years ago. His smoking use included cigarettes. He started smoking about 61 years ago. He has a 20.00 pack-year smoking history. He has never used smokeless tobacco. He reports current alcohol use. He reports that he does not use drugs. ? ?Allergies  ?Allergen Reactions  ? Ciprofloxacin Anaphylaxis  ? Levofloxacin Anaphylaxis  ?  "throat closes"  ? Enalapril Maleate Swelling  ?  Angioedema  ? Metoprolol Swelling  ?  edema  ? Sulfa Antibiotics Rash and Other (See Comments)  ?  Hypotension  ? ? ?Family History  ?Problem Relation Age of Onset  ? Stroke Father   ? Hypertension Father   ? Heart attack Neg Hx   ? Kidney disease Neg Hx   ? Prostate cancer Neg Hx   ? Kidney cancer Neg Hx   ? Bladder Cancer Neg Hx   ? ? ?Prior to Admission medications   ?Medication Sig Start Date End Date Taking? Authorizing Provider  ?amiodarone (PACERONE) 200 MG tablet TAKE 1 TABLET BY MOUTH DAILY 03/08/21   End, Harrell Gave, MD  ?apixaban (ELIQUIS) 5 MG TABS  tablet Take 1 tablet (5 mg total) by mouth every 12 (twelve) hours. 10/31/21   End, Harrell Gave, MD  ?Ascorbic Acid (VITAMIN C) 500 MG tablet Take 500 mg by mouth daily.      [provider]  ?atorvastatin (LIPITOR) 10 MG tablet Take 10 mg by mouth daily. 01/12/20   [provider]  ?carvedilol (COREG) 3.125 MG tablet TAKE 1 TABLET BY MOUTH TWICE DAILY 11/20/21   End, Harrell Gave, MD  ?Cholecalciferol (VITAMIN D3) 1000 UNITS CAPS Take 1,000 Units by mouth daily.     [provider]  ?docusate sodium (COLACE) 50 MG capsule Take 50 mg by mouth 2 (two) times daily.    [provider]  ?finasteride (PROSCAR) 5 MG tablet TAKE 1 TABLET BY MOUTH DAILY 04/17/21   Zara Council A, PA-C  ?levothyroxine (SYNTHROID) 100 MCG tablet Take 1 tablet (100 mcg total) by mouth daily before breakfast. 08/19/19   Deboraha Sprang, MD  ?mexiletine (MEXITIL) 150 MG capsule TAKE 1 CAPSULE BY MOUTH TWICE DAILY 09/11/21   End, Harrell Gave, MD  ?Multiple Vitamin (MULTIVITAMIN WITH MINERALS) TABS tablet Take 1 tablet by mouth daily. One A Day 50+  [provider]  ?spironolactone (ALDACTONE) 25 MG tablet TAKE ONE TABLET BY MOUTH EVERY DAY 02/01/21   End, Harrell Gave, MD  ?tamsulosin (FLOMAX) 0.4 MG CAPS capsule TAKE 2 CAPSULES BY MOUTH DAILY 10/16/21   Zara Council A, PA-C  ?torsemide (DEMADEX) 10 MG tablet Take 20 mg by mouth daily.    [provider]  ? ? ?Physical Exam: ?Vitals:  ? 11/24/21 2030 11/24/21 2100 11/24/21 2130 11/24/21 2200  ?BP: 101/79 113/69 (!) 115/54 113/75  ?Pulse: 95 86 91 81  ?Resp: (!) 28 (!) 30 (!) 21 (!) 27  ?Temp:      ?SpO2: 100% 100% 99% 99%  ?Weight:      ?Height:      ? ?Physical Exam ?Vitals and nursing note reviewed.  ?Constitutional:   ?   General: He is not in acute distress. ?HENT:  ?   Head: Normocephalic and atraumatic.  ?Cardiovascular:  ?   Rate and Rhythm: Normal rate and regular rhythm.  ?   Heart sounds: Normal heart sounds.  ?Pulmonary:  ?   Effort:  Pulmonary effort is normal. Tachypnea present.  ?   Breath sounds: Decreased breath sounds present.  ?Abdominal:  ?   Palpations: Abdomen is soft.  ?   Tenderness: There is no abdominal tenderness.  ?Neurological:  ?

## 2021-11-24 NOTE — Assessment & Plan Note (Addendum)
Recent left heart catheterization, which showed mild, non-obstructive coronary artery disease.  ?Continue carvedilol, atorvastatin. ?

## 2021-11-24 NOTE — Telephone Encounter (Signed)
I spoke with Mr. Schiffman wife regarding results of his chest radiograph and labs today.  Labs are notable for elevated BNP, albeit down a little bit since 10/19/2021.  This finding is nonspecific in the setting of his chronic HFrEF with EF of 20-25%.  I am most concerned by his chest radiograph, which shows patchy bilateral airspace opacities, right greater than left, with more focal consolidation at the right lung base.  Certainly, this could represent infection, including atypical, and possibly superimposed pulmonary edema or even amiodarone toxicity.  Given his increased work of breathing and significant shortness of breath over the last month, I have advised him to go to the emergency department for further evaluation.  I have spoken with the triage nurse at [facility] regarding my findings and recommendations.  It may be beneficial to obtain a CT of the chest to better evaluate his lung findings on today's chest radiograph. ? ?Nelva Bush, MD ?[facility] ? ?

## 2021-11-24 NOTE — Assessment & Plan Note (Addendum)
Likely medication related.  Currently on carvedilol, spironolactone and torsemide.  BP borderline low at 115/54 so we will continue home meds cautiously.  hold torsemide ? ?Monitor him in stepdown ?

## 2021-11-24 NOTE — Assessment & Plan Note (Addendum)
Continue amiodarone and mexiletine.  Patient follows with electrophysiologist, Dr. Delice Lesch ? ?His heart rate is ranging from 120-140 at rest now so I have reached out to Dr. Quentin Ore for further input ? ?Continue Coreg and amiodarone as ordered ?

## 2021-11-25 ENCOUNTER — Inpatient Hospital Stay: Payer: Medicare Other

## 2021-11-25 ENCOUNTER — Encounter: Payer: Self-pay | Admitting: Internal Medicine

## 2021-11-25 DIAGNOSIS — I428 Other cardiomyopathies: Secondary | ICD-10-CM | POA: Diagnosis not present

## 2021-11-25 DIAGNOSIS — J189 Pneumonia, unspecified organism: Secondary | ICD-10-CM | POA: Diagnosis not present

## 2021-11-25 DIAGNOSIS — J9601 Acute respiratory failure with hypoxia: Secondary | ICD-10-CM | POA: Diagnosis not present

## 2021-11-25 DIAGNOSIS — I4821 Permanent atrial fibrillation: Secondary | ICD-10-CM | POA: Diagnosis not present

## 2021-11-25 LAB — LACTIC ACID, PLASMA
Lactic Acid, Venous: 3 mmol/L (ref 0.5–1.9)
Lactic Acid, Venous: 2.6 mmol/L (ref 0.5–1.9)
Lactic Acid, Venous: 1.5 mmol/L (ref 0.5–1.9)

## 2021-11-25 LAB — BLOOD GAS, ARTERIAL
Acid-base deficit: 3.3 mmol/L — ABNORMAL HIGH (ref 0.0–2.0)
Bicarbonate: 20.3 mmol/L (ref 20.0–28.0)
Delivery systems: POSITIVE
Expiratory PAP: 6 cmH2O
FIO2: 50 %
Inspiratory PAP: 12 cmH2O
O2 Saturation: 96.3 %
Patient temperature: 37
pCO2 arterial: 32 mmHg (ref 32–48)
pH, Arterial: 7.41 (ref 7.35–7.45)
pO2, Arterial: 74 mmHg — ABNORMAL LOW (ref 83–108)

## 2021-11-25 LAB — CBC WITH DIFFERENTIAL/PLATELET
Abs Immature Granulocytes: 0.14 10*3/uL — ABNORMAL HIGH (ref 0.00–0.07)
Basophils Absolute: 0.1 10*3/uL (ref 0.0–0.1)
Basophils Relative: 1 %
Eosinophils Absolute: 0.1 10*3/uL (ref 0.0–0.5)
Eosinophils Relative: 1 %
HCT: 46.4 % (ref 39.0–52.0)
Hemoglobin: 15.3 g/dL (ref 13.0–17.0)
Immature Granulocytes: 1 %
Lymphocytes Relative: 13 %
Lymphs Abs: 1.7 10*3/uL (ref 0.7–4.0)
MCH: 31.9 pg (ref 26.0–34.0)
MCHC: 33 g/dL (ref 30.0–36.0)
MCV: 96.9 fL (ref 80.0–100.0)
Monocytes Absolute: 1.3 10*3/uL — ABNORMAL HIGH (ref 0.1–1.0)
Monocytes Relative: 10 %
Neutro Abs: 9.6 10*3/uL — ABNORMAL HIGH (ref 1.7–7.7)
Neutrophils Relative %: 74 %
Platelets: 324 10*3/uL (ref 150–400)
RBC: 4.79 MIL/uL (ref 4.22–5.81)
RDW: 14 % (ref 11.5–15.5)
WBC: 13 10*3/uL — ABNORMAL HIGH (ref 4.0–10.5)
nRBC: 0 % (ref 0.0–0.2)

## 2021-11-25 LAB — GLUCOSE, CAPILLARY
Glucose-Capillary: 123 mg/dL — ABNORMAL HIGH (ref 70–99)
Glucose-Capillary: 159 mg/dL — ABNORMAL HIGH (ref 70–99)

## 2021-11-25 LAB — HEPATIC FUNCTION PANEL
ALT: 19 U/L (ref 0–44)
AST: 31 U/L (ref 15–41)
Albumin: 2.7 g/dL — ABNORMAL LOW (ref 3.5–5.0)
Alkaline Phosphatase: 165 U/L — ABNORMAL HIGH (ref 38–126)
Bilirubin, Direct: 0.3 mg/dL — ABNORMAL HIGH (ref 0.0–0.2)
Indirect Bilirubin: 1.1 mg/dL — ABNORMAL HIGH (ref 0.3–0.9)
Total Bilirubin: 1.4 mg/dL — ABNORMAL HIGH (ref 0.3–1.2)
Total Protein: 7.4 g/dL (ref 6.5–8.1)

## 2021-11-25 LAB — BASIC METABOLIC PANEL
Anion gap: 13 (ref 5–15)
BUN: 24 mg/dL — ABNORMAL HIGH (ref 8–23)
CO2: 19 mmol/L — ABNORMAL LOW (ref 22–32)
Calcium: 8.2 mg/dL — ABNORMAL LOW (ref 8.9–10.3)
Chloride: 97 mmol/L — ABNORMAL LOW (ref 98–111)
Creatinine, Ser: 0.9 mg/dL (ref 0.61–1.24)
GFR, Estimated: >60 mL/min (ref >60)
Glucose, Bld: 153 mg/dL — ABNORMAL HIGH (ref 70–99)
Potassium: 4.1 mmol/L (ref 3.5–5.1)
Sodium: 129 mmol/L — ABNORMAL LOW (ref 135–145)

## 2021-11-25 LAB — MAGNESIUM: Magnesium: 2.2 mg/dL (ref 1.7–2.4)

## 2021-11-25 LAB — RESP PANEL BY RT-PCR (FLU A&B, COVID) ARPGX2
Influenza A by PCR: NEGATIVE
Influenza B by PCR: NEGATIVE
SARS Coronavirus 2 by RT PCR: NEGATIVE

## 2021-11-25 LAB — APTT: aPTT: 36 seconds (ref 24–36)

## 2021-11-25 LAB — TROPONIN I (HIGH SENSITIVITY): Troponin I (High Sensitivity): 32 ng/L — ABNORMAL HIGH (ref <18)

## 2021-11-25 LAB — HEMOGLOBIN A1C
Hgb A1c MFr Bld: 5.3 % (ref 4.8–5.6)
Mean Plasma Glucose: 105.41 mg/dL

## 2021-11-25 LAB — PHOSPHORUS: Phosphorus: 4.5 mg/dL (ref 2.5–4.6)

## 2021-11-25 LAB — MRSA NEXT GEN BY PCR, NASAL: MRSA by PCR Next Gen: NOT DETECTED

## 2021-11-25 LAB — CBG MONITORING, ED: Glucose-Capillary: 181 mg/dL — ABNORMAL HIGH (ref 70–99)

## 2021-11-25 LAB — PROTIME-INR
INR: 2.1 — ABNORMAL HIGH (ref 0.8–1.2)
Prothrombin Time: 22.9 seconds — ABNORMAL HIGH (ref 11.4–15.2)

## 2021-11-25 MED ORDER — FUROSEMIDE 10 MG/ML IJ SOLN
80.0000 mg | Freq: Once | INTRAMUSCULAR | Status: AC
Start: 2021-11-25 — End: 2021-11-25

## 2021-11-25 MED ORDER — AMIODARONE HCL IN DEXTROSE 360-4.14 MG/200ML-% IV SOLN: 30.0000 mg/h | INTRAVENOUS | Status: DC

## 2021-11-25 MED ORDER — IPRATROPIUM-ALBUTEROL 0.5-2.5 (3) MG/3ML IN SOLN
3.0000 mL | RESPIRATORY_TRACT | Status: DC | PRN
  Filled 2021-11-25: qty 3

## 2021-11-25 MED ORDER — FUROSEMIDE 10 MG/ML IJ SOLN
INTRAMUSCULAR | Status: AC
  Administered 2021-11-25: 80 mg via INTRAVENOUS
  Filled 2021-11-25: qty 8

## 2021-11-25 MED ORDER — FUROSEMIDE 10 MG/ML IJ SOLN
10.0000 mg/h | INTRAVENOUS | Status: DC
  Filled 2021-11-25: qty 20

## 2021-11-25 MED ORDER — NOREPINEPHRINE 4 MG/250ML-% IV SOLN
2.0000 ug/min | INTRAVENOUS | Status: DC
  Administered 2021-11-25: 2 ug/min via INTRAVENOUS
  Administered 2021-11-26: 4 ug/min via INTRAVENOUS
  Administered 2021-11-28: 2 ug/min via INTRAVENOUS
  Filled 2021-11-25: qty 250

## 2021-11-25 MED ORDER — SODIUM CHLORIDE 0.9 % IV SOLN
250.0000 mL | INTRAVENOUS | Status: DC
  Administered 2021-11-25: 250 mL via INTRAVENOUS

## 2021-11-25 MED ORDER — INSULIN ASPART 100 UNIT/ML IJ SOLN
0.0000 [IU] | INTRAMUSCULAR | Status: DC
  Administered 2021-11-25: 2 [IU] via SUBCUTANEOUS
  Administered 2021-11-25: 1 [IU] via SUBCUTANEOUS
  Administered 2021-11-25: 2 [IU] via SUBCUTANEOUS
  Administered 2021-11-26: 1 [IU] via SUBCUTANEOUS
  Administered 2021-11-26 (×2): 2 [IU] via SUBCUTANEOUS
  Filled 2021-11-25 (×6): qty 1

## 2021-11-25 MED ORDER — SODIUM CHLORIDE 0.9 % IV SOLN
500.0000 mg | INTRAVENOUS | Status: AC
  Administered 2021-11-25 – 2021-11-27 (×3): 500 mg via INTRAVENOUS
  Filled 2021-11-25: qty 500
  Filled 2021-11-25: qty 5
  Filled 2021-11-25: qty 500

## 2021-11-25 MED ORDER — IPRATROPIUM-ALBUTEROL 0.5-2.5 (3) MG/3ML IN SOLN
3.0000 mL | Freq: Four times a day (QID) | RESPIRATORY_TRACT | Status: DC
Start: 2021-11-25 — End: 2021-11-26
  Administered 2021-11-25 (×2): 3 mL via RESPIRATORY_TRACT
  Filled 2021-11-25 (×2): qty 3

## 2021-11-25 MED ORDER — MORPHINE SULFATE (PF) 2 MG/ML IV SOLN
2.0000 mg | INTRAVENOUS | Status: DC | PRN
  Administered 2021-11-25 – 2021-11-27 (×2): 2 mg via INTRAVENOUS
  Filled 2021-11-25 (×2): qty 1

## 2021-11-25 MED ORDER — MORPHINE SULFATE (PF) 2 MG/ML IV SOLN
2.0000 mg | INTRAVENOUS | Status: AC
  Administered 2021-11-25: 2 mg via INTRAVENOUS
  Filled 2021-11-25: qty 1

## 2021-11-25 MED ORDER — METOPROLOL TARTRATE 5 MG/5ML IV SOLN
2.5000 mg | INTRAVENOUS | Status: AC
  Administered 2021-11-25: 2.5 mg via INTRAVENOUS
  Filled 2021-11-25: qty 5

## 2021-11-25 MED ORDER — METHYLPREDNISOLONE SODIUM SUCC 40 MG IJ SOLR
40.0000 mg | Freq: Two times a day (BID) | INTRAMUSCULAR | Status: AC
  Administered 2021-11-25 – 2021-11-27 (×4): 40 mg via INTRAVENOUS
  Filled 2021-11-25 (×4): qty 1

## 2021-11-25 MED ORDER — CHLORHEXIDINE GLUCONATE CLOTH 2 % EX PADS
6.0000 | MEDICATED_PAD | Freq: Every day | CUTANEOUS | Status: DC
  Administered 2021-11-25 – 2021-12-01 (×7): 6 via TOPICAL

## 2021-11-25 MED ORDER — HEPARIN (PORCINE) 25000 UT/250ML-% IV SOLN
1400.0000 [IU]/h | INTRAVENOUS | Status: DC
  Administered 2021-11-25: 1100 [IU]/h via INTRAVENOUS
  Administered 2021-11-26: 1400 [IU]/h via INTRAVENOUS
  Filled 2021-11-25 (×3): qty 250

## 2021-11-25 MED ORDER — FUROSEMIDE 10 MG/ML IJ SOLN
120.0000 mg | Freq: Once | INTRAVENOUS | Status: DC
  Filled 2021-11-25: qty 12

## 2021-11-25 MED ORDER — AMIODARONE HCL IN DEXTROSE 360-4.14 MG/200ML-% IV SOLN: 60.0000 mg/h | INTRAVENOUS | Status: DC

## 2021-11-25 MED ORDER — NOREPINEPHRINE 4 MG/250ML-% IV SOLN
INTRAVENOUS | Status: AC
Start: 2021-11-25 — End: 2021-11-25
  Filled 2021-11-25: qty 250

## 2021-11-25 MED ORDER — AZITHROMYCIN 500 MG PO TABS: 500.0000 mg | ORAL_TABLET | Freq: Every day | ORAL | Status: DC

## 2021-11-25 NOTE — ED Notes (Signed)
Pt repositioned in bed.

## 2021-11-25 NOTE — ED Notes (Signed)
Pt with sats of 97-98% on room air, noted after pt had removed O2 [place]. O2 [place] removed,  will continue to monitor respiratory status. RR 20-26 while this RN in room.  ?

## 2021-11-25 NOTE — ED Notes (Signed)
Pt helped to toilet with cane and one assist, gait steady. Pt had large soft BM per pt. Pt helped back to bed, pt placed on O2 2 L [place] as RR was increased to 30-33 during exertion. ?

## 2021-11-25 NOTE — Progress Notes (Signed)
Post morphine administration pts increased work of breathing has declined significantly and pt currently resting in bed with Bipap in place.  Discussed code status again with pt and pts wife at bedside they have decided to change code status to [place].  Will continue to monitor and assess pt  ? ?[name], AGNP  ?Pulmonary/Critical Care ?Pager [phone] (please enter 7 digits) ?PCCM Consult Pager [phone] (please enter 7 digits)  ?

## 2021-11-25 NOTE — Consult Note (Signed)
? ?NAME:  Austin Warren, MRN:  [id number], DOB:  1940/12/12, LOS: 1 ?ADMISSION DATE:  11/24/2021, CONSULTATION DATE: 11/25/21 ?REFERRING MD: Dr. Manuella Ghazi, CHIEF COMPLAINT: Shortness of Breath   ? ?History of Present Illness:  ?This is an 81 yo male who presented to [facility] ER on 05/12 with c/o worsening shortness of breath worse over the past 4 days.  He has had a hx of shortness of breath over the past few monthsHe has a hx of chronic systolic heart failure due to nonischemic cardiomyopathy with recent cardiac cath on 10/30/21 due to continued acute on chronic respiratory failure findings were consistent with non-ischemic cardiomyopathy with recommendations for continued gentle diuresis.  He saw his outpatient cardiologist on 05/12 CXR obtained which revealed possible pneumonia vs. pulmonary edema vs. amiodarone toxicity.  Therefore, pt instructed to proceed to the ER for further evaluation. ? ?ED Course ?Upon arrival to the ER pt placed on 2L O2 via nasal canula with O2 sats in the upper 90's.  Lab results revealed pct <0.10, Na+ 133, glucose 138, BUN 24, alk phos 165, and Flu A&B/Covid negative.  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  He received azithromycin and ceftriaxone.  He was subsequently admitted to the progressive care unit per hospitalist team, however he remained in the ER pending bed availability. ? ?Hospital Course ?On 05/13 while in the ER pt developed worsening acute on chronic hypoxic respiratory failure with increase in O2 requirements and atrial fibrillation with rvr.  Pt changed to stepdown status.  PCCM team consulted to assist with management.  Pt received 40 mg iv lasix; 5 mg of iv metoprolol; and placed on Bipap.    ? ?Pertinent  Medical History  ?Arthritis ?BPH ?Chronic Systolic CHF~EF 20 to 92% via Echo 10/19/21 ?CAD ?Type II Diabetes Mellitus  ?Elevated  PSA ?Colon Polyps ?HLD ?HTN ?Melanoma ?Microscopic Hematuria  ?Stroke (2015) ?TIA ?Urinary Retention ?Ventricular Tachycardia ? ?Significant Hospital Events: ?Including procedures, antibiotic start and stop dates in addition to other pertinent events   ?5/12: Pt admitted to the progressive care unit with acute on chronic respiratory failure secondary to multifocal pneumonia  ?5/13: Pt developed worsening acute on chronic hypoxic respiratory failure secondary to worsening multifocal pneumonia vs. pulmonary edema requiring Bipap along with atrial fibrillation with rvr.  Pt changed to stepdown status.  PCCM team consulted   ? ?Interim History / Subjective:  ?Pt sitting on side of bed with increased work of breathing and endorses worsening shortness of breath onset this afternoon.  He is currently on 4L O2 via nasal canula with O2 sats 93% and respiratory rate in the 40's.  EKG revealed atrial flutter/fibrillation hr 146 ? ?Objective   ?Blood pressure 127/89, pulse (!) 150, temperature 97.7 ?F (36.5 ?C), temperature source Oral, resp. rate (!) 44, height 5' 6"  (1.676 m), weight 74.8 kg, SpO2 91 %. ?   ?   ? ?Intake/Output Summary (Last 24 hours) at 11/25/2021 1549 ?Last data filed at 11/24/2021 2355 ?Gross per 24 hour  ?Intake 350.37 ml  ?Output --  ?Net 350.37 ml  ? ?Filed Weights  ? 11/24/21 1907  ?Weight: 74.8 kg  ? ? ?Examination: ?General: Acutely ill appearing elderly male, in respiratory distress on 4L O2 via nasal canula ?HENT: Supple, mild JVD  ?Lungs: Faint crackles throughout, tachypneic with use of accessory muscles ?Cardiovascular: Irregular irregular, 2+ radial/1+ distal pulses, no edema  ?  Abdomen: + BS x4, soft, non tender, non distended  ?Extremities: Normal bulk and tone, moves all extremities  ?Neuro: Alert and oriented, follows commands, PERRLA  ?GU: Indwelling foley catheter draining clear yellow urine  ? ?Resolved Hospital Problem list   ? ? ?Assessment & Plan:  ?Acute on chronic hypoxic respiratory  failure secondary to multifocal pneumonia and pulmonary edema  ?- Prn Bipap or supplemental O2 for dyspnea and/or hypoxia  ?- Maintain O2 sats >92% ?- Scheduled and prn bronchodilator therapy  ?- Trend WBC and monitor fever curve  ?- Trend PCT  ?- Continue azithromycin and ceftriaxone  ?- HIGH RISK FOR INTUBATION  ? ?Chronic systolic CHF  ?Atrial flutter/fibrillation with rvr~improving  ?Mildly elevated troponin secondary to demand ischemia in setting of respiratory failure  ?- Continuous telemetry monitoring ?- Diurese as bp tolerates ?- Cardiology consulted appreciate input: rate controlling agents per recommendations  ?- Continue outpatient mexiletine and azithromycin   ?- Heparin gtt: dosing per pharmacy  ?- TSH with thyroid panel pending  ? ?Hyponatremia  ?Compensated metabolic acidosis  ?Lactic acidosis  ?- Trend BMP, VBG, and lactic acid ?- Replace electrolytes as indicated  ?- Monitor UOP  ? ?Type II diabetes mellitus  ?- CBG's q4hrs ?- SSI  ? ?Best Practice (right click and "Reselect all SmartList Selections" daily)  ? ?Diet/type: NPO ?DVT prophylaxis: systemic heparin ?GI prophylaxis: N/A ?Lines: N/A ?Foley:  Yes, and it is still needed ?Code Status:  full code ?Last date of multidisciplinary goals of care discussion [N/A] ? ?Updated pts wife at bedside regarding decline in pts condition and current plan of care.  Discussed code status with pts wife at this time she still needs time to decide if she would want pt mechanically intubated if his respiratory status were to decline further ?Labs   ?CBC: ?Recent Labs  ?Lab 11/24/21 ?1020 11/24/21 ?1920 11/25/21 ?1527  ?WBC 9.3 10.7* 13.0*  ?NEUTROABS  --  7.2 9.6*  ?HGB 14.4 15.8 15.3  ?HCT 43.7 47.5 46.4  ?MCV 95.2 95.4 96.9  ?PLT 245 289 324  ? ? ?Basic Metabolic Panel: ?Recent Labs  ?Lab 11/24/21 ?1020 11/24/21 ?1920  ?NA 133* 131*  ?K 3.5 3.6  ?CL 99 96*  ?CO2 26 25  ?GLUCOSE 138* 106*  ?BUN 24* 24*  ?CREATININE 0.90 0.94  ?CALCIUM 8.6* 8.7*   ? ?GFR: ?Estimated Creatinine Clearance: 53.7 mL/min (by C-G formula based on SCr of 0.94 mg/dL). ?Recent Labs  ?Lab 11/24/21 ?1020 11/24/21 ?1920 11/25/21 ?1527  ?PROCALCITON  --  <0.10  --   ?WBC 9.3 10.7* 13.0*  ? ? ?Liver Function Tests: ?Recent Labs  ?Lab 11/24/21 ?1020  ?AST 27  ?ALT 19  ?ALKPHOS 165*  ?BILITOT 1.5*  ?PROT 7.4  ?ALBUMIN 2.9*  ? ?No results for input(s): LIPASE, AMYLASE in the last 168 hours. ?No results for input(s): AMMONIA in the last 168 hours. ? ?ABG ?   ?Component Value Date/Time  ? PHART 7.49 (H) 11/25/2021 1527  ? PCO2ART 26 (L) 11/25/2021 1527  ? PO2ART 69 (L) 11/25/2021 1527  ? HCO3 19.8 (L) 11/25/2021 1527  ? TCO2 26 10/30/2021 1026  ? ACIDBASEDEF 2.1 (H) 11/25/2021 1527  ? O2SAT 93.5 11/25/2021 1527  ?  ? ?Coagulation Profile: ?No results for input(s): INR, PROTIME in the last 168 hours. ? ?Cardiac Enzymes: ?No results for input(s): CKTOTAL, CKMB, CKMBINDEX, TROPONINI in the last 168 hours. ? ?HbA1C: ?Hgb A1c MFr Bld  ?Date/Time Value Ref Range Status  ?04/07/2019 03:36 PM 5.2  4.8 - 5.6 % Final  ?  Comment:  ?  (NOTE) ?        Prediabetes: 5.7 - 6.4 ?        Diabetes: >6.4 ?        Glycemic control for adults with diabetes: <7.0 ?  ?05/19/2014 05:10 AM 6.3 (H) <5.7 % Final  ?  Comment:  ?  (NOTE) ?                                                                      ?According to the ADA Clinical Practice Recommendations for 2011, when ?HbA1c is used as a screening test: ? >=6.5%   Diagnostic of Diabetes Mellitus ?          (if abnormal result is confirmed) ?5.7-6.4%   Increased risk of developing Diabetes Mellitus ?References:Diagnosis and Classification of Diabetes Mellitus,Diabetes ?UQJF,3545,62(BWLSL 1):S62-S69 and Standards of Medical Care in         ?Diabetes - 2011,Diabetes Care,2011,34 (Suppl 1):S11-S61. ?  ? ? ?CBG: ?No results for input(s): GLUCAP in the last 168 hours. ? ?Review of Systems: Positives in BOLD   ?Gen: Denies fever, chills, weight change, fatigue, night  sweats ?HEENT: Denies blurred vision, double vision, hearing loss, tinnitus, sinus congestion, rhinorrhea, sore throat, neck stiffness, dysphagia ?PULM: shortness of breath, cough, sputum production, hemoptysis, wheezin

## 2021-11-25 NOTE — Progress Notes (Signed)
?Progress Note ? ? ?Patient: Austin Warren:[id number] DOB: 06-07-1941 DOA: 11/24/2021     1 ?DOS: the patient was seen and examined on 11/25/2021 ?  ?Brief hospital course: ?81 year old male with a known history of nonobstructive coronary artery disease, persistent atrial fibrillation/flutter, recurrent ventricular tachycardia, chronic HFrEF due to nonischemic cardiomyopathy, hypertension, hyperlipidemia, stroke, and hypothyroidism admitted for multifocal pneumonia ? ?5/13: Improving on antibiotics ? ? ?Assessment and Plan: ?* Acute respiratory failure with hypoxia (HCC) ?Ruled out for PE with CTA chest ?Suspect multifactorial and related mostly to multifactorial pneumonia but also in part chronic systolic heart failure.  Patient is on amiodarone fluid was concern for pulmonary fibrosis ?Treat pneumonia as outlined below ?Supplemental oxygen to keep sats over 92%-his oxygen requirement has gone up from 2 L to 4 L now ? ?He is getting more tachypneic, hypoxic and tachycardic now.  I will change him from progressive care to stepdown level of care ?I have reached out to cardiology Dr. Quentin Ore and ICU team to evaluate him as I am concerned for his clinical decompensation ? ?Multifocal pneumonia ?Continue Rocephin and azithromycin ?Antitussives, DuoNebs as needed and supplemental oxygen ?Pulmonary consult ? ?Hypotension, with history of essential hypertension ?Likely medication related.  Currently on carvedilol, spironolactone and torsemide.  BP borderline low at 115/54 so we will continue home meds cautiously.  hold torsemide ? ?Monitor him in stepdown ? ?Chronic HFrEF (heart failure with reduced ejection fraction) ([place]) ?Does not appear to be acutely exacerbated.  Patient was evaluated by his cardiologist yesterday in the office.  Findings on chest x-ray favoring multifocal pneumonia rather than edema ?Continue carvedilol and spironolactone as blood pressure will tolerate ?Daily weights with intake and output  monitoring ?Net IO Since Admission: 350.37 mL [11/25/21 1501]  ? ? ? ?NICM (nonischemic cardiomyopathy) ([place]) ?Had recent right and left heart catheterization, which showed mild, non-obstructive coronary artery disease. Right and left heart filling pressures were mild elevated  ? ? ?Permanent atrial fibrillation ([place]) ?Continue amiodarone, Coreg for rate control and apixaban for anticoagulation ? ?Nurse notified for his resting heart rate ranging from 120-140.  We will request cardiology to evaluate him for further recommendation ? ?History of CVA (cerebrovascular accident) ?Continue apixaban and atorvastatin ? ?Coronary artery disease involving native coronary artery of native heart without angina pectoris ?Recent left heart catheterization, which showed mild, non-obstructive coronary artery disease.  ?Continue carvedilol, atorvastatin. ? ?Hypothyroidism ?Continue levothyroxine ? ?History of ventricular tachycardia ?Continue amiodarone and mexiletine.  Patient follows with electrophysiologist, Dr. Delice Lesch ? ?His heart rate is ranging from 120-140 at rest now so I have reached out to Dr. Quentin Ore for further input ? ?Continue Coreg and amiodarone as ordered ? ?Type 2 diabetes mellitus with other specified complication ([place]) ?Sliding scale insulin ? ?Type 2 diabetes mellitus with other circulatory complications ([place]) ?Continue sliding scale insulin coverage ? ? ?Addendum: ? -Nursing notified that patient has become more tachycardic, tachypneic and worsening shortness of breath.  His resting heart rate is 120-140. ?-I have requested pulmonary and cardiology to evaluate him - changed his bed status to stepdown ?-Stopped spironolactone and started him on Lasix and amiodarone drip per cardiology request ? ?  ? ?Subjective: Tachycardic, tachypneic and feeling short of breath ? ?Physical Exam: ?Vitals:  ? 11/25/21 1200 11/25/21 1230 11/25/21 1442 11/25/21 1453  ?BP: (!) 89/62 106/67 (!) 127/104 127/89  ?Pulse: 83 90 (!) 36  (!) 150  ?Resp: (!) 28 (!) 30 (!) 36 (!) 44  ?Temp:  97.7 ?F (36.5 ?C)  ?TempSrc:    Oral  ?SpO2: 96% 97% 93% 91%  ?Weight:      ?Height:      ? ?Constitutional:   ?   General: He is not in acute distress. ?HENT:  ?   Head: Normocephalic and atraumatic.  ?Cardiovascular:  ?   Rate and Rhythm: Irregularly irregular heart rate.  ?   Heart sounds: Normal heart sounds.  ?Pulmonary:  ?   Effort: Pulmonary effort is normal. Tachypnea present.  ?   Breath sounds: Decreased breath sounds present.  ?Abdominal:  ?   Palpations: Abdomen is soft.  ?   Tenderness: There is no abdominal tenderness.  ?Neurological:  ?   Mental Status: Mental status is at baseline.  ? ?Data Reviewed: ? ?CT chest suggestive of multifocal pneumonia ? ?Family Communication: None ? ?Disposition: ?Status is: Inpatient ?Remains inpatient appropriate because: Management of pneumonia and respiratory failure ? ?He is critically sick and high risk for acute cardiorespiratory failure, multiorgan failure and death.  We will monitor him in ICU/stepdown for now ? ? Planned Discharge Destination: Home with Home Health ? ? ? DVT prophylaxis-full dose Eliquis ?Time spent (critical care): 35 minutes ? ?Author: Max Sane, MD ?11/25/2021 3:05 PM ? ?For on call review [email or web address].  ?

## 2021-11-25 NOTE — ED Notes (Signed)
Pt appears very uncomfortable in bed. Pt moving around unable to get comfortable. Pt unable to verbalize his concerns or discomfort at this time. Pt instructed to try to slow their breathing down. Pt repositioned. Wife at bedside. ?

## 2021-11-25 NOTE — Progress Notes (Signed)
PHARMACIST - PHYSICIAN COMMUNICATION ? ?CONCERNING: Antibiotic IV to Oral Route Change Policy ? ?RECOMMENDATION: ?This patient is receiving azithromycin by the intravenous route.  Based on criteria approved by the Pharmacy and Therapeutics Committee, the antibiotic(s) is/are being converted to the equivalent oral dose form(s). ? ? ?DESCRIPTION: ?These criteria include: ?Patient being treated for a respiratory tract infection, urinary tract infection, cellulitis or clostridium difficile associated diarrhea if on metronidazole ?The patient is not neutropenic and does not exhibit a GI malabsorption state ?The patient is eating (either orally or via tube) and/or has been taking other orally administered medications for a least 24 hours ?The patient is improving clinically and has a Tmax < 100.5 ? ?If you have questions about this conversion, please contact the Pharmacy Department  ? ?Benita Gutter  ?11/25/21  ?  ?

## 2021-11-25 NOTE — Progress Notes (Signed)
Asked Dr. Manuella Ghazi if we needed lab work. None needed at this time he said. ?

## 2021-11-25 NOTE — Hospital Course (Signed)
81 year old male with a known history of nonobstructive coronary artery disease, persistent atrial fibrillation/flutter, recurrent ventricular tachycardia, chronic HFrEF due to nonischemic cardiomyopathy, hypertension, hyperlipidemia, stroke, and hypothyroidism admitted for multifocal pneumonia ? ?5/13: Improving on antibiotics ?

## 2021-11-25 NOTE — ED Notes (Signed)
Pt placed on male purewick 

## 2021-11-25 NOTE — Progress Notes (Addendum)
ANTICOAGULATION CONSULT NOTE - Initial Consult ? ?Pharmacy Consult for heparin infusion ?Indication: atrial fibrillation ? ?Allergies  ?Allergen Reactions  ? Ciprofloxacin Anaphylaxis  ? Levofloxacin Anaphylaxis  ?  "throat closes"  ? Enalapril Maleate Swelling  ?  Angioedema  ? Metoprolol Swelling  ?  edema  ? Sulfa Antibiotics Rash and Other (See Comments)  ?  Hypotension  ? ? ?Patient Measurements: ?Height: '5\' 6"'$  (167.6 cm) ?Weight: 74.8 kg (165 lb) ?IBW/kg (Calculated) : 63.8 ?Heparin Dosing Weight: 74.8kg ? ?Vital Signs: ?Temp: 97.7 ?F (36.5 ?C) (05/13 1453) ?Temp Source: Oral (05/13 1453) ?BP: 127/89 (05/13 1453) ?Pulse Rate: 150 (05/13 1453) ? ?Labs: ?Recent Labs  ?  11/24/21 ?1020 11/24/21 ?1920 11/25/21 ?1515 11/25/21 ?1527  ?HGB 14.4 15.8  --  15.3  ?HCT 43.7 47.5  --  46.4  ?PLT 245 289  --  324  ?CREATININE 0.90 0.94 0.90  --   ?TROPONINIHS  --   --  32*  --   ? ? ?Estimated Creatinine Clearance: 56.1 mL/min (by C-G formula based on SCr of 0.9 mg/dL). ? ? ?Medical History: ?Past Medical History:  ?Diagnosis Date  ? Arthritis   ? Benign prostatic hypertrophy   ? Bursitis of left shoulder   ? CHF (congestive heart failure) ([place])   ? Coronary artery disease 08/2019  ? Moderate multivessel CAD (not hemodynamically significant by CT-FFR)  ? Diabetes ([place])   ? Dyspnea on exertion 11/01/2011  ? Elevated PSA   ? History of colon polyps   ? History of colon polyps   ? Hyperlipidemia   ? Hypertension   ? Melanoma ([place])   ? under arm  ? Microscopic hematuria   ? Osteoarthritis   ? Retroperitoneal mass   ? Stroke [facility]) 05/2014  ? TIA (transient ischemic attack)   ? Urinary retention   ? UTI (urinary tract infection)   ? Ventricular tachycardia ([place])   ? RBB/LAHB ideopathic VT, 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  ? ? ?Medications:  ?Apixaban 5 mg BID - last dose 5/13 '@1005'$  ? ?Assessment: ?81 year old male with a known history of nonobstructive coronary artery disease, persistent atrial fibrillation/flutter, recurrent  ventricular tachycardia, chronic HFrEF due to nonischemic cardiomyopathy, hypertension, hyperlipidemia, stroke, and hypothyroidism admitted for multifocal pneumonia. Pharmacy consulted for heparin dosing/monitoring.  ? ?Baseline labs: Hgb/Hct and Plt WNL; aPTT and PT-INR pending ? ?Goal of Therapy:  ?Heparin level 0.3-0.7 units/ml ?aPTT 66-102 seconds ?Monitor platelets by anticoagulation protocol: Yes ?  ?Plan:  ?No bolus based on last dose of apixaban. Start heparin infusion at 1100 units/hr at 2200 ?Check aPTT level in 8 hours and daily while on heparin. Check heparin level with morning labs. Will switch to monitoring heparin level once correlating with aPTT. ?Continue to monitor H&H and platelets ? ?Lagretta Loseke O Kadince Boxley ?11/25/2021,4:10 PM ? ? ?

## 2021-11-25 NOTE — ED Notes (Signed)
PT to ICU with [place] RN and RT [place] ?

## 2021-11-25 NOTE — Progress Notes (Signed)
Notified Dr. Manuella Ghazi pt is now wanting to sit at edge of bed saying he feels SOB. ER charge nurse called to come assess pt. Pt's heart rate staying in the 130-140's. BP stable. Afebrile. Respirations have been 30-40 all day. Pt is a mouth-breather. Float nurse Ariel here to assist. ?

## 2021-11-25 NOTE — Progress Notes (Signed)
Pt's heart rate shot up to 130-140's for no apparent reason. Pt lying down in bed asleep. Denies any new symptoms. Dr. Manuella Ghazi notified immediately. Cardiologist included in chat. ?

## 2021-11-25 NOTE — Progress Notes (Signed)
An USGPIV (ultrasound guided PIV) has been placed for short-term vasopressor infusion. A correctly placed ivWatch must be used when administering Vasopressors. Should this treatment be needed beyond 72 hours, central line access should be obtained.  It will be the responsibility of the bedside nurse to follow best practice to prevent extravasations.   ?

## 2021-11-26 ENCOUNTER — Inpatient Hospital Stay: Payer: Medicare Other

## 2021-11-26 ENCOUNTER — Inpatient Hospital Stay (HOSPITAL_COMMUNITY)
Admit: 2021-11-26 | Discharge: 2021-11-26 | Disposition: A | Discharge status: Home / Self Care | Payer: Medicare Other | Attending: Internal Medicine | Admitting: Internal Medicine

## 2021-11-26 DIAGNOSIS — I4891 Unspecified atrial fibrillation: Secondary | ICD-10-CM

## 2021-11-26 DIAGNOSIS — J9601 Acute respiratory failure with hypoxia: Secondary | ICD-10-CM | POA: Diagnosis not present

## 2021-11-26 DIAGNOSIS — I5043 Acute on chronic combined systolic (congestive) and diastolic (congestive) heart failure: Secondary | ICD-10-CM | POA: Diagnosis not present

## 2021-11-26 LAB — ECHOCARDIOGRAM LIMITED
Area-P 1/2: 4.27 cm2
Height: 66 in
MV M vel: 4.57 m/s
MV Peak grad: 83.5 mmHg
P 1/2 time: 549 msec
Radius: 0.5 cm
S' Lateral: 4.9 cm
Weight: 2726.65 oz

## 2021-11-26 LAB — APTT
aPTT: 56 seconds — ABNORMAL HIGH (ref 24–36)
aPTT: 61 seconds — ABNORMAL HIGH (ref 24–36)
aPTT: 92 seconds — ABNORMAL HIGH (ref 24–36)

## 2021-11-26 LAB — BLOOD GAS, ARTERIAL
Acid-base deficit: 2.1 mmol/L — ABNORMAL HIGH (ref 0.0–2.0)
Bicarbonate: 19.8 mmol/L — ABNORMAL LOW (ref 20.0–28.0)
O2 Content: 4 L/min
O2 Saturation: 93.5 %
Patient temperature: 37
pCO2 arterial: 26 mmHg — ABNORMAL LOW (ref 32–48)
pH, Arterial: 7.49 — ABNORMAL HIGH (ref 7.35–7.45)
pO2, Arterial: 69 mmHg — ABNORMAL LOW (ref 83–108)

## 2021-11-26 LAB — BASIC METABOLIC PANEL
Anion gap: 15 (ref 5–15)
BUN: 26 mg/dL — ABNORMAL HIGH (ref 8–23)
CO2: 21 mmol/L — ABNORMAL LOW (ref 22–32)
Calcium: 8.4 mg/dL — ABNORMAL LOW (ref 8.9–10.3)
Chloride: 99 mmol/L (ref 98–111)
Creatinine, Ser: 1.02 mg/dL (ref 0.61–1.24)
GFR, Estimated: >60 mL/min (ref >60)
Glucose, Bld: 148 mg/dL — ABNORMAL HIGH (ref 70–99)
Potassium: 3.6 mmol/L (ref 3.5–5.1)
Sodium: 135 mmol/L (ref 135–145)

## 2021-11-26 LAB — IRON AND TIBC
Iron: 26 ug/dL — ABNORMAL LOW (ref 45–182)
Saturation Ratios: 12 % — ABNORMAL LOW (ref 17.9–39.5)
TIBC: 213 ug/dL — ABNORMAL LOW (ref 250–450)
UIBC: 187 ug/dL

## 2021-11-26 LAB — GLUCOSE, CAPILLARY
Glucose-Capillary: 150 mg/dL — ABNORMAL HIGH (ref 70–99)
Glucose-Capillary: 165 mg/dL — ABNORMAL HIGH (ref 70–99)
Glucose-Capillary: 165 mg/dL — ABNORMAL HIGH (ref 70–99)
Glucose-Capillary: 161 mg/dL — ABNORMAL HIGH (ref 70–99)
Glucose-Capillary: 259 mg/dL — ABNORMAL HIGH (ref 70–99)
Glucose-Capillary: 210 mg/dL — ABNORMAL HIGH (ref 70–99)

## 2021-11-26 LAB — PHOSPHORUS: Phosphorus: 5.1 mg/dL — ABNORMAL HIGH (ref 2.5–4.6)

## 2021-11-26 LAB — CBC
HCT: 45.5 % (ref 39.0–52.0)
Hemoglobin: 15.4 g/dL (ref 13.0–17.0)
MCH: 32 pg (ref 26.0–34.0)
MCHC: 33.8 g/dL (ref 30.0–36.0)
MCV: 94.6 fL (ref 80.0–100.0)
Platelets: 325 10*3/uL (ref 150–400)
RBC: 4.81 MIL/uL (ref 4.22–5.81)
RDW: 13.9 % (ref 11.5–15.5)
WBC: 16.7 10*3/uL — ABNORMAL HIGH (ref 4.0–10.5)
nRBC: 0 % (ref 0.0–0.2)

## 2021-11-26 LAB — HEPARIN LEVEL (UNFRACTIONATED): Heparin Unfractionated: >1.1 IU/mL — ABNORMAL HIGH (ref 0.30–0.70)

## 2021-11-26 LAB — HEPATIC FUNCTION PANEL
ALT: 478 U/L — ABNORMAL HIGH (ref 0–44)
AST: 546 U/L — ABNORMAL HIGH (ref 15–41)
Albumin: 2.6 g/dL — ABNORMAL LOW (ref 3.5–5.0)
Alkaline Phosphatase: 159 U/L — ABNORMAL HIGH (ref 38–126)
Bilirubin, Direct: 0.3 mg/dL — ABNORMAL HIGH (ref 0.0–0.2)
Indirect Bilirubin: 0.7 mg/dL (ref 0.3–0.9)
Total Bilirubin: 1 mg/dL (ref 0.3–1.2)
Total Protein: 7.2 g/dL (ref 6.5–8.1)

## 2021-11-26 LAB — FERRITIN: Ferritin: 1226 ng/mL — ABNORMAL HIGH (ref 24–336)

## 2021-11-26 LAB — HEPATITIS PANEL, ACUTE
HCV Ab: NONREACTIVE
Hep A IgM: NONREACTIVE
Hep B C IgM: NONREACTIVE
Hepatitis B Surface Ag: NONREACTIVE

## 2021-11-26 LAB — MAGNESIUM: Magnesium: 2.2 mg/dL (ref 1.7–2.4)

## 2021-11-26 MED ORDER — FUROSEMIDE 10 MG/ML IJ SOLN
80.0000 mg | Freq: Once | INTRAMUSCULAR | Status: AC
  Administered 2021-11-26: 80 mg via INTRAVENOUS
  Filled 2021-11-26: qty 8

## 2021-11-26 MED ORDER — INSULIN ASPART 100 UNIT/ML IJ SOLN
0.0000 [IU] | Freq: Three times a day (TID) | INTRAMUSCULAR | Status: DC
  Administered 2021-11-26: 3 [IU] via SUBCUTANEOUS
  Administered 2021-11-27: 1 [IU] via SUBCUTANEOUS
  Administered 2021-11-27: 5 [IU] via SUBCUTANEOUS
  Administered 2021-11-27: 1 [IU] via SUBCUTANEOUS
  Administered 2021-11-27: 3 [IU] via SUBCUTANEOUS
  Administered 2021-11-28 (×2): 2 [IU] via SUBCUTANEOUS
  Administered 2021-11-28 – 2021-11-29 (×3): 1 [IU] via SUBCUTANEOUS
  Administered 2021-11-29: 3 [IU] via SUBCUTANEOUS
  Administered 2021-11-29: 1 [IU] via SUBCUTANEOUS
  Administered 2021-11-30: 3 [IU] via SUBCUTANEOUS
  Administered 2021-11-30: 2 [IU] via SUBCUTANEOUS
  Administered 2021-11-30: 5 [IU] via SUBCUTANEOUS
  Filled 2021-11-26 (×14): qty 1

## 2021-11-26 MED ORDER — IPRATROPIUM-ALBUTEROL 0.5-2.5 (3) MG/3ML IN SOLN
3.0000 mL | Freq: Three times a day (TID) | RESPIRATORY_TRACT | Status: DC
  Administered 2021-11-26 – 2021-11-30 (×13): 3 mL via RESPIRATORY_TRACT
  Filled 2021-11-26 (×14): qty 3

## 2021-11-26 MED ORDER — HEPARIN BOLUS VIA INFUSION
1200.0000 [IU] | Freq: Once | INTRAVENOUS | Status: AC
  Administered 2021-11-26: 1200 [IU] via INTRAVENOUS
  Filled 2021-11-26: qty 1200

## 2021-11-26 MED ORDER — HEPARIN BOLUS VIA INFUSION
1100.0000 [IU] | Freq: Once | INTRAVENOUS | Status: AC
  Administered 2021-11-26: 1100 [IU] via INTRAVENOUS
  Filled 2021-11-26: qty 1100

## 2021-11-26 MED ORDER — PREDNISONE 50 MG PO TABS
60.0000 mg | ORAL_TABLET | Freq: Every day | ORAL | Status: DC
  Administered 2021-11-28 – 2021-12-01 (×4): 60 mg via ORAL
  Filled 2021-11-26: qty 6
  Filled 2021-11-26: qty 1
  Filled 2021-11-26: qty 6
  Filled 2021-11-26: qty 1

## 2021-11-26 MED ORDER — INSULIN ASPART 100 UNIT/ML IJ SOLN
0.0000 [IU] | Freq: Three times a day (TID) | INTRAMUSCULAR | Status: DC
  Administered 2021-11-26: 2 [IU] via SUBCUTANEOUS
  Filled 2021-11-26: qty 1

## 2021-11-26 NOTE — Progress Notes (Signed)
Patient was trying to pull bipap off. Took patient off and placed on 4L [place] for trial off. ?

## 2021-11-26 NOTE — Progress Notes (Signed)
ANTICOAGULATION CONSULT NOTE  ? ?Pharmacy Consult for heparin infusion ?Indication: atrial fibrillation ? ?Allergies  ?Allergen Reactions  ? Ciprofloxacin Anaphylaxis  ? Levofloxacin Anaphylaxis  ?  "throat closes"  ? Enalapril Maleate Swelling  ?  Angioedema  ? Metoprolol Swelling  ?  edema  ? Sulfa Antibiotics Rash and Other (See Comments)  ?  Hypotension  ? ? ?Patient Measurements: ?Height: '5\' 6"'$  (167.6 cm) ?Weight: 77.3 kg (170 lb 6.7 oz) ?IBW/kg (Calculated) : 63.8 ?Heparin Dosing Weight: 74.8kg ? ?Vital Signs: ?Temp: 99.4 ?F (37.4 ?C) (05/13 2358) ?Temp Source: Axillary (05/13 2358) ?BP: 123/83 (05/14 0500) ?Pulse Rate: 92 (05/14 0500) ? ?Labs: ?Recent Labs  ?  11/24/21 ?1020 11/24/21 ?1920 11/25/21 ?1515 11/25/21 ?1527 11/25/21 ?1642 11/26/21 ?0457  ?HGB 14.4 15.8  --  15.3  --  15.4  ?HCT 43.7 47.5  --  46.4  --  45.5  ?PLT 245 289  --  324  --  325  ?APTT  --   --   --   --  36 56*  ?LABPROT  --   --   --   --  22.9*  --   ?INR  --   --   --   --  2.1*  --   ?HEPARINUNFRC  --   --   --   --   --  >1.10*  ?CREATININE 0.90 0.94 0.90  --   --   --   ?TROPONINIHS  --   --  32*  --   --   --   ? ? ? ?Estimated Creatinine Clearance: 60.9 mL/min (by C-G formula based on SCr of 0.9 mg/dL). ? ? ?Medical History: ?Past Medical History:  ?Diagnosis Date  ? Arthritis   ? Benign prostatic hypertrophy   ? Bursitis of left shoulder   ? CHF (congestive heart failure) ([place])   ? Coronary artery disease 08/2019  ? Moderate multivessel CAD (not hemodynamically significant by CT-FFR)  ? Diabetes ([place])   ? Dyspnea on exertion 11/01/2011  ? Elevated PSA   ? History of colon polyps   ? History of colon polyps   ? Hyperlipidemia   ? Hypertension   ? Melanoma ([place])   ? under arm  ? Microscopic hematuria   ? Osteoarthritis   ? Retroperitoneal mass   ? Stroke [facility]) 05/2014  ? TIA (transient ischemic attack)   ? Urinary retention   ? UTI (urinary tract infection)   ? Ventricular tachycardia ([place])   ? RBB/LAHB ideopathic VT, noninducible  at EPS 03/14/11  ? ? ?Medications:  ?Apixaban 5 mg BID - last dose 5/13 '@1005'$  ? ?Assessment: ?81 year old male with a known history of nonobstructive coronary artery disease, persistent atrial fibrillation/flutter, recurrent ventricular tachycardia, chronic HFrEF due to nonischemic cardiomyopathy, hypertension, hyperlipidemia, stroke, and hypothyroidism admitted for multifocal pneumonia. Pharmacy consulted for heparin dosing/monitoring.  ? ?5/14 0457 aPTT 56 HL > 1.1  ? ?Goal of Therapy:  ?Heparin level 0.3-0.7 units/ml ?aPTT 66-102 seconds ?Monitor platelets by anticoagulation protocol: Yes ?  ?Plan:  ?aPTT is subtherapeutic. Will give a heparin bolus of 1100 units x 1 and increase heparin infusion to 1250 units/hr. Recheck aPTT in 8 hours. CBC and heparin level daily while on heparin. Switch to heparin level monitoring once aPTT and heparin level correlate.  ? ?Oswald Hillock, PHarmD,  ?11/26/2021,5:47 AM ? ? ?

## 2021-11-26 NOTE — Plan of Care (Signed)
?  Problem: Education: ?Goal: Ability to demonstrate management of disease process will improve ?Outcome: Progressing ?Goal: Ability to verbalize understanding of medication therapies will improve ?Outcome: Progressing ?Goal: Individualized Educational Video(s) ?Outcome: Progressing ?  ?Problem: Activity: ?Goal: Capacity to carry out activities will improve ?Outcome: Progressing ?  ?Problem: Cardiac: ?Goal: Ability to achieve and maintain adequate cardiopulmonary perfusion will improve ?Outcome: Progressing ?  ?Problem: Activity: ?Goal: Ability to tolerate increased activity will improve ?Outcome: Progressing ?  ?Problem: Clinical Measurements: ?Goal: Ability to maintain a body temperature in the normal range will improve ?Outcome: Progressing ?  ?Problem: Respiratory: ?Goal: Ability to maintain adequate ventilation will improve ?Outcome: Progressing ?Goal: Ability to maintain a clear airway will improve ?Outcome: Progressing ?  ?

## 2021-11-26 NOTE — Progress Notes (Signed)
? ?NAME:  Austin Warren, MRN:  [id number], DOB:  01-01-1941, LOS: 2 ?ADMISSION DATE:  11/24/2021, CONSULTATION DATE: 11/25/21 ?REFERRING MD: Dr. Manuella Ghazi, CHIEF COMPLAINT: Shortness of Breath   ? ?History of Present Illness:  ?This is an 81 yo male who presented [facility] ER on 05/12 with c/o worsening shortness of breath worse over the past 4 days.  He has had a hx of shortness of breath over the past few monthsHe has a hx of chronic systolic heart failure due to nonischemic cardiomyopathy with recent cardiac cath on 10/30/21 due to continued acute on chronic respiratory failure findings were consistent with non-ischemic cardiomyopathy with recommendations for continued gentle diuresis.  He saw his outpatient cardiologist on 05/12 CXR obtained which revealed possible pneumonia vs. pulmonary edema vs. amiodarone toxicity.  Therefore, pt instructed to proceed to the ER for further evaluation. ? ?ED Course ?Upon arrival to the ER pt placed on 2L O2 via nasal canula with O2 sats in the upper 90's.  Lab results revealed pct <0.10, Na+ 133, glucose 138, BUN 24, alk phos 165, and Flu A&B/Covid negative.  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  He received azithromycin and ceftriaxone.  He was subsequently admitted to the progressive care unit per hospitalist team, however he remained in the ER pending bed availability. ? ?Hospital Course ?On 05/13 while in the ER pt developed worsening acute on chronic hypoxic respiratory failure with increase in O2 requirements and atrial fibrillation with rvr.  Pt changed to stepdown status.  PCCM team consulted to assist with management.  Pt received 40 mg iv lasix; 5 mg of iv metoprolol; and placed on Bipap.    ? ?Pertinent  Medical History  ?Arthritis ?BPH ?Chronic Systolic CHF~EF 20 to 25% via Echo 10/19/21 ?CAD ?Type II Diabetes Mellitus  ?Elevated  PSA ?Colon Polyps ?HLD ?HTN ?Melanoma ?Microscopic Hematuria  ?Stroke (2015) ?TIA ?Urinary Retention ?Ventricular Tachycardia ? ?Significant Hospital Events: ?Including procedures, antibiotic start and stop dates in addition to other pertinent events   ?5/12: Pt admitted to the progressive care unit with acute on chronic respiratory failure secondary to multifocal pneumonia  ?5/13: Pt developed worsening acute on chronic hypoxic respiratory failure secondary to worsening multifocal pneumonia vs. pulmonary edema requiring Bipap along with atrial fibrillation with rvr.  Pt changed to stepdown status.  PCCM team consulted   ?5/14: significant improvement in respiratory status this AM; remains on low-dose peripheral levo ? ?Interim History / Subjective:  ?Respiratory status is markedly improved this AM. Was in significant distress yesterday. He ultimately responded well to 1 mg/kg/day methylpred, low dose pressors for optimization of cardiac output and diuresis. Given his dramatic improvement after steroids, my suspicion for amiodarone lung toxicity is higher although it will be very difficult to parse. The rapidity of his decline yesterday necessitated aggressive management. Amiodarone added as an allergy and Cardiology has been notified of this. ? ?LFTs are elevated this AM, possibly related to shock liver. RUQ U/S suggestive of cirrhosis. ? ?Objective   ?Blood pressure (!) 101/59, pulse 91, temperature 97.6 ?F (36.4 ?C), temperature source Oral, resp. rate (!) 28, height 5' 6"  (1.676 m), weight 77.3 kg, SpO2 96 %. ?   ?FiO2 (%):  [30 %-50 %] 30 %  ? ?Intake/Output Summary (Last 24 hours) at 11/26/2021 1323 ?Last data filed at 11/26/2021 1100 ?Gross per 24 hour  ?Intake 872.65 ml  ?Output  1400 ml  ?Net -527.35 ml  ? ?Filed Weights  ? 11/24/21 1907 11/26/21 0500  ?Weight: 74.8 kg 77.3 kg  ? ? ?Examination: ?General: elderly Caucasian male in NAD ?Lungs: bilateral inspiratory crackles, no W/C/R ?Cardiovascular: Irregular  irregular, 2+ radial/1+ distal pulses, no edema  ?Abdomen: + BS x4, soft, non tender, non distended  ?Extremities: Normal bulk and tone, moves all extremities  ?Neuro: Alert and oriented, follows commands, PERRLA  ?GU: Indwelling foley catheter draining clear yellow urine  ? ?Resolved Hospital Problem list   ?Lactic acidosis ?Hyponatremia ? 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 ?Multifactorial shock in setting of heart failure, sepsis ?- Low-dose pressors to optimize cardiac output ?- Continue diuresis ?- Abx as above ? ?Acute on chronic systolic CHF  ?Atrial fibrillation with RVR ?- Cardiology consulted appreciate input ?- Continue outpatient mexiletine ?- Holding amiodarone as above ?- Heparin gtt: dosing per pharmacy ? ?Elevated LFTs ?Shock liver vs hepatic congestion although the former is favored given the hepatocellular injury pattern without significant bilirubin elevation. ?- Obtain RUQ U/S ?- Send hepatitis panel, iron profile ? ?Type II diabetes mellitus  ?- CBG ACHS ?- SSI  ? ?Best Practice (right click and "Reselect all SmartList Selections" daily)  ? ?Diet/type: Regular consistency (see orders) ?DVT prophylaxis: systemic heparin ?GI prophylaxis: N/A ?Lines: N/A ?Foley:  Yes, and it is still needed ?Code Status:  full code ?Last date of multidisciplinary goals of care discussion [N/A] ? ?Labs   ?CBC: ?Recent Labs  ?Lab 11/24/21 ?1020 11/24/21 ?1920 11/25/21 ?1527 11/26/21 ?0457  ?WBC 9.3 10.7* 13.0* 16.7*  ?NEUTROABS  --  7.2 9.6*  --   ?HGB 14.4 15.8 15.3 15.4  ?HCT 43.7 47.5 46.4 45.5  ?MCV  95.2 95.4 96.9 94.6  ?PLT 245 289 324 325  ? ? ?Basic Metabolic Panel: ?Recent Labs  ?Lab 11/24/21 ?1020 11/24/21 ?1920 11/25/21 ?1515 11/26/21 ?0457  ?NA 133* 131* 129* 135  ?K 3.5 3.6 4.1 3.6  ?CL 99 96* 97* 99  ?CO2 26 25 19* 21*  ?GLUCOSE 138* 106* 153* 148*  ?BUN 24* 24* 24* 26*  ?CREATININE 0.90 0.94 0.90 1.02  ?CALCIUM 8.6* 8.7* 8.2* 8.4*  ?MG  --   --  2.2 2.2  ?PHOS  --   --  4.5 5.1*  ? ?GFR: ?Estimated Creatinine Clearance: 53.7 mL/min (by C-G formula based on SCr of 1.02 mg/dL). ?Recent Labs  ?Lab 11/24/21 ?1020 11/24/21 ?1920 11/25/21 ?1527 11/25/21 ?1903 11/25/21 ?2148 11/26/21 ?0457  ?PROCALCITON  --  <0.10  --   --   --   --   ?WBC 9.3 10.7* 13.0*  --   --  16.7*  ?LATICACIDVEN  --   --  3.0* 2.6* 1.5  --   ? ? ?Liver Function Tests: ?Recent Labs  ?Lab 11/24/21 ?1020 11/25/21 ?1515 11/26/21 ?0457  ?AST 27 31 546*  ?ALT 19 19 478*  ?ALKPHOS 165* 165* 159*  ?BILITOT 1.5* 1.4* 1.0  ?PROT 7.4 7.4 7.2  ?ALBUMIN 2.9* 2.7* 2.6*  ? ?No results for input(s): LIPASE, AMYLASE in the last 168 hours. ?No results for input(s): AMMONIA in the  last 168 hours. ? ?ABG ?   ?Component Value Date/Time  ? PHART 7.41 11/25/2021 1819  ? PCO2ART 32 11/25/2021 1819  ? PO2ART 74 (L) 11/25/2021 1819  ? HCO3 20.3 11/25/2021 1819  ? TCO2 26 10/30/2021 1026  ? ACIDBASEDEF 3.3 (H) 11/25/2021 1819  ? O2SAT 96.3 11/25/2021 1819  ?  ? ?Coagulation Profile: ?Recent Labs  ?Lab 11/25/21 ?1642  ?INR 2.1*  ? ? ?Cardiac Enzymes: ?No results for input(s): CKTOTAL, CKMB, CKMBINDEX, TROPONINI in the last 168 hours. ? ?HbA1C: ?Hgb A1c MFr Bld  ?Date/Time Value Ref Range Status  ?11/25/2021 03:15 PM 5.3 4.8 - 5.6 % Final  ?  Comment:  ?  (NOTE) ?Pre diabetes:          5.7%-6.4% ? ?Diabetes:              >6.4% ? ?Glycemic control for   <7.0% ?adults with diabetes ?  ?04/07/2019 03:36 PM 5.2 4.8 - 5.6 % Final  ?  Comment:  ?  (NOTE) ?        Prediabetes: 5.7 - 6.4 ?        Diabetes: >6.4 ?        Glycemic control for adults with diabetes: <7.0 ?   ? ? ?CBG: ?Recent Labs  ?Lab 11/25/21 ?1939 11/25/21 ?2337 11/26/21 ?8677 11/26/21 ?0747 11/26/21 ?1103  ?GLUCAP 123* 159* 150* 165* 165*  ? ? ?Review of Systems: Positives in BOLD   ?Pertinent findings

## 2021-11-26 NOTE — Progress Notes (Signed)
ANTICOAGULATION CONSULT NOTE  ? ?Pharmacy Consult for heparin infusion ?Indication: atrial fibrillation ? ?Allergies  ?Allergen Reactions  ? Ciprofloxacin Anaphylaxis  ? Levofloxacin Anaphylaxis  ?  "throat closes"  ? Amiodarone Other (See Comments)  ?  Pulmonary toxicity  ? Enalapril Maleate Swelling  ?  Angioedema  ? Metoprolol Swelling  ?  edema  ? Sulfa Antibiotics Rash and Other (See Comments)  ?  Hypotension  ? ? ?Patient Measurements: ?Height: '5\' 6"'$  (167.6 cm) ?Weight: 77.3 kg (170 lb 6.7 oz) ?IBW/kg (Calculated) : 63.8 ?Heparin Dosing Weight: 74.8kg ? ?Vital Signs: ?Temp: 97.4 ?F (36.3 ?C) (05/14 1400) ?Temp Source: Oral (05/14 1400) ?BP: 94/77 (05/14 1400) ?Pulse Rate: 86 (05/14 1400) ? ?Labs: ?Recent Labs  ?  11/24/21 ?1920 11/25/21 ?1515 11/25/21 ?1527 11/25/21 ?1642 11/26/21 ?0457 11/26/21 ?1401  ?HGB 15.8  --  15.3  --  15.4  --   ?HCT 47.5  --  46.4  --  45.5  --   ?PLT 289  --  324  --  325  --   ?APTT  --   --   --  36 56* 61*  ?LABPROT  --   --   --  22.9*  --   --   ?INR  --   --   --  2.1*  --   --   ?HEPARINUNFRC  --   --   --   --  >1.10*  --   ?CREATININE 0.94 0.90  --   --  1.02  --   ?TROPONINIHS  --  32*  --   --   --   --   ? ? ? ?Estimated Creatinine Clearance: 53.7 mL/min (by C-G formula based on SCr of 1.02 mg/dL). ? ? ?Medical History: ?Past Medical History:  ?Diagnosis Date  ? Arthritis   ? Benign prostatic hypertrophy   ? Bursitis of left shoulder   ? CHF (congestive heart failure) ([place])   ? Coronary artery disease 08/2019  ? Moderate multivessel CAD (not hemodynamically significant by CT-FFR)  ? Diabetes ([place])   ? Dyspnea on exertion 11/01/2011  ? Elevated PSA   ? History of colon polyps   ? History of colon polyps   ? Hyperlipidemia   ? Hypertension   ? Melanoma ([place])   ? under arm  ? Microscopic hematuria   ? Osteoarthritis   ? Retroperitoneal mass   ? Stroke [facility]) 05/2014  ? TIA (transient ischemic attack)   ? Urinary retention   ? UTI (urinary tract infection)   ? Ventricular  tachycardia ([place])   ? RBB/LAHB ideopathic VT, 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  ? ? ?Medications:  ?Apixaban 5 mg BID - last dose 5/13 '@1005'$  ? ?Assessment: ?81 year old male with a known history of nonobstructive coronary artery disease, persistent atrial fibrillation/flutter, recurrent ventricular tachycardia, chronic HFrEF due to nonischemic cardiomyopathy, hypertension, hyperlipidemia, stroke, and hypothyroidism admitted for multifocal pneumonia. Pharmacy consulted for heparin dosing/monitoring.  ? ?5/14 0457 aPTT 56/HL > 1.1  ?5/14 1401 aPTT 61   subthera,  inc from 1250 units/hr to 1400 units/hr ? ?Goal of Therapy:  ?Heparin level 0.3-0.7 units/ml ?aPTT 66-102 seconds ?Monitor platelets by anticoagulation protocol: Yes ?  ?Plan:  ?aPTT is subtherapeutic.  ?Will give a heparin bolus of 1200 units x 1 and increase heparin infusion to 1400 units/hr.  ?Recheck aPTT in 8 hours.  ?CBC and heparin level daily while on heparin. Switch to heparin level monitoring once aPTT and heparin level correlate.  ? ?  Samoria Fedorko A, PharmD,  ?11/26/2021,2:47 PM ? ? ?

## 2021-11-26 NOTE — Consult Note (Signed)
?Electrophysiology Consultation:  ? ?Patient ID: Austin Warren ?MRN: [id number]; DOB: 03/30/1941 ? ?Admit date: 11/24/2021 ?Date of Consult: 11/26/2021 ? ?PCP:  Kirk Ruths, MD ?  ?[facility] Providers ?Cardiologist:  Nelva Bush, MD    ? ? ?Patient Profile:  ? ?Austin Warren is a 81 y.o. male with a hx of chronic systolic heart failure, NICM, DM, HLD, HTN, stroke who is being seen 11/26/2021 for the evaluation of AF at the request of Dr Manuella Ghazi. ? ?History of Present Illness:  ? ?[name] presented to the hospital 11/25/2021 with acute on chronic hypoxic respiratory failure and AF w/ RVR. His respiratory status continued to worsen and he was transferred to the ICU. CTA chest was performed which showed bilateral lung abnormalities read as PNA vs edema. He was started on abx, diuresed and started on supplemental oxygen. ? ? ?Past Medical History:  ?Diagnosis Date  ? Arthritis   ? Benign prostatic hypertrophy   ? Bursitis of left shoulder   ? CHF (congestive heart failure) ([place])   ? Coronary artery disease 08/2019  ? Moderate multivessel CAD (not hemodynamically significant by CT-FFR)  ? Diabetes ([place])   ? Dyspnea on exertion 11/01/2011  ? Elevated PSA   ? History of colon polyps   ? History of colon polyps   ? Hyperlipidemia   ? Hypertension   ? Melanoma ([place])   ? under arm  ? Microscopic hematuria   ? Osteoarthritis   ? Retroperitoneal mass   ? Stroke [facility]) 05/2014  ? TIA (transient ischemic attack)   ? Urinary retention   ? UTI (urinary tract infection)   ? Ventricular tachycardia ([place])   ? RBB/LAHB ideopathic VT, 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  ? ? ?Past Surgical History:  ?Procedure Laterality Date  ? CARDIOVERSION    ? CARDIOVERSION N/A 03/07/2018  ? Procedure: CARDIOVERSION;  Surgeon: Wellington Hampshire, MD;  Location: [facility] ORS;  Service: Cardiovascular;  Laterality: N/A;  ? CARDIOVERSION N/A 04/06/2019  ? Procedure: CARDIOVERSION;  Surgeon: Wellington Hampshire, MD;  Location: [facility] ORS;  Service:  Cardiovascular;  Laterality: N/A;  ? CARDIOVERSION N/A 06/02/2019  ? Procedure: CARDIOVERSION;  Surgeon: Nelva Bush, MD;  Location: [facility] ORS;  Service: Cardiovascular;  Laterality: N/A;  ? CARDIOVERSION N/A 07/25/2021  ? Procedure: CARDIOVERSION;  Surgeon: Nelva Bush, MD;  Location: [facility] ORS;  Service: Cardiovascular;  Laterality: N/A;  ? COLONOSCOPY WITH PROPOFOL N/A 10/17/2015  ? Procedure: COLONOSCOPY WITH PROPOFOL;  Surgeon: Manya Silvas, MD;  Location: [facility] ENDOSCOPY;  Service: Endoscopy;  Laterality: N/A;  ? COLONOSCOPY WITH PROPOFOL N/A 03/17/2019  ? Procedure: COLONOSCOPY WITH PROPOFOL;  Surgeon: Toledo, Benay Pike, MD;  Location: [facility] ENDOSCOPY;  Service: Gastroenterology;  Laterality: N/A;  ? ELECTROPHYSIOLOGIC STUDY N/A 12/07/2015  ? Procedure: V Tach Ablation;  Surgeon: Will Vila Leeds, MD;  Location: [place] CV LAB;  Service: Cardiovascular;  Laterality: N/A;  ? JOINT REPLACEMENT    ? R TKR  ? RADIOLOGY WITH ANESTHESIA N/A 05/18/2014  ? Procedure: RADIOLOGY WITH ANESTHESIA;  Surgeon: Rob Hickman, MD;  Location: [place];  Service: Radiology;  Laterality: N/A;  ? REPLACEMENT TOTAL KNEE    ? REPLACEMENT TOTAL KNEE Left   ? RIGHT/LEFT HEART CATH AND CORONARY ANGIOGRAPHY N/A 10/30/2021  ? Procedure: RIGHT/LEFT HEART CATH AND CORONARY ANGIOGRAPHY;  Surgeon: Nelva Bush, MD;  Location: [place] CV LAB;  Service: Cardiovascular;  Laterality: N/A;  ? VASECTOMY    ?  ? ? ? ?Inpatient Medications: ?  Scheduled Meds: ? atorvastatin  10 mg Oral Daily  ? Chlorhexidine Gluconate Cloth  6 each Topical Daily  ? insulin aspart  0-9 Units Subcutaneous Q4H  ? ipratropium-albuterol  3 mL Nebulization TID  ? levothyroxine  100 mcg Oral QAC breakfast  ? methylPREDNISolone (SOLU-MEDROL) injection  40 mg Intravenous Q12H  ? mexiletine  150 mg Oral BID  ? [START ON 11/28/2021] predniSONE  60 mg Oral Q breakfast  ? ?Continuous Infusions: ? sodium chloride Stopped (11/26/21 0456)  ? azithromycin Stopped  (11/25/21 2226)  ? cefTRIAXone (ROCEPHIN)  IV Stopped (11/25/21 2304)  ? heparin 1,250 Units/hr (11/26/21 1100)  ? norepinephrine (LEVOPHED) Adult infusion 2 mcg/min (11/26/21 1100)  ? ?PRN Meds: ?acetaminophen **OR** acetaminophen, ipratropium-albuterol, morphine injection, ondansetron **OR** ondansetron (ZOFRAN) IV ? ?Allergies:    ?Allergies  ?Allergen Reactions  ? Ciprofloxacin Anaphylaxis  ? Levofloxacin Anaphylaxis  ?  "throat closes"  ? Amiodarone Other (See Comments)  ?  Pulmonary toxicity  ? Enalapril Maleate Swelling  ?  Angioedema  ? Metoprolol Swelling  ?  edema  ? Sulfa Antibiotics Rash and Other (See Comments)  ?  Hypotension  ? ? ?Social History:   ?Social History  ? ?Socioeconomic History  ? Marital status: Married  ?  Spouse name: Not on file  ? Number of children: 2  ? Years of education: BS  ? Highest education level: Not on file  ?Occupational History  ? Not on file  ?Tobacco Use  ? Smoking status: Former  ?  Packs/day: 2.00  ?  Years: 10.00  ?  Pack years: 20.00  ?  Types: Cigarettes  ?  Start date: 02/10/1960  ?  Quit date: 02/09/1970  ?  Years since quitting: 51.8  ? Smokeless tobacco: Never  ? Tobacco comments:  ?  05/2014    quit smoking  45 years ago  ?Vaping Use  ? Vaping Use: Never used  ?Substance and Sexual Activity  ? Alcohol use: Yes  ?  Comment: 2 beers a few days per week  ? Drug use: No  ? Sexual activity: Not on file  ?Other Topics Concern  ? Not on file  ?Social History Narrative  ? Patient is married with 1 living and 1 deceased child.  ? Patient is right handed.  ? Patient has BS degree.  ? Patient 1 cup daily.  ? ?Social Determinants of Health  ? ?Financial Resource Strain: Not on file  ?Food Insecurity: Not on file  ?Transportation Needs: Not on file  ?Physical Activity: Not on file  ?Stress: Not on file  ?Social Connections: Not on file  ?Intimate Partner Violence: Not on file  ?  ?Family History:   ? ?Family History  ?Problem Relation Age of Onset  ? Stroke Father   ?  Hypertension Father   ? Heart attack Neg Hx   ? Kidney disease Neg Hx   ? Prostate cancer Neg Hx   ? Kidney cancer Neg Hx   ? Bladder Cancer Neg Hx   ?  ? ?ROS:  ?Please see the history of present illness.  ? ?All other ROS reviewed and negative.    ? ?Physical Exam/Data:  ? ?Vitals:  ? 11/26/21 0817 11/26/21 0900 11/26/21 1000 11/26/21 1100  ?BP: (!) 105/51 (!) 110/53 114/72 (!) 101/59  ?Pulse: 74 73 87 91  ?Resp: 19 19 (!) 27 (!) 28  ?Temp:      ?TempSrc:      ?SpO2: 93% 95% 95% 96%  ?Weight:      ?  Height:      ? ? ?Intake/Output Summary (Last 24 hours) at 11/26/2021 1145 ?Last data filed at 11/26/2021 1100 ?Gross per 24 hour  ?Intake 872.65 ml  ?Output 1400 ml  ?Net -527.35 ml  ? ? ?  11/26/2021  ?  5:00 AM 11/24/2021  ?  7:07 PM 11/24/2021  ?  8:58 AM  ?Last 3 Weights  ?Weight (lbs) 170 lb 6.7 oz 165 lb 170 lb  ?Weight (kg) 77.3 kg 74.844 kg 77.111 kg  ?   ?Body mass index is 27.51 kg/m?.  ? ?General:  Elderly, chronically ill appearing. Mild IWOB. ?HEENT: normal ?Neck: JVD to mid neck at 45 degree ?Vascular: No carotid bruits; Distal pulses 2+ bilaterally ?Cardiac:  irregularly irregular. Warm extremities. Rate OK. ?Lungs:  mild IWOB. Crackles at bilateral bases. Upper fields are clear. ?Abd: soft, nontender, no hepatomegaly  ?Ext: trace bilateral edema to shins ?Musculoskeletal:  No deformities, BUE and BLE strength normal and equal ?Skin: warm and dry  ?Neuro:  CNs 2-12 intact, no focal abnormalities noted ?Psych:  Normal affect  ? ?EKG:  The EKG was personally reviewed and demonstrates:  atrial flutter with variable AV conduction. ? ?Telemetry:  Telemetry was personally reviewed and demonstrates:  atrial flutter with variable av conduction. V rates consistently 65-90. ? ?Relevant CV Studies: ? ?11/26/2021 limited echo (formal read pending) ?EF 15-20%, global hypokinesis. ?At least mild AI ?At least mild MR ?No Peff ? ? ?10/30/2021 LHC ?Mild plaquing of LCx and RCA with up to 20% narrowing.  No significant stenosis  observed to explain reduced LVEF.  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 ?Mildly elevated left heart, right heart, and pulmonary artery pressures. ?Mildly reduced Fick cardiac output/index.

## 2021-11-26 NOTE — Progress Notes (Signed)
?  Echocardiogram ?2D Echocardiogram limited has been performed. ? Austin Warren ?11/26/2021, 8:06 AM ?

## 2021-11-27 DIAGNOSIS — J9601 Acute respiratory failure with hypoxia: Secondary | ICD-10-CM | POA: Diagnosis not present

## 2021-11-27 DIAGNOSIS — I5022 Chronic systolic (congestive) heart failure: Secondary | ICD-10-CM | POA: Diagnosis not present

## 2021-11-27 LAB — BASIC METABOLIC PANEL
Anion gap: 9 (ref 5–15)
BUN: 30 mg/dL — ABNORMAL HIGH (ref 8–23)
CO2: 24 mmol/L (ref 22–32)
Calcium: 7.8 mg/dL — ABNORMAL LOW (ref 8.9–10.3)
Chloride: 100 mmol/L (ref 98–111)
Creatinine, Ser: 0.87 mg/dL (ref 0.61–1.24)
GFR, Estimated: >60 mL/min (ref >60)
Glucose, Bld: 142 mg/dL — ABNORMAL HIGH (ref 70–99)
Potassium: 3.1 mmol/L — ABNORMAL LOW (ref 3.5–5.1)
Sodium: 133 mmol/L — ABNORMAL LOW (ref 135–145)

## 2021-11-27 LAB — THYROID PANEL WITH TSH
Free Thyroxine Index: 2.2 (ref 1.2–4.9)
T3 Uptake Ratio: 35 % (ref 24–39)
T4, Total: 6.4 ug/dL (ref 4.5–12.0)
TSH: 4.36 u[IU]/mL (ref 0.450–4.500)

## 2021-11-27 LAB — CBC
HCT: 41.5 % (ref 39.0–52.0)
Hemoglobin: 14 g/dL (ref 13.0–17.0)
MCH: 32.2 pg (ref 26.0–34.0)
MCHC: 33.7 g/dL (ref 30.0–36.0)
MCV: 95.4 fL (ref 80.0–100.0)
Platelets: 269 10*3/uL (ref 150–400)
RBC: 4.35 MIL/uL (ref 4.22–5.81)
RDW: 13.7 % (ref 11.5–15.5)
WBC: 18 10*3/uL — ABNORMAL HIGH (ref 4.0–10.5)
nRBC: 0 % (ref 0.0–0.2)

## 2021-11-27 LAB — GLUCOSE, CAPILLARY
Glucose-Capillary: 180 mg/dL — ABNORMAL HIGH (ref 70–99)
Glucose-Capillary: 130 mg/dL — ABNORMAL HIGH (ref 70–99)
Glucose-Capillary: 265 mg/dL — ABNORMAL HIGH (ref 70–99)
Glucose-Capillary: 223 mg/dL — ABNORMAL HIGH (ref 70–99)
Glucose-Capillary: 144 mg/dL — ABNORMAL HIGH (ref 70–99)

## 2021-11-27 LAB — PHOSPHORUS: Phosphorus: 3 mg/dL (ref 2.5–4.6)

## 2021-11-27 LAB — HEPARIN LEVEL (UNFRACTIONATED): Heparin Unfractionated: 1.07 IU/mL — ABNORMAL HIGH (ref 0.30–0.70)

## 2021-11-27 LAB — MAGNESIUM: Magnesium: 2.1 mg/dL (ref 1.7–2.4)

## 2021-11-27 MED ORDER — FAMOTIDINE 20 MG PO TABS
20.0000 mg | ORAL_TABLET | Freq: Two times a day (BID) | ORAL | Status: DC
  Administered 2021-11-27 – 2021-12-01 (×9): 20 mg via ORAL
  Filled 2021-11-27 (×10): qty 1

## 2021-11-27 MED ORDER — POTASSIUM CHLORIDE CRYS ER 20 MEQ PO TBCR
40.0000 meq | EXTENDED_RELEASE_TABLET | Freq: Once | ORAL | Status: AC
  Administered 2021-11-27: 40 meq via ORAL
  Filled 2021-11-27: qty 2

## 2021-11-27 MED ORDER — POTASSIUM CHLORIDE 10 MEQ/100ML IV SOLN
10.0000 meq | INTRAVENOUS | Status: DC
  Administered 2021-11-27: 10 meq via INTRAVENOUS
  Filled 2021-11-27 (×4): qty 100

## 2021-11-27 MED ORDER — APIXABAN 5 MG PO TABS
5.0000 mg | ORAL_TABLET | Freq: Two times a day (BID) | ORAL | Status: DC
  Administered 2021-11-27 – 2021-12-01 (×9): 5 mg via ORAL
  Filled 2021-11-27 (×9): qty 1

## 2021-11-27 MED ORDER — ALPRAZOLAM 0.25 MG PO TABS
0.2500 mg | ORAL_TABLET | Freq: Once | ORAL | Status: AC
  Administered 2021-11-27: 0.25 mg via ORAL
  Filled 2021-11-27: qty 1

## 2021-11-27 MED ORDER — METHYLPREDNISOLONE SODIUM SUCC 40 MG IJ SOLR
40.0000 mg | Freq: Once | INTRAMUSCULAR | Status: AC
  Administered 2021-11-27: 40 mg via INTRAVENOUS
  Filled 2021-11-27: qty 1

## 2021-11-27 MED ORDER — FUROSEMIDE 10 MG/ML IJ SOLN
40.0000 mg | Freq: Once | INTRAMUSCULAR | Status: AC
  Administered 2021-11-27: 40 mg via INTRAVENOUS
  Filled 2021-11-27: qty 4

## 2021-11-27 NOTE — Progress Notes (Signed)
? ?NAME:  Austin Warren, MRN:  [id number], DOB:  October 20, 1940, LOS: 3 ?ADMISSION DATE:  11/24/2021, CONSULTATION DATE: 11/25/21 ?REFERRING MD: Dr. Manuella Ghazi, CHIEF COMPLAINT: Shortness of Breath   ? ?History of Present Illness:  ?This is an 80 yo male who presented to [facility] ER on 05/12 with c/o worsening shortness of breath worse over the past 4 days.  He has had a hx of shortness of breath over the past few monthsHe has a hx of chronic systolic heart failure due to nonischemic cardiomyopathy with recent cardiac cath on 10/30/21 due to continued acute on chronic respiratory failure findings were consistent with non-ischemic cardiomyopathy with recommendations for continued gentle diuresis.  He saw his outpatient cardiologist on 05/12 CXR obtained which revealed possible pneumonia vs. pulmonary edema vs. amiodarone toxicity.  Therefore, pt instructed to proceed to the ER for further evaluation. ? ?ED Course ?Upon arrival to the ER pt placed on 2L O2 via nasal canula with O2 sats in the upper 90's.  Lab results revealed pct <0.10, Na+ 133, glucose 138, BUN 24, alk phos 165, and Flu A&B/Covid negative.  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  He received azithromycin and ceftriaxone.  He was subsequently admitted to the progressive care unit per hospitalist team, however he remained in the ER pending bed availability. ? ?Hospital Course ?On 05/13 while in the ER pt developed worsening acute on chronic hypoxic respiratory failure with increase in O2 requirements and atrial fibrillation with rvr.  Pt changed to stepdown status.  PCCM team consulted to assist with management.  Pt received 40 mg iv lasix; 5 mg of iv metoprolol; and placed on Bipap.    ? ?Pertinent  Medical History  ?Arthritis ?BPH ?Chronic Systolic CHF~EF 20 to 97% via Echo 10/19/21 ?CAD ?Type II Diabetes Mellitus  ?Elevated  PSA ?Colon Polyps ?HLD ?HTN ?Melanoma ?Microscopic Hematuria  ?Stroke (2015) ?TIA ?Urinary Retention ?Ventricular Tachycardia ? ?Significant Hospital Events: ?Including procedures, antibiotic start and stop dates in addition to other pertinent events   ?5/12: Pt admitted to the progressive care unit with acute on chronic respiratory failure secondary to multifocal pneumonia  ?5/13: Pt developed worsening acute on chronic hypoxic respiratory failure secondary to worsening multifocal pneumonia vs. pulmonary edema requiring Bipap along with atrial fibrillation with rvr.  Pt changed to stepdown status.  PCCM team consulted   ?5/14: significant improvement in respiratory status this AM; remains on low-dose peripheral levo ?5/15 alert and awake, less SOB, on pressors ? 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 ? ?Objective   ?Blood pressure 119/64, pulse 74, temperature 97.8 ?F (36.6 ?C), temperature source Axillary, resp. rate (!) 22, height 5' 6"  (1.676 m), weight 75.2 kg, SpO2 94 %. ?   ?   ? ?Intake/Output Summary (Last 24 hours) at 11/27/2021 0754 ?Last data filed at 11/27/2021 0700 ?Gross per 24 hour  ?Intake 1228.71 ml  ?Output 2900 ml  ?Net -1671.29 ml  ? ? ?Filed Weights  ? 11/24/21 1907 11/26/21 0500 11/27/21 0500  ?Weight: 74.8 kg 77.3 kg 75.2 kg  ? ? ? ? ?Review of Systems: ?Gen:  Denies  fever, sweats, chills weight loss  ?HEENT: Denies blurred vision, double vision, ear pain, eye pain, hearing loss, nose bleeds, sore throat ?Cardiac:  No dizziness, chest  pain or heaviness, chest tightness,edema, No JVD ?Resp:   No cough, -sputum production, -shortness of breath,-wheezing, -hemoptysis,  ?Other:  All other systems negative ? ? ?Physical Examination:  ? ?General Appearance: No distress  ?EYES PERRLA, EOM intact.   ?NECK Supple, No JVD ?Pulmonary: normal breath sounds, No  wheezing.  ?CardiovascularNormal S1,S2.  No m/r/g.   ?Abdomen: Benign, Soft, non-tender. ?Skin:   warm, no rashes, no ecchymosis  ?Extremities: normal, no cyanosis, clubbing. ?Neuro:without focal findings,  speech normal  ?PSYCHIATRIC: Mood, affect within normal limits. ? ? ? 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 ?Severe ACUTE Hypoxic and Hypercapnic Respiratory Failure ?FiO2 as tolerated, maintain SpO2 > 88% ?- Head of bed elevated 30 degrees ?- Intermittent chest x-ray & ABG PRN ?- Ensure adequate pulmonary hygiene  ? ?shock ?SOURCE-sepsis, CHF ?-use vasopressors to keep MAP>65 as needed ?-follow ABG and LA as needed ?-follow up cultures ?-emperic ABX ?-consider stress dose steroids ?Patient states that SBP is usually around 90 ?LA normalized to 1.5 ? ?ACUTE SYSTOLIC CARDIAC FAILURE- Atrial fibrillation with RVR ?- Holding amiodarone as above ?- Heparin gtt: dosing per pharmacy ?-oxygen as needed ?-Lasix as tolerated ?-follow up cardiac enzymes as indicated ?-follow up cardiology recs ? ? ?ENDO ?- ICU hypoglycemic\Hyperglycemia protocol ?-check FSBS per protocol ? ? ?GI ?GI PROPHYLAXIS as indicated ? ?NUTRITIONAL STATUS ?DIET-->as tolerated ?Constipation protocol as indicated ? ? ?ELECTROLYTES ?-follow labs as needed ?-replace as needed ?-pharmacy consultation and following ? ? ?Best Practice (right click and "Reselect all SmartList Selections" daily)  ? ?Diet/type: Regular consistency (see orders) ?DVT prophylaxis: systemic heparin ?GI prophylaxis: N/A ?Lines: N/A ?Foley:  Yes, and it is still needed ?Code Status:  full code ? ? ?Labs   ?CBC: ?Recent Labs  ?Lab 11/24/21 ?1020 11/24/21 ?1920 11/25/21 ?1527 11/26/21 ?0457 11/27/21 ?0436  ?WBC 9.3 10.7* 13.0* 16.7* 18.0*  ?NEUTROABS  --  7.2 9.6*  --   --   ?HGB 14.4 15.8 15.3 15.4 14.0  ?HCT 43.7 47.5 46.4  45.5 41.5  ?MCV 95.2 95.4 96.9 94.6 95.4  ?PLT 245 289 324 325 269  ? ? ? ?Basic Metabolic Panel: ?Recent Labs  ?Lab 11/24/21 ?1020 11/24/21 ?1920 11/25/21 ?1515 11/26/21 ?0457 11/27/21 ?0436  ?NA 133* 131* 129* 135 133*  ?K 3.5 3.6 4.1 3.6 3.1*  ?CL 99 96* 97* 99 100  ?CO2 26 25 19* 21* 24  ?GLUCOSE 138* 106* 153* 148* 142*  ?BUN 24* 24* 24* 26* 30*  ?CREATININE 0.90 0.94 0.90 1.02 0.87  ?CALCIUM 8.6* 8.7* 8.2* 8.4* 7.8*  ?MG  --   --  2.2 2.2 2.1  ?PHOS  --   --  4.5 5.1* 3.0  ? ? ?GFR: ?Estimated Creatinine Clearance: 58.1 mL/min (by C-G formula based on SCr of 0.87 mg/dL). ?Recent Labs  ?Lab 11/24/21 ?1920 11/25/21 ?1527 11/25/21 ?1903 11/25/21 ?2148 11/26/21 ?2542 11/27/21 ?0436  ?PROCALCITON <0.10  --   --   --   --   --   ?WBC 10.7* 13.0*  --   --  16.7* 18.0*  ?LATICACIDVEN  --  3.0* 2.6* 1.5  --   --   ? ? ? ?Liver Function Tests: ?Recent Labs  ?Lab 11/24/21 ?1020 11/25/21 ?1515 11/26/21 ?0457  ?AST 27 31 546*  ?ALT 19 19 478*  ?ALKPHOS 165* 165* 159*  ?BILITOT 1.5* 1.4* 1.0  ?PROT 7.4 7.4 7.2  ?  ALBUMIN 2.9* 2.7* 2.6*  ? ? ?No results for input(s): LIPASE, AMYLASE in the last 168 hours. ?No results for input(s): AMMONIA in the last 168 hours. ? ?ABG ?   ?Component Value Date/Time  ? PHART 7.41 11/25/2021 1819  ? PCO2ART 32 11/25/2021 1819  ? PO2ART 74 (L) 11/25/2021 1819  ? HCO3 20.3 11/25/2021 1819  ? TCO2 26 10/30/2021 1026  ? ACIDBASEDEF 3.3 (H) 11/25/2021 1819  ? O2SAT 96.3 11/25/2021 1819  ? ?  ? ?Coagulation Profile: ?Recent Labs  ?Lab 11/25/21 ?1642  ?INR 2.1*  ? ? ? ?Cardiac Enzymes: ?No results for input(s): CKTOTAL, CKMB, CKMBINDEX, TROPONINI in the last 168 hours. ? ?HbA1C: ?Hgb A1c MFr Bld  ?Date/Time Value Ref Range Status  ?11/25/2021 03:15 PM 5.3 4.8 - 5.6 % Final  ?  Comment:  ?  (NOTE) ?Pre diabetes:          5.7%-6.4% ? ?Diabetes:              >6.4% ? ?Glycemic control for   <7.0% ?adults with diabetes ?  ?04/07/2019 03:36 PM 5.2 4.8 - 5.6 % Final  ?  Comment:  ?  (NOTE) ?         Prediabetes: 5.7 - 6.4 ?        Diabetes: >6.4 ?        Glycemic control for adults with diabetes: <7.0 ?  ? ? ?CBG: ?Recent Labs  ?Lab 11/26/21 ?1103 11/26/21 ?1550 11/26/21 ?1941 11/26/21 ?2142 11/27/21 ?0740

## 2021-11-27 NOTE — Progress Notes (Signed)
ANTICOAGULATION CONSULT NOTE  ? ?Pharmacy Consult for heparin infusion ?Indication: atrial fibrillation ? ?Allergies  ?Allergen Reactions  ? Ciprofloxacin Anaphylaxis  ? Levofloxacin Anaphylaxis  ?  "throat closes"  ? Amiodarone Other (See Comments)  ?  Pulmonary toxicity  ? Enalapril Maleate Swelling  ?  Angioedema  ? Metoprolol Swelling  ?  edema  ? Sulfa Antibiotics Rash and Other (See Comments)  ?  Hypotension  ? ? ?Patient Measurements: ?Height: '5\' 6"'$  (167.6 cm) ?Weight: 77.3 kg (170 lb 6.7 oz) ?IBW/kg (Calculated) : 63.8 ?Heparin Dosing Weight: 74.8kg ? ?Vital Signs: ?Temp: 97.5 ?F (36.4 ?C) (05/14 2130) ?Temp Source: Axillary (05/14 2130) ?BP: 111/60 (05/14 2330) ?Pulse Rate: 77 (05/14 2330) ? ?Labs: ?Recent Labs  ?  11/24/21 ?1920 11/25/21 ?1515 11/25/21 ?1527 11/25/21 ?1642 11/25/21 ?1642 11/26/21 ?0457 11/26/21 ?1401 11/26/21 ?2250  ?HGB 15.8  --  15.3  --   --  15.4  --   --   ?HCT 47.5  --  46.4  --   --  45.5  --   --   ?PLT 289  --  324  --   --  325  --   --   ?APTT  --   --   --  36   < > 56* 61* 92*  ?LABPROT  --   --   --  22.9*  --   --   --   --   ?INR  --   --   --  2.1*  --   --   --   --   ?HEPARINUNFRC  --   --   --   --   --  >1.10*  --   --   ?CREATININE 0.94 0.90  --   --   --  1.02  --   --   ?TROPONINIHS  --  32*  --   --   --   --   --   --   ? < > = values in this interval not displayed.  ? ? ? ?Estimated Creatinine Clearance: 53.7 mL/min (by C-G formula based on SCr of 1.02 mg/dL). ? ? ?Medical History: ?Past Medical History:  ?Diagnosis Date  ? Arthritis   ? Benign prostatic hypertrophy   ? Bursitis of left shoulder   ? CHF (congestive heart failure) ([place])   ? Coronary artery disease 08/2019  ? Moderate multivessel CAD (not hemodynamically significant by CT-FFR)  ? Diabetes ([place])   ? Dyspnea on exertion 11/01/2011  ? Elevated PSA   ? History of colon polyps   ? History of colon polyps   ? Hyperlipidemia   ? Hypertension   ? Melanoma ([place])   ? under arm  ? Microscopic hematuria   ?  Osteoarthritis   ? Retroperitoneal mass   ? Stroke [facility]) 05/2014  ? TIA (transient ischemic attack)   ? Urinary retention   ? UTI (urinary tract infection)   ? Ventricular tachycardia ([place])   ? RBB/LAHB ideopathic VT, 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  ? ? ?Medications:  ?Apixaban 5 mg BID - last dose 5/13 '@1005'$  ? ?Assessment: ?81 year old male with a known history of nonobstructive coronary artery disease, persistent atrial fibrillation/flutter, recurrent ventricular tachycardia, chronic HFrEF due to nonischemic cardiomyopathy, hypertension, hyperlipidemia, stroke, and hypothyroidism admitted for multifocal pneumonia. Pharmacy consulted for heparin dosing/monitoring.  ? ?5/14 0457 aPTT 56/HL > 1.1  ?5/14 1401 aPTT 61   subthera,  inc from 1250 units/hr to 1400 units/hr ?5/14 2250 aPTT  92  ? ?Goal of Therapy:  ?Heparin level 0.3-0.7 units/ml ?aPTT 66-102 seconds ?Monitor platelets by anticoagulation protocol: Yes ?  ?Plan:  ?aPTT is therapeutic. Will continue heparin infusion at 1400 units/hr. Recheck aPTT is 8 hours. CBC and heparin level daily while on heparin. Switch to heparin level monitoring once aPTT and heparin level correlate.  ? ?Oswald Hillock, PharmD, BCPS ?11/27/2021,12:00 AM ? ? ?

## 2021-11-27 NOTE — Progress Notes (Signed)
? ?Progress Note ? ?Patient Name: Austin Warren ?Date of Encounter: 11/27/2021 ? ?[facility] Cardiologist: Nelva Bush, MD  ? ?Subjective  ? ?Patient seen this morning. Sitting up in bed eating breakfast without difficulty. Reports no chest pain or shortness of breath. Endorses a non-productive cough. Remains on Heparin drip for atrial fibrillation, currently that is rate controlled 70-100. Remains on levophed drip with stable blood pressures. Weight is down from 77.3 kg to 75.2 kg. Creatinine remains stable. Hypokalemia this morning on labs that is currently being repleted.  ? ?Inpatient Medications  ?  ?Scheduled Meds: ? atorvastatin  10 mg Oral Daily  ? Chlorhexidine Gluconate Cloth  6 each Topical Daily  ? insulin aspart  0-9 Units Subcutaneous TID AC & HS  ? ipratropium-albuterol  3 mL Nebulization TID  ? levothyroxine  100 mcg Oral QAC breakfast  ? mexiletine  150 mg Oral BID  ? potassium chloride  40 mEq Oral Once  ? [START ON 11/28/2021] predniSONE  60 mg Oral Q breakfast  ? ?Continuous Infusions: ? sodium chloride Stopped (11/26/21 0456)  ? azithromycin Stopped (11/26/21 2344)  ? cefTRIAXone (ROCEPHIN)  IV Stopped (11/26/21 2225)  ? heparin 1,400 Units/hr (11/27/21 0700)  ? norepinephrine (LEVOPHED) Adult infusion 2 mcg/min (11/27/21 0700)  ? ?PRN Meds: ?acetaminophen **OR** acetaminophen, ipratropium-albuterol, morphine injection, ondansetron **OR** ondansetron (ZOFRAN) IV  ? ?Vital Signs  ?  ?Vitals:  ? 11/27/21 0530 11/27/21 0630 11/27/21 0700 11/27/21 0826  ?BP: 111/65 (!) 114/59 119/64   ?Pulse: 76 76 74   ?Resp: (!) 25 18 (!) 22   ?Temp:      ?TempSrc:      ?SpO2: 93% 92% 94% 94%  ?Weight:      ?Height:      ? ? ?Intake/Output Summary (Last 24 hours) at 11/27/2021 0956 ?Last data filed at 11/27/2021 0700 ?Gross per 24 hour  ?Intake 1228.71 ml  ?Output 2900 ml  ?Net -1671.29 ml  ? ? ?  11/27/2021  ?  5:00 AM 11/26/2021  ?  5:00 AM 11/24/2021  ?  7:07 PM  ?Last 3 Weights  ?Weight (lbs) 165 lb 12.6  oz 170 lb 6.7 oz 165 lb  ?Weight (kg) 75.2 kg 77.3 kg 74.844 kg  ?   ? ?Telemetry  ?  ?Atrial fibrillation with aberrancies that is rate controlled  - Personally Reviewed ? ?ECG  ?  ?No recent studies ? ?Physical Exam  ? ?GEN: No acute distress. Sitting upright in bed eating breakfast. ?Neck: No JVD ?Cardiac: irregularly irregular rate and rhyum, no murmurs, rubs, or gallops.  ?Respiratory: Clear to upper lobes and diminished to the bases auscultation bilaterally. ?GI: Soft, nontender, non-distended bowels sounds + x 4 quadrants ?MS: No edema; No deformity. ?Neuro:  Nonfocal  ?Psych: Normal affect  ? ?Labs  ?  ?High Sensitivity Troponin:   ?Recent Labs  ?Lab 11/25/21 ?1515  ?TROPONINIHS 32*  ?   ?Chemistry ?Recent Labs  ?Lab 11/24/21 ?1020 11/24/21 ?1920 11/25/21 ?1515 11/26/21 ?0457 11/27/21 ?0436  ?NA 133*   < > 129* 135 133*  ?K 3.5   < > 4.1 3.6 3.1*  ?CL 99   < > 97* 99 100  ?CO2 26   < > 19* 21* 24  ?GLUCOSE 138*   < > 153* 148* 142*  ?BUN 24*   < > 24* 26* 30*  ?CREATININE 0.90   < > 0.90 1.02 0.87  ?CALCIUM 8.6*   < > 8.2* 8.4* 7.8*  ?MG  --   --  2.2 2.2 2.1  ?PROT 7.4  --  7.4 7.2  --   ?ALBUMIN 2.9*  --  2.7* 2.6*  --   ?AST 27  --  31 546*  --   ?ALT 19  --  19 478*  --   ?ALKPHOS 165*  --  165* 159*  --   ?BILITOT 1.5*  --  1.4* 1.0  --   ?GFRNONAA >60   < > >60 >60 >60  ?ANIONGAP 8   < > '13 15 9  '$ ? < > = values in this interval not displayed.  ?  ?Lipids No results for input(s): CHOL, TRIG, HDL, LABVLDL, LDLCALC, CHOLHDL in the last 168 hours.  ?Hematology ?Recent Labs  ?Lab 11/25/21 ?1527 11/26/21 ?0457 11/27/21 ?0436  ?WBC 13.0* 16.7* 18.0*  ?RBC 4.79 4.81 4.35  ?HGB 15.3 15.4 14.0  ?HCT 46.4 45.5 41.5  ?MCV 96.9 94.6 95.4  ?MCH 31.9 32.0 32.2  ?MCHC 33.0 33.8 33.7  ?RDW 14.0 13.9 13.7  ?PLT 324 325 269  ? ?Thyroid No results for input(s): TSH, FREET4 in the last 168 hours.  ?BNP ?Recent Labs  ?Lab 11/24/21 ?1020  ?BNP 742.7*  ?  ?DDimer No results for input(s): DDIMER in the last 168 hours.   ? ?Radiology  ?  ?DG Chest Port 1 View ? ?Result Date: 11/25/2021 ?CLINICAL DATA:  Acute respiratory failure, hypoxia EXAM: PORTABLE CHEST 1 VIEW COMPARISON:  Previous studies including the examination done on 11/24/2021 FINDINGS: Transverse diameter of heart is increased. Central pulmonary vessels are more prominent. Extensive patchy infiltrates are seen in both lungs, more so on the right side with interval worsening. There is blunting of lateral CP angles. There is no pneumothorax. IMPRESSION: Cardiomegaly. Central pulmonary vessels are more prominent suggesting CHF. There is interval worsening of extensive patchy interstitial and alveolar infiltrates in both lungs, more so on the right side suggesting worsening of pulmonary edema or worsening of multifocal pneumonia. Blunting of lateral CP angles suggests small pleural effusions. Electronically Signed   By: Elmer Picker M.D.   On: 11/25/2021 15:48  ? ?ECHOCARDIOGRAM LIMITED ? ?Result Date: 11/26/2021 ?   ECHOCARDIOGRAM LIMITED REPORT   Patient Name:   Austin Warren Date of Exam: 11/26/2021 Medical Rec #:  [id number]         Height:       66.0 in Accession #:    [id number]        Weight:       170.4 lb Date of Birth:  May 10, 1941         BSA:          1.868 m? Patient Age:    81 years          BP:           102/71 mmHg Patient Gender: M                 HR:           69 bpm. Exam Location:  Inpatient Procedure: Limited Echo, 3D Echo, Cardiac Doppler and Color Doppler Indications:     Atrial Fibrillation I48.91  History:         Patient has prior history of Echocardiogram examinations, most                  recent 10/19/2021. CAD, Stroke, Arrythmias:ventricular                  tachycardia; Risk Factors:Diabetes. Acute respiratory failure  with hypoxia, pneumonia. Hypotension, with history of essential                  hypertension. Heart failure with reduced ejection fraction.                  Nonischemic cardiomyopathy.  Sonographer:      Darlina Sicilian RDCS Referring Phys:  [id number] ADAM ROSS SCHERTZ Diagnosing Phys: Ida Rogue MD IMPRESSIONS  1. Left ventricular ejection fraction, by estimation, is 25 to 30%. The left ventricle has severely decreased function. The left ventricle demonstrates severe global hypokinesis (basal to mid anterior/anteroseptal wall motion best preserved . The left ventricular internal cavity size was mildly dilated. Left ventricular diastolic parameters are indeterminate.  2. Right ventricular systolic function is mildly reduced. There is moderately elevated pulmonary artery systolic pressure. The estimated right ventricular systolic pressure is 18.5 mmHg.  3. Left atrial size was moderately dilated.  4. Moderate mitral valve regurgitation.  5. Aortic valve regurgitation is mild to moderate. Aortic valve sclerosis is present, with no evidence of aortic valve stenosis.  6. The inferior vena cava is dilated in size with >50% respiratory variability, suggesting right atrial pressure of 8 mmHg. FINDINGS  Left Ventricle: Left ventricular ejection fraction, by estimation, is 25 to 30%. The left ventricle has severely decreased function. The left ventricle demonstrates global hypokinesis. The left ventricular internal cavity size was mildly dilated. Left ventricular diastolic parameters are indeterminate. Right Ventricle: Right ventricular systolic function is mildly reduced. There is moderately elevated pulmonary artery systolic pressure. The tricuspid regurgitant velocity is 3.34 m/s, and with an assumed right atrial pressure of 8 mmHg, the estimated right ventricular systolic pressure is 63.1 mmHg. Left Atrium: Left atrial size was moderately dilated. Mitral Valve: Moderate mitral valve regurgitation. Tricuspid Valve: Tricuspid valve regurgitation is mild. Aortic Valve: Aortic valve regurgitation is mild to moderate. Aortic regurgitation PHT measures 549 msec. Aortic valve sclerosis is present, with no evidence of aortic  valve stenosis. Venous: The inferior vena cava is dilated in size with greater than 50% respiratory variability, suggesting right atrial pressure of 8 mmHg. LEFT VENTRICLE PLAX 2D LVIDd:         5.50 cm   Diastolog

## 2021-11-28 DIAGNOSIS — Z8673 Personal history of transient ischemic attack (TIA), and cerebral infarction without residual deficits: Secondary | ICD-10-CM | POA: Diagnosis not present

## 2021-11-28 DIAGNOSIS — I4892 Unspecified atrial flutter: Secondary | ICD-10-CM

## 2021-11-28 DIAGNOSIS — I4891 Unspecified atrial fibrillation: Secondary | ICD-10-CM | POA: Diagnosis not present

## 2021-11-28 DIAGNOSIS — I5022 Chronic systolic (congestive) heart failure: Secondary | ICD-10-CM

## 2021-11-28 DIAGNOSIS — Z7189 Other specified counseling: Secondary | ICD-10-CM

## 2021-11-28 DIAGNOSIS — J9601 Acute respiratory failure with hypoxia: Secondary | ICD-10-CM | POA: Diagnosis not present

## 2021-11-28 DIAGNOSIS — I5023 Acute on chronic systolic (congestive) heart failure: Secondary | ICD-10-CM

## 2021-11-28 LAB — CBC
HCT: 43.3 % (ref 39.0–52.0)
Hemoglobin: 14.3 g/dL (ref 13.0–17.0)
MCH: 31.6 pg (ref 26.0–34.0)
MCHC: 33 g/dL (ref 30.0–36.0)
MCV: 95.6 fL (ref 80.0–100.0)
Platelets: 255 10*3/uL (ref 150–400)
RBC: 4.53 MIL/uL (ref 4.22–5.81)
RDW: 14 % (ref 11.5–15.5)
WBC: 15 10*3/uL — ABNORMAL HIGH (ref 4.0–10.5)
nRBC: 0 % (ref 0.0–0.2)

## 2021-11-28 LAB — BASIC METABOLIC PANEL
Anion gap: 11 (ref 5–15)
BUN: 32 mg/dL — ABNORMAL HIGH (ref 8–23)
CO2: 23 mmol/L (ref 22–32)
Calcium: 8.1 mg/dL — ABNORMAL LOW (ref 8.9–10.3)
Chloride: 102 mmol/L (ref 98–111)
Creatinine, Ser: 1.01 mg/dL (ref 0.61–1.24)
GFR, Estimated: >60 mL/min (ref >60)
Glucose, Bld: 127 mg/dL — ABNORMAL HIGH (ref 70–99)
Potassium: 4.2 mmol/L (ref 3.5–5.1)
Sodium: 136 mmol/L (ref 135–145)

## 2021-11-28 LAB — GLUCOSE, CAPILLARY
Glucose-Capillary: 144 mg/dL — ABNORMAL HIGH (ref 70–99)
Glucose-Capillary: 194 mg/dL — ABNORMAL HIGH (ref 70–99)
Glucose-Capillary: 150 mg/dL — ABNORMAL HIGH (ref 70–99)
Glucose-Capillary: 185 mg/dL — ABNORMAL HIGH (ref 70–99)

## 2021-11-28 LAB — MAGNESIUM: Magnesium: 2.2 mg/dL (ref 1.7–2.4)

## 2021-11-28 LAB — PHOSPHORUS: Phosphorus: 2.9 mg/dL (ref 2.5–4.6)

## 2021-11-28 MED ORDER — DIPHENHYDRAMINE HCL 50 MG/ML IJ SOLN
INTRAMUSCULAR | Status: AC
  Filled 2021-11-28: qty 1

## 2021-11-28 MED ORDER — FINASTERIDE 5 MG PO TABS
5.0000 mg | ORAL_TABLET | Freq: Every day | ORAL | Status: DC
  Administered 2021-11-28 – 2021-12-01 (×4): 5 mg via ORAL
  Filled 2021-11-28 (×4): qty 1

## 2021-11-28 MED ORDER — ALPRAZOLAM 0.25 MG PO TABS: 0.2500 mg | ORAL_TABLET | Freq: Two times a day (BID) | ORAL | Status: DC | PRN

## 2021-11-28 MED ORDER — MIDODRINE HCL 5 MG PO TABS
10.0000 mg | ORAL_TABLET | Freq: Three times a day (TID) | ORAL | Status: DC
  Administered 2021-11-28 – 2021-11-30 (×7): 10 mg via ORAL
  Filled 2021-11-28 (×7): qty 2

## 2021-11-28 MED ORDER — TAMSULOSIN HCL 0.4 MG PO CAPS
0.8000 mg | ORAL_CAPSULE | Freq: Every day | ORAL | Status: DC
  Administered 2021-11-28 – 2021-12-01 (×4): 0.8 mg via ORAL
  Filled 2021-11-28 (×4): qty 2

## 2021-11-28 MED ORDER — FUROSEMIDE 10 MG/ML IJ SOLN
80.0000 mg | Freq: Once | INTRAMUSCULAR | Status: AC
  Administered 2021-11-28: 80 mg via INTRAVENOUS
  Filled 2021-11-28: qty 8

## 2021-11-28 MED ORDER — DIGOXIN 125 MCG PO TABS
0.1250 mg | ORAL_TABLET | Freq: Every day | ORAL | Status: DC
  Administered 2021-11-28 – 2021-12-01 (×4): 0.125 mg via ORAL
  Filled 2021-11-28 (×4): qty 1

## 2021-11-28 MED ORDER — POTASSIUM CHLORIDE CRYS ER 20 MEQ PO TBCR
40.0000 meq | EXTENDED_RELEASE_TABLET | Freq: Once | ORAL | Status: AC
  Administered 2021-11-28: 40 meq via ORAL
  Filled 2021-11-28: qty 2

## 2021-11-28 NOTE — Progress Notes (Signed)
? ?NAME:  Austin Warren, MRN:  [id number], DOB:  06/27/1941, LOS: 4 ?ADMISSION DATE:  11/24/2021, CONSULTATION DATE: 11/25/21 ?REFERRING MD: Dr. Manuella Ghazi, CHIEF COMPLAINT: Shortness of Breath   ? ?History of Present Illness:  ?This is an 81 yo male who presented to [facility] ER on 05/12 with c/o worsening shortness of breath worse over the past 4 days.  He has had a hx of shortness of breath over the past few monthsHe has a hx of chronic systolic heart failure due to nonischemic cardiomyopathy with recent cardiac cath on 10/30/21 due to continued acute on chronic respiratory failure findings were consistent with non-ischemic cardiomyopathy with recommendations for continued gentle diuresis.  He saw his outpatient cardiologist on 05/12 CXR obtained which revealed possible pneumonia vs. pulmonary edema vs. amiodarone toxicity.  Therefore, pt instructed to proceed to the ER for further evaluation. ? ?ED Course ?Upon arrival to the ER pt placed on 2L O2 via nasal canula with O2 sats in the upper 90's.  Lab results revealed pct <0.10, Na+ 133, glucose 138, BUN 24, alk phos 165, and Flu A&B/Covid negative.  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  He received azithromycin and ceftriaxone.  He was subsequently admitted to the progressive care unit per hospitalist team, however he remained in the ER pending bed availability. ? ?Hospital Course ?On 05/13 while in the ER pt developed worsening acute on chronic hypoxic respiratory failure with increase in O2 requirements and atrial fibrillation with rvr.  Pt changed to stepdown status.  PCCM team consulted to assist with management.  Pt received 40 mg iv lasix; 5 mg of iv metoprolol; and placed on Bipap.    ? ? ?Significant Hospital Events: ?Including procedures, antibiotic start and stop dates in addition to other pertinent events   ?5/12: Pt  admitted to the progressive care unit with acute on chronic respiratory failure secondary to multifocal pneumonia  ?5/13: Pt developed worsening acute on chronic hypoxic respiratory failure secondary to worsening multifocal pneumonia vs. pulmonary edema requiring Bipap along with atrial fibrillation with rvr.  Pt changed to stepdown status.  PCCM team consulted   ?5/14: significant improvement in respiratory status this AM; remains on low-dose peripheral levo ?5/15 alert and awake, less SOB, on pressors ? ?Interim History / Subjective:  ?On steroids ?Increased WOB last night ?dyspnea and tachypnea with coinciding tachycardia Patient anxious with fine crackles auscultated in bilateral Patient also has not voided and is refusing to wear BIPAP. ?- 40 mg Lasix ordered >> consider daily diuresis as BP and renal Fxn allow ?Restarted on pressors ?RUQ U/S suggestive of cirrhosis. ? ?Objective   ?Blood pressure 95/72, pulse 94, temperature (!) 97.3 ?F (36.3 ?C), temperature source Oral, resp. rate (!) 23, height 5' 6"  (1.676 m), weight 76.2 kg, SpO2 95 %. ?   ?   ? ?Intake/Output Summary (Last 24 hours) at 11/28/2021 0735 ?Last data filed at 11/28/2021 9518 ?Gross per 24 hour  ?Intake 814.62 ml  ?Output 1813 ml  ?Net -998.38 ml  ? ? ?Filed Weights  ? 11/26/21 0500 11/27/21 0500 11/28/21 0500  ?Weight: 77.3 kg 75.2 kg 76.2 kg  ? ? ? ?Review of Systems: ?+weakness and +SOB ?Other:  All other systems negative ? ?Physical Examination:  ? ?General Appearance: + distress  ?EYES PERRLA, EOM intact.   ?NECK Supple, No JVD ?Pulmonary: normal breath sounds, +crackles ?CardiovascularNormal S1,S2.  No  m/r/g.   ?Abdomen: Benign, Soft, non-tender. ?Skin:   warm, no rashes, no ecchymosis  ?Extremities: normal, no cyanosis, clubbing. ?Neuro:without focal findings,  speech normal  ?PSYCHIATRIC: Mood, affect within normal limits. ? ? ?ALL OTHER ROS ARE NEGATIVE ? ? ? ?Assessment & Plan:  ?Acute on chronic hypoxic respiratory failure ?Findings  now more c/w acute heart failure with acute afib with RVR ? ?Abnormal CT chest, possible multifocal pneumonia ?Volume overload ? ? ?Severe ACUTE Hypoxic and Hypercapnic Respiratory Failure ?FiO2 as tolerated, maintain SpO2 > 88% ?- Ensure adequate pulmonary hygiene  ?Start NEBS ? ?Cardiogenic shock ?SOURCE-sepsis, CHF ?-use vasopressors to keep MAP>65 as needed ?Start MIDODRINE ?Patient states that SBP is usually around 90 ?LA normalized to 1.5 ? ? ? ?ACUTE SYSTOLIC CARDIAC FAILURE- Atrial fibrillation with RVR ?CARDIOLOGY TO START DIGOXIN ?Changed to Eliquis ?-oxygen as needed ?-Lasix as tolerated ?-follow up cardiac enzymes as indicated ?-follow up cardiology recs ? ? ?ENDO ?- ICU hypoglycemic\Hyperglycemia protocol ?-check FSBS per protocol ? ? ?GI ?GI PROPHYLAXIS as indicated ? ?NUTRITIONAL STATUS ?DIET-->TF's as tolerated ?Constipation protocol as indicated ? ? ?ELECTROLYTES ?-follow labs as needed ?-replace as needed ?-pharmacy consultation and following ? ? ?Best Practice (right click and "Reselect all SmartList Selections" daily)  ? ?Diet/type: Regular consistency (see orders) ?DVT prophylaxis: systemic heparin ?GI prophylaxis: N/A ?Lines: N/A ?Foley:  Yes, and it is still needed ?Code Status:  full code ? ? ?Labs   ?CBC: ?Recent Labs  ?Lab 11/24/21 ?1920 11/25/21 ?1527 11/26/21 ?0457 11/27/21 ?0436 11/28/21 ?0406  ?WBC 10.7* 13.0* 16.7* 18.0* 15.0*  ?NEUTROABS 7.2 9.6*  --   --   --   ?HGB 15.8 15.3 15.4 14.0 14.3  ?HCT 47.5 46.4 45.5 41.5 43.3  ?MCV 95.4 96.9 94.6 95.4 95.6  ?PLT 289 324 325 269 255  ? ? ? ?Basic Metabolic Panel: ?Recent Labs  ?Lab 11/24/21 ?1920 11/25/21 ?1515 11/26/21 ?0457 11/27/21 ?0436 11/28/21 ?0406  ?NA 131* 129* 135 133* 136  ?K 3.6 4.1 3.6 3.1* 4.2  ?CL 96* 97* 99 100 102  ?CO2 25 19* 21* 24 23  ?GLUCOSE 106* 153* 148* 142* 127*  ?BUN 24* 24* 26* 30* 32*  ?CREATININE 0.94 0.90 1.02 0.87 1.01  ?CALCIUM 8.7* 8.2* 8.4* 7.8* 8.1*  ?MG  --  2.2 2.2 2.1 2.2  ?PHOS  --  4.5 5.1* 3.0 2.9   ? ? ?GFR: ?Estimated Creatinine Clearance: 50 mL/min (by C-G formula based on SCr of 1.01 mg/dL). ?Recent Labs  ?Lab 11/24/21 ?1920 11/25/21 ?1527 11/25/21 ?1903 11/25/21 ?2148 11/26/21 ?1517 11/27/21 ?6160 11/28/21 ?0406  ?PROCALCITON <0.10  --   --   --   --   --   --   ?WBC 10.7* 13.0*  --   --  16.7* 18.0* 15.0*  ?LATICACIDVEN  --  3.0* 2.6* 1.5  --   --   --   ? ? ? ?Liver Function Tests: ?Recent Labs  ?Lab 11/24/21 ?1020 11/25/21 ?1515 11/26/21 ?0457  ?AST 27 31 546*  ?ALT 19 19 478*  ?ALKPHOS 165* 165* 159*  ?BILITOT 1.5* 1.4* 1.0  ?PROT 7.4 7.4 7.2  ?ALBUMIN 2.9* 2.7* 2.6*  ? ? ?No results for input(s): LIPASE, AMYLASE in the last 168 hours. ?No results for input(s): AMMONIA in the last 168 hours. ? ?ABG ?   ?Component Value Date/Time  ? PHART 7.41 11/25/2021 1819  ? PCO2ART 32 11/25/2021 1819  ? PO2ART 74 (L) 11/25/2021 1819  ? HCO3 20.3 11/25/2021 1819  ?  TCO2 26 10/30/2021 1026  ? ACIDBASEDEF 3.3 (H) 11/25/2021 1819  ? O2SAT 96.3 11/25/2021 1819  ? ?  ? ?Coagulation Profile: ?Recent Labs  ?Lab 11/25/21 ?1642  ?INR 2.1*  ? ? ? ?Cardiac Enzymes: ?No results for input(s): CKTOTAL, CKMB, CKMBINDEX, TROPONINI in the last 168 hours. ? ?HbA1C: ?Hgb A1c MFr Bld  ?Date/Time Value Ref Range Status  ?11/25/2021 03:15 PM 5.3 4.8 - 5.6 % Final  ?  Comment:  ?  (NOTE) ?Pre diabetes:          5.7%-6.4% ? ?Diabetes:              >6.4% ? ?Glycemic control for   <7.0% ?adults with diabetes ?  ?04/07/2019 03:36 PM 5.2 4.8 - 5.6 % Final  ?  Comment:  ?  (NOTE) ?        Prediabetes: 5.7 - 6.4 ?        Diabetes: >6.4 ?        Glycemic control for adults with diabetes: <7.0 ?  ? ? ?CBG: ?Recent Labs  ?Lab 11/26/21 ?2142 11/27/21 ?0740 11/27/21 ?1128 11/27/21 ?1650 11/27/21 ?2112  ?GLUCAP 210* 130* 265* 223* 144*  ? ?Home Medications  ?Prior to Admission medications   ?Medication Sig Start Date End Date Taking? Authorizing Provider  ?amiodarone (PACERONE) 200 MG tablet TAKE 1 TABLET BY MOUTH DAILY 03/08/21  Yes End, Harrell Gave,  MD  ?apixaban (ELIQUIS) 5 MG TABS tablet Take 1 tablet (5 mg total) by mouth every 12 (twelve) hours. 10/31/21  Yes End, Harrell Gave, MD  ?Ascorbic Acid (VITAMIN C) 500 MG tablet Take 500 mg by mouth dai

## 2021-11-28 NOTE — Progress Notes (Signed)
PHARMACY CONSULT NOTE ? ?Pharmacy Consult for Electrolyte Monitoring and Replacement  ? ?Recent Labs: ?Potassium (mmol/L)  ?Date Value  ?11/28/2021 4.2  ?05/27/2014 3.9  ? ?Magnesium (mg/dL)  ?Date Value  ?11/28/2021 2.2  ?05/27/2014 1.6 (L)  ? ?Calcium (mg/dL)  ?Date Value  ?11/28/2021 8.1 (L)  ? ?Calcium, Total (mg/dL)  ?Date Value  ?05/27/2014 9.0  ? ?Albumin (g/dL)  ?Date Value  ?11/26/2021 2.6 (L)  ?02/27/2018 4.3  ?05/27/2014 3.4  ? ?Phosphorus (mg/dL)  ?Date Value  ?11/28/2021 2.9  ?05/27/2014 3.2  ? ?Sodium (mmol/L)  ?Date Value  ?11/28/2021 136  ?08/19/2019 141  ?05/27/2014 137  ? ?Corrected Ca: 9.2 mg/dL ? ?Assessment: 81 year old male with a known history of nonobstructive coronary artery disease, persistent atrial fibrillation/flutter, recurrent ventricular tachycardia, chronic HFrEF due to nonischemic cardiomyopathy, hypertension, hyperlipidemia, stroke, and hypothyroidism admitted for multifocal pneumonia.  ? ?Diuretics: furosemide 80 mg IV x1 today ? ?Goal of Therapy:  ?Potassium 4.0 - 5.1 mmol/L ?Magnesium 2.0 - 2.4 mg/dL ?All Other Electrolytes WNL ? ?Plan:  ?No electrolyte replacement warranted for today ?Recheck electrolytes in am ? ?Dallie Piles ,PharmD ?Clinical Pharmacist ?11/28/2021 7:12 AM ? ?

## 2021-11-28 NOTE — TOC Initial Note (Signed)
Transition of Care (TOC) - Initial/Assessment Note  ? ? ?Patient Details  ?Name: Austin Warren ?MRN: [id number] ?Date of Birth: Dec 12, 1940 ? ?Transition of Care (TOC) CM/SW Contact:    ?Shelbie Hutching, RN ?Phone Number: ?11/28/2021, 3:37 PM ? ?Clinical Narrative:                 ?Patient admitted to the hospital with acute respiratory failure due to pneumonia currently requiring acute oxygen at 5L via Austin Warren. ?RNCM met with patient at the bedside, introduced self and explained role in DC planning.  Patient is from home with his wife.  He is independent and walks with a cane.  He does not drive but his wife provides transportation.  He is current with his PCP Dr. Ouida Warren.  Patient has never required [facility] services or needed to go to rehab in the past but would be agreeable if recommended. ? ?TOC will cont to follow patient progress and assist with disposition.   ? ?Expected Discharge Plan: Austin Warren ?Barriers to Discharge: Continued Medical Work up ? ? ?Patient Goals and CMS Choice ?Patient states their goals for this hospitalization and ongoing recovery are:: patient wants to get well and get back home ?  ?  ? ?Expected Discharge Plan and Services ?Expected Discharge Plan: Austin Warren ?  ?Discharge Planning Services: CM Consult ?  ?Living arrangements for the past 2 months: Austin Warren ?                ?  ?  ?  ?  ?  ?  ?  ?  ?  ?  ? ?Prior Living Arrangements/Services ?Living arrangements for the past 2 months: Austin Warren ?Lives with:: Spouse ?Patient language and need for interpreter reviewed:: Yes ?Do you feel safe going back to the place where you live?: Yes      ?Need for Family Participation in Patient Care: Yes (Comment) ?Care giver support system in place?: Yes (comment) ?Current home services: DME (cane) ?Criminal Activity/Legal Involvement Pertinent to Current Situation/Hospitalization: No - Comment as needed ? ?Activities of Daily Living ?Home Assistive  Devices/Equipment: Cane (specify quad or straight) ?ADL Screening (condition at time of admission) ?Patient's cognitive ability adequate to safely complete daily activities?: Yes ?Is the patient deaf or have difficulty hearing?: No ?Does the patient have difficulty seeing, even when wearing glasses/contacts?: No ?Does the patient have difficulty concentrating, remembering, or making decisions?: No ?Patient able to express need for assistance with ADLs?: Yes ?Does the patient have difficulty dressing or bathing?: No ?Independently performs ADLs?: Yes (appropriate for developmental age) ?Does the patient have difficulty walking or climbing stairs?: No ?Weakness of Legs: Right ?Weakness of Arms/Hands: Right ? ?Permission Sought/Granted ?Permission sought to share information with : Case Manager, Family Supports ?Permission granted to share information with : Yes, Verbal Permission Granted ? Share Information with NAME: Austin Warren ?   ? Permission granted to share info w Relationship: wife ? Permission granted to share info w Contact Information: [phone] ? ?Emotional Assessment ?Appearance:: Appears stated age ?Attitude/Demeanor/Rapport: Engaged ?Affect (typically observed): Accepting ?Orientation: : Oriented to Self, Oriented to Place, Oriented to  Time, Oriented to Situation ?Alcohol / Substance Use: Not Applicable ?Psych Involvement: No (comment) ? ?Admission diagnosis:  Pneumonia [J18.9] ?Acute respiratory failure with hypoxia ([place]) [J96.01] ?Multifocal pneumonia [J18.9] ?Patient Active Problem List  ? Diagnosis Date Noted  ? Multifocal pneumonia 11/24/2021  ? Acute respiratory failure with hypoxia ([place]) 11/24/2021  ?  Pneumonia 11/24/2021  ? History of CVA (cerebrovascular accident) 11/24/2021  ? NICM (nonischemic cardiomyopathy) ([place]) 11/10/2020  ? Coronary artery disease involving native coronary artery of native heart without angina pectoris 10/14/2019  ? Atrial flutter ([place]) 06/18/2019  ?  Hypothyroidism 06/18/2019  ? History of ventricular tachycardia 04/14/2019  ? Chronic HFrEF (heart failure with reduced ejection fraction) ([place]) 04/07/2019  ? 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 Ventricular tachyarrhythmia ([place]) 12/01/2015  ? VT (ventricular tachycardia) ([place]) 12/01/2015  ? Hypotension, with history of essential hypertension   ? Urine retention   ? Malaise and fatigue   ? Encounter for general adult medical examination without abnormal findings 03/10/2015  ? Arthritis 03/07/2015  ? Bilateral renal cysts 02/14/2015  ? Bladder filling defect 02/14/2015  ? Amyloidosis ([place])   ? Type 2 diabetes mellitus with other circulatory complications ([place]) 16/38/4536  ? HLD (hyperlipidemia) 07/02/2014  ? Ischemic stroke ([place]) 06/21/2014  ? Atrial fibrillation, chronic ([place]) 06/19/2014  ? 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 Stroke [facility])   ? Atrial fibrillation ([place]) 05/18/2014  ? CVA (cerebral vascular accident) ([place]) 05/18/2014  ? Essential hypertension 05/18/2014  ? Diabetes mellitus ([place]) 05/18/2014  ? Permanent atrial fibrillation ([place]) 05/18/2014  ? Cerebral vascular accident [facility]) 05/18/2014  ? Cerebrovascular accident (CVA) ([place]) 05/18/2014  ? Acute ischemic stroke ([place])   ? Benign prostatic hyperplasia with urinary obstruction 12/19/2013  ? Diabetes ([place]) 12/19/2013  ? Type 2 diabetes mellitus with other specified complication ([place]) 46/80/3212  ? 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 Type 2 diabetes mellitus ([place]) 12/19/2013  ? low-voltage ECG 03/05/2013  ? Hypersomnolence 03/05/2013  ? Morbid obesity ([place]) 03/05/2013  ? Obesity (BMI 30-39.9) 03/05/2013  ? Difficulty staying awake 03/05/2013  ? Abnormal prostate specific antigen 11/28/2012  ? Elevated prostate specific antigen (PSA) 11/28/2012  ? Edema 11/01/2011  ? Ventricular tachycardia ([place]) 12/01/2010  ? Abnormal  signal averaged electrocardiogram 12/01/2010  ? Idiopathic ventricular tachycardia  ([place]) 12/01/2010  ? ?PCP:  Kirk Ruths, MD ?Pharmacy:   ?[facility], [address] ?[place] ?[place] [address] ?Phone: [phone] Fax: [phone] ? ?[place], [place] [address] ?[address] ?[place] [address] ?Phone: [phone] Fax: [phone] ? ?[id number] [id number]?Phone: [phone]: [phone]? ? ? ? ?Social Determinants of Health (SDOH) Interventions ?  ? ?Readmission Risk Interventions ?   ? View : No data to display.  ?  ?  ?  ? ? ? ?

## 2021-11-28 NOTE — Progress Notes (Signed)
Respiratory Distress ?Called bedside by nursing due to patient's increased dyspnea and tachypnea with coinciding tachycardia. Patient anxious with fine crackles auscultated in bilateral lung bases. Reviewed medications, patient was not diuresed 11/27/21 and last IV solu-medrol was marked not given. Patient also has not voided and is refusing to wear BIPAP. ?Stayed bedside with patient and wife assisting with anxiety, talking through calming breathing exercises to which patient responded with improvement in tachypnea and oxygenation ?- 40 mg Lasix ordered >> consider daily diuresis as BP and renal Fxn allow ?- Bladder scan performed showing >253 mL, instructed nursing that if within 30 minutes of Lasix patient had still not voided to I&O cath ?- 0.25 Xanax ordered which worked well >> added as BID PRN ?- 40 mg of solumedrol given to complete IV steroid taper prior to transitioning to PO prednisone in AM ?- continue morphine PRN if needed ?- will wait to treat a-fib RVR until respiratory distress and anxiety have been treated >>> HR resolved into a controlled rate 100-80's ? ? ?Additional critical care time 30 minutes ? ? ? ?Domingo Pulse Rust-Chester, AGACNP-BC ?Acute Care Nurse Practitioner ?[facility]  ? ?[phone] / [phone] ?Please see Amion for pager details.  ? ? ? ?

## 2021-11-28 NOTE — Progress Notes (Signed)
Patient is resting in bed. VSS. Respirations are 21 at this time. No respiratory distress noted. Wife continues at the bedside. Call bell in reach. Will continue to monitor.  ?

## 2021-11-28 NOTE — Progress Notes (Signed)
Patient called out stating that he was short of breath. Upon entering the room, he was noticeably gasping for air while lying in bed. Respirations were in the 40s and O2 sat was in 80s. He was put on the bipap and refused to wear stating that he couldn't do that. He was then given a dose of morphine which was not very effective. O2 via [place] was increased to 2L/M and provider notified. See new orders. Afib rhythm with pulse bouncing from the 110s to the 140s. Patient was coached breathing exercises and responded well to that. Intermittent cath performed with a return of 358m. Bladder scan noted 254m. Wife is at the bedside during this time. Will continue to monitor.  ?

## 2021-11-28 NOTE — Progress Notes (Signed)
Patient is hypotensive with a MAP of 57. Levophed restarted at 67mg/min. MAP is now 73. Patient is resting in bed with eyes closed. Easily aroused and states that he feels fine. Oriented x4. Will continue to monitor. ?

## 2021-11-28 NOTE — Consult Note (Signed)
Consultation Note Date: 11/28/2021   Patient Name: Austin Warren  DOB: 04/13/1941  MRN: [id number]  Age / Sex: 81 y.o., male  PCP: Kirk Ruths, MD Referring Physician: Flora Lipps, MD  Reason for Consultation:  systolic heart failure, goals of care conversation  HPI/Patient Profile: 81 y.o. 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 Workup revealed multifocal pneumonia with resulting sepsis and cardiogenic shock. He has had respiratory failure and required bipap. He has had a fib with RVR. Hypotension has been persistent requiring levophed.    Primary Decision Maker PATIENT and his spouse  Discussion: I have reviewed medical records including Care Everywhere, progress notes from this and prior admissions, labs and imaging, discussed with RN.  On evaluation patient is awake alert and oriented. His spouse is at bedside and assists with his history and other information.   I introduced Palliative Medicine as specialized medical care for people living with serious illness. It focuses on providing relief from the symptoms and stress of a serious illness. The goal is to improve quality of life for both the patient and the family.  We discussed a brief life review of the patient. He retired at age 35 from being the [employer] for [employer]. He has one child and a few grandchildren. He enjoys traveling to see family. They are looking forward to renting a beach house with their family this summer.   As far as functional and nutritional status - prior to admission he ambulated with a cane. He was independent with all of his ADL's. Has had increasing SOB over the last 6-8 weeks that lead up to this hospitalization. No changes in weight or appetite.   We discussed patient's current  illness and what it means in the larger context of patient's on-going co-morbidities.  Natural disease trajectory and expectations at EOL were discussed.  I attempted to elicit values and goals of care important to the patient. He values spending time with his family, and just "waking up the next day".   Advance directives, concepts specific to code status, and rehospitalization were considered and discussed. He has a living will- his spouse is going to bring a copy for his chart. He would not want to be intubated.  Regarding code status- if his heart stopped and he stopped breathing he would not want CPR. He and spouse agree with DNR order.   Discussed with patient/family the importance of continued conversation with family and the medical providers regarding overall plan of care and treatment options, ensuring decisions are within the context of the patient's values and GOCs.    Symptom management was discussed- currently xanex and morphine are working well for his intermittent anxiety and shortness of breath. He is able to tolerate bipap with these.   Questions and concerns were addressed. The family was encouraged to call with questions or concerns.     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  Pneumonia  Hypothyroidism   Review of Systems  Physical Exam  Vital Signs: BP 99/66   Pulse 89   Temp 98.1 F (36.7 C) (Axillary)   Resp (!) 22   Ht '5\' 6"'$  (1.676 m)   Wt 76.2 kg   SpO2 94%   BMI  27.11 kg/m  Pain Scale: 0-10   Pain Score: 0-No pain   SpO2: SpO2: 94 % O2 Device:SpO2: 94 % O2 Flow Rate: .O2 Flow Rate (L/min): 4 L/min  IO: Intake/output summary:  Intake/Output Summary (Last 24 hours) at 11/28/2021 1000 Last data filed at 11/28/2021 0950 Gross per 24 hour  Intake 945.02 ml  Output 1463 ml  Net -517.98 ml    LBM: Last BM Date : 11/25/21 Baseline Weight: Weight: 74.8 kg Most recent weight: Weight: 76.2 kg       Thank you for this consult. Palliative medicine will continue to follow and assist as needed.   Greater than 50%  of this time was spent counseling and coordinating care related to the above assessment and plan.  Signed by: Mariana Kaufman, AGNP-C Palliative Medicine    Please contact Palliative Medicine Team phone at [phone] for questions and concerns.  For individual provider: See Shea Evans

## 2021-11-28 NOTE — Progress Notes (Signed)
41F foley inserted. Patient tolerated fairly well. Blood-tinged urine returned.  ?

## 2021-11-28 NOTE — Progress Notes (Signed)
? ? ?Progress Note ? ?Patient Name: Austin Warren ?Date of Encounter: 11/28/2021 ? ?Primary Cardiologist: End ? ?Subjective  ? ?He had an episode of dyspnea overnight lasting from around 11 to 1 AM. With this, he was tachycardic into the 140s bpm. He had received IV Lasix 40 mg around 10:45 PM. This morning, he is without chest pain, dyspnea, or palpitations. Documented UOP ~ 1 L for the past 24 hours, net - 2.9 L for the admission. He remains on supplemental oxygen via nasal cannula at 4 L. Weight 75.2 to 76.2 kg.  ? ?Inpatient Medications  ?  ?Scheduled Meds: ? apixaban  5 mg Oral BID  ? atorvastatin  10 mg Oral Daily  ? Chlorhexidine Gluconate Cloth  6 each Topical Daily  ? digoxin  0.125 mg Oral Daily  ? famotidine  20 mg Oral BID  ? insulin aspart  0-9 Units Subcutaneous TID AC & HS  ? ipratropium-albuterol  3 mL Nebulization TID  ? levothyroxine  100 mcg Oral QAC breakfast  ? mexiletine  150 mg Oral BID  ? midodrine  10 mg Oral TID WC  ? predniSONE  60 mg Oral Q breakfast  ? tamsulosin  0.8 mg Oral Daily  ? ?Continuous Infusions: ? sodium chloride Stopped (11/26/21 0456)  ? cefTRIAXone (ROCEPHIN)  IV 2 g (11/27/21 2132)  ? norepinephrine (LEVOPHED) Adult infusion 2 mcg/min (11/28/21 0503)  ? ?PRN Meds: ?acetaminophen **OR** acetaminophen, ALPRAZolam, ipratropium-albuterol, morphine injection, ondansetron **OR** ondansetron (ZOFRAN) IV  ? ?Vital Signs  ?  ?Vitals:  ? 11/28/21 0600 11/28/21 0700 11/28/21 0741 11/28/21 0800  ?BP: 95/72 102/73  100/69  ?Pulse: 94 83 81 89  ?Resp: (!) 23 19 (!) 23 (!) 27  ?Temp:   98.1 ?F (36.7 ?C)   ?TempSrc:   Axillary   ?SpO2: 95% 96% 97% 93%  ?Weight:      ?Height:      ? ? ?Intake/Output Summary (Last 24 hours) at 11/28/2021 0909 ?Last data filed at 11/28/2021 4098 ?Gross per 24 hour  ?Intake 814.62 ml  ?Output 1813 ml  ?Net -998.38 ml  ? ?Filed Weights  ? 11/26/21 0500 11/27/21 0500 11/28/21 0500  ?Weight: 77.3 kg 75.2 kg 76.2 kg  ? ? ?Telemetry  ?  ?Afib with ventricular  rates in the 80s predominantly with Afib with RVR into the 140s from 11 to 1 AM - Personally Reviewed ? ?ECG  ?  ?No new tracings - Personally Reviewed ? ?Physical Exam  ? ?GEN: No acute distress.   ?Neck: No JVD. ?Cardiac: IRIR, no murmurs, rubs, or gallops.  ?Respiratory: Diminished breath sounds bilaterally.  ?GI: Soft, nontender, non-distended.   ?MS: No edema; No deformity. ?Neuro:  Alert and oriented x 3; Nonfocal.  ?Psych: Normal affect. ? ?Labs  ?  ?Chemistry ?Recent Labs  ?Lab 11/24/21 ?1020 11/24/21 ?1920 11/25/21 ?1515 11/26/21 ?0457 11/27/21 ?0436 11/28/21 ?0406  ?NA 133*   < > 129* 135 133* 136  ?K 3.5   < > 4.1 3.6 3.1* 4.2  ?CL 99   < > 97* 99 100 102  ?CO2 26   < > 19* 21* 24 23  ?GLUCOSE 138*   < > 153* 148* 142* 127*  ?BUN 24*   < > 24* 26* 30* 32*  ?CREATININE 0.90   < > 0.90 1.02 0.87 1.01  ?CALCIUM 8.6*   < > 8.2* 8.4* 7.8* 8.1*  ?PROT 7.4  --  7.4 7.2  --   --   ?  ALBUMIN 2.9*  --  2.7* 2.6*  --   --   ?AST 27  --  31 546*  --   --   ?ALT 19  --  19 478*  --   --   ?ALKPHOS 165*  --  165* 159*  --   --   ?BILITOT 1.5*  --  1.4* 1.0  --   --   ?GFRNONAA >60   < > >60 >60 >60 >60  ?ANIONGAP 8   < > '13 15 9 11  '$ ? < > = values in this interval not displayed.  ?  ? ?Hematology ?Recent Labs  ?Lab 11/26/21 ?5638 11/27/21 ?0436 11/28/21 ?0406  ?WBC 16.7* 18.0* 15.0*  ?RBC 4.81 4.35 4.53  ?HGB 15.4 14.0 14.3  ?HCT 45.5 41.5 43.3  ?MCV 94.6 95.4 95.6  ?MCH 32.0 32.2 31.6  ?MCHC 33.8 33.7 33.0  ?RDW 13.9 13.7 14.0  ?PLT 325 269 255  ? ? ?Cardiac EnzymesNo results for input(s): TROPONINI in the last 168 hours. No results for input(s): TROPIPOC in the last 168 hours.  ? ?BNP ?Recent Labs  ?Lab 11/24/21 ?1020  ?BNP 742.7*  ?  ? ?DDimer No results for input(s): DDIMER in the last 168 hours.  ? ?Radiology  ?  ?US Abdomen Limited RUQ (LIVER/GB) ? ?Result Date: 11/26/2021 ?IMPRESSION: 1. No acute findings.  Normal gallbladder.  No bile duct dilation. 2. Heterogeneous increased liver parenchymal echogenicity.  Findings suggest cirrhosis. No liver mass. Electronically Signed   By: Lajean Manes M.D.   On: 11/26/2021 10:22   ? ?Cardiac Studies  ? ?2D echo 11/26/2021: ?1. Left ventricular ejection fraction, by estimation, is 25 to 30%. The  ?left ventricle has severely decreased function. The left ventricle  ?demonstrates severe global hypokinesis (basal to mid anterior/anteroseptal  ?wall motion best preserved . The left  ?ventricular internal cavity size was mildly dilated. Left ventricular  ?diastolic parameters are indeterminate.  ? 2. Right ventricular systolic function is mildly reduced. There is  ?moderately elevated pulmonary artery systolic pressure. The estimated  ?right ventricular systolic pressure is 75.6 mmHg.  ? 3. Left atrial size was moderately dilated.  ? 4. Moderate mitral valve regurgitation.  ? 5. Aortic valve regurgitation is mild to moderate. Aortic valve sclerosis  ?is present, with no evidence of aortic valve stenosis.  ? 6. The inferior vena cava is dilated in size with >50% respiratory  ?variability, suggesting right atrial pressure of 8 mmHg.  ?__________ ? ?R/LHC 10/30/2021: ?Conclusions: ?Mild plaquing of LCx and RCA with up to 20% narrowing.  No significant stenosis observed to explain reduced LVEF.  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 ?Mildly elevated left heart, right heart, and pulmonary artery pressures. ?Mildly reduced Fick cardiac output/index. ?  ?Recommendations: ?Continue gentle diuresis. ?Escalate goal-directed medical therapy, as tolerated. ?__________ ? ?2D echo 10/19/2021: ?1. Left ventricular ejection fraction, by estimation, is 20 to 25%. The  ?left ventricle has severely decreased function. The left ventricle  ?demonstrates global hypokinesis. The left ventricular internal cavity size  ?was mildly dilated. Left ventricular  ?diastolic parameters are indeterminate.  ? 2. Right ventricular systolic function is moderately reduced. The right  ?ventricular size is  normal.  ? 3. Left atrial size was severely dilated.  ? 4. Right atrial size was mild to moderately dilated.  ? 5. The mitral valve is normal in structure. Mild to moderate mitral valve  ?regurgitation.  ? 6. The aortic valve is tricuspid. Aortic valve regurgitation is mild.  ?  Aortic valve sclerosis/calcification is present, without any evidence of  ?aortic stenosis.  ? 7. Aortic dilatation noted. 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  ? 8. The inferior vena cava is normal in size with greater than 50%  ?respiratory variability, suggesting right atrial pressure of 3 mmHg. ? ?Patient Profile  ?   ?81 y.o. 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 ?Assessment & Plan  ?  ?1. Acute on chronic combined systolic and diastolic CHF/NICM: ?-He does not appear to be grossly volume overloaded ?-Interestingly, R/LHC 1 month ago did not show significant elevations in right heart pressures, though echo this admission was notable for a PA pressure of 52 ?-Add digoxin ?-Give a one-time dose of IV Lasix 80 mg ?-Relative hypotension requiring Levophed support precludes escalation of GDMT ?-Avoid ACE inhibitor/ARB/ARNI due to noted angioedema and beta-blocker due to noted edema ?-Moving forward, would consider addition of SGLT2 inhibitor and MRA as tolerated ?-Midodrine started by critical care, would look to discontinue moving forward ?-If significant dyspnea persists, we may need to consider RHC later this week to further estimate his hemodynamics and guide medical therapy ? ?2. Permanent Afib: ?-Largely rate controlled outside of brief episode of A-fib with RVR overnight with associated worsening dyspnea ?-Add digoxin as outlined  above ?-Unable to add beta-blocker at this time due to prior intolerance with associated edema, and with relative hypotension requiring Levophed ?-Apixaban (does not currently meet reduced dosing criteria) ?

## 2021-11-29 DIAGNOSIS — J189 Pneumonia, unspecified organism: Secondary | ICD-10-CM | POA: Diagnosis not present

## 2021-11-29 DIAGNOSIS — I428 Other cardiomyopathies: Secondary | ICD-10-CM | POA: Diagnosis not present

## 2021-11-29 DIAGNOSIS — J9601 Acute respiratory failure with hypoxia: Secondary | ICD-10-CM | POA: Diagnosis not present

## 2021-11-29 LAB — BASIC METABOLIC PANEL
Anion gap: 8 (ref 5–15)
BUN: 31 mg/dL — ABNORMAL HIGH (ref 8–23)
CO2: 25 mmol/L (ref 22–32)
Calcium: 7.9 mg/dL — ABNORMAL LOW (ref 8.9–10.3)
Chloride: 102 mmol/L (ref 98–111)
Creatinine, Ser: 0.93 mg/dL (ref 0.61–1.24)
GFR, Estimated: >60 mL/min (ref >60)
Glucose, Bld: 120 mg/dL — ABNORMAL HIGH (ref 70–99)
Potassium: 4.5 mmol/L (ref 3.5–5.1)
Sodium: 135 mmol/L (ref 135–145)

## 2021-11-29 LAB — CBC
HCT: 41 % (ref 39.0–52.0)
Hemoglobin: 13.4 g/dL (ref 13.0–17.0)
MCH: 32.1 pg (ref 26.0–34.0)
MCHC: 32.7 g/dL (ref 30.0–36.0)
MCV: 98.3 fL (ref 80.0–100.0)
Platelets: 205 10*3/uL (ref 150–400)
RBC: 4.17 MIL/uL — ABNORMAL LOW (ref 4.22–5.81)
RDW: 13.7 % (ref 11.5–15.5)
WBC: 11.8 10*3/uL — ABNORMAL HIGH (ref 4.0–10.5)
nRBC: 0 % (ref 0.0–0.2)

## 2021-11-29 LAB — GLUCOSE, CAPILLARY
Glucose-Capillary: 108 mg/dL — ABNORMAL HIGH (ref 70–99)
Glucose-Capillary: 123 mg/dL — ABNORMAL HIGH (ref 70–99)
Glucose-Capillary: 143 mg/dL — ABNORMAL HIGH (ref 70–99)
Glucose-Capillary: 231 mg/dL — ABNORMAL HIGH (ref 70–99)

## 2021-11-29 LAB — MAGNESIUM: Magnesium: 2.2 mg/dL (ref 1.7–2.4)

## 2021-11-29 LAB — PHOSPHORUS: Phosphorus: 2.8 mg/dL (ref 2.5–4.6)

## 2021-11-29 MED ORDER — FUROSEMIDE 10 MG/ML IJ SOLN: 20.0000 mg | Freq: Two times a day (BID) | INTRAMUSCULAR | Status: DC

## 2021-11-29 MED ORDER — FUROSEMIDE 10 MG/ML IJ SOLN
20.0000 mg | Freq: Two times a day (BID) | INTRAMUSCULAR | Status: DC
  Administered 2021-11-29 – 2021-11-30 (×4): 20 mg via INTRAVENOUS
  Filled 2021-11-29 (×5): qty 2

## 2021-11-29 NOTE — Progress Notes (Signed)
PHARMACY CONSULT NOTE ? ?Pharmacy Consult for Electrolyte Monitoring and Replacement  ? ?Recent Labs: ?Potassium (mmol/L)  ?Date Value  ?11/29/2021 4.5  ?05/27/2014 3.9  ? ?Magnesium (mg/dL)  ?Date Value  ?11/29/2021 2.2  ?05/27/2014 1.6 (L)  ? ?Calcium (mg/dL)  ?Date Value  ?11/29/2021 7.9 (L)  ? ?Calcium, Total (mg/dL)  ?Date Value  ?05/27/2014 9.0  ? ?Albumin (g/dL)  ?Date Value  ?11/26/2021 2.6 (L)  ?02/27/2018 4.3  ?05/27/2014 3.4  ? ?Phosphorus (mg/dL)  ?Date Value  ?11/29/2021 2.8  ?05/27/2014 3.2  ? ?Sodium (mmol/L)  ?Date Value  ?11/29/2021 135  ?08/19/2019 141  ?05/27/2014 137  ? ?Corrected Ca: 9.2 mg/dL ? ?Assessment: 81 year old male with a known history of nonobstructive coronary artery disease, persistent atrial fibrillation/flutter, recurrent ventricular tachycardia, chronic HFrEF due to nonischemic cardiomyopathy, hypertension, hyperlipidemia, stroke, and hypothyroidism admitted for multifocal pneumonia. Pharmacy is asked to follow and replace electrolytes. ? ?Diuretics: furosemide 20 mg IV BID ? ?Goal of Therapy:  ?Potassium 4.0 - 5.1 mmol/L ?Magnesium 2.0 - 2.4 mg/dL ?All Other Electrolytes WNL ? ?Plan:  ?No electrolyte replacement warranted for today ?Recheck electrolytes in am ? ?Dallie Piles ,PharmD ?Clinical Pharmacist ?11/29/2021 6:57 AM ? ?

## 2021-11-29 NOTE — Progress Notes (Signed)
? ?NAME:  Austin Warren, MRN:  [id number], DOB:  1941-01-26, LOS: 5 ?ADMISSION DATE:  11/24/2021, CONSULTATION DATE: 11/25/21 ?REFERRING MD: Dr. Manuella Ghazi, CHIEF COMPLAINT: Shortness of Breath   ? ?History of Present Illness:  ?This is an 81 yo male who presented to [facility] ER on 05/12 with c/o worsening shortness of breath worse over the past 4 days.  He has had a hx of shortness of breath over the past few monthsHe has a hx of chronic systolic heart failure due to nonischemic cardiomyopathy with recent cardiac cath on 10/30/21 due to continued acute on chronic respiratory failure findings were consistent with non-ischemic cardiomyopathy with recommendations for continued gentle diuresis.  He saw his outpatient cardiologist on 05/12 CXR obtained which revealed possible pneumonia vs. pulmonary edema vs. amiodarone toxicity.  Therefore, pt instructed to proceed to the ER for further evaluation. ? ?ED Course ?Upon arrival to the ER pt placed on 2L O2 via nasal canula with O2 sats in the upper 90's.  Lab results revealed pct <0.10, Na+ 133, glucose 138, BUN 24, alk phos 165, and Flu A&B/Covid negative.  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  He received azithromycin and ceftriaxone.  He was subsequently admitted to the progressive care unit per hospitalist team, however he remained in the ER pending bed availability. ? ?Hospital Course ?On 05/13 while in the ER pt developed worsening acute on chronic hypoxic respiratory failure with increase in O2 requirements and atrial fibrillation with rvr.  Pt changed to stepdown status.  PCCM team consulted to assist with management.  Pt received 40 mg iv lasix; 5 mg of iv metoprolol; and placed on Bipap.    ? ? ?Significant Hospital Events: ?Including procedures, antibiotic start and stop dates in addition to other pertinent events   ?5/12: Pt  admitted to the progressive care unit with acute on chronic respiratory failure secondary to multifocal pneumonia  ?5/13: Pt developed worsening acute on chronic hypoxic respiratory failure secondary to worsening multifocal pneumonia vs. pulmonary edema requiring Bipap along with atrial fibrillation with rvr.  Pt changed to stepdown status.  PCCM team consulted   ?5/14: significant improvement in respiratory status this AM; remains on low-dose peripheral levo ?5/15 alert and awake, less SOB, on pressors ? ?Interim History / Subjective:  ?SOB improved ?Off pressors ?RUQ U/S suggestive of cirrhosis. ?Now SD status ? ?Objective   ?Blood pressure (!) 90/57, pulse 73, temperature 97.6 ?F (36.4 ?C), temperature source Oral, resp. rate 17, height 5' 6"  (1.676 m), weight 74.2 kg, SpO2 95 %. ?   ?   ? ?Intake/Output Summary (Last 24 hours) at 11/29/2021 0756 ?Last data filed at 11/29/2021 0517 ?Gross per 24 hour  ?Intake 130.4 ml  ?Output 3000 ml  ?Net -2869.6 ml  ? ? ?Filed Weights  ? 11/27/21 0500 11/28/21 0500 11/29/21 0526  ?Weight: 75.2 kg 76.2 kg 74.2 kg  ? ? ? ? ? ?Review of Systems: ?Gen:  Denies  fever, sweats, chills weight loss  ?HEENT: Denies blurred vision, double vision, ear pain, eye pain, hearing loss, nose bleeds, sore throat ?Cardiac:  No dizziness, chest pain or heaviness, chest tightness,edema, No JVD ?Resp:   No cough, -sputum production, +shortness of breath,-wheezing, -hemoptysis,  ?Other:  All other systems negative ? ? ?Physical Examination:  ? ?General Appearance: No distress  ?EYES PERRLA, EOM intact.   ?NECK Supple, No JVD ?Pulmonary: normal  breath sounds, +crackels ?CardiovascularNormal S1,S2.  No m/r/g.   ?Abdomen: Benign, Soft, non-tender. ?Skin:   warm, no rashes, no ecchymosis  ?Extremities: normal, no cyanosis, clubbing. ?Neuro:without focal findings,  speech normal  ?PSYCHIATRIC: Mood, affect within normal limits. ? ? ?ALL OTHER ROS ARE NEGATIVE ? ? ? ?Assessment & Plan:  ?Acute on chronic  hypoxic respiratory failure ?Findings now more c/w acute heart failure with acute afib with RVR ? ?ACUTE SYSTOLIC CARDIAC FAILURE- EF 25% ?-oxygen as needed ?-Lasix as tolerated ?-follow up cardiac enzymes as indicated ?-follow up cardiology recs ?CARDIOLOGY TO START DIGOXIN ?Changed to Eliquis ? ?Abnormal CT chest, possible multifocal pneumonia ?Volume overload ? ? ?Severe ACUTE Hypoxic and Hypercapnic Respiratory Failure ?FiO2 as tolerated, maintain SpO2 > 88% ?- Ensure adequate pulmonary hygiene  ? ?Shock-resolved ?On midodrine ?Patient states that SBP is usually around 90 ?LA normalized to 1.5 ? ? ? ?ENDO ?- ICU hypoglycemic\Hyperglycemia protocol ?-check FSBS per protocol ? ? ?GI ?GI PROPHYLAXIS as indicated ? ?NUTRITIONAL STATUS ?DIET -->as tolerated ?Constipation protocol as indicated ? ? ?ELECTROLYTES ?-follow labs as needed ?-replace as needed ?-pharmacy consultation and following ? ? ? ?Best Practice (right click and "Reselect all SmartList Selections" daily)  ? ?Diet/type: Regular consistency (see orders) ?DVT prophylaxis: systemic heparin ?GI prophylaxis: N/A ?Lines: N/A ?Foley:  Yes, and it is still needed ?Code Status:  full code ? ? ?Labs   ?CBC: ?Recent Labs  ?Lab 11/24/21 ?1920 11/25/21 ?1527 11/26/21 ?0457 11/27/21 ?0436 11/28/21 ?0406 11/29/21 ?0515  ?WBC 10.7* 13.0* 16.7* 18.0* 15.0* 11.8*  ?NEUTROABS 7.2 9.6*  --   --   --   --   ?HGB 15.8 15.3 15.4 14.0 14.3 13.4  ?HCT 47.5 46.4 45.5 41.5 43.3 41.0  ?MCV 95.4 96.9 94.6 95.4 95.6 98.3  ?PLT 289 324 325 269 255 205  ? ? ? ?Basic Metabolic Panel: ?Recent Labs  ?Lab 11/25/21 ?1515 11/26/21 ?0457 11/27/21 ?0436 11/28/21 ?0406 11/29/21 ?0515  ?NA 129* 135 133* 136 135  ?K 4.1 3.6 3.1* 4.2 4.5  ?CL 97* 99 100 102 102  ?CO2 19* 21* 24 23 25   ?GLUCOSE 153* 148* 142* 127* 120*  ?BUN 24* 26* 30* 32* 31*  ?CREATININE 0.90 1.02 0.87 1.01 0.93  ?CALCIUM 8.2* 8.4* 7.8* 8.1* 7.9*  ?MG 2.2 2.2 2.1 2.2 2.2  ?PHOS 4.5 5.1* 3.0 2.9 2.8  ? ? ?GFR: ?Estimated  Creatinine Clearance: 54.3 mL/min (by C-G formula based on SCr of 0.93 mg/dL). ?Recent Labs  ?Lab 11/24/21 ?1920 11/25/21 ?1527 11/25/21 ?1903 11/25/21 ?2148 11/26/21 ?0457 11/27/21 ?0436 11/28/21 ?0406 11/29/21 ?0515  ?PROCALCITON <0.10  --   --   --   --   --   --   --   ?WBC 10.7* 13.0*  --   --  16.7* 18.0* 15.0* 11.8*  ?LATICACIDVEN  --  3.0* 2.6* 1.5  --   --   --   --   ? ? ? ?Liver Function Tests: ?Recent Labs  ?Lab 11/24/21 ?1020 11/25/21 ?1515 11/26/21 ?0457  ?AST 27 31 546*  ?ALT 19 19 478*  ?ALKPHOS 165* 165* 159*  ?BILITOT 1.5* 1.4* 1.0  ?PROT 7.4 7.4 7.2  ?ALBUMIN 2.9* 2.7* 2.6*  ? ? ?No results for input(s): LIPASE, AMYLASE in the last 168 hours. ?No results for input(s): AMMONIA in the last 168 hours. ? ?ABG ?   ?Component Value Date/Time  ? PHART 7.41 11/25/2021 1819  ? PCO2ART 32 11/25/2021 1819  ? PO2ART 74 (L) 11/25/2021 1819  ?  HCO3 20.3 11/25/2021 1819  ? TCO2 26 10/30/2021 1026  ? ACIDBASEDEF 3.3 (H) 11/25/2021 1819  ? O2SAT 96.3 11/25/2021 1819  ? ?  ? ?Coagulation Profile: ?Recent Labs  ?Lab 11/25/21 ?1642  ?INR 2.1*  ? ? ? ?Cardiac Enzymes: ?No results for input(s): CKTOTAL, CKMB, CKMBINDEX, TROPONINI in the last 168 hours. ? ?HbA1C: ?Hgb A1c MFr Bld  ?Date/Time Value Ref Range Status  ?11/25/2021 03:15 PM 5.3 4.8 - 5.6 % Final  ?  Comment:  ?  (NOTE) ?Pre diabetes:          5.7%-6.4% ? ?Diabetes:              >6.4% ? ?Glycemic control for   <7.0% ?adults with diabetes ?  ?04/07/2019 03:36 PM 5.2 4.8 - 5.6 % Final  ?  Comment:  ?  (NOTE) ?        Prediabetes: 5.7 - 6.4 ?        Diabetes: >6.4 ?        Glycemic control for adults with diabetes: <7.0 ?  ? ? ?CBG: ?Recent Labs  ?Lab 11/28/21 ?7017 11/28/21 ?1104 11/28/21 ?1609 11/28/21 ?2103 11/29/21 ?0747  ?GLUCAP 144* 194* 150* 185* 108*  ? ?Home Medications  ?Prior to Admission medications   ?Medication Sig Start Date End Date Taking? Authorizing Provider  ?amiodarone (PACERONE) 200 MG tablet TAKE 1 TABLET BY MOUTH DAILY 03/08/21  Yes End,  Harrell Gave, MD  ?apixaban (ELIQUIS) 5 MG TABS tablet Take 1 tablet (5 mg total) by mouth every 12 (twelve) hours. 10/31/21  Yes End, Harrell Gave, MD  ?Ascorbic Acid (VITAMIN C) 500 MG tablet Take 500 mg by mouth

## 2021-11-29 NOTE — Progress Notes (Signed)
? ?Progress Note ? ?Patient Name: Austin Warren ?Date of Encounter: 11/29/2021 ? ?[facility] Cardiologist: Nelva Bush, MD  ? ?Subjective  ? ?Patient seen on AM rounds. Denies any chest pain or shortness of breath. He is lying in bed with family member by the bedside. Has tolerated being off of levophed drip, blood pressure this morning 106/50. Remains in atrial fibrillation that is rate controlled 60's -80's. 3 liters in urine output in the last 24 hours. Down 2 Kg on his weight overnight. [place] on supplemental oxygen at 3L via [place]. ? ?Inpatient Medications  ?  ?Scheduled Meds: ? apixaban  5 mg Oral BID  ? atorvastatin  10 mg Oral Daily  ? Chlorhexidine Gluconate Cloth  6 each Topical Daily  ? digoxin  0.125 mg Oral Daily  ? famotidine  20 mg Oral BID  ? finasteride  5 mg Oral Daily  ? insulin aspart  0-9 Units Subcutaneous TID AC & HS  ? ipratropium-albuterol  3 mL Nebulization TID  ? levothyroxine  100 mcg Oral QAC breakfast  ? mexiletine  150 mg Oral BID  ? midodrine  10 mg Oral TID WC  ? predniSONE  60 mg Oral Q breakfast  ? tamsulosin  0.8 mg Oral Daily  ? ?Continuous Infusions: ? sodium chloride Stopped (11/26/21 0456)  ? ?PRN Meds: ?acetaminophen **OR** acetaminophen, ALPRAZolam, ipratropium-albuterol, morphine injection, ondansetron **OR** ondansetron (ZOFRAN) IV  ? ?Vital Signs  ?  ?Vitals:  ? 11/29/21 0021 11/29/21 0348 11/29/21 0526 11/29/21 0830  ?BP: 97/61 (!) 90/57  99/65  ?Pulse: 73 73  81  ?Resp: (!) 21 17  (!) 25  ?Temp: 97.7 ?F (36.5 ?C) 97.6 ?F (36.4 ?C)  98.2 ?F (36.8 ?C)  ?TempSrc: Axillary Oral  Oral  ?SpO2: 97% 95%  96%  ?Weight:   74.2 kg   ?Height:      ? ? ?Intake/Output Summary (Last 24 hours) at 11/29/2021 0911 ?Last data filed at 11/29/2021 0800 ?Gross per 24 hour  ?Intake 280.4 ml  ?Output 3000 ml  ?Net -2719.6 ml  ? ? ?  11/29/2021  ?  5:26 AM 11/28/2021  ?  5:00 AM 11/27/2021  ?  5:00 AM  ?Last 3 Weights  ?Weight (lbs) 163 lb 9.3 oz 167 lb 15.9 oz 165 lb 12.6 oz  ?Weight (kg)  74.2 kg 76.2 kg 75.2 kg  ?   ? ?Telemetry  ?  ?Rate controlled atrial fibrillation with aberrancy - Personally Reviewed ? ?ECG  ?  ?No recent studies have been completed. ? ?Physical Exam  ? ?GEN: No acute distress.   ?Neck: No JVD ?Cardiac: irregularly irregular rate and rhythm, I/VI systolic murmur, without rubs, or gallops.  ?Respiratory: Diminished to auscultation bilaterally.Respirations are unlabored at rest on 3L of O2 via [place] ?GI: Soft, nontender, non-distended  ?MS: No edema; No deformity. ?Neuro:  Nonfocal  ?Psych: Normal affect  ? ?Labs  ?  ?High Sensitivity Troponin:   ?Recent Labs  ?Lab 11/25/21 ?1515  ?TROPONINIHS 32*  ?   ?Chemistry ?Recent Labs  ?Lab 11/24/21 ?1020 11/24/21 ?1920 11/25/21 ?1515 11/26/21 ?0457 11/27/21 ?0436 11/28/21 ?0406 11/29/21 ?0515  ?NA 133*   < > 129* 135 133* 136 135  ?K 3.5   < > 4.1 3.6 3.1* 4.2 4.5  ?CL 99   < > 97* 99 100 102 102  ?CO2 26   < > 19* 21* '24 23 25  '$ ?GLUCOSE 138*   < > 153* 148* 142* 127* 120*  ?BUN  24*   < > 24* 26* 30* 32* 31*  ?CREATININE 0.90   < > 0.90 1.02 0.87 1.01 0.93  ?CALCIUM 8.6*   < > 8.2* 8.4* 7.8* 8.1* 7.9*  ?MG  --    < > 2.2 2.2 2.1 2.2 2.2  ?PROT 7.4  --  7.4 7.2  --   --   --   ?ALBUMIN 2.9*  --  2.7* 2.6*  --   --   --   ?AST 27  --  31 546*  --   --   --   ?ALT 19  --  19 478*  --   --   --   ?ALKPHOS 165*  --  165* 159*  --   --   --   ?BILITOT 1.5*  --  1.4* 1.0  --   --   --   ?GFRNONAA >60   < > >60 >60 >60 >60 >60  ?ANIONGAP 8   < > '13 15 9 11 8  '$ ? < > = values in this interval not displayed.  ?  ?Lipids No results for input(s): CHOL, TRIG, HDL, LABVLDL, LDLCALC, CHOLHDL in the last 168 hours.  ?Hematology ?Recent Labs  ?Lab 11/27/21 ?0436 11/28/21 ?0406 11/29/21 ?0515  ?WBC 18.0* 15.0* 11.8*  ?RBC 4.35 4.53 4.17*  ?HGB 14.0 14.3 13.4  ?HCT 41.5 43.3 41.0  ?MCV 95.4 95.6 98.3  ?MCH 32.2 31.6 32.1  ?MCHC 33.7 33.0 32.7  ?RDW 13.7 14.0 13.7  ?PLT 269 255 205  ? ?Thyroid  ?Recent Labs  ?Lab 11/25/21 ?1903  ?TSH 4.360  ?  ?BNP ?Recent Labs   ?Lab 11/24/21 ?1020  ?BNP 742.7*  ?  ?DDimer No results for input(s): DDIMER in the last 168 hours.  ? ?Radiology  ?  ?No results found. ? ?Cardiac Studies  ?Limited Echocardiogram completed  on 11/26/2021 ? 1. Left ventricular ejection fraction, by estimation, is 25 to 30%. The  ?left ventricle has severely decreased function. The left ventricle  ?demonstrates severe global hypokinesis (basal to mid anterior/anteroseptal  ?wall motion best preserved . The left  ?ventricular internal cavity size was mildly dilated. Left ventricular  ?diastolic parameters are indeterminate.  ? 2. Right ventricular systolic function is mildly reduced. There is  ?moderately elevated pulmonary artery systolic pressure. The estimated  ?right ventricular systolic pressure is 22.6 mmHg.  ? 3. Left atrial size was moderately dilated.  ? 4. Moderate mitral valve regurgitation.  ? 5. Aortic valve regurgitation is mild to moderate. Aortic valve sclerosis  ?is present, with no evidence of aortic valve stenosis.  ? 6. The inferior vena cava is dilated in size with >50% respiratory  ?variability, suggesting right atrial pressure of 8 mmHg.  ? ?Patient Profile  ?   ?81 y.o. 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 ? ?Assessment & Plan  ? ?  1. Acute on chronic combined systolic and diastolic CHF/NICM ?- R/LHC 1 month ago did not show significant elevations in right heart pressures, though echocardiogram this admission was notable for a PA pressure of 52 ?- Diuresed well last evening with output of 3 liters in 24 hours ?- Will add 20 mg furosemide IV bid to continue to dieresis ?- Avoid ACE/ARB/ARNI due to noted angioedema and beta blocker due  to  noted edema ?- Later on can consider addition of SGLT2 inhibitor as tolerated ?- If worsening dyspnea persists, can consider looking to repeat RHC later this week to further estimate his hemodynamics and help guide medical therapy ? ?2. Permanent atrial fibrillation ?- Mostly rate controlled since episode evening before last ?- Continue digoxin 0.125 mg daily for now ?- Continue apixaban 5 mg bid ?- No current beta blocker due to prior intolerance ? ?3. History of recurrent ventricular tachycardia ?- No evidence of significant VT on telemetry ?- Continue PTA mexiletine ?- Continue to monitor, trend, and replace electrolytes as needed ?- Potassium and Mg continue to remain at goal ? ?For questions or updates, please contact [place] ?Please consult [email or web address] for contact info under  ? ?  ?   ?Signed, ?Arthor Gorter, NP  ?11/29/2021, 9:11 AM   ? ?

## 2021-11-30 DIAGNOSIS — E1159 Type 2 diabetes mellitus with other circulatory complications: Secondary | ICD-10-CM

## 2021-11-30 DIAGNOSIS — I2510 Atherosclerotic heart disease of native coronary artery without angina pectoris: Secondary | ICD-10-CM

## 2021-11-30 DIAGNOSIS — Z8679 Personal history of other diseases of the circulatory system: Secondary | ICD-10-CM

## 2021-11-30 DIAGNOSIS — I959 Hypotension, unspecified: Secondary | ICD-10-CM

## 2021-11-30 DIAGNOSIS — I5022 Chronic systolic (congestive) heart failure: Secondary | ICD-10-CM | POA: Diagnosis not present

## 2021-11-30 DIAGNOSIS — J9601 Acute respiratory failure with hypoxia: Secondary | ICD-10-CM | POA: Diagnosis not present

## 2021-11-30 LAB — BASIC METABOLIC PANEL
Anion gap: 8 (ref 5–15)
BUN: 31 mg/dL — ABNORMAL HIGH (ref 8–23)
CO2: 27 mmol/L (ref 22–32)
Calcium: 8.1 mg/dL — ABNORMAL LOW (ref 8.9–10.3)
Chloride: 101 mmol/L (ref 98–111)
Creatinine, Ser: 0.76 mg/dL (ref 0.61–1.24)
GFR, Estimated: >60 mL/min (ref >60)
Glucose, Bld: 111 mg/dL — ABNORMAL HIGH (ref 70–99)
Potassium: 4 mmol/L (ref 3.5–5.1)
Sodium: 136 mmol/L (ref 135–145)

## 2021-11-30 LAB — GLUCOSE, CAPILLARY
Glucose-Capillary: 94 mg/dL (ref 70–99)
Glucose-Capillary: 160 mg/dL — ABNORMAL HIGH (ref 70–99)
Glucose-Capillary: 207 mg/dL — ABNORMAL HIGH (ref 70–99)
Glucose-Capillary: 180 mg/dL — ABNORMAL HIGH (ref 70–99)

## 2021-11-30 LAB — CBC
HCT: 43.1 % (ref 39.0–52.0)
Hemoglobin: 14.1 g/dL (ref 13.0–17.0)
MCH: 31.8 pg (ref 26.0–34.0)
MCHC: 32.7 g/dL (ref 30.0–36.0)
MCV: 97.1 fL (ref 80.0–100.0)
Platelets: 204 10*3/uL (ref 150–400)
RBC: 4.44 MIL/uL (ref 4.22–5.81)
RDW: 13.7 % (ref 11.5–15.5)
WBC: 8.8 10*3/uL (ref 4.0–10.5)
nRBC: 0 % (ref 0.0–0.2)

## 2021-11-30 LAB — PHOSPHORUS: Phosphorus: 4 mg/dL (ref 2.5–4.6)

## 2021-11-30 LAB — MAGNESIUM: Magnesium: 2.4 mg/dL (ref 1.7–2.4)

## 2021-11-30 MED ORDER — ASCORBIC ACID 500 MG PO TABS
500.0000 mg | ORAL_TABLET | Freq: Every day | ORAL | Status: DC
  Administered 2021-11-30 – 2021-12-01 (×2): 500 mg via ORAL
  Filled 2021-11-30 (×2): qty 1

## 2021-11-30 MED ORDER — DOCUSATE SODIUM 100 MG PO CAPS
100.0000 mg | ORAL_CAPSULE | Freq: Two times a day (BID) | ORAL | Status: DC
  Administered 2021-11-30 – 2021-12-01 (×2): 100 mg via ORAL
  Filled 2021-11-30 (×2): qty 1

## 2021-11-30 MED ORDER — VITAMIN D 25 MCG (1000 UNIT) PO TABS
1000.0000 [IU] | ORAL_TABLET | Freq: Every day | ORAL | Status: DC
  Administered 2021-11-30 – 2021-12-01 (×2): 1000 [IU] via ORAL
  Filled 2021-11-30 (×2): qty 1

## 2021-11-30 MED ORDER — ADULT MULTIVITAMIN W/MINERALS CH
1.0000 | ORAL_TABLET | Freq: Every day | ORAL | Status: DC
  Administered 2021-11-30 – 2021-12-01 (×2): 1 via ORAL
  Filled 2021-11-30 (×2): qty 1

## 2021-11-30 MED ORDER — FINASTERIDE 5 MG PO TABS: 5.0000 mg | ORAL_TABLET | Freq: Every day | ORAL | Status: DC

## 2021-11-30 MED ORDER — MIDODRINE HCL 5 MG PO TABS
5.0000 mg | ORAL_TABLET | Freq: Two times a day (BID) | ORAL | Status: DC
  Administered 2021-11-30 – 2021-12-01 (×2): 5 mg via ORAL
  Filled 2021-11-30 (×2): qty 1

## 2021-11-30 MED ORDER — TAMSULOSIN HCL 0.4 MG PO CAPS: 0.8000 mg | ORAL_CAPSULE | Freq: Every day | ORAL | Status: DC

## 2021-11-30 NOTE — Evaluation (Signed)
Occupational Therapy Evaluation Patient Details Name: Austin Warren MRN: [id number] DOB: October 20, 1940 Today's Date: 11/30/2021   History of Present Illness 81 yo male who presented to [facility] ER 05/12 with c/o shortness of breath, has had a hx of shortness of breath over the past few months.  H/o chronic systolic heart failure due to nonischemic cardiomyopathy with recent cardiac cath on 10/30/21 due to continued acute on chronic respiratory failure findings were consistent with non-ischemic cardiomyopathy.  Admitted with possible pneumonia.   Clinical Impression   Austin Warren was seen for OT evaluation this date. Prior to hospital admission, pt was MOD I for mobility using RW or SPC as needed. Pt lives with spouse. Pt presents to acute OT demonstrating impaired ADL performance and functional mobility 2/2 decreased activity tolerance and functional strength deficits. Pt currently requires CGA + SPC for toilet t/f improving to SBA + RW grooming standing and ~20 ft mobility - assist for O2 line mgmt. MIN A for LB access in sitting - reports baseline 2/2 hx of R hemiplegia. Pt would benefit from skilled OT to address noted impairments and functional limitations (see below for any additional details). Upon hospital discharge, recommend HHOTto maximize pt safety and return to PLOF.    Recommendations for follow up therapy are one component of a multi-disciplinary discharge planning process, led by the attending physician.  Recommendations may be updated based on patient status, additional functional criteria and insurance authorization.   Follow Up Recommendations  Home health OT    Assistance Recommended at Discharge Set up Supervision/Assistance  Patient can return home with the following A little help with walking and/or transfers;A little help with bathing/dressing/bathroom    Functional Status Assessment  Patient has had a recent decline in their functional status and demonstrates the ability to  make significant improvements in function in a reasonable and predictable amount of time.  Equipment Recommendations  None recommended by OT    Recommendations for Other Services       Precautions / Restrictions Precautions Precautions: Fall Restrictions Weight Bearing Restrictions: No      Mobility Bed Mobility Overal bed mobility: Needs Assistance Bed Mobility: Sit to Supine       Sit to supine: Min assist        Transfers Overall transfer level: Needs assistance Equipment used: Straight cane Transfers: Sit to/from Stand Sit to Stand: Supervision                  Balance Overall balance assessment: Needs assistance Sitting-balance support: No upper extremity supported, Feet supported Sitting balance-Leahy Scale: Good     Standing balance support: No upper extremity supported, During functional activity Standing balance-Leahy Scale: Good                             ADL either performed or assessed with clinical judgement   ADL Overall ADL's : Needs assistance/impaired                                       General ADL Comments: CGA + SPC for toilet t/f improving to SBA + RW grooming standing and ~20 ft mobility - assist for O2 line mgmt. MIN A for LB access in sitting - reports baseline 2/2 hx of R hemiplegia.      Pertinent Vitals/Pain Pain Assessment Pain Assessment: No/denies pain  Hand Dominance     Extremity/Trunk Assessment Upper Extremity Assessment Upper Extremity Assessment: Generalized weakness (hx R hemiplegia)   Lower Extremity Assessment Lower Extremity Assessment: Generalized weakness       Communication Communication Communication: No difficulties   Cognition Arousal/Alertness: Awake/alert Behavior During Therapy: WFL for tasks assessed/performed Overall Cognitive Status: Within Functional Limits for tasks assessed                                          Exercises      Shoulder Instructions      Home Living Family/patient expects to be discharged to:: Private residence Living Arrangements: Spouse/significant other Available Help at Discharge: Family;Available 24 hours/day   Home Access: Stairs to enter Entrance Stairs-Number of Steps: 1 Entrance Stairs-Rails: None Home Layout: One level               Home Equipment: Conservation officer, nature (2 wheels);Cane - single point          Prior Functioning/Environment Prior Level of Function : Independent/Modified Independent             Mobility Comments: typically uses walker in the home, SPC out of the home, does not drive but out with wife regularly ADLs Comments: independent        OT Problem List: Decreased activity tolerance;Decreased strength      OT Treatment/Interventions: Self-care/ADL training;Therapeutic exercise;Energy conservation;DME and/or AE instruction;Therapeutic activities;Patient/family education;Balance training    OT Goals(Current goals can be found in the care plan section) Acute Rehab OT Goals Patient Stated Goal: to go home OT Goal Formulation: With patient Time For Goal Achievement: 12/14/21 Potential to Achieve Goals: Good ADL Goals Pt Will Perform Grooming: with modified independence;standing Pt Will Perform Lower Body Dressing: with modified independence;with adaptive equipment;with caregiver independent in assisting;sit to/from stand Pt Will Transfer to Toilet: with modified independence;ambulating;regular height toilet  OT Frequency: Min 2X/week    Co-evaluation              AM-PAC OT "6 Clicks" Daily Activity     Outcome Measure Help from another person eating meals?: None Help from another person taking care of personal grooming?: A Little Help from another person toileting, which includes using toliet, bedpan, or urinal?: A Little Help from another person bathing (including washing, rinsing, drying)?: A Little Help from another person to put on  and taking off regular upper body clothing?: None Help from another person to put on and taking off regular lower body clothing?: A Little 6 Click Score: 20   End of Session Equipment Utilized During Treatment: Oxygen;Rolling walker (2 wheels)  Activity Tolerance: Patient tolerated treatment well Patient left: in bed;with call bell/phone within reach;with bed alarm set  OT Visit Diagnosis: Unsteadiness on feet (R26.81)                Time: 9024-0973 OT Time Calculation (min): 21 min Charges:  OT General Charges $OT Visit: 1 Visit OT Evaluation $OT Eval Low Complexity: 1 Low OT Treatments $Self Care/Home Management : 8-22 mins  Dessie Coma, M.S. OTR/L  11/30/21, 3:57 PM  ascom [phone]

## 2021-11-30 NOTE — Progress Notes (Signed)
Palliative-   Chart reviewed.  Patient is progressing well.  Goals of care have been clarified.   Palliative will sign off for now. Please feel free to consult [place] again if needed.   Mariana Kaufman, AGNP-C Palliative Medicine  No charge

## 2021-11-30 NOTE — Progress Notes (Signed)
Foley removed at 11:00.

## 2021-11-30 NOTE — Progress Notes (Signed)
Progress Note  Patient Name: Austin Warren Date of Encounter: 11/30/2021  [facility] Cardiologist: Nelva Bush, MD   Subjective   Patient seen on AM rounds. Denies any chest pain and endorses improved breathing without worsening shortness of breath. Will be working with physical therapy today. Family member remains at the bedside. Blood pressure running systolic 41-287'O. 2.3 L out in urine in the last 24 hours. Remains in antral fibrillation that is rate controlled 60-80's. Remains on supplemental  oxygen 3L [place].  Inpatient Medications    Scheduled Meds:  apixaban  5 mg Oral BID   atorvastatin  10 mg Oral Daily   Chlorhexidine Gluconate Cloth  6 each Topical Daily   digoxin  0.125 mg Oral Daily   famotidine  20 mg Oral BID   finasteride  5 mg Oral Daily   furosemide  20 mg Intravenous BID   insulin aspart  0-9 Units Subcutaneous TID AC & HS   ipratropium-albuterol  3 mL Nebulization TID   levothyroxine  100 mcg Oral QAC breakfast   mexiletine  150 mg Oral BID   midodrine  10 mg Oral TID WC   predniSONE  60 mg Oral Q breakfast   tamsulosin  0.8 mg Oral Daily   Continuous Infusions:  sodium chloride Stopped (11/26/21 0456)   PRN Meds: acetaminophen **OR** acetaminophen, ALPRAZolam, ipratropium-albuterol, morphine injection, ondansetron **OR** ondansetron (ZOFRAN) IV   Vital Signs    Vitals:   11/29/21 1921 11/29/21 2011 11/29/21 2339 11/30/21 0407  BP: 105/65  93/67 (!) 90/59  Pulse: 80  73 79  Resp: '18  18 18  '$ Temp: 98 F (36.7 C)  97.7 F (36.5 C) (!) 97.5 F (36.4 C)  TempSrc: Oral  Oral Oral  SpO2: 96% 93% 95% 95%  Weight:      Height:        Intake/Output Summary (Last 24 hours) at 11/30/2021 0828 Last data filed at 11/29/2021 2110 Gross per 24 hour  Intake --  Output 2300 ml  Net -2300 ml      11/29/2021    5:26 AM 11/28/2021    5:00 AM 11/27/2021    5:00 AM  Last 3 Weights  Weight (lbs) 163 lb 9.3 oz 167 lb 15.9 oz 165 lb 12.6 oz   Weight (kg) 74.2 kg 76.2 kg 75.2 kg      Telemetry    Atrial fibrillation/ atrial flutter rate controlled 60-80's- Personally Reviewed  ECG    No recent study completed  Physical Exam   GEN: No acute distress.  Sitting in the recliner.  Neck: No JVD appreciated Cardiac: irregularly irregular, I/VI systolic murmur,  without rubs, or gallops.  Respiratory: Clear to auscultation in the upper lobes diminished bases bilaterally. Forced expiratory wheezing noted. Respirations are unlabored at rest. O2 via  at 3L GI: Soft, nontender, non-distended, bowel sounds present in all 4 quadrants  MS: No edema; No deformity. Neuro:  Nonfocal  Psych: Normal affect   Labs    High Sensitivity Troponin:   Recent Labs  Lab 11/25/21 1515  TROPONINIHS 32*     Chemistry Recent Labs  Lab 11/24/21 1020 11/24/21 1920 11/25/21 1515 11/26/21 0457 11/27/21 0436 11/28/21 0406 11/29/21 0515 11/30/21 0605  NA 133*   < > 129* 135   < > 136 135 136  K 3.5   < > 4.1 3.6   < > 4.2 4.5 4.0  CL 99   < > 97* 99   < > 102  102 101  CO2 26   < > 19* 21*   < > '23 25 27  '$ GLUCOSE 138*   < > 153* 148*   < > 127* 120* 111*  BUN 24*   < > 24* 26*   < > 32* 31* 31*  CREATININE 0.90   < > 0.90 1.02   < > 1.01 0.93 0.76  CALCIUM 8.6*   < > 8.2* 8.4*   < > 8.1* 7.9* 8.1*  MG  --    < > 2.2 2.2   < > 2.2 2.2 2.4  PROT 7.4  --  7.4 7.2  --   --   --   --   ALBUMIN 2.9*  --  2.7* 2.6*  --   --   --   --   AST 27  --  31 546*  --   --   --   --   ALT 19  --  19 478*  --   --   --   --   ALKPHOS 165*  --  165* 159*  --   --   --   --   BILITOT 1.5*  --  1.4* 1.0  --   --   --   --   GFRNONAA >60   < > >60 >60   < > >60 >60 >60  ANIONGAP 8   < > 13 15   < > '11 8 8   '$ < > = values in this interval not displayed.    Lipids No results for input(s): CHOL, TRIG, HDL, LABVLDL, LDLCALC, CHOLHDL in the last 168 hours.  Hematology Recent Labs  Lab 11/28/21 0406 11/29/21 0515 11/30/21 0605  WBC 15.0* 11.8* 8.8   RBC 4.53 4.17* 4.44  HGB 14.3 13.4 14.1  HCT 43.3 41.0 43.1  MCV 95.6 98.3 97.1  MCH 31.6 32.1 31.8  MCHC 33.0 32.7 32.7  RDW 14.0 13.7 13.7  PLT 255 205 204   Thyroid  Recent Labs  Lab 11/25/21 1903  TSH 4.360    BNP Recent Labs  Lab 11/24/21 1020  BNP 742.7*    DDimer No results for input(s): DDIMER in the last 168 hours.   Radiology    No results found.  Cardiac Studies   Right/Left Heart Cath and Coronary Angiography completed 10/30/2021 Mild plaquing of LCx and RCA with up to 20% narrowing.  No significant stenosis observed to explain reduced LVEF.  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 Mildly elevated left heart, right heart, and pulmonary artery pressures. Mildly reduced Fick cardiac output/index.  Limited Echocardiogram completed on 11/29/2021 1. Left ventricular ejection fraction, by estimation, is 25 to 30%. The  left ventricle has severely decreased function. The left ventricle  demonstrates severe global hypokinesis (basal to mid anterior/anteroseptal  wall motion best preserved . The left  ventricular internal cavity size was mildly dilated. Left ventricular  diastolic parameters are indeterminate.   2. Right ventricular systolic function is mildly reduced. There is  moderately elevated pulmonary artery systolic pressure. The estimated  right ventricular systolic pressure is 45.4 mmHg.   3. Left atrial size was moderately dilated.   4. Moderate mitral valve regurgitation.   5. Aortic valve regurgitation is mild to moderate. Aortic valve sclerosis  is present, with no evidence of aortic valve stenosis.   6. The inferior vena cava is dilated in size with >50% respiratory  variability, suggesting right atrial pressure of 8 mmHg.  Patient Profile  81 y.o. 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  Assessment & Plan    Acute on chronic combined systolic and diastolic CHF/ NICM -R/LHC 1 month ago did not show significant elevations in right heart pressures, though echocardiogram this admission was notable for PA pressure of 52 - Diuresed well last 24 hours with 2.3 L of output - Creatinine remains stable will continue to diurese with 20 mg of lasix IV bid - Not a candidate for ACE/ARB/ARNI due to history of angioedema - Can consider adding SGLT2 inhibitor as tolerated as outpatient - Since breathing seems to be improving will hold off on repeat RHC at this time - Previously on levophed drip, started on midodrine 10 mg tic by PCCM, continued to support blood pressure and allow for dieresis   2. Permanent atrial fibrillation/ atrial flutter - Remains rate controlled - Continue digoxin 0.125 mg daily for now - Continue apixaban 5 mg bid - No current beta blocker due to prior intolerance  3. History of recurrent ventricular tachycardia - No evidence of significant VT on telemetry  - Continue PTA mexiletine - Continue to monitor, trend, and replace electrolytes as needed - Potassium and Mg continue to remain at goal     For questions or updates, please contact [facility] Please consult [email or web address] for contact info under        Signed, Kayson Tasker, NP  11/30/2021, 8:28 AM

## 2021-11-30 NOTE — TOC Progression Note (Signed)
Transition of Care [facility]) - Progression Note    Patient Details  Name: Austin Warren MRN: [id number] Date of Birth: [birth year]  [facility] CM/SW Contact  Laurena Slimmer, RN Phone Number: 11/30/2021, 1:47 PM  Clinical Narrative:    Spoke with patient's wife about [facility] PT. Wife did not have a preference of agency. Referral sent to [facility].    Expected Discharge Plan: [place] Barriers to Discharge: Continued Medical Work up  Expected Discharge Plan and Services Expected Discharge Plan: [place]   Discharge Planning Services: CM Consult   Living arrangements for the past 2 months: Single Family Home                                       Social Determinants of Health (SDOH) Interventions    Readmission Risk Interventions     View : No data to display.

## 2021-11-30 NOTE — Care Management Important Message (Signed)
Important Message  Patient Details  Name: Austin Warren MRN: [id number] Date of Birth: February 04, 1941   Medicare Important Message Given:  Yes     Dannette Barbara 11/30/2021, 1:18 PM

## 2021-11-30 NOTE — Evaluation (Signed)
Physical Therapy Evaluation Patient Details Name: Austin Warren MRN: [id number] DOB: 06-Jun-1941 Today's Date: 11/30/2021  History of Present Illness  81 yo male who presented to [facility] ER 05/12 with c/o shortness of breath, has had a hx of shortness of breath over the past few months.  H/o chronic systolic heart failure due to nonischemic cardiomyopathy with recent cardiac cath on 10/30/21 due to continued acute on chronic respiratory failure findings were consistent with non-ischemic cardiomyopathy.  Admitted with possible pneumonia.  Clinical Impression  Pt did quite well with mobility/safety/confidence during ambulation during the effort. Pt on 2L on arrival, sats in the 90-93% range at rest, able to improve to mid 90s with focused breathing effort.  Attempt to ambulate on room air led to precipitous drop in O2 down to mid/low 80s, needed 2 then 3 L to get back into the 90s.  Ultimately he was able to ambulate 250 ft with good safety and confidence but clearly needing O2 to maintain appropriate SpO2 levels.   Pt will benefit from having HHPT once medically cleared for d/c.      Recommendations for follow up therapy are one component of a multi-disciplinary discharge planning process, led by the attending physician.  Recommendations may be updated based on patient status, additional functional criteria and insurance authorization.  Follow Up Recommendations Home health PT    Assistance Recommended at Discharge Set up Supervision/Assistance  Patient can return home with the following  Assistance with cooking/housework;Assist for transportation    Equipment Recommendations None recommended by PT  Recommendations for Other Services       Functional Status Assessment Patient has had a recent decline in their functional status and demonstrates the ability to make significant improvements in function in a reasonable and predictable amount of time. (activity tolerance O2 requirement)      Precautions / Restrictions Precautions Precautions: Fall Restrictions Weight Bearing Restrictions: No      Mobility  Bed Mobility               General bed mobility comments: seated pre and post session    Transfers Overall transfer level: Independent Equipment used: Rolling walker (2 wheels)               General transfer comment: minimal cuing for sequencing and hand placement, able to rise confidently w/o assist    Ambulation/Gait Ambulation/Gait assistance: Supervision Gait Distance (Feet): 250 Feet Assistive device: Rolling walker (2 wheels)         General Gait Details: Good with and able to maintain consistent, essentially baseline, cadence without excessive UE use of walker and no LOBs.  Attempted ambulation on room air with sats quickly dropping to the mid 80s, on 2 L O2 t/o most of ambulation - struggled to maintain SpO2 in the 90s (did briefly bump to 3L when sats were back to mid 80s on 2L.  He reported only mild subjective fatigue and did not have any real SOB despite SpO2 drops.  Stairs            Wheelchair Mobility    Modified Rankin (Stroke Patients Only)       Balance Overall balance assessment: Modified Independent                                           Pertinent Vitals/Pain Pain Assessment Pain Assessment: No/denies pain    Home  Living Family/patient expects to be discharged to:: Private residence Living Arrangements: Spouse/significant other Available Help at Discharge: Family;Available 24 hours/day   Home Access: Stairs to enter Entrance Stairs-Rails: None Entrance Stairs-Number of Steps: 1   Home Layout: One level Home Equipment: Conservation officer, nature (2 wheels);Cane - single point      Prior Function Prior Level of Function : Independent/Modified Independent             Mobility Comments: typically uses walker in the home, SPC out of the home, does not drive but out with wife regularly ADLs  Comments: independent     Hand Dominance        Extremity/Trunk Assessment   Upper Extremity Assessment Upper Extremity Assessment:  (L WFL, R chronic weakness 2/2 CVA 7 years ago,)    Lower Extremity Assessment Lower Extremity Assessment:  (L WFL, R chronic weakness 2/2 CVA 7 years ago)       Communication   Communication: No difficulties  Cognition Arousal/Alertness: Awake/alert Behavior During Therapy: WFL for tasks assessed/performed Overall Cognitive Status: Within Functional Limits for tasks assessed                                          General Comments General comments (skin integrity, edema, etc.): Pt with baseline R sided weakness but is typically able to be quite active, he required O2 today with moderate amount of activity and despite not feeling overly fatigued his O2 dropped with activity and severely when attempting activity w/o O2.    Exercises     Assessment/Plan    PT Assessment Patient needs continued PT services  PT Problem List Decreased strength;Decreased range of motion;Decreased activity tolerance;Decreased balance;Decreased mobility;Decreased knowledge of use of DME;Decreased safety awareness;Cardiopulmonary status limiting activity       PT Treatment Interventions Gait training;Therapeutic activities;DME instruction;Functional mobility training;Therapeutic exercise;Balance training;Patient/family education    PT Goals (Current goals can be found in the Care Plan section)  Acute Rehab PT Goals Patient Stated Goal: go home PT Goal Formulation: With patient Time For Goal Achievement: 12/14/21 Potential to Achieve Goals: Good    Frequency Min 2X/week     Co-evaluation               AM-PAC PT "6 Clicks" Mobility  Outcome Measure Help needed turning from your back to your side while in a flat bed without using bedrails?: None Help needed moving from lying on your back to sitting on the side of a flat bed without  using bedrails?: None Help needed moving to and from a bed to a chair (including a wheelchair)?: None Help needed standing up from a chair using your arms (e.g., wheelchair or bedside chair)?: None Help needed to walk in hospital room?: None Help needed climbing 3-5 steps with a railing? : A Little 6 Click Score: 23    End of Session Equipment Utilized During Treatment: Gait belt;Oxygen (2-3 L) Activity Tolerance: Patient limited by fatigue Patient left: in chair;with call bell/phone within reach;with family/visitor present Nurse Communication: Mobility status (O2 with mobility) PT Visit Diagnosis: Muscle weakness (generalized) (M62.81);Difficulty in walking, not elsewhere classified (R26.2)    Time: 6834-1962 PT Time Calculation (min) (ACUTE ONLY): 27 min   Charges:   PT Evaluation $PT Eval Low Complexity: 1 Low PT Treatments $Gait Training: 8-22 mins        Kreg Shropshire, DPT 11/30/2021, 1:21 PM

## 2021-11-30 NOTE — Progress Notes (Signed)
[place] [facility] [place] NAME: Austin Warren    MR#:  [id number]  DATE OF BIRTH:  May 04, 1941  SUBJECTIVE:    patient denies any chest pain. No family at bedside. Good urine output. Worked with physical therapy earlier.  VITALS:  Blood pressure 121/84, pulse 78, temperature (!) 97.5 F (36.4 C), temperature source Oral, resp. rate 18, height '5\' 6"'$  (1.676 m), weight 74.2 kg, SpO2 98 %.  PHYSICAL EXAMINATION:   GENERAL:  81 y.o.-year-old patient lying in the bed with no acute distress.  LUNGS:decreased breath sounds bilaterally, no wheezing, rales, rhonchi.  CARDIOVASCULAR: S1, S2 normal. No murmurs, rubs, or gallops.  ABDOMEN: Soft, nontender, nondistended. Bowel sounds present.  EXTREMITIES: mild  edema b/l.    NEUROLOGIC: nonfocal  patient is alert and awake   LABORATORY PANEL:  CBC Recent Labs  Lab 11/30/21 0605  WBC 8.8  HGB 14.1  HCT 43.1  PLT 204    Chemistries  Recent Labs  Lab 11/26/21 0457 11/27/21 0436 11/30/21 0605  NA 135   < > 136  K 3.6   < > 4.0  CL 99   < > 101  CO2 21*   < > 27  GLUCOSE 148*   < > 111*  BUN 26*   < > 31*  CREATININE 1.02   < > 0.76  CALCIUM 8.4*   < > 8.1*  MG 2.2   < > 2.4  AST 546*  --   --   ALT 478*  --   --   ALKPHOS 159*  --   --   BILITOT 1.0  --   --    < > = values in this interval not displayed.    Assessment and Plan  81 year old male with a known history of nonobstructive coronary artery disease, persistent atrial fibrillation/flutter, recurrent ventricular tachycardia, chronic HFrEF due to nonischemic cardiomyopathy, hypertension, hyperlipidemia, stroke, and hypothyroidism admitted for multifocal pneumonia   Acute respiratory failure with hypoxia (HCC) acute on chronic diastolic systolic congestive heart failure with pulmonary hypertension\ hepatic congestion/pulmonary edema --Ruled out for PE with CTA chest --Supplemental oxygen to keep sats over 92%-his oxygen  requirement has gone up from 2 L to 4 L now -- continue IV Lasix 20 mg BID. Monitor input output -- CHF education -- per cardiology [facility] MG outpatient EP consult for treatment options for a fib -- off amiodarone --wean off prednisone--not sure why he is on it -- overnight pulse oximetry   Multifocal pneumonia No longer on Abx Antitussives, DuoNebs as needed and supplemental oxygen Pulmonary consult with Dr Mortimer Fries   Hypotension, with history of essential hypertension --Likely medication related.   --wean midodrine down  --bp improving  NICM (nonischemic cardiomyopathy) ([place]) --Had recent right and left heart catheterization, which showed mild, non-obstructive coronary artery disease. Right and left heart filling pressures were mild elevated    Permanent atrial fibrillation (HCC) Continue mexiletene, digoxin and eliquis   History of CVA (cerebrovascular accident) Continue apixaban and atorvastatin   Coronary artery disease involving native coronary artery of native heart without angina pectoris --Recent left heart catheterization, which showed mild, non-obstructive coronary artery disease.  Continue carvedilol, atorvastatin.   Hypothyroidism Continue levothyroxine   History of ventricular tachycardia --Continue  mexiletine.  Patient follows with electrophysiologist, Dr. Delice Lesch     Type 2 diabetes mellitus with other circulatory complications ([place]) Continue sliding scale insulin coverage   PT to work   Family communication :  Consults :[facility] cardiology CODE STATUS: DNR DVT Prophylaxis :eliquis Level of care: Progressive Status is: Inpatient Remains inpatient appropriate because: patient just transfer out of ICU with acute respiratory failure secondary to normal ischemic cardiomyopathy/CHF. Anticipate discharge 1 to 2 days. TOC for d/c planning    TOTAL TIME TAKING CARE OF THIS PATIENT: 30 minutes.  >50% time spent on counselling and coordination of care  Note: This  dictation was prepared with Dragon dictation along with smaller phrase technology. Any transcriptional errors that result from this process are unintentional.  Fritzi Mandes M.D    [facility]   CC: Primary care physician; Kirk Ruths, MD

## 2021-11-30 NOTE — Progress Notes (Signed)
Placed patient on overnight pulse oximetry. Patient is currently on RA with saturation of 96%, HR 73

## 2021-12-01 DIAGNOSIS — I5023 Acute on chronic systolic (congestive) heart failure: Secondary | ICD-10-CM | POA: Diagnosis not present

## 2021-12-01 DIAGNOSIS — J9601 Acute respiratory failure with hypoxia: Secondary | ICD-10-CM | POA: Diagnosis not present

## 2021-12-01 LAB — BASIC METABOLIC PANEL
Anion gap: 10 (ref 5–15)
BUN: 31 mg/dL — ABNORMAL HIGH (ref 8–23)
CO2: 24 mmol/L (ref 22–32)
Calcium: 8 mg/dL — ABNORMAL LOW (ref 8.9–10.3)
Chloride: 99 mmol/L (ref 98–111)
Creatinine, Ser: 0.82 mg/dL (ref 0.61–1.24)
GFR, Estimated: >60 mL/min (ref >60)
Glucose, Bld: 106 mg/dL — ABNORMAL HIGH (ref 70–99)
Potassium: 4.1 mmol/L (ref 3.5–5.1)
Sodium: 133 mmol/L — ABNORMAL LOW (ref 135–145)

## 2021-12-01 LAB — MAGNESIUM: Magnesium: 2.2 mg/dL (ref 1.7–2.4)

## 2021-12-01 LAB — HEPATIC FUNCTION PANEL
ALT: 151 U/L — ABNORMAL HIGH (ref 0–44)
AST: 47 U/L — ABNORMAL HIGH (ref 15–41)
Albumin: 2.3 g/dL — ABNORMAL LOW (ref 3.5–5.0)
Alkaline Phosphatase: 129 U/L — ABNORMAL HIGH (ref 38–126)
Bilirubin, Direct: 0.3 mg/dL — ABNORMAL HIGH (ref 0.0–0.2)
Indirect Bilirubin: 0.8 mg/dL (ref 0.3–0.9)
Total Bilirubin: 1.1 mg/dL (ref 0.3–1.2)
Total Protein: 6.1 g/dL — ABNORMAL LOW (ref 6.5–8.1)

## 2021-12-01 LAB — PHOSPHORUS: Phosphorus: 3.8 mg/dL (ref 2.5–4.6)

## 2021-12-01 LAB — GLUCOSE, CAPILLARY: Glucose-Capillary: 95 mg/dL (ref 70–99)

## 2021-12-01 MED ORDER — TORSEMIDE 20 MG PO TABS
20.0000 mg | ORAL_TABLET | Freq: Every day | ORAL | Status: DC
  Administered 2021-12-01: 20 mg via ORAL
  Filled 2021-12-01: qty 1

## 2021-12-01 MED ORDER — DIGOXIN 125 MCG PO TABS: 0.1250 mg | ORAL_TABLET | Freq: Every day | ORAL | 1 refills | Status: DC

## 2021-12-01 NOTE — Progress Notes (Signed)
Progress Note  Patient Name: Austin Warren Date of Encounter: 12/01/2021  [facility] Cardiologist: Nelva Bush, MD  Subjective   UOP -1.5L. AM labs pending. Patient is asking about going home.   Inpatient Medications    Scheduled Meds:  apixaban  5 mg Oral BID   vitamin C  500 mg Oral Daily   atorvastatin  10 mg Oral Daily   Chlorhexidine Gluconate Cloth  6 each Topical Daily   cholecalciferol  1,000 Units Oral Daily   digoxin  0.125 mg Oral Daily   docusate sodium  100 mg Oral BID   famotidine  20 mg Oral BID   finasteride  5 mg Oral Daily   furosemide  20 mg Intravenous BID   insulin aspart  0-9 Units Subcutaneous TID AC & HS   levothyroxine  100 mcg Oral QAC breakfast   mexiletine  150 mg Oral BID   midodrine  5 mg Oral BID WC   multivitamin with minerals  1 tablet Oral Daily   predniSONE  60 mg Oral Q breakfast   tamsulosin  0.8 mg Oral Daily   Continuous Infusions:  sodium chloride Stopped (11/26/21 0456)   PRN Meds: acetaminophen **OR** acetaminophen, ALPRAZolam, ipratropium-albuterol, ondansetron **OR** ondansetron (ZOFRAN) IV   Vital Signs    Vitals:   11/30/21 1949 11/30/21 2318 12/01/21 0337 12/01/21 0750  BP: 101/61 (!) 100/55 (!) 110/56 (!) 94/58  Pulse: 70 74 74 63  Resp: (!) 23 18 (!) 21 18  Temp: (!) 97.4 F (36.3 C) 97.7 F (36.5 C) 97.7 F (36.5 C) 98.1 F (36.7 C)  TempSrc: Oral Oral Oral   SpO2: 93% 96% 91% 96%  Weight:      Height:        Intake/Output Summary (Last 24 hours) at 12/01/2021 0814 Last data filed at 11/30/2021 1833 Gross per 24 hour  Intake 480 ml  Output 1500 ml  Net -1020 ml      11/29/2021    5:26 AM 11/28/2021    5:00 AM 11/27/2021    5:00 AM  Last 3 Weights  Weight (lbs) 163 lb 9.3 oz 167 lb 15.9 oz 165 lb 12.6 oz  Weight (kg) 74.2 kg 76.2 kg 75.2 kg      Telemetry    Afib/flutter with PVCs, suspect afib with aberrancy- Personally Reviewed  ECG    No new - Personally Reviewed  Physical  Exam   GEN: No acute distress.   Neck: No JVD Cardiac: Irreg Irreg, no murmurs, rubs, or gallops.  Respiratory: diminished at bases GI: Soft, nontender, non-distended  MS: trace lower leg edema; No deformity. Neuro:  Nonfocal  Psych: Normal affect   Labs    High Sensitivity Troponin:   Recent Labs  Lab 11/25/21 1515  TROPONINIHS 32*     Chemistry Recent Labs  Lab 11/25/21 1515 11/26/21 0457 11/27/21 0436 11/28/21 0406 11/29/21 0515 11/30/21 0605 12/01/21 0421  NA 129* 135   < > 136 135 136  --   K 4.1 3.6   < > 4.2 4.5 4.0  --   CL 97* 99   < > 102 102 101  --   CO2 19* 21*   < > '23 25 27  '$ --   GLUCOSE 153* 148*   < > 127* 120* 111*  --   BUN 24* 26*   < > 32* 31* 31*  --   CREATININE 0.90 1.02   < > 1.01 0.93 0.76  --  CALCIUM 8.2* 8.4*   < > 8.1* 7.9* 8.1*  --   MG 2.2 2.2   < > 2.2 2.2 2.4 2.2  PROT 7.4 7.2  --   --   --   --  6.1*  ALBUMIN 2.7* 2.6*  --   --   --   --  2.3*  AST 31 546*  --   --   --   --  47*  ALT 19 478*  --   --   --   --  151*  ALKPHOS 165* 159*  --   --   --   --  129*  BILITOT 1.4* 1.0  --   --   --   --  1.1  GFRNONAA >60 >60   < > >60 >60 >60  --   ANIONGAP 13 15   < > '11 8 8  '$ --    < > = values in this interval not displayed.    Lipids No results for input(s): CHOL, TRIG, HDL, LABVLDL, LDLCALC, CHOLHDL in the last 168 hours.  Hematology Recent Labs  Lab 11/28/21 0406 11/29/21 0515 11/30/21 0605  WBC 15.0* 11.8* 8.8  RBC 4.53 4.17* 4.44  HGB 14.3 13.4 14.1  HCT 43.3 41.0 43.1  MCV 95.6 98.3 97.1  MCH 31.6 32.1 31.8  MCHC 33.0 32.7 32.7  RDW 14.0 13.7 13.7  PLT 255 205 204   Thyroid  Recent Labs  Lab 11/25/21 1903  TSH 4.360    BNP Recent Labs  Lab 11/24/21 1020  BNP 742.7*    DDimer No results for input(s): DDIMER in the last 168 hours.   Radiology    No results found.  Cardiac Studies   Right/Left Heart Cath and Coronary Angiography completed 10/30/2021 Mild plaquing of LCx and RCA with up to 20%  narrowing.  No significant stenosis observed to explain reduced LVEF.  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 Mildly elevated left heart, right heart, and pulmonary artery pressures. Mildly reduced Fick cardiac output/index.   Limited Echocardiogram completed on 11/29/2021 1. Left ventricular ejection fraction, by estimation, is 25 to 30%. The  left ventricle has severely decreased function. The left ventricle  demonstrates severe global hypokinesis (basal to mid anterior/anteroseptal  wall motion best preserved . The left  ventricular internal cavity size was mildly dilated. Left ventricular  diastolic parameters are indeterminate.   2. Right ventricular systolic function is mildly reduced. There is  moderately elevated pulmonary artery systolic pressure. The estimated  right ventricular systolic pressure is 81.8 mmHg.   3. Left atrial size was moderately dilated.   4. Moderate mitral valve regurgitation.   5. Aortic valve regurgitation is mild to moderate. Aortic valve sclerosis  is present, with no evidence of aortic valve stenosis.   6. The inferior vena cava is dilated in size with >50% respiratory  variability, suggesting right atrial pressure of 8 mmHg.   Patient Profile     81 y.o. 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  Assessment & Plan    Acute on chronic combined systolic and diastolic CHF NICM - R/L heart cath showed nonobstructive CAD with not significantly elevated right heart pressures - IV lasix '20mg'$  BID - Net -8.9L - previuosly requiring pressors, now on midodrine '5mg'$  BID>wean as able -  Home coreg and spiro were held - Bps intermittently  soft - He remains on 2L O2>>wean as able - appears euvolemic on exam, can possible switch to po diuretic  Permanent Afib/flutter - generally rate controlled Afib - digoxin - BB held for hypotension - Continue Eliquis '5mg'$  BID  H/o VT - tele shows suspected afib with aberrancy overnight - continue PTA mexiletine - PTA amiodarone held - keep Mag>2 and K>4 - continue EP follow-up  Multifocal PNA - abx per IM    For questions or updates, please contact [facility] Please consult [email or web address] for contact info under        Signed, Deira Shimer Ninfa Meeker, PA-C  12/01/2021, 8:14 AM

## 2021-12-01 NOTE — TOC Transition Note (Addendum)
Transition of Care [facility]) - CM/SW Discharge Note   Patient Details  Name: Austin Warren MRN: [id number] Date of Birth: [birth year]  Transition of Care [facility] CM/SW Contact:  Laurena Slimmer, RN Phone Number: 12/01/2021, 11:59 AM   Clinical Narrative:    Spoke with patient wife regarding referral for [facility]. Referral unable to accommodate for nursing. Patient wife refused service. Patient will follow up PCP on appointment scheduled for 5/23. TOC signing off.      Barriers to Discharge: Continued Medical Work up   Patient Goals and CMS Choice Patient states their goals for this hospitalization and ongoing recovery are:: patient wants to get well and get back home      Discharge Placement                       Discharge Plan and Services   Discharge Planning Services: CM Consult                                 Social Determinants of Health (SDOH) Interventions     Readmission Risk Interventions     View : No data to display.

## 2021-12-01 NOTE — Discharge Summary (Signed)
Physician Discharge Summary   Patient: Austin Warren MRN: [id number] DOB: 03-11-1941  Admit date:     11/24/2021  Discharge date: 12/01/21  Discharge Physician: Fritzi Mandes   PCP: Kirk Ruths, MD   Recommendations at discharge:   follow-up PCP on your scheduled appointment follow-up cardiology Dr.End on your appointment follow-up pulmonary Dr. 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   Hospital Course:  81 year old male with a known history of nonobstructive coronary artery disease, persistent atrial fibrillation/flutter, recurrent ventricular tachycardia, chronic HFrEF due to nonischemic cardiomyopathy, hypertension, hyperlipidemia, stroke, and hypothyroidism admitted for multifocal pneumonia   Acute respiratory failure with hypoxia (Austin Warren) acute on chronic diastolic systolic congestive heart failure with pulmonary hypertension\ hepatic congestion/pulmonary edema --Ruled out for PE with CTA chest --Supplemental oxygen to keep sats over 92%-his oxygen requirement has gone up from 2 L to 4 L now -- continue IV Lasix 20 Warren BID. Monitor input output -- CHF education -- per cardiology Austin Warren outpatient EP consult for treatment options for a fib -- off amiodarone --wean off prednisone--not sure why he is on it -- overnight pulse oximetry   Multifocal pneumonia No longer on Abx Antitussives, DuoNebs as needed and supplemental oxygen Pulmonary consult with Dr Mortimer Fries   Hypotension, with history of essential hypertension --Likely medication related.   --wean midodrine down  --bp improving   NICM (nonischemic cardiomyopathy) [name] --Had recent right and left heart catheterization, which showed mild, non-obstructive coronary artery disease. Right and left heart filling pressures were mild elevated    Permanent atrial  fibrillation (Austin Warren) Continue mexiletene, digoxin and eliquis    History of CVA (cerebrovascular accident) Continue apixaban and atorvastatin   Coronary artery disease involving native coronary artery of native heart without angina pectoris --Recent left heart catheterization, which showed mild, non-obstructive coronary artery disease.  Continue carvedilol, atorvastatin.   Hypothyroidism Continue levothyroxine   History of ventricular tachycardia --Continue  mexiletine.  Patient follows with electrophysiologist, Dr. Delice Lesch     Type 2 diabetes mellitus with other circulatory complications (Austin Warren) Continue sliding scale insulin coverage        Consultants: see [facility] cardiology  Disposition: Home health Diet recommendation:  Discharge Diet Orders (From admission, onward)     Start     Ordered   12/01/21 0000  Diet - low sodium heart healthy        12/01/21 0903           Cardiac diet DISCHARGE MEDICATION: Allergies as of 12/01/2021       Reactions   Ciprofloxacin Anaphylaxis   Levofloxacin Anaphylaxis   "throat closes"   Amiodarone Other (See Comments)   Pulmonary toxicity   Enalapril Maleate Swelling   Angioedema   Metoprolol Swelling   edema   Sulfa Antibiotics Rash, Other (See Comments)   Hypotension        Medication List     STOP taking these medications    carvedilol 3.125 Warren tablet Commonly known as: COREG   spironolactone 25 Warren tablet Commonly known as: ALDACTONE       TAKE these medications    apixaban 5 Warren Tabs tablet Commonly known as: Eliquis Take 1 tablet (5 Warren total) by mouth every 12 (twelve) hours.   atorvastatin 10 Warren tablet Commonly known as: LIPITOR Take 10 Warren by mouth daily.   digoxin 0.125 Warren tablet Commonly known as: LANOXIN Take 1 tablet (  0.125 Warren total) by mouth daily.   docusate sodium 50 Warren capsule Commonly known as: COLACE Take 50 Warren by mouth 2 (two) times daily.   finasteride 5 Warren tablet Commonly known as:  PROSCAR TAKE 1 TABLET BY MOUTH DAILY   levothyroxine 100 MCG tablet Commonly known as: SYNTHROID Take 1 tablet (100 mcg total) by mouth daily before breakfast.   mexiletine 150 Warren capsule Commonly known as: MEXITIL TAKE 1 CAPSULE BY MOUTH TWICE DAILY   multivitamin with minerals Tabs tablet Take 1 tablet by mouth daily. One A Day 50+   tamsulosin 0.4 Warren Caps capsule Commonly known as: FLOMAX TAKE 2 CAPSULES BY MOUTH DAILY   torsemide 10 Warren tablet Commonly known as: DEMADEX Take 20 Warren by mouth daily.   vitamin C 500 Warren tablet Commonly known as: ASCORBIC ACID Take 500 Warren by mouth daily.   Vitamin D3 25 MCG (1000 UT) Caps Take 1,000 Units by mouth daily.               Durable Medical Equipment  (From admission, onward)           Start     Ordered   12/01/21 0806  For home use only DME oxygen  Once       Question Answer Comment  Length of Need Lifetime   Mode or (Route) Nasal cannula   Liters per Minute 2   Frequency Continuous (stationary and portable oxygen unit needed)   Oxygen conserving device Yes   Oxygen delivery system Gas      12/01/21 0806            Follow-up Information     Kirk Ruths, MD. Go to.   Specialty: Internal Medicine Why: on your appt Contact information: [place] [address] [phone]         Nelva Bush, MD .   Specialty: Cardiology Contact information: [place] [address] [phone]         Flora Lipps, MD. Schedule an appointment as soon as possible for a visit in 2 week(s).   Specialties: Pulmonary Disease, Cardiology Why: respiratory iialure f/u Contact information: [place]  [phone]                Discharge Exam: Danley Danker Weights   11/27/21 0500 11/28/21 0500 11/29/21 0526  Weight: 75.2 kg 76.2 kg 74.2 kg     Condition at discharge: fair  The  results of significant diagnostics from this hospitalization (including imaging, microbiology, ancillary and laboratory) are listed below for reference.   Imaging Studies: DG Chest 2 View  Result Date: 11/27/2021 CLINICAL DATA:  Shortness of breath. History of atrial fibrillation. EXAM: CHEST - 2 VIEW COMPARISON:  April 07, 2019 chest x-ray. CT scan of the chest Nov 24, 2021. FINDINGS: Bilateral patchy pulmonary infiltrates are identified, new since 2020, right greater than left. No change in the cardiomediastinal silhouette. No pneumothorax. No other acute abnormalities. IMPRESSION: Bilateral patchy pulmonary infiltrates are identified consistent with multifocal pneumonia. The patient has had a CT pulmonary angiogram since the time of this chest x-ray confirming multifocal infiltrates. Recommend follow-up imaging to ensure resolution. Electronically Signed   By: Dorise Bullion III M.D.   On: 11/27/2021 15:47   CT Angio Chest PE W and/or Wo Contrast  Result Date: 11/24/2021 CLINICAL DATA:  Short of breath for 4 days, central chest pain EXAM:  CT ANGIOGRAPHY CHEST WITH CONTRAST TECHNIQUE: Multidetector CT imaging of the chest was performed using the standard protocol during bolus administration of intravenous contrast. Multiplanar CT image reconstructions and MIPs were obtained to evaluate the vascular anatomy. RADIATION DOSE REDUCTION: This exam was performed according to the departmental dose-optimization program which includes automated exposure control, adjustment of the mA and/or kV according to patient size and/or use of iterative reconstruction technique. CONTRAST:  18m OMNIPAQUE IOHEXOL 350 Warren/ML SOLN COMPARISON:  09/10/2019 FINDINGS: Cardiovascular: There is technically adequate opacification of the pulmonary vasculature. Evaluation of the lower lobe pulmonary arterial branches is limited due to respiratory motion artifact. No filling defects or pulmonary emboli. The heart is enlarged without  pericardial effusion. Significant reflux of contrast into the hepatic veins consistent with cardiac dysfunction. 4.8 cm ascending thoracic aortic aneurysm. Evaluation of the vascular lumen is limited by timing of contrast bolus. There is diffuse aortic atherosclerosis. Mediastinum/Nodes: Thyroid, trachea, and esophagus are unremarkable. Multiple borderline enlarged mediastinal and hilar lymph nodes are identified. Largest lymph node in the precarinal station measures 15 mm in short axis. Lungs/Pleura: There is bilateral multifocal airspace disease most pronounced at the right lung base. Consolidation within the remaining portions of the lungs has a somewhat nodular configuration. No evidence of cavitation. Trace right pleural fluid. No pneumothorax. Central airways are patent. Upper Abdomen: No acute abnormality. Musculoskeletal: No acute or destructive bony lesions. Chronic bilateral rib fractures are noted. Reconstructed images demonstrate no additional findings. Review of the MIP images confirms the above findings. IMPRESSION: 1. No evidence of pulmonary embolus. 2. Bilateral multifocal airspace disease, with greatest consolidation seen at the right lung base. Findings favor multifocal bilateral pneumonia over edema. 3. Likely reactive mediastinal lymphadenopathy. 4. Marked cardiomegaly. 5. 4.8 cm ascending thoracic aortic aneurysm, previously measuring 4.6 cm by my measurement. Recommend semi-annual imaging followup by CTA or MRA and referral to cardiothoracic surgery if not already obtained. This recommendation follows 2010 ACCF/AHA/AATS/ACR/ASA/SCA/SCAI/SIR/STS/SVM Guidelines for the Diagnosis and Management of Patients With Thoracic Aortic Disease. Circulation. 2010; 121:: Z610-R604 Aortic aneurysm NOS (ICD10-I71.9) 6.  Aortic Atherosclerosis (ICD10-I70.0). Electronically Signed   By: MRanda NgoM.D.   On: 11/24/2021 20:02   DG Chest Port 1 View  Result Date: 11/25/2021 CLINICAL DATA:  Acute  respiratory failure, hypoxia EXAM: PORTABLE CHEST 1 VIEW COMPARISON:  Previous studies including the examination done on 11/24/2021 FINDINGS: Transverse diameter of heart is increased. Central pulmonary vessels are more prominent. Extensive patchy infiltrates are seen in both lungs, more so on the right side with interval worsening. There is blunting of lateral CP angles. There is no pneumothorax. IMPRESSION: Cardiomegaly. Central pulmonary vessels are more prominent suggesting CHF. There is interval worsening of extensive patchy interstitial and alveolar infiltrates in both lungs, more so on the right side suggesting worsening of pulmonary edema or worsening of multifocal pneumonia. Blunting of lateral CP angles suggests small pleural effusions. Electronically Signed   By: PElmer PickerM.D.   On: 11/25/2021 15:48   ECHOCARDIOGRAM LIMITED  Result Date: 11/26/2021    ECHOCARDIOGRAM LIMITED REPORT   Patient Name:   Austin HUSMANNDate of Exam: 11/26/2021 Medical Rec #:  [id number]        Height:       66.0 in Accession #:    [id number]       Weight:       170.4 lb Date of Birth:  [birth year]        BSA:  1.868 m Patient Age:    5 years          BP:           102/71 mmHg Patient Gender: M                 HR:           69 bpm. Exam Location:  Inpatient Procedure: Limited Echo, 3D Echo, Cardiac Doppler and Color Doppler Indications:     Atrial Fibrillation I48.91  History:         Patient has prior history of Echocardiogram examinations, most                  recent 10/19/2021. CAD, Stroke, Arrythmias:ventricular                  tachycardia; Risk Factors:Diabetes. Acute respiratory failure                  with hypoxia, pneumonia. Hypotension, with history of essential                  hypertension. Heart failure with reduced ejection fraction.                  Nonischemic cardiomyopathy.  Sonographer:     Darlina Sicilian RDCS Referring Phys:  [id number] ADAM ROSS SCHERTZ Diagnosing Phys: Ida Rogue  MD IMPRESSIONS  1. Left ventricular ejection fraction, by estimation, is 25 to 30%. The left ventricle has severely decreased function. The left ventricle demonstrates severe global hypokinesis (basal to mid anterior/anteroseptal wall motion best preserved . The left ventricular internal cavity size was mildly dilated. Left ventricular diastolic parameters are indeterminate.  2. Right ventricular systolic function is mildly reduced. There is moderately elevated pulmonary artery systolic pressure. The estimated right ventricular systolic pressure is 85.4 mmHg.  3. Left atrial size was moderately dilated.  4. Moderate mitral valve regurgitation.  5. Aortic valve regurgitation is mild to moderate. Aortic valve sclerosis is present, with no evidence of aortic valve stenosis.  6. The inferior vena cava is dilated in size with >50% respiratory variability, suggesting right atrial pressure of 8 mmHg. FINDINGS  Left Ventricle: Left ventricular ejection fraction, by estimation, is 25 to 30%. The left ventricle has severely decreased function. The left ventricle demonstrates global hypokinesis. The left ventricular internal cavity size was mildly dilated. Left ventricular diastolic parameters are indeterminate. Right Ventricle: Right ventricular systolic function is mildly reduced. There is moderately elevated pulmonary artery systolic pressure. The tricuspid regurgitant velocity is 3.34 m/s, and with an assumed right atrial pressure of 8 mmHg, the estimated right ventricular systolic pressure is 62.7 mmHg. Left Atrium: Left atrial size was moderately dilated. Mitral Valve: Moderate mitral valve regurgitation. Tricuspid Valve: Tricuspid valve regurgitation is mild. Aortic Valve: Aortic valve regurgitation is mild to moderate. Aortic regurgitation PHT measures 549 msec. Aortic valve sclerosis is present, with no evidence of aortic valve stenosis. Venous: The inferior vena cava is dilated in size with greater than 50%  respiratory variability, suggesting right atrial pressure of 8 mmHg. LEFT VENTRICLE PLAX 2D LVIDd:         5.50 cm   Diastology LVIDs:         4.90 cm   LV e' medial:    4.43 cm/s LV PW:         1.20 cm   LV E/e' medial:  23.8 LV IVS:        1.30 cm   LV e'  lateral:   5.74 cm/s LVOT diam:     2.10 cm   LV E/e' lateral: 18.4 LV SV:         49 LV SV Index:   26 LVOT Area:     3.46 cm                           3D Volume EF:                          3D EF:        23 %                          LV EDV:       173 ml                          LV ESV:       132 ml                          LV SV:        41 ml LEFT ATRIUM         Index LA diam:    5.20 cm 2.78 cm/m  AORTIC VALVE LVOT Vmax:   81.50 cm/s LVOT Vmean:  53.900 cm/s LVOT VTI:    0.142 m AI PHT:      549 msec MITRAL VALVE                  TRICUSPID VALVE MV Area (PHT): 4.27 cm       TR Peak grad:   44.6 mmHg MV Decel Time: 178 msec       TR Vmax:        334.00 cm/s MR Peak grad:    83.5 mmHg MR Mean grad:    54.0 mmHg    SHUNTS MR Vmax:         457.00 cm/s  Systemic VTI:  0.14 m MR Vmean:        346.0 cm/s   Systemic Diam: 2.10 cm MR PISA:         1.57 cm MR PISA Eff ROA: 13 mm MR PISA Radius:  0.50 cm MV E velocity: 105.50 cm/s Ida Rogue MD Electronically signed by Ida Rogue MD Signature Date/Time: 11/26/2021/12:55:09 PM    Final    US Abdomen Limited RUQ (LIVER/GB)  Result Date: 11/26/2021 CLINICAL DATA:  Elevated liver function tests EXAM: ULTRASOUND ABDOMEN LIMITED RIGHT UPPER QUADRANT COMPARISON:  CT a chest, 11/24/2021.  CT abdomen pelvis, 01/31/2015. FINDINGS: Gallbladder: No gallstones or wall thickening visualized. No sonographic Murphy sign noted by sonographer. Common bile duct: Diameter: 3 mm Liver: Heterogeneous with overall increased parenchymal echogenicity. No defined mass. Portal vein is patent on color Doppler imaging with normal direction of blood flow towards the liver. Other: None. IMPRESSION: 1. No acute findings.  Normal  gallbladder.  No bile duct dilation. 2. Heterogeneous increased liver parenchymal echogenicity. Findings suggest cirrhosis. No liver mass. Electronically Signed   By: Lajean Manes M.D.   On: 11/26/2021 10:22    Microbiology: Results for orders placed or performed during the hospital encounter of 11/24/21  Resp Panel by RT-PCR (Flu A&B, Covid) Nasopharyngeal Swab     Status: None   Collection Time: 11/24/21 11:48 PM   Specimen: Nasopharyngeal Swab; Nasopharyngeal(NP) swabs  in vial transport medium  Result Value Ref Range Status   SARS Coronavirus 2 by RT PCR NEGATIVE NEGATIVE Final    Comment: (NOTE) SARS-CoV-2 target nucleic acids are NOT DETECTED.  The SARS-CoV-2 RNA is generally detectable in upper respiratory specimens during the acute phase of infection. The lowest concentration of SARS-CoV-2 viral copies this assay can detect is 138 copies/mL. A negative result does not preclude SARS-Cov-2 infection and should not be used as the sole basis for treatment or other patient management decisions. A negative result may occur with  improper specimen collection/handling, submission of specimen other than nasopharyngeal swab, presence of viral mutation(s) within the areas targeted by this assay, and inadequate number of viral copies(<138 copies/mL). A negative result must be combined with clinical observations, patient history, and epidemiological information. The expected result is Negative.  Fact Sheet for Patients:  EntrepreneurPulse.com.au  Fact Sheet for Healthcare Providers:  IncredibleEmployment.be  This test is no t yet approved or cleared by the [place] FDA and  has been authorized for detection and/or diagnosis of SARS-CoV-2 by FDA under an Emergency Use Authorization (EUA). This EUA will remain  in effect (meaning this test can be used) for the duration of the COVID-19 declaration under Section 564(b)(1) of the Act, 21 U.S.C.section  360bbb-3(b)(1), unless the authorization is terminated  or revoked sooner.       Influenza A by PCR NEGATIVE NEGATIVE Final   Influenza B by PCR NEGATIVE NEGATIVE Final    Comment: (NOTE) The Xpert Xpress SARS-CoV-2/FLU/RSV plus assay is intended as an aid in the diagnosis of influenza from Nasopharyngeal swab specimens and should not be used as a sole basis for treatment. Nasal washings and aspirates are unacceptable for Xpert Xpress SARS-CoV-2/FLU/RSV testing.  Fact Sheet for Patients: EntrepreneurPulse.com.au  Fact Sheet for Healthcare Providers: IncredibleEmployment.be  This test is not yet approved or cleared by the [place] FDA and has been authorized for detection and/or diagnosis of SARS-CoV-2 by FDA under an Emergency Use Authorization (EUA). This EUA will remain in effect (meaning this test can be used) for the duration of the COVID-19 declaration under Section 564(b)(1) of the Act, 21 U.S.C. section 360bbb-3(b)(1), unless the authorization is terminated or revoked.  Performed at [facility], [address] [place], [place] [address]   MRSA Next Gen by PCR, Nasal     Status: None   Collection Time: 11/25/21  7:08 PM   Specimen: Nasal Mucosa; Nasal Swab  Result Value Ref Range Status   MRSA by PCR Next Gen NOT DETECTED NOT DETECTED Final    Comment: (NOTE) The GeneXpert MRSA Assay (FDA approved for NASAL specimens only), is one component of a comprehensive MRSA colonization surveillance program. It is not intended to diagnose MRSA infection nor to guide or monitor treatment for MRSA infections. Test performance is not FDA approved in patients less than 62 years old. Performed at [facility], [address] [place], [place] [address]     Labs: CBC: Recent Labs  Lab 11/24/21 1920 11/25/21 1527 11/26/21 0457 11/27/21 0436 11/28/21 0406 11/29/21 0515 11/30/21 0605  WBC 10.7* 13.0* 16.7*  18.0* 15.0* 11.8* 8.8  NEUTROABS 7.2 9.6*  --   --   --   --   --   HGB 15.8 15.3 15.4 14.0 14.3 13.4 14.1  HCT 47.5 46.4 45.5 41.5 43.3 41.0 43.1  MCV 95.4 96.9 94.6 95.4 95.6 98.3 97.1  PLT 289 324 325 269 255 205 097   Basic Metabolic  Panel: Recent Labs  Lab 11/26/21 0457 11/27/21 0436 11/28/21 0406 11/29/21 0515 11/30/21 0605 12/01/21 0421  NA 135 133* 136 135 136  --   K 3.6 3.1* 4.2 4.5 4.0  --   CL 99 100 102 102 101  --   CO2 21* '24 23 25 27  '$ --   GLUCOSE 148* 142* 127* 120* 111*  --   BUN 26* 30* 32* 31* 31*  --   CREATININE 1.02 0.87 1.01 0.93 0.76  --   CALCIUM 8.4* 7.8* 8.1* 7.9* 8.1*  --   Warren 2.2 2.1 2.2 2.2 2.4 2.2  PHOS 5.1* 3.0 2.9 2.8 4.0 3.8   Liver Function Tests: Recent Labs  Lab 11/24/21 1020 11/25/21 1515 11/26/21 0457 12/01/21 0421  AST 27 31 546* 47*  ALT 19 19 478* 151*  ALKPHOS 165* 165* 159* 129*  BILITOT 1.5* 1.4* 1.0 1.1  PROT 7.4 7.4 7.2 6.1*  ALBUMIN 2.9* 2.7* 2.6* 2.3*   CBG: Recent Labs  Lab 11/30/21 0829 11/30/21 1106 11/30/21 1637 11/30/21 2102 12/01/21 0751  GLUCAP 94 160* 207* 180* 95    Discharge time spent: greater than 30 minutes.  Signed: Fritzi Mandes, MD [facility] 12/01/2021

## 2021-12-01 NOTE — Discharge Instructions (Signed)
Use oxygen 2L/min continuous

## 2021-12-01 NOTE — Progress Notes (Signed)
SATURATION QUALIFICATIONS: (This note is used to comply with regulatory documentation for home oxygen)  Patient Saturations on Room Air at Rest = 95%  Patient Saturations on Room Air while Ambulating = 84%  Patient Saturations on 2 Liters of oxygen while Ambulating = 93%  Please briefly explain why patient needs home oxygen: hypoxia

## 2021-12-07 ENCOUNTER — Encounter: Payer: Self-pay | Admitting: Internal Medicine

## 2021-12-07 ENCOUNTER — Ambulatory Visit: Payer: Medicare Other | Admitting: Internal Medicine

## 2021-12-07 VITALS — BP 113/73 | HR 68 | Ht 66.0 in | Wt 159.0 lb

## 2021-12-07 DIAGNOSIS — R0602 Shortness of breath: Secondary | ICD-10-CM

## 2021-12-07 DIAGNOSIS — I472 Ventricular tachycardia, unspecified: Secondary | ICD-10-CM

## 2021-12-07 DIAGNOSIS — I428 Other cardiomyopathies: Secondary | ICD-10-CM

## 2021-12-07 DIAGNOSIS — I4821 Permanent atrial fibrillation: Secondary | ICD-10-CM

## 2021-12-07 DIAGNOSIS — R001 Bradycardia, unspecified: Secondary | ICD-10-CM

## 2021-12-07 DIAGNOSIS — I5022 Chronic systolic (congestive) heart failure: Secondary | ICD-10-CM | POA: Diagnosis not present

## 2021-12-07 MED ORDER — CARVEDILOL 3.125 MG PO TABS: 3.1250 mg | ORAL_TABLET | Freq: Two times a day (BID) | ORAL | Status: DC

## 2021-12-07 MED ORDER — SPIRONOLACTONE 25 MG PO TABS: 12.5000 mg | ORAL_TABLET | Freq: Every day | ORAL | Status: DC

## 2021-12-07 NOTE — Progress Notes (Signed)
Patient Care Team: Kirk Ruths, MD as PCP - General (Unknown Physician Specialty) End, Harrell Gave, MD as PCP - Cardiology (Cardiology)   HPI  Austin Warren is a 81 y.o. male Seen in followup for a ventricular tachycardia with a right bundle superior axis relatively narrow QRS complex and positive concordance suggesting a septal origin and possible verapamil sensitivity.   Also recurrent atrial fibrillation for which underwent cardioversion (1/23)  Work-up for his ventricular tachycardia demonstrated normal LV function no obstructive coronary disease  Signal average Electrocardiogram was markedly abnormal.  MRI 4/16>>There is no significant change from the prior study with persistent basal inferior and inferolateral late gadolinium enhancement slightly more pronounced on the current study and a small focal late gadolinium enhancement at the point of inferior RV attachment to the LV wall.  He also underwent fat pad biopsy>> neg    He had recurrent ventricular tachycardia. He was put on verapamil.  5/17 he was transferred to [facility] in [place] storm. Evaluation included an echo with EF of 40-45%. Cardiac MR demonstrated EF of 43% with inferolateral and apical hypokinesis with inferolateral basilar scar which was if anything not worse compared to prior MR 4/16. He underwent EP testing. No scar was found and no ablation undertaken. Ventricular flutter was inducible. VT  was not. There have been discussions regarding ICD implantation. He elected to defer that decision  Discharge off amiod 2/2 bradycardi Had recurrent tachycardia >>>  started on mexiletine and resumed amiodarone after hiatus of years -- amiodarone has been on and off because of bradycardia He  had  stroke  11/15. He was found to be in atrial fibrillation. He was treated with TPA. Apixaban was initiated.  He has intellectual consequences and aphasia;   Underwent cardioversion with Amio 1/23 and failed to hold sinus>> and no clear  change in his symptoms so designated PERMANENT  3/23  Interval care with Dr CE for AHF  W/U as below without significant improvement and then hospitalization 5/23 for respiratory insufficiency with the presumption of multilobar pneumonia versus amiodarone lung toxicity prompting the discontinuation of the latter; notably heart rates were controlled while in hospital; discharged on O2   Feeling much better indeed better than he was when he first presented 4/23; still with some edema.     DATE TEST EF   4/12 LHC  No obst CAD  5/17 cMRI 43% Inferolateral subendo scar  2/21 CTA   Ca score 881 LADm-severe--FFR> 0.8  2/21 Echo  30-35%   4/23 Echo  20-25%   4/23  LHC  No obstructive CAD       Date Cr K TSH Hgb LFTs PFTs  6/17  0.94 3.9 2.8  25    8/18 0.9 4.0 7.5  16   1/19   4.99     2/19 0.8 4.0 3.9  18   8/19 0.86 4.6  15.5    2/20 0.9 4.0 4.87  22   10/20 1.08 4.7 6.7 16.5 23   3/21 0.9 4.4 4.55  21   9/21 0.8 4.5 5.22  21   11/22 1.0 4.3 5.22  26             Thromboembolic risk factors ( age  -2, HTN-1, TIA/CVA-2, Vasc disease -1, CHF-1) for a CHADSVASc Score of >=7  Antiarrhythmics Date Reason stopped  amiodarone  5/23 ? Lung toxicity  mexiletine          Past Medical History:  Diagnosis Date   Arthritis  Benign prostatic hypertrophy    Bursitis of left shoulder    CHF (congestive heart failure) (HCC)    Coronary artery disease 08/2019   Moderate multivessel CAD (not hemodynamically significant by CT-FFR)   Diabetes (HCC)    Dyspnea on exertion 11/01/2011   Elevated PSA    History of colon polyps    History of colon polyps    Hyperlipidemia    Hypertension    Melanoma ([place])    under arm   Microscopic hematuria    Osteoarthritis    Retroperitoneal mass    Stroke ([place]) 05/2014   TIA (transient ischemic attack)    Urinary retention    UTI (urinary tract infection)    Ventricular tachycardia (HCC)    RBB/LAHB ideopathic VT, 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     Past Surgical History:  Procedure Laterality Date   CARDIOVERSION     CARDIOVERSION N/A 03/07/2018   Procedure: CARDIOVERSION;  Surgeon: Wellington Hampshire, MD;  Location: [facility];  Service: Cardiovascular;  Laterality: N/A;   CARDIOVERSION N/A 04/06/2019   Procedure: CARDIOVERSION;  Surgeon: Wellington Hampshire, MD;  Location: [place] [facility];  Service: Cardiovascular;  Laterality: N/A;   CARDIOVERSION N/A 06/02/2019   Procedure: CARDIOVERSION;  Surgeon: Nelva Bush, MD;  Location: [place] [facility];  Service: Cardiovascular;  Laterality: N/A;   CARDIOVERSION N/A 07/25/2021   Procedure: CARDIOVERSION;  Surgeon: Nelva Bush, MD;  Location: [facility];  Service: Cardiovascular;  Laterality: N/A;   COLONOSCOPY WITH PROPOFOL N/A 10/17/2015   Procedure: COLONOSCOPY WITH PROPOFOL;  Surgeon: Manya Silvas, MD;  Location: [place];  Service: Endoscopy;  Laterality: N/A;   COLONOSCOPY WITH PROPOFOL N/A 03/17/2019   Procedure: COLONOSCOPY WITH PROPOFOL;  Surgeon: Toledo, Benay Pike, MD;  Location: [facility];  Service: Gastroenterology;  Laterality: N/A;   ELECTROPHYSIOLOGIC STUDY N/A 12/07/2015   Procedure: V Tach Ablation;  Surgeon: Will Guereca Leeds, MD;  Location: [place] [facility];  Service: Cardiovascular;  Laterality: N/A;   JOINT REPLACEMENT     R TKR   RADIOLOGY WITH ANESTHESIA N/A 05/18/2014   Procedure: RADIOLOGY WITH ANESTHESIA;  Surgeon: Rob Hickman, MD;  Location: [place];  Service: Radiology;  Laterality: N/A;   REPLACEMENT TOTAL KNEE     REPLACEMENT TOTAL KNEE Left    RIGHT/LEFT HEART CATH AND CORONARY ANGIOGRAPHY N/A 10/30/2021   Procedure: RIGHT/LEFT HEART CATH AND CORONARY ANGIOGRAPHY;  Surgeon: Nelva Bush, MD;  Location: [place] [facility];  Service: Cardiovascular;  Laterality: N/A;   VASECTOMY      Current Outpatient Medications  Medication Sig Dispense Refill   apixaban (ELIQUIS) 5 MG TABS tablet Take 1 tablet (5 mg total) by mouth every 12 (twelve) hours. 180  tablet 1   Ascorbic Acid (VITAMIN C) 500 MG tablet Take 500 mg by mouth daily.       atorvastatin (LIPITOR) 10 MG tablet Take 10 mg by mouth daily.     Cholecalciferol (VITAMIN D3) 1000 UNITS CAPS Take 1,000 Units by mouth daily.      digoxin (LANOXIN) 0.125 MG tablet Take 1 tablet (0.125 mg total) by mouth daily. 30 tablet 1   docusate sodium (COLACE) 50 MG capsule Take 50 mg by mouth 2 (two) times daily.     finasteride (PROSCAR) 5 MG tablet TAKE 1 TABLET BY MOUTH DAILY 90 tablet 3   fluconazole (DIFLUCAN) 100 MG tablet Take 100 mg by mouth daily.     levothyroxine (SYNTHROID) 100 MCG tablet Take 1 tablet (100 mcg total) by  mouth daily before breakfast. 30 tablet 2   mexiletine (MEXITIL) 150 MG capsule TAKE 1 CAPSULE BY MOUTH TWICE DAILY 180 capsule 0   Multiple Vitamin (MULTIVITAMIN WITH MINERALS) TABS tablet Take 1 tablet by mouth daily. One A Day 50+     tamsulosin (FLOMAX) 0.4 MG CAPS capsule TAKE 2 CAPSULES BY MOUTH DAILY 180 capsule 3   torsemide (DEMADEX) 10 MG tablet Take 20 mg by mouth daily.     No current facility-administered medications for this visit.    Allergies  Allergen Reactions   Ciprofloxacin Anaphylaxis   Levofloxacin Anaphylaxis    "throat closes"   Amiodarone Other (See Comments)    Pulmonary toxicity   Enalapril Maleate Swelling    Angioedema   Metoprolol Swelling    edema   Sulfa Antibiotics Rash and Other (See Comments)    Hypotension    Review of Systems negative except from HPI and PMH  Physical Exam BP 113/73 (BP Location: Right Arm, Patient Position: Sitting, Cuff Size: Large)   Pulse 68   Ht '5\' 6"'$  (1.676 m)   Wt 159 lb (72.1 kg)   SpO2 98%   BMI 25.66 kg/m  Well developed and nourished in no acute distress HENT normal Neck supple  Clear Irregularly irregular rate and rhythm, no murmurs or gallops Abd-soft with active BS No Clubbing cyanosis 1+ on the right trace on the left edema Skin-warm and dry A & Oriented  Grossly normal sensory  and motor function  ECG afib @ 68   ECG atrial fibrillation at 75 Normal-/10/38  12.22 ECG atrial fib @ 87 -/.09/38  Has replaced sinus 4/22  Assessment and  Plan  Ventricular tachycardia  Low-voltage ECG  Cardiomyopathy-nonischemic abnormal signal average ECG/MRI negative fat pad biopsy  Atrial fibrillation-permanent  Stroke   Hypertension    Hypothyroidism-iatrogenic-treated  High Risk Medication Surveillance amiodarone   Atrial fibrillation --persistent--shortness of breath attributed to the atrial fibrillation has improved despite atrial fibrillation persisting making much more consistent with a multilobar Pneumonia which is improving.Hence, I would not undertake efforts to restore sinus rhythm.  The amiodarone has been discontinued.  It was used for ventricular tachycardia.  We will need to see if ventricular tachycardia recurs but we have discussed the gradual washout of the drug and the likelihood that anything untoward would emerge rather gradually also.  In the hospital because of hypotension carvedilol and spironolactone were discontinued.  We will resume the spironolactone today at 12.5 and the carvedilol at 3.125 twice daily in 10 days if the blood pressure remains okay.  Moreover, his oxygen saturation levels have been in the mid 90s, I will have him hold his oxygen during the day and that he can follow-up with this with Dr. Ouida Sills when he sees him 6/13

## 2021-12-07 NOTE — Patient Instructions (Signed)
Medication Instructions:  - Your physician has recommended you make the following change in your medication:   1) RESTART Spironolactone 25 mg: - take 0.5 tablet (12.5 mg) by mouth once daily   2) In 1-2 weeks, if your blood pressure is doing ok , then you may  RESTART Coreg (Carvediolol) at 3.125 mg: - take 1 tablet by mouth TWICE daily   *If you need a refill on your cardiac medications before your next appointment, please call your pharmacy*   Lab Work: N/a If you have labs (blood work) drawn today and your tests are completely normal, you will receive your results only by: MyChart Message (if you have [place]) OR A paper copy in the mail If you have any lab test that is abnormal or we need to change your treatment, we will call you to review the results.   Testing/Procedures: None ordered   Follow-Up: At [facility], you and your health needs are our priority.  As part of our continuing mission to provide you with exceptional heart care, we have created designated Provider Care Teams.  These Care Teams include your primary Cardiologist (physician) and Advanced Practice Providers (APPs -  Physician Assistants and Nurse Practitioners) who all work together to provide you with the care you need, when you need it.  We recommend signing up for the patient portal called "MyChart".  Sign up information is provided on this After Visit Summary.  MyChart is used to connect with patients for Virtual Visits (Telemedicine).  Patients are able to view lab/test results, encounter notes, upcoming appointments, etc.  Non-urgent messages can be sent to your provider as well.   To learn more about what you can do with MyChart, go to [email or web address].    Your next appointment:   1) As scheduled with Christell Faith, PA in June    2) In 3 months with Dr. Caryl Comes  The format for your next appointment:   In Person  Provider:   As above     Other Instructions N/a  Important Information  About [place]

## 2021-12-13 ENCOUNTER — Other Ambulatory Visit: Payer: Self-pay | Admitting: Internal Medicine

## 2021-12-25 ENCOUNTER — Telehealth: Payer: Self-pay | Admitting: Internal Medicine

## 2021-12-25 MED ORDER — TORSEMIDE 10 MG PO TABS: 20.0000 mg | ORAL_TABLET | Freq: Every day | ORAL | 0 refills | Status: DC

## 2021-12-25 NOTE — Telephone Encounter (Signed)
Requested Prescriptions   Signed Prescriptions Disp Refills   torsemide (DEMADEX) 10 MG tablet 180 tablet 0    Sig: Take 2 tablets (20 mg total) by mouth daily.    Authorizing Provider: END, CHRISTOPHER    Ordering User: Raelene Bott, Amiri Tritch L

## 2021-12-25 NOTE — Telephone Encounter (Signed)
*  STAT* If patient is at the pharmacy, call can be transferred to refill team.   1. Which medications need to be refilled? (please list name of each medication and dose if known) torsemide (DEMADEX) 10 MG tablet  2. Which pharmacy/location (including street and city if local pharmacy) is medication to be sent to? [place], [place]  3. Do they need a 30 day or 90 day supply? [place]

## 2021-12-29 ENCOUNTER — Ambulatory Visit: Payer: Medicare Other | Admitting: Primary Care

## 2022-01-05 ENCOUNTER — Ambulatory Visit: Payer: Medicare Other | Admitting: Cardiology

## 2022-01-05 ENCOUNTER — Encounter: Payer: Self-pay | Admitting: Physician Assistant

## 2022-01-05 VITALS — BP 106/61 | HR 63 | Ht 66.0 in | Wt 165.2 lb

## 2022-01-05 DIAGNOSIS — I4819 Other persistent atrial fibrillation: Secondary | ICD-10-CM

## 2022-01-05 DIAGNOSIS — I428 Other cardiomyopathies: Secondary | ICD-10-CM

## 2022-01-05 DIAGNOSIS — I472 Ventricular tachycardia, unspecified: Secondary | ICD-10-CM

## 2022-01-05 DIAGNOSIS — I5022 Chronic systolic (congestive) heart failure: Secondary | ICD-10-CM | POA: Diagnosis not present

## 2022-01-05 DIAGNOSIS — R0602 Shortness of breath: Secondary | ICD-10-CM | POA: Diagnosis not present

## 2022-01-05 DIAGNOSIS — Z79899 Other long term (current) drug therapy: Secondary | ICD-10-CM

## 2022-01-05 DIAGNOSIS — R001 Bradycardia, unspecified: Secondary | ICD-10-CM

## 2022-01-05 MED ORDER — DIGOXIN 125 MCG PO TABS: 0.1250 mg | ORAL_TABLET | Freq: Every day | ORAL | 1 refills | Status: DC

## 2022-01-05 MED ORDER — SPIRONOLACTONE 25 MG PO TABS: 25.0000 mg | ORAL_TABLET | Freq: Every day | ORAL | 1 refills | Status: DC

## 2022-01-17 ENCOUNTER — Other Ambulatory Visit
Admission: RE | Admit: 2022-01-17 | Discharge: 2022-01-17 | Disposition: A | Discharge status: Home / Self Care | Payer: Medicare Other | Source: Ambulatory Visit | Attending: Cardiology | Admitting: Cardiology

## 2022-01-17 DIAGNOSIS — I428 Other cardiomyopathies: Secondary | ICD-10-CM | POA: Diagnosis present

## 2022-01-17 DIAGNOSIS — I5022 Chronic systolic (congestive) heart failure: Secondary | ICD-10-CM | POA: Insufficient documentation

## 2022-01-17 DIAGNOSIS — I4819 Other persistent atrial fibrillation: Secondary | ICD-10-CM | POA: Diagnosis present

## 2022-01-17 DIAGNOSIS — Z79899 Other long term (current) drug therapy: Secondary | ICD-10-CM | POA: Diagnosis present

## 2022-01-17 LAB — BASIC METABOLIC PANEL
Anion gap: 11 (ref 5–15)
BUN: 16 mg/dL (ref 8–23)
CO2: 24 mmol/L (ref 22–32)
Calcium: 8.9 mg/dL (ref 8.9–10.3)
Chloride: 102 mmol/L (ref 98–111)
Creatinine, Ser: 0.81 mg/dL (ref 0.61–1.24)
GFR, Estimated: >60 mL/min (ref >60)
Glucose, Bld: 167 mg/dL — ABNORMAL HIGH (ref 70–99)
Potassium: 4.1 mmol/L (ref 3.5–5.1)
Sodium: 137 mmol/L (ref 135–145)

## 2022-01-17 LAB — DIGOXIN LEVEL: Digoxin Level: 1.3 ng/mL (ref 0.8–2.0)

## 2022-02-15 NOTE — Progress Notes (Signed)
Cardiology Office Note    Date:  02/16/2022   ID:  Austin Warren, DOB Jan 26, 1941, MRN [id number]  PCP:  Kirk Ruths, MD  Cardiologist:  Nelva Bush, MD  Electrophysiologist:  Virl Axe, MD   Chief Complaint: Follow-up  History of Present Illness:   Austin Warren is a 81 y.o. 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  He has been followed by EP for history of VT with cardiac MRI in 10/2010 showing mild LVH with mild LVE, mild posterior lateral wall hypokinesis with no discrete RWMA with an EF of 51%.  No hyperenhancement, scar, or infiltration.  Prominent epicardial fat pad noted.  Echo in 2013 demonstrated an EF of 50 to 55%, mild concentric LVH, normal wall motion, normal LV diastolic function parameters, mild mitral regurgitation, mildly dilated left atrium, and a PASP of 35 mmHg.  He was admitted with a CVA treated with tPA in 05/2014 and noted to be in A-fib.  He was initiated on apixaban at that time.  Echo during that admission demonstrated an EF of 45 to 50%, moderate LVH, hypokinesis of the inferior and apical myocardium, mild mitral regurgitation, mild biatrial enlargement, and a PASP of 47 mmHg.  Repeat cardiac MRI in 10/2014 showed an EF of 45% with a mildly dilated LV with mild concentric LVH and moderate basal septal hypertrophy, mild global hypokinesis more pronounced in the basal inferior, inferolateral walls, and apex with significant thinning of the basal inferior and inferolateral walls corresponding to almost transmural LGE in the segments with the pattern of LGE most likely consistent with prior MI in the posterior lateral artery with low suspicion for cardiac amyloidosis, normal RV systolic function and ventricular cavity size, mild to moderate  mitral regurgitation, mild tricuspid regurgitation, moderately dilated left atrium, mildly dilated right atrium, and normal size aortic root/ascending aorta.  Fat pad biopsy was negative.  He was admitted in 2017 with sustained VT and was transferred to [facility] with VT storm.  Echo on 12/02/2015 demonstrated an EF of 40 to 45% with akinesis of the basal mid inferolateral myocardium, mild mitral regurgitation, severely dilated left atrium, mildly dilated right atrium, and a PASP of 46 mmHg.  Echo several days later showed an EF of 40 to 45% with hypokinesis worse at the apex, mild aortic insufficiency and mitral regurgitation, mildly dilated left atrium, and a PASP of 52 mmHg.  Repeat cardiac MRI on 12/05/2015 demonstrated an EF of 43% with moderate LVE with diffuse hypokinesis worse in the apex and basal inferolateral wall, subendocardial scar in the basal inferior lateral wall, normal RV, mild to moderate mitral and tricuspid regurgitation, moderately dilated left atrium, and mild aortic root enlargement.  When compared to MRI from 10/2014, EF was similar and the basal inferolateral scar was less impressive.  He presented for VT ablation on 12/07/2015, in sinus rhythm, with 3D mapping of the left ventricle without evidence of endocardial scar.  Procedure was notable for atrial flutter.  He became unstable requiring urgent cardioversion.  ICD implantation was discussed, though deferred.  He was discharged on titrated dose of mexiletine.  He was seen again in 12/2015 for recurrent VT, and was placed on amiodarone.  He underwent DCCV in 02/2018 for A-fib.  He has required repeat DCCV in 03/2019 and again in 05/2019 for recurrent A-fib.  Echo  in 03/2019 demonstrated an EF of 20 to 25%, mildly reduced RV systolic function with normal ventricular cavity size, severely dilated left atrium, moderate mitral regurgitation, mild to moderate tricuspid regurgitation, moderately elevated PASP, and mild dilatation of the aortic root  and ascending aorta.  Echo in 08/2019 demonstrated an EF of 30 to 35%, mildly dilated LV internal cavity size, hypokinesis of the inferolateral and inferior walls, mildly reduced RV systolic function with normal ventricular cavity size, mildly to moderately dilated left atrium, and mild to moderate mitral regurgitation.  Given cath showed no evidence of CAD 2012, he underwent coronary CTA in 08/2019 showed a calcium score of 801 which was the 63rd percentile, severe stenosis involving the mid LAD with moderate stenosis involving the distal left main and proximal LAD.  There was mild a sending aortic dilatation of up to 43 mm in diameter.  CT FFR showed no significant stenosis.  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  He was seen by EP and 06/2021 with recommendation for the patient to resume amiodarone, though this was subsequently later discontinued due to concern regarding questionable pulmonary toxicity.  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  Given reduction in LV systolic function, he underwent R/LHC in 10/2021 that showed mild plaquing of the LCx and RCA with up to 20% stenosis with no significant stenosis observed to explain  the reduced EF mildly elevated left heart, right heart, and pulmonary artery pressures with mildly reduced cardiac output/index.  He was admitted to the hospital in 11/2021 with dyspnea.  CTA of the chest was negative for PE, though he was found to have multifocal pneumonia and volume overload.  Hospitalization was complicated by hypotension requiring midodrine and Levophed, A-fib with RVR, and acute hypoxic respiratory failure requiring BiPAP.  He followed up with EP on 12/07/2021 and was restarted on spironolactone and carvedilol.  It was also recommended not to undertake efforts to restore sinus rhythm given noted improvement in dyspnea despite underlying A-fib, with his underlying dyspnea felt to be more related to his multilobar pneumonia.  Supplemental oxygen was subsequently discontinued.  He was last seen in the office on 01/05/2022, and was without symptoms of angina or decompensation.  He indicated follow-up chest x-rays that showed resolution of his pneumonia.  Due to difficulty in splitting spironolactone tab, this was increased to 25 mg daily.  He comes in accompanied by his wife today and is doing well from a cardiac perspective, without symptoms of angina or decompensation.  His breathing is back to baseline.  No symptoms concerning for VT recurrence.  No dizziness, presyncope, or syncope.  No falls, hematochezia, or melena.  He is adherent and tolerating cardiac medications without issues.  Following his admission in 11/2021, he did lose approximately 10 pounds, though with improvement in appetite he has regained this with his wife  indicating his weight is back to his baseline and has remained stable at home.  No orthopnea or early satiety.  At baseline, he does have mild right ankle and pedal edema which stems from a prior fracture.  He is scheduled to see EP and to be evaluated by the advanced heart failure team next month.   Labs independently reviewed: 01/2022 - potassium 4.1, BUN 16, serum  creatinine 0.81, digoxin 1.3 12/2021 - TSH normal, albumin 3.5, AST/ALT normal, A1c 5.6, TC 140, TG 113, HDL 46, LDL 71 11/2021 - magnesium 2.2, Hgb 14.1, PLT 204  Past Medical History:  Diagnosis Date   Arthritis    Benign prostatic hypertrophy    Bursitis of left shoulder    CHF (congestive heart failure) ([place])    Coronary artery disease 08/2019   Moderate multivessel CAD (not hemodynamically significant by CT-FFR)   Diabetes ([place])    Dyspnea on exertion 11/01/2011   Elevated PSA    History of colon polyps    History of colon polyps    Hyperlipidemia    Hypertension    Melanoma ([place])    under arm   Microscopic hematuria    Osteoarthritis    Retroperitoneal mass    Stroke ([place]) 05/2014   TIA (transient ischemic attack)    Urinary retention    UTI (urinary tract infection)    Ventricular tachycardia ([facility])    RBB/LAHB ideopathic VT, 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    Past Surgical History:  Procedure Laterality Date   CARDIOVERSION     CARDIOVERSION N/A 03/07/2018   Procedure: CARDIOVERSION;  Surgeon: Wellington Hampshire, MD;  Location: [facility];  Service: Cardiovascular;  Laterality: N/A;   CARDIOVERSION N/A 04/06/2019   Procedure: CARDIOVERSION;  Surgeon: Wellington Hampshire, MD;  Location: [place] [facility];  Service: Cardiovascular;  Laterality: N/A;   CARDIOVERSION N/A 06/02/2019   Procedure: CARDIOVERSION;  Surgeon: Nelva Bush, MD;  Location: [facility];  Service: Cardiovascular;  Laterality: N/A;   CARDIOVERSION N/A 07/25/2021   Procedure: CARDIOVERSION;  Surgeon: Nelva Bush, MD;  Location: [facility];  Service: Cardiovascular;  Laterality: N/A;   COLONOSCOPY WITH PROPOFOL N/A 10/17/2015   Procedure: COLONOSCOPY WITH PROPOFOL;  Surgeon: Manya Silvas, MD;  Location: [facility];  Service: Endoscopy;  Laterality: N/A;   COLONOSCOPY WITH PROPOFOL N/A 03/17/2019   Procedure: COLONOSCOPY WITH PROPOFOL;  Surgeon: Toledo, Benay Pike, MD;  Location: [facility] ENDOSCOPY;  Service:  Gastroenterology;  Laterality: N/A;   ELECTROPHYSIOLOGIC STUDY N/A 12/07/2015   Procedure: V Tach Ablation;  Surgeon: Will Schlachter Leeds, MD;  Location: [place] [facility];  Service: Cardiovascular;  Laterality: N/A;   JOINT REPLACEMENT     R TKR   RADIOLOGY WITH ANESTHESIA N/A 05/18/2014   Procedure: RADIOLOGY WITH ANESTHESIA;  Surgeon: Rob Hickman, MD;  Location: [facility];  Service: Radiology;  Laterality: N/A;   REPLACEMENT TOTAL KNEE     REPLACEMENT TOTAL KNEE Left    RIGHT/LEFT HEART CATH AND CORONARY ANGIOGRAPHY N/A 10/30/2021   Procedure: RIGHT/LEFT HEART CATH AND CORONARY ANGIOGRAPHY;  Surgeon: Nelva Bush, MD;  Location: [place] [facility];  Service: Cardiovascular;  Laterality: N/A;   VASECTOMY      Current Medications: Current Meds  Medication Sig   apixaban (ELIQUIS) 5 MG TABS tablet Take 1 tablet (5 mg total) by mouth every 12 (twelve) hours.   Ascorbic Acid (VITAMIN C) 500 MG tablet Take 500 mg by mouth daily.     atorvastatin (LIPITOR) 10 MG tablet Take 10  mg by mouth daily.   carvedilol (COREG) 3.125 MG tablet Take 1 tablet (3.125 mg total) by mouth 2 (two) times daily with a meal.   Cholecalciferol (VITAMIN D3) 1000 UNITS CAPS Take 1,000 Units by mouth daily.    dapagliflozin propanediol (FARXIGA) 10 MG TABS tablet Take 1 tablet (10 mg total) by mouth daily before breakfast.   digoxin (LANOXIN) 0.125 MG tablet Take 1 tablet (0.125 mg total) by mouth every other day.   docusate sodium (COLACE) 50 MG capsule Take 50 mg by mouth 2 (two) times daily.   finasteride (PROSCAR) 5 MG tablet TAKE 1 TABLET BY MOUTH DAILY   levothyroxine (SYNTHROID) 100 MCG tablet Take 1 tablet (100 mcg total) by mouth daily before breakfast.   mexiletine (MEXITIL) 150 MG capsule TAKE 1 CAPSULE BY MOUTH TWICE DAILY   Multiple Vitamin (MULTIVITAMIN WITH MINERALS) TABS tablet Take 1 tablet by mouth daily. One A Day 50+   spironolactone (ALDACTONE) 25 MG tablet Take 1 tablet (25 mg total) by  mouth daily.   tamsulosin (FLOMAX) 0.4 MG CAPS capsule TAKE 2 CAPSULES BY MOUTH DAILY   torsemide (DEMADEX) 10 MG tablet Take 1 tablet (10 mg total) by mouth daily.   [DISCONTINUED] digoxin (LANOXIN) 0.125 MG tablet Take 1 tablet (0.125 mg total) by mouth daily.   [DISCONTINUED] torsemide (DEMADEX) 10 MG tablet Take 2 tablets (20 mg total) by mouth daily.    Allergies:   Ciprofloxacin, Levofloxacin, Amiodarone, Enalapril maleate, Metoprolol, and Sulfa antibiotics   Social History   Socioeconomic History   Marital status: Married    Spouse name: Not on file   Number of children: 2   Years of education: BS   Highest education level: Not on file  Occupational History   Not on file  Tobacco Use   Smoking status: Former    Packs/day: 2.00    Years: 10.00    Total pack years: 20.00    Types: Cigarettes    Start date: 02/10/1960    Quit date: 02/09/1970    Years since quitting: 52.0   Smokeless tobacco: Never   Tobacco comments:    05/2014    quit smoking  45 years ago  Vaping Use   Vaping Use: Never used  Substance and Sexual Activity   Alcohol use: Yes    Comment: 2 beers a few days per week   Drug use: No   Sexual activity: Not on file  Other Topics Concern   Not on file  Social History Narrative   Patient is married with 1 living and 1 deceased child.   Patient is right handed.   Patient has BS degree.   Patient 1 cup daily.   Social Determinants of Health   Financial Resource Strain: Not on file  Food Insecurity: Not on file  Transportation Needs: Not on file  Physical Activity: Not on file  Stress: Not on file  Social Connections: Not on file     Family History:  The patient's family history includes Hypertension in his father; Stroke in his father. There is no history of Heart attack, Kidney disease, Prostate cancer, Kidney cancer, or Bladder Cancer.  ROS:   12-point review of systems is negative unless otherwise noted in HPI.   EKGs/Labs/Other Studies  Reviewed:    Studies reviewed were summarized above. The additional studies were reviewed today:  Limited echo 11/26/2021: 1. Left ventricular ejection fraction, by estimation, is 25 to 30%. The  left ventricle has severely decreased function. The left  ventricle  demonstrates severe global hypokinesis (basal to mid anterior/anteroseptal  wall motion best preserved . The left  ventricular internal cavity size was mildly dilated. Left ventricular  diastolic parameters are indeterminate.   2. Right ventricular systolic function is mildly reduced. There is  moderately elevated pulmonary artery systolic pressure. The estimated  right ventricular systolic pressure is 64.3 mmHg.   3. Left atrial size was moderately dilated.   4. Moderate mitral valve regurgitation.   5. Aortic valve regurgitation is mild to moderate. Aortic valve sclerosis  is present, with no evidence of aortic valve stenosis.   6. The inferior vena cava is dilated in size with >50% respiratory  variability, suggesting right atrial pressure of 8 mmHg. __________  [facility] 10/30/2021: Conclusions: Mild plaquing of LCx and RCA with up to 20% narrowing.  No significant stenosis observed to explain reduced LVEF.  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 Mildly elevated left heart, right heart, and pulmonary artery pressures. Mildly reduced Fick cardiac output/index.   Recommendations: Continue gentle diuresis. Escalate goal-directed medical therapy, as tolerated. __________  2D echo 10/19/2021: 1. Left ventricular ejection fraction, by estimation, is 20 to 25%. The  left ventricle has severely decreased function. The left ventricle  demonstrates global hypokinesis. The left ventricular internal cavity size  was mildly dilated. Left ventricular  diastolic parameters are indeterminate.   2. Right ventricular systolic function is moderately reduced. The right  ventricular size is normal.   3. Left atrial size  was severely dilated.   4. Right atrial size was mild to moderately dilated.   5. The mitral valve is normal in structure. Mild to moderate mitral valve  regurgitation.   6. The aortic valve is tricuspid. Aortic valve regurgitation is mild.  Aortic valve sclerosis/calcification is present, without any evidence of  aortic stenosis.   7. Aortic dilatation noted. There is moderate dilatation of the ascending  aorta, measuring 45 mm.   8. The inferior vena cava is normal in size with greater than 50%  respiratory variability, suggesting right atrial pressure of 3 mmHg. __________  2D echo 11/29/2020: 1. Left ventricular ejection fraction, by estimation, is 40 to 45%. The  left ventricle has mildly decreased function. The left ventricle  demonstrates global hypokinesis. The left ventricular internal cavity size  was mildly dilated. There is mild left  ventricular hypertrophy. Left ventricular diastolic parameters are  indeterminate. The average left ventricular global longitudinal strain is  -10.3 %. The global longitudinal strain is abnormal.   2. Right ventricular systolic function is normal. The right ventricular  size is normal. There is moderately elevated pulmonary artery systolic  pressure.   3. Left atrial size was moderately dilated.   4. Right atrial size was mildly dilated.   5. The mitral valve is degenerative. Moderate mitral valve regurgitation.   6. The aortic valve is tricuspid. There is mild calcification of the  aortic valve. There is mild thickening of the aortic valve. Aortic valve  regurgitation is mild. Mild to moderate aortic valve  sclerosis/calcification is present, without any evidence  of aortic stenosis.   7. Aortic dilatation noted. There is mild dilatation of the ascending  aorta, measuring 43 mm.   8. The inferior vena cava is normal in size with greater than 50%  respiratory variability, suggesting right atrial pressure of 3 mmHg.   Comparison(s): EF  30-35%, hypokinesis of inferolateral, inferior walls.  Mild to moderate MR, mild to moderate dilated LA. Trivial AI. __________  See CV procedure  in Epic for remaining studies   EKG:  EKG is ordered today.  The EKG ordered today demonstrates A-fib with slow ventricular response, 56 bpm, nonspecific ST-T changes  Recent Labs: 11/24/2021: B Natriuretic Peptide 742.7 11/25/2021: TSH 4.360 11/30/2021: Hemoglobin 14.1; Platelets 204 12/01/2021: ALT 151; Magnesium 2.2 01/17/2022: BUN 16; Creatinine, Ser 0.81; Potassium 4.1; Sodium 137  Recent Lipid Panel    Component Value Date/Time   CHOL 124 05/19/2014 0510   TRIG 142 05/19/2014 0510   HDL 38 (L) 05/19/2014 0510   CHOLHDL 3.3 05/19/2014 0510   VLDL 28 05/19/2014 0510   LDLCALC 58 05/19/2014 0510    PHYSICAL EXAM:    VS:  BP 118/60 (BP Location: Left Arm, Patient Position: Sitting, Cuff Size: Normal)   Pulse (!) 56   Ht '5\' 6"'$  (1.676 m)   Wt 168 lb (76.2 kg)   SpO2 97%   BMI 27.12 kg/m   BMI: Body mass index is 27.12 kg/m.  Physical Exam Vitals reviewed.  Constitutional:      Appearance: He is well-developed.  HENT:     Head: Normocephalic and atraumatic.  Eyes:     General:        Right eye: No discharge.        Left eye: No discharge.  Neck:     Vascular: No JVD.  Cardiovascular:     Rate and Rhythm: Normal rate. Rhythm irregularly irregular.     Heart sounds: Normal heart sounds, S1 normal and S2 normal. Heart sounds not distant. No midsystolic click and no opening snap. No murmur heard.    No friction rub.  Pulmonary:     Effort: Pulmonary effort is normal. No respiratory distress.     Breath sounds: Normal breath sounds. No decreased breath sounds, wheezing or rales.  Chest:     Chest wall: No tenderness.  Abdominal:     General: There is no distension.     Palpations: Abdomen is soft.     Tenderness: There is no abdominal tenderness.  Musculoskeletal:     Cervical back: Normal range of motion.     Comments:  Trivial right ankle swelling which wife indicates this is at his baseline.  Skin:    General: Skin is warm and dry.     Nails: There is no clubbing.  Neurological:     Mental Status: He is alert and oriented to person, place, and time.  Psychiatric:        Speech: Speech normal.        Behavior: Behavior normal.        Thought Content: Thought content normal.        Judgment: Judgment normal.     Wt Readings from Last 3 Encounters:  02/16/22 168 lb (76.2 kg)  01/05/22 165 lb 3.2 oz (74.9 kg)  12/07/21 159 lb (72.1 kg)     ASSESSMENT & PLAN:   HFrEF secondary to NICM: He appears euvolemic and well compensated today.  His weight has remained stable at home with his wife reporting him being at his baseline weight currently.  He has been referred to the advanced heart failure team and will follow up with them next month.  Add Farxiga 10 mg daily to further optimize GDMT with a follow-up BMP 1 week thereafter.  With the addition of Farxiga, we will reduce his torsemide to 10 mg daily in an effort to minimize dehydration and AKI.  He will otherwise continue current dose of carvedilol and spironolactone.  We  will transition his digoxin to 0.125 mg every other day given a level of 1.3 last month.  We will recheck a digoxin level in 1 week.  He has not been maintained on ACE inhibitor/ARB/ARN I given history of angioedema noted with enalapril.  We will not rechallenge him at this time.  CHF education.  Nonobstructive CAD: No symptoms concerning for angina.  He remains on apixaban, given underlying A-fib, in place of aspirin to minimize bleeding risk.  Continue risk factor modification and current medical therapy including carvedilol and atorvastatin.  Permanent A-fib: Ventricular rates are well controlled.  He remains on carvedilol and apixaban 5 mg twice daily (does not meet reduced dosing criteria.  EP is pursuing a rate control strategy.  History of VT: Quiescent.  He remains on mexiletine per  EP.  Ongoing management per Dr. Caryl Comes.   Disposition: F/u with Dr. Saunders Revel or an APP in 4 months, and EP/advanced heart failure as directed.    Medication Adjustments/Labs and Tests Ordered: Current medicines are reviewed at length with the patient today.  Concerns regarding medicines are outlined above. Medication changes, Labs and Tests ordered today are summarized above and listed in the Patient Instructions accessible in Encounters.   Signed, Christell Faith, PA-C 02/16/2022 3:57 PM     [address] [place], [place] [address] [phone]

## 2022-02-16 ENCOUNTER — Ambulatory Visit: Payer: Medicare Other | Admitting: Physician Assistant

## 2022-02-16 ENCOUNTER — Encounter: Payer: Self-pay | Admitting: Physician Assistant

## 2022-02-16 VITALS — BP 118/60 | HR 56 | Ht 66.0 in | Wt 168.0 lb

## 2022-02-16 DIAGNOSIS — I2510 Atherosclerotic heart disease of native coronary artery without angina pectoris: Secondary | ICD-10-CM | POA: Diagnosis not present

## 2022-02-16 DIAGNOSIS — I4821 Permanent atrial fibrillation: Secondary | ICD-10-CM

## 2022-02-16 DIAGNOSIS — I5022 Chronic systolic (congestive) heart failure: Secondary | ICD-10-CM

## 2022-02-16 DIAGNOSIS — Z79899 Other long term (current) drug therapy: Secondary | ICD-10-CM

## 2022-02-16 DIAGNOSIS — I472 Ventricular tachycardia, unspecified: Secondary | ICD-10-CM

## 2022-02-16 DIAGNOSIS — I428 Other cardiomyopathies: Secondary | ICD-10-CM | POA: Diagnosis not present

## 2022-02-16 MED ORDER — DIGOXIN 125 MCG PO TABS: 0.1250 mg | ORAL_TABLET | ORAL | 1 refills | Status: DC

## 2022-02-16 MED ORDER — TORSEMIDE 10 MG PO TABS: 10.0000 mg | ORAL_TABLET | Freq: Every day | ORAL | 0 refills | Status: DC

## 2022-02-16 MED ORDER — DAPAGLIFLOZIN PROPANEDIOL 10 MG PO TABS: 10.0000 mg | ORAL_TABLET | Freq: Every day | ORAL | 11 refills | Status: AC

## 2022-02-16 NOTE — Patient Instructions (Signed)
Medication Instructions:  Your physician has recommended you make the following change in your medication:   CHANGE Digoxin 0.125 mg to every other day DECREASE Torsemide to 10 mg once daily START Farxiga 10 mg once daily  *If you need a refill on your cardiac medications before your next appointment, please call your pharmacy*   Lab Work: BMET & Digoxin level in one week over at the [employer] at [facility] then go to 1st desk on the right to check in (REGISTRATION). No appointment is needed.    Lab hours: Monday- Friday (7:30 am- 5:30 pm)  If you have labs (blood work) drawn today and your tests are completely normal, you will receive your results only by: [place] (if you have MyChart) OR A paper copy in the mail If you have any lab test that is abnormal or we need to change your treatment, we will call you to review the results.   Testing/Procedures: None   Follow-Up: At [facility], you and your health needs are our priority.  As part of our continuing mission to provide you with exceptional heart care, we have created designated Provider Care Teams.  These Care Teams include your primary Cardiologist (physician) and Advanced Practice Providers (APPs -  Physician Assistants and Nurse Practitioners) who all work together to provide you with the care you need, when you need it.   Your next appointment:   4 month(s)  The format for your next appointment:   In Person  Provider:   Nelva Bush, MD or Christell Faith, PA-C      Important Information About Sugar

## 2022-02-20 ENCOUNTER — Other Ambulatory Visit: Payer: Self-pay | Admitting: Internal Medicine

## 2022-02-23 ENCOUNTER — Other Ambulatory Visit
Admission: RE | Admit: 2022-02-23 | Discharge: 2022-02-23 | Disposition: A | Discharge status: Home / Self Care | Payer: Medicare Other | Attending: Physician Assistant | Admitting: Physician Assistant

## 2022-02-23 DIAGNOSIS — I5022 Chronic systolic (congestive) heart failure: Secondary | ICD-10-CM | POA: Diagnosis present

## 2022-02-23 DIAGNOSIS — I2510 Atherosclerotic heart disease of native coronary artery without angina pectoris: Secondary | ICD-10-CM

## 2022-02-23 DIAGNOSIS — Z79899 Other long term (current) drug therapy: Secondary | ICD-10-CM

## 2022-02-23 DIAGNOSIS — I428 Other cardiomyopathies: Secondary | ICD-10-CM | POA: Insufficient documentation

## 2022-02-23 DIAGNOSIS — I4821 Permanent atrial fibrillation: Secondary | ICD-10-CM | POA: Diagnosis present

## 2022-02-23 LAB — BASIC METABOLIC PANEL
Anion gap: 8 (ref 5–15)
BUN: 21 mg/dL (ref 8–23)
CO2: 27 mmol/L (ref 22–32)
Calcium: 8.9 mg/dL (ref 8.9–10.3)
Chloride: 101 mmol/L (ref 98–111)
Creatinine, Ser: 0.94 mg/dL (ref 0.61–1.24)
GFR, Estimated: >60 mL/min (ref >60)
Glucose, Bld: 104 mg/dL — ABNORMAL HIGH (ref 70–99)
Potassium: 4.1 mmol/L (ref 3.5–5.1)
Sodium: 136 mmol/L (ref 135–145)

## 2022-02-23 LAB — DIGOXIN LEVEL: Digoxin Level: 0.6 ng/mL — ABNORMAL LOW (ref 0.8–2.0)

## 2022-03-07 ENCOUNTER — Ambulatory Visit: Payer: Medicare Other | Admitting: Internal Medicine

## 2022-03-20 ENCOUNTER — Other Ambulatory Visit: Payer: Self-pay | Admitting: Internal Medicine

## 2022-03-22 ENCOUNTER — Encounter: Payer: Self-pay | Admitting: Internal Medicine

## 2022-03-22 ENCOUNTER — Ambulatory Visit: Payer: Medicare Other | Attending: Internal Medicine | Admitting: Internal Medicine

## 2022-03-22 VITALS — BP 120/50 | HR 57 | Ht 66.0 in | Wt 167.4 lb

## 2022-03-22 DIAGNOSIS — I472 Ventricular tachycardia, unspecified: Secondary | ICD-10-CM | POA: Diagnosis not present

## 2022-03-22 DIAGNOSIS — I4821 Permanent atrial fibrillation: Secondary | ICD-10-CM

## 2022-03-22 DIAGNOSIS — I428 Other cardiomyopathies: Secondary | ICD-10-CM | POA: Diagnosis not present

## 2022-03-22 DIAGNOSIS — I5022 Chronic systolic (congestive) heart failure: Secondary | ICD-10-CM

## 2022-03-22 NOTE — Patient Instructions (Signed)
Medication Instructions:  - Your physician has recommended you make the following change in your medication:   1) STOP Digoxin  *If you need a refill on your cardiac medications before your next appointment, please call your pharmacy*   Lab Work: - none ordered  If you have labs (blood work) drawn today and your tests are completely normal, you will receive your results only by: [place] (if you have MyChart) OR A paper copy in the mail If you have any lab test that is abnormal or we need to change your treatment, we will call you to review the results.   Testing/Procedures: - none ordered   Follow-Up: At [facility] you and your health needs are our priority.  As part of our continuing mission to provide you with exceptional heart care, we have created designated Provider Care Teams.  These Care Teams include your primary Cardiologist (physician) and Advanced Practice Providers (APPs -  Physician Assistants and Nurse Practitioners) who all work together to provide you with the care you need, when you need it.  We recommend signing up for the patient portal called "MyChart".  Sign up information is provided on this After Visit Summary.  MyChart is used to connect with patients for Virtual Visits (Telemedicine).  Patients are able to view lab/test results, encounter notes, upcoming appointments, etc.  Non-urgent messages can be sent to your provider as well.   To learn more about what you can do with MyChart, go to [email or web address].    Your next appointment:   6 month(s)  The format for your next appointment:   In Person  Provider:   Virl Axe, MD   Other Instructions N/a  Important Information About [place]

## 2022-03-22 NOTE — Progress Notes (Signed)
Patient Care Team: Kirk Ruths, MD as PCP - General (Unknown Physician Specialty) End, Harrell Gave, MD as PCP - Cardiology (Cardiology) Deboraha Sprang, MD as PCP - Electrophysiology (Cardiology)   HPI  Austin Warren is a 81 y.o. male Seen in followup for a ventricular tachycardia with a right bundle superior axis relatively narrow QRS complex and positive concordance suggesting a septal origin and possible verapamil sensitivity.   Also recurrent atrial fibrillation for which underwent cardioversion (1/23)  Work-up for his ventricular tachycardia demonstrated normal LV function no obstructive coronary disease  Signal average Electrocardiogram was markedly abnormal.  MRI 4/16>>There is no significant change from the prior study with persistent basal inferior and inferolateral late gadolinium enhancement slightly more pronounced on the current study and a small focal late gadolinium enhancement at the point of inferior RV attachment to the LV wall.  He also underwent fat pad biopsy>> neg    He had recurrent ventricular tachycardia. He was put on verapamil.  5/17 he was transferred to [facility] [place] [facility]. Evaluation included an echo with EF of 40-45%. Cardiac MR demonstrated EF of 43% with inferolateral and apical hypokinesis with inferolateral basilar scar which was if anything not worse compared to prior MR 4/16. He underwent EP testing. No scar was found and no ablation undertaken. Ventricular flutter was inducible. VT  was not. There have been discussions regarding ICD implantation. He elected to defer that decision  Discharge off amiod 2/2 bradycardi Had recurrent tachycardia >>>  started on mexiletine and resumed amiodarone after hiatus of years -- amiodarone has been on and off because of bradycardia He  had  stroke  11/15. He was found to be in atrial fibrillation. He was treated with TPA. Apixaban was initiated.  He has intellectual consequences and aphasia;   Underwent  cardioversion with Amio 1/23 and failed to hold sinus>> and no clear change in his symptoms so designated PERMANENT  3/23  Interval care with Dr CE for AHF  W/U as below without significant improvement and then hospitalization 5/23 for respiratory insufficiency with the presumption of multilobar pneumonia versus amiodarone lung toxicity prompting the discontinuation of the latter; notably heart rates were controlled while in hospital; discharged on O2   He has been scheduled to be seen by the heart failure team, currently breathing is at baseline.  His major limitation is mechanical related to his stroke.  Some asymmetric edema.  No interval palpitations.  No bleeding.  DATE TEST EF   4/12 LHC  No obst CAD  5/17 cMRI 43% Inferolateral subendo scar  2/21 CTA   Ca score 881 LADm-severe--FFR> 0.8  2/21 Echo  30-35%   4/23 Echo  20-25%   4/23  LHC  No obstructive CAD       Date Cr K TSH Hgb LFTs PFTs  6/17  0.94 3.9 2.8  25    8/18 0.9 4.0 7.5  16   1/19   4.99     2/19 0.8 4.0 3.9  18   8/19 0.86 4.6  15.5    2/20 0.9 4.0 4.87  22   10/20 1.08 4.7 6.7 16.5 23   3/21 0.9 4.4 4.55  21   9/21 0.8 4.5 5.22  21   11/22 1.0 4.3 5.22  26   8/23 0.83 4.1   151 (5/23)     Thromboembolic risk factors ( age  -2, HTN-1, TIA/CVA-2, Vasc disease -1, CHF-1) for a CHADSVASc Score of >=7  Antiarrhythmics Date Reason  stopped  amiodarone  5/23 ? Lung toxicity  mexiletine          Past Medical History:  Diagnosis Date   Arthritis    Benign prostatic hypertrophy    Bursitis of left shoulder    CHF (congestive heart failure) ([place])    Coronary artery disease 08/2019   Moderate multivessel CAD (not hemodynamically significant by CT-FFR)   Diabetes (HCC)    Dyspnea on exertion 11/01/2011   Elevated PSA    History of colon polyps    History of colon polyps    Hyperlipidemia    Hypertension    Melanoma ([place])    under arm   Microscopic hematuria    Osteoarthritis    Retroperitoneal mass     Stroke ([place]) 05/2014   TIA (transient ischemic attack)    Urinary retention    UTI (urinary tract infection)    Ventricular tachycardia (HCC)    RBB/LAHB ideopathic VT, 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    Past Surgical History:  Procedure Laterality Date   CARDIOVERSION     CARDIOVERSION N/A 03/07/2018   Procedure: CARDIOVERSION;  Surgeon: Wellington Hampshire, MD;  Location: [facility];  Service: Cardiovascular;  Laterality: N/A;   CARDIOVERSION N/A 04/06/2019   Procedure: CARDIOVERSION;  Surgeon: Wellington Hampshire, MD;  Location: [place] [facility];  Service: Cardiovascular;  Laterality: N/A;   CARDIOVERSION N/A 06/02/2019   Procedure: CARDIOVERSION;  Surgeon: Nelva Bush, MD;  Location: [place] [facility];  Service: Cardiovascular;  Laterality: N/A;   CARDIOVERSION N/A 07/25/2021   Procedure: CARDIOVERSION;  Surgeon: Nelva Bush, MD;  Location: [facility];  Service: Cardiovascular;  Laterality: N/A;   COLONOSCOPY WITH PROPOFOL N/A 10/17/2015   Procedure: COLONOSCOPY WITH PROPOFOL;  Surgeon: Manya Silvas, MD;  Location: [place];  Service: Endoscopy;  Laterality: N/A;   COLONOSCOPY WITH PROPOFOL N/A 03/17/2019   Procedure: COLONOSCOPY WITH PROPOFOL;  Surgeon: [place], Benay Pike, MD;  Location: [facility];  Service: Gastroenterology;  Laterality: N/A;   ELECTROPHYSIOLOGIC STUDY N/A 12/07/2015   Procedure: V Tach Ablation;  Surgeon: Will Einspahr Leeds, MD;  Location: [place] [facility];  Service: Cardiovascular;  Laterality: N/A;   JOINT REPLACEMENT     R TKR   RADIOLOGY WITH ANESTHESIA N/A 05/18/2014   Procedure: RADIOLOGY WITH ANESTHESIA;  Surgeon: Rob Hickman, MD;  Location: [place];  Service: Radiology;  Laterality: N/A;   REPLACEMENT TOTAL KNEE     REPLACEMENT TOTAL KNEE Left    RIGHT/LEFT HEART CATH AND CORONARY ANGIOGRAPHY N/A 10/30/2021   Procedure: RIGHT/LEFT HEART CATH AND CORONARY ANGIOGRAPHY;  Surgeon: Nelva Bush, MD;  Location: [place] [facility];  Service: Cardiovascular;   Laterality: N/A;   VASECTOMY      Current Outpatient Medications  Medication Sig Dispense Refill   apixaban (ELIQUIS) 5 MG TABS tablet Take 1 tablet (5 mg total) by mouth every 12 (twelve) hours. 180 tablet 1   Ascorbic Acid (VITAMIN C) 500 MG tablet Take 500 mg by mouth daily.       atorvastatin (LIPITOR) 10 MG tablet Take 10 mg by mouth daily.     carvedilol (COREG) 3.125 MG tablet Take 1 tablet (3.125 mg total) by mouth 2 (two) times daily with a meal. 180 tablet 0   Cholecalciferol (VITAMIN D3) 1000 UNITS CAPS Take 1,000 Units by mouth daily.      dapagliflozin propanediol (FARXIGA) 10 MG TABS tablet Take 1 tablet (10 mg total) by mouth daily before breakfast. 30 tablet 11   digoxin (LANOXIN)  0.125 MG tablet Take 1 tablet (0.125 mg total) by mouth every other day. 90 tablet 1   docusate sodium (COLACE) 50 MG capsule Take 50 mg by mouth 2 (two) times daily.     finasteride (PROSCAR) 5 MG tablet TAKE 1 TABLET BY MOUTH DAILY 90 tablet 3   levothyroxine (SYNTHROID) 100 MCG tablet Take 1 tablet (100 mcg total) by mouth daily before breakfast. 30 tablet 2   mexiletine (MEXITIL) 150 MG capsule TAKE 1 CAPSULE BY MOUTH TWICE DAILY 180 capsule 0   Multiple Vitamin (MULTIVITAMIN WITH MINERALS) TABS tablet Take 1 tablet by mouth daily. One A Day 50+     spironolactone (ALDACTONE) 25 MG tablet Take 1 tablet (25 mg total) by mouth daily. 90 tablet 1   tamsulosin (FLOMAX) 0.4 MG CAPS capsule TAKE 2 CAPSULES BY MOUTH DAILY 180 capsule 3   torsemide (DEMADEX) 10 MG tablet Take 1 tablet (10 mg total) by mouth daily. 180 tablet 0   No current facility-administered medications for this visit.    Allergies  Allergen Reactions   Ciprofloxacin Anaphylaxis   Levofloxacin Anaphylaxis    "throat closes"   Amiodarone Other (See Comments)    Pulmonary toxicity   Enalapril Maleate Swelling    Angioedema   Metoprolol Swelling    edema   Sulfa Antibiotics Rash and Other (See Comments)    Hypotension     Review of Systems negative except from HPI and PMH  Physical Exam BP (!) 120/50 (BP Location: Left Arm, Patient Position: Sitting, Cuff Size: Normal)   Pulse (!) 57   Ht '5\' 6"'$  (1.676 m)   Wt 167 lb 6 oz (75.9 kg)   SpO2 98%   BMI 27.02 kg/m  Well developed and nourished in no acute distress HENT normal Neck supple with JVP-flat Carotids brisk and full without bruits Clear Irregularly irregular rate and rhythm with controlled ventricular response, no murmurs or gallops Abd-soft with active BS without hepatomegaly No Clubbing cyanosis asymmetric right greater than left edema Skin-warm and dry A & Oriented  Grossly normal sensory and motor function   Assessment and  Plan  Ventricular tachycardia  Low-voltage ECG  Cardiomyopathy-nonischemic abnormal signal average ECG/MRI negative fat pad biopsy  Atrial fibrillation-permanent  Stroke   Hypertension    Hypothyroidism-iatrogenic-treated  Hyper transaminasemia   Atrial fibrillation is permanent.  Rates are slow.  We will discontinue digoxin.  He is to see Dr. Reine Just in heart failure clinic next week, I would be inclined towards doing the carvedilol as he seems to have no attributable symptoms to his bradycardia i.e. exercise intolerance or lightheadedness.  His right side mechanically is his major limitation   No interval ventricular tachycardia which we are aware.  Continue mexiletine 150 twice daily.  Hyper transaminasemia noted 5/23.  We will recheck the labs.

## 2022-03-27 ENCOUNTER — Other Ambulatory Visit: Payer: Self-pay | Admitting: Internal Medicine

## 2022-03-29 ENCOUNTER — Ambulatory Visit (HOSPITAL_COMMUNITY)
Admission: RE | Admit: 2022-03-29 | Discharge: 2022-03-29 | Disposition: A | Discharge status: Home / Self Care | Payer: Medicare Other | Source: Ambulatory Visit | Attending: Internal Medicine | Admitting: Internal Medicine

## 2022-03-29 ENCOUNTER — Encounter (HOSPITAL_COMMUNITY): Payer: Self-pay | Admitting: Internal Medicine

## 2022-03-29 VITALS — BP 108/70 | HR 68 | Wt 167.6 lb

## 2022-03-29 DIAGNOSIS — I5022 Chronic systolic (congestive) heart failure: Secondary | ICD-10-CM | POA: Insufficient documentation

## 2022-03-29 NOTE — Patient Instructions (Signed)
Your physician recommends that you schedule a follow-up appointment in: [place]  If you have any questions or concerns before your next appointment please send [place] a message through [place] or call our office at [phone].    TO LEAVE A MESSAGE FOR THE NURSE SELECT OPTION 2, PLEASE LEAVE A MESSAGE INCLUDING: YOUR NAME DATE OF BIRTH CALL BACK NUMBER REASON FOR CALL**this is important as we prioritize the call backs  YOU WILL RECEIVE A CALL BACK THE SAME DAY AS LONG AS YOU CALL BEFORE 4:00 PM

## 2022-04-12 ENCOUNTER — Other Ambulatory Visit: Payer: Self-pay | Admitting: Cardiology

## 2022-04-25 ENCOUNTER — Other Ambulatory Visit: Payer: Self-pay | Admitting: Urology

## 2022-04-25 DIAGNOSIS — N401 Enlarged prostate with lower urinary tract symptoms: Secondary | ICD-10-CM

## 2022-05-23 ENCOUNTER — Other Ambulatory Visit: Payer: Self-pay | Admitting: Internal Medicine

## 2022-06-15 DEATH — deceased

## 2022-06-21 ENCOUNTER — Ambulatory Visit: Payer: Medicare Other | Admitting: Internal Medicine

## 2022-06-21 ENCOUNTER — Other Ambulatory Visit: Payer: Medicare Other

## 2022-06-27 ENCOUNTER — Ambulatory Visit: Payer: Medicare Other | Admitting: Urology

## 2022-06-28 ENCOUNTER — Ambulatory Visit: Payer: Medicare Other | Admitting: Urology

## 2022-09-20 ENCOUNTER — Ambulatory Visit: Payer: Medicare Other | Admitting: Internal Medicine
